# Patient Record
Sex: Male | Born: 1940 | Race: White | Hispanic: No | Marital: Married | Smoking: Former smoker
Health system: Southern US, Community
[De-identification: ages and names within clinical notes are randomized; demographics above are authoritative.]

## PROBLEM LIST (undated history)

## (undated) DIAGNOSIS — I639 Cerebral infarction, unspecified: Secondary | ICD-10-CM

## (undated) DIAGNOSIS — E785 Hyperlipidemia, unspecified: Secondary | ICD-10-CM

## (undated) DIAGNOSIS — C801 Malignant (primary) neoplasm, unspecified: Secondary | ICD-10-CM

## (undated) DIAGNOSIS — I4819 Other persistent atrial fibrillation: Secondary | ICD-10-CM

## (undated) DIAGNOSIS — J449 Chronic obstructive pulmonary disease, unspecified: Secondary | ICD-10-CM

## (undated) DIAGNOSIS — N400 Enlarged prostate without lower urinary tract symptoms: Secondary | ICD-10-CM

## (undated) DIAGNOSIS — K219 Gastro-esophageal reflux disease without esophagitis: Secondary | ICD-10-CM

## (undated) DIAGNOSIS — F419 Anxiety disorder, unspecified: Secondary | ICD-10-CM

## (undated) DIAGNOSIS — I779 Disorder of arteries and arterioles, unspecified: Secondary | ICD-10-CM

## (undated) DIAGNOSIS — L749 Eccrine sweat disorder, unspecified: Secondary | ICD-10-CM

## (undated) DIAGNOSIS — H02402 Unspecified ptosis of left eyelid: Secondary | ICD-10-CM

## (undated) DIAGNOSIS — R9430 Abnormal result of cardiovascular function study, unspecified: Secondary | ICD-10-CM

## (undated) DIAGNOSIS — H269 Unspecified cataract: Secondary | ICD-10-CM

## (undated) DIAGNOSIS — Z8719 Personal history of other diseases of the digestive system: Secondary | ICD-10-CM

## (undated) DIAGNOSIS — I2510 Atherosclerotic heart disease of native coronary artery without angina pectoris: Secondary | ICD-10-CM

## (undated) DIAGNOSIS — Z9989 Dependence on other enabling machines and devices: Secondary | ICD-10-CM

## (undated) DIAGNOSIS — I739 Peripheral vascular disease, unspecified: Secondary | ICD-10-CM

## (undated) DIAGNOSIS — IMO0002 Reserved for concepts with insufficient information to code with codable children: Secondary | ICD-10-CM

## (undated) DIAGNOSIS — J189 Pneumonia, unspecified organism: Secondary | ICD-10-CM

## (undated) DIAGNOSIS — E559 Vitamin D deficiency, unspecified: Secondary | ICD-10-CM

## (undated) HISTORY — DX: Benign prostatic hyperplasia without lower urinary tract symptoms: N40.0

## (undated) HISTORY — DX: Other persistent atrial fibrillation: I48.19

## (undated) HISTORY — PX: CATARACT EXTRACTION, BILATERAL: SHX1313

## (undated) HISTORY — DX: Atherosclerotic heart disease of native coronary artery without angina pectoris: I25.10

## (undated) HISTORY — DX: Vitamin D deficiency, unspecified: E55.9

## (undated) HISTORY — DX: Chronic obstructive pulmonary disease, unspecified: J44.9

## (undated) HISTORY — DX: Disorder of arteries and arterioles, unspecified: I77.9

## (undated) HISTORY — DX: Reserved for concepts with insufficient information to code with codable children: IMO0002

## (undated) HISTORY — DX: Abnormal result of cardiovascular function study, unspecified: R94.30

## (undated) HISTORY — DX: Unspecified ptosis of left eyelid: H02.402

## (undated) HISTORY — DX: Peripheral vascular disease, unspecified: I73.9

## (undated) HISTORY — DX: Personal history of other diseases of the digestive system: Z87.19

## (undated) HISTORY — DX: Cerebral infarction, unspecified: I63.9

## (undated) HISTORY — DX: Eccrine sweat disorder, unspecified: L74.9

## (undated) HISTORY — DX: Hyperlipidemia, unspecified: E78.5

## (undated) HISTORY — DX: Unspecified cataract: H26.9

## (undated) HISTORY — PX: APPENDECTOMY: SHX54

## (undated) HISTORY — DX: Anxiety disorder, unspecified: F41.9

## (undated) HISTORY — DX: Gastro-esophageal reflux disease without esophagitis: K21.9

---

## 2005-01-15 ENCOUNTER — Ambulatory Visit: Payer: Self-pay | Admitting: Internal Medicine

## 2005-01-23 ENCOUNTER — Ambulatory Visit: Payer: Self-pay | Admitting: Internal Medicine

## 2005-01-23 ENCOUNTER — Ambulatory Visit (HOSPITAL_COMMUNITY): Admission: RE | Admit: 2005-01-23 | Discharge: 2005-01-23 | Payer: Self-pay | Admitting: Internal Medicine

## 2007-12-11 ENCOUNTER — Ambulatory Visit: Payer: Self-pay | Admitting: Cardiology

## 2007-12-23 ENCOUNTER — Ambulatory Visit: Payer: Self-pay | Admitting: Cardiology

## 2007-12-23 ENCOUNTER — Encounter: Payer: Self-pay | Admitting: Cardiology

## 2008-01-01 ENCOUNTER — Ambulatory Visit: Payer: Self-pay | Admitting: Cardiology

## 2008-01-09 ENCOUNTER — Ambulatory Visit: Payer: Self-pay | Admitting: Cardiology

## 2008-01-15 ENCOUNTER — Ambulatory Visit: Payer: Self-pay | Admitting: Cardiology

## 2008-01-23 ENCOUNTER — Ambulatory Visit: Payer: Self-pay | Admitting: Cardiology

## 2008-01-27 ENCOUNTER — Ambulatory Visit: Payer: Self-pay | Admitting: Cardiology

## 2008-02-05 ENCOUNTER — Ambulatory Visit: Payer: Self-pay | Admitting: Cardiology

## 2008-02-11 ENCOUNTER — Ambulatory Visit: Payer: Self-pay | Admitting: Cardiology

## 2008-02-16 ENCOUNTER — Encounter: Payer: Self-pay | Admitting: Cardiology

## 2008-02-16 ENCOUNTER — Ambulatory Visit: Payer: Self-pay | Admitting: Cardiology

## 2008-02-17 ENCOUNTER — Encounter: Payer: Self-pay | Admitting: Cardiology

## 2008-02-20 ENCOUNTER — Ambulatory Visit: Payer: Self-pay | Admitting: Cardiology

## 2008-02-25 ENCOUNTER — Inpatient Hospital Stay (HOSPITAL_BASED_OUTPATIENT_CLINIC_OR_DEPARTMENT_OTHER): Admission: RE | Admit: 2008-02-25 | Discharge: 2008-02-25 | Payer: Self-pay | Admitting: Cardiovascular Disease

## 2008-02-25 ENCOUNTER — Ambulatory Visit: Payer: Self-pay | Admitting: Cardiovascular Disease

## 2008-03-02 ENCOUNTER — Ambulatory Visit: Payer: Self-pay | Admitting: Cardiology

## 2008-03-09 ENCOUNTER — Ambulatory Visit: Payer: Self-pay | Admitting: Cardiology

## 2008-03-23 ENCOUNTER — Ambulatory Visit: Payer: Self-pay | Admitting: Cardiology

## 2008-04-06 ENCOUNTER — Ambulatory Visit: Payer: Self-pay | Admitting: Cardiology

## 2008-04-14 ENCOUNTER — Ambulatory Visit: Payer: Self-pay | Admitting: Cardiology

## 2008-05-17 ENCOUNTER — Ambulatory Visit: Payer: Self-pay | Admitting: Cardiology

## 2008-10-04 ENCOUNTER — Encounter: Payer: Self-pay | Admitting: Physician Assistant

## 2008-10-04 ENCOUNTER — Encounter: Payer: Self-pay | Admitting: Cardiology

## 2008-11-30 ENCOUNTER — Ambulatory Visit: Payer: Self-pay | Admitting: Cardiology

## 2009-04-18 ENCOUNTER — Encounter: Payer: Self-pay | Admitting: Cardiology

## 2009-09-28 ENCOUNTER — Encounter: Payer: Self-pay | Admitting: Cardiology

## 2009-10-20 ENCOUNTER — Encounter: Payer: Self-pay | Admitting: Cardiology

## 2009-11-01 ENCOUNTER — Encounter: Payer: Self-pay | Admitting: Cardiology

## 2009-11-01 DIAGNOSIS — I4891 Unspecified atrial fibrillation: Secondary | ICD-10-CM | POA: Insufficient documentation

## 2009-11-01 DIAGNOSIS — K219 Gastro-esophageal reflux disease without esophagitis: Secondary | ICD-10-CM | POA: Insufficient documentation

## 2009-11-01 DIAGNOSIS — E785 Hyperlipidemia, unspecified: Secondary | ICD-10-CM

## 2009-11-01 HISTORY — DX: Hyperlipidemia, unspecified: E78.5

## 2009-11-02 ENCOUNTER — Ambulatory Visit: Payer: Self-pay | Admitting: Cardiology

## 2010-02-16 DIAGNOSIS — I639 Cerebral infarction, unspecified: Secondary | ICD-10-CM

## 2010-02-16 HISTORY — DX: Cerebral infarction, unspecified: I63.9

## 2010-03-06 ENCOUNTER — Ambulatory Visit: Payer: Self-pay | Admitting: Cardiology

## 2010-07-18 NOTE — Miscellaneous (Signed)
  Clinical Lists Changes  Problems: Added new problem of DYSLIPIDEMIA (ICD-272.4) Added new problem of GERD (ICD-530.81) Added new problem of * ANXIETY Added new problem of * PTOSIS  LEFT EYE Added new problem of ATRIAL FIBRILLATION, CHRONIC (ICD-427.31) Added new problem of CAD (ICD-414.00) Added new problem of * NIACIN REACTION Observations: Added new observation of PRIMARY MD: Wyvonnia Lora, MD (11/01/2009 18:25) Added new observation of PAST MED HX: hyperlipidemia.  Gastroesophageal reflux disease. Benign prostatic hypertrophy. anxiety disorder in the past. appendectomy. surgery for ptosis of the left eye. Status post bilateral cataracts. Atrial fibrillation.  He had been cardioverted on February 05, 2008     and did go to sinus rhythm, but returned to atrial fibrillation by     the time I saw him on February 20, 2008 EF  60%  range cardiac catheterization...2009... mild coronary artery disease. Episodes of sweating .Marland Kitchen...improved with discontinuation of niacin....Marland KitchenMarland Kitchen he has some type of delayed reactivity to niacin. Mild headaches that are frontal.   (11/01/2009 18:25)       Past History:  Past Medical History: hyperlipidemia.  Gastroesophageal reflux disease. Benign prostatic hypertrophy. anxiety disorder in the past. appendectomy. surgery for ptosis of the left eye. Status post bilateral cataracts. Atrial fibrillation.  He had been cardioverted on February 05, 2008     and did go to sinus rhythm, but returned to atrial fibrillation by     the time I saw him on February 20, 2008 EF  60%  range cardiac catheterization...2009... mild coronary artery disease. Episodes of sweating .Marland Kitchen...improved with discontinuation of niacin....Marland KitchenMarland Kitchen he has some type of delayed reactivity to niacin. Mild headaches that are frontal.

## 2010-07-18 NOTE — Assessment & Plan Note (Signed)
Summary: 1 YR FU - NEEDS TO UPDATE MEDICATION   Visit Type:  Follow-up Primary Provider:  Wyvonnia Lora, MD  CC:  atrial fibrillation.  History of Present Illness: The patient is seen for atrial fibrillation followup.  In the past he had been cardioverted.  He returned to atrial fibrillation and we decided to leave him in atrial fibrillation.  His rate is controlled.  He can be fully active when he wants to.  He does not have chest pain or shortness of breath.  His CHADS2 score is zero.  He is not on Coumadin.  In general he does well.  Some of his motivation to be active has decreased.  This does not sound cardiac in origin.  Preventive Screening-Counseling & Management  Alcohol-Tobacco     Smoking Status: quit     Year Quit: 1993  Current Medications (verified): 1)  Metoprolol Tartrate 50 Mg Tabs (Metoprolol Tartrate) .... Take 1 1/2 Tablet By Mouth Twice A Day 2)  Crestor 20 Mg Tabs (Rosuvastatin Calcium) .... Take One Tablet By Mouth Every Night. 3)  Caltrate 600 1500 Mg Tabs (Calcium Carbonate) .... Take 2 Tablet By Mouth Once A Day 4)  Vitamin D3 1000 Unit Caps (Cholecalciferol) .... Take 1 Tablet By Mouth Once A Day 5)  Aspir-Trin 325 Mg Tbec (Aspirin) .... Take 1 Tablet By Mouth Once A Day 6)  Multivitamins  Tabs (Multiple Vitamin) .... Take 1 Tablet By Mouth Once A Day 7)  Fish Oil 1000 Mg Caps (Omega-3 Fatty Acids) .... Take 2 Tablet By Mouth Once A Day 8)  Trazodone Hcl 50 Mg Tabs (Trazodone Hcl) .... Take 1 Tablet By Mouth Once A Day 9)  Zyrtec Allergy 10 Mg Caps (Cetirizine Hcl) .... Take 1 Tablet By Mouth Once A Day  Allergies: 1)  ! Niacin  Comments:  Nurse/Medical Assistant: The patient's medications and allergies were reviewed with the patient and were updated in the Medication and Allergy Lists. List reviewed.  Past History:  Past Medical History: Last updated: 11/01/2009 hyperlipidemia.  Gastroesophageal reflux disease. Benign prostatic  hypertrophy. anxiety disorder in the past. appendectomy. surgery for ptosis of the left eye. Status post bilateral cataracts. Atrial fibrillation.  He had been cardioverted on February 05, 2008     and did go to sinus rhythm, but returned to atrial fibrillation by     the time I saw him on February 20, 2008 EF  60%  range cardiac catheterization...2009... mild coronary artery disease. Episodes of sweating .Marland Kitchen...improved with discontinuation of niacin....Marland KitchenMarland Kitchen he has some type of delayed reactivity to niacin. Mild headaches that are frontal.   Social History: Smoking Status:  quit  Review of Systems       Patient denies fever, chills, headache, sweats, rash, change in vision, change in hearing, chest pain, cough, nausea vomiting, urinary symptoms.  All other systems are reviewed and are negative.  Vital Signs:  Patient profile:   70 year old male Height:      66 inches Weight:      175 pounds BMI:     28.35 Pulse rate:   96 / minute BP sitting:   145 / 81  (left arm) Cuff size:   regular  Vitals Entered By: Carlye Grippe (Nov 02, 2009 9:49 AM)  Nutrition Counseling: Patient's BMI is greater than 25 and therefore counseled on weight management options. CC: atrial fibrillation   Physical Exam  General:  patient is stable in general. Head:  head is atraumatic. Eyes:  no  xanthelasma. Neck:  no jugulovenous stenting.  No carotid bruits. Chest Wall:  no chest wall tenderness. Lungs:  lung exam reveals diminished breath sounds. Heart:  cardiac exam reveals an S1-S2.  The rhythm is irregularly irregular. Abdomen:  abdomen is soft. Msk:  no musculoskeletal deformities. Extremities:  no peripheral edema. Skin:  no skin rashes. Psych:  patient is oriented to person time and place.  Affect is normal.   Impression & Recommendations:  Problem # 1:  * NIACIN REACTION The patient had a significant reaction sweating to niacin in the past.  The niacin was stopped and he feels well.  He  does not have any medication now for niacin treatment  Problem # 2:  CAD (ICD-414.00)  His updated medication list for this problem includes:    Metoprolol Tartrate 50 Mg Tabs (Metoprolol tartrate) .Marland Kitchen... Take 1 1/2 tablet by mouth twice a day    Aspir-trin 325 Mg Tbec (Aspirin) .Marland Kitchen... Take 1 tablet by mouth once a day  Orders: EKG w/ Interpretation (93000) Patient has mild coronary disease by history.  EKG is done today and reviewed by me.  His atrial fib rate is mildly increased.  He has been out of his beta blocker for 2 days.  There are nonspecific ST-T wave changes.  No further workup is needed other than getting him back on his medications.  Problem # 3:  ATRIAL FIBRILLATION, CHRONIC (ICD-427.31) The patient's atrial fib rate will be controlled when he's back on his beta blocker.  I carefully reviewed again whether he should be on Coumadin.  His CHADS2 score is zero.  No Coumadin recommended at this time.  Problem # 4:  * ANXIETY The patient has decreased motivation at times.  There is a history of some anxiety.  I wonder if some mild depression.  He will followup with his primary team concerning this.  Problem # 5:  DYSLIPIDEMIA (ICD-272.4)  His updated medication list for this problem includes:    Crestor 20 Mg Tabs (Rosuvastatin calcium) .Marland Kitchen... Take one tablet by mouth every night. Patient's lipids are nicely controlled from the labs available to me.  No change in therapy.  Patient Instructions: 1)  Your physician wants you to follow-up in: 1 year. You will receive a reminder letter in the mail one-two months in advance. If you don't receive a letter, please call our office to schedule the follow-up appointment. 2)  Your physician recommends that you continue on your current medications as directed. Please refer to the Current Medication list given to you today. Prescriptions: METOPROLOL TARTRATE 50 MG TABS (METOPROLOL TARTRATE) Take 1 1/2 tablet by mouth twice a day  #90 Tablet x  12   Entered by:   Cyril Loosen, RN, BSN   Authorized by:   Talitha Givens, MD, [facility]   Signed by:   Cyril Loosen, RN, BSN on 11/02/2009   Method used:   Electronically to        [facility]* (retail)       [address]       [place], [place]  [address]       Ph: [phone]       Fax: [phone]   RxID:   [id number]

## 2010-07-18 NOTE — Letter (Signed)
Summary: Appointment- Rescheduled  [facility] [address]   [place], [place] [address]   Phone: [phone]  Fax: [phone]     Oct 20, 2009 MRN: [id number]     ZACCHEAUS STORLIE [address] [place], [place]  [address]     Dear Mr. Gow,   Due to a change in our office schedule, your appointment on  May 18th, 2011 at  3:00pm must be changed.   Your new appointment will be May 18th, 2011 at 10:00 am.  We look forward to participating in your health care needs.       Sincerely,  Glass blower/designer

## 2010-10-31 NOTE — Assessment & Plan Note (Signed)
[facility]                          [facility] OFFICE NOTE   Rayvon, Dakin JASTON HAVENS                      MRN:          [id number]  DATE:02/20/2008                            DOB:          [birth year]    Mr. Pagnotta is here post hospitalization.  I had followed him for atrial  fibrillation.  We had done a cardioversion on February 05, 2008 and he  converted.  The patient, however, was then hospitalized on February 17, 2008.  He had some left chest discomfort and was found to have returned  to atrial fib.  He remained in atrial fib.  His enzymes were negative.  Decision was made to proceed with nuclear exercise testing.  The nuclear  scan was done on February 17, 2008.  Study showed that the patient has  an inferior defect that was partially reversible.  Ejection fraction was  normal.  It was felt that this was compatible with some ischemia and  scar.   I have seen Mr. Hardgrove back today in the office.  He had been told we  would probably recommend catheterization and he is ready to go ahead.  I  had a full and complete discussion with the patient and his wife about  the risks and benefits of catheterization.  We know from before that  there was question that I thought he might have slight hypokinesis at  the base of the inferior wall.  With this in mind and with the more  recent events it is time to proceed with cardiac catheterization.   The patient has sweating episodes.  The etiology is not clear.  I am not  convinced this represents ischemia.  He has also had some headaches  which are frontal.  It would be unusual for this to be angina but not  impossible.   ALLERGIES:  No known drug allergies.   MEDICATIONS:  1. Niacin 500.  2. Vitamin D.  3. Calcium.  4. Aspirin 325 daily.  5. Trazodone.  6. Metoprolol 50 b.i.d.  7. Coumadin that has now been stopped.   OTHER MEDICAL PROBLEMS:  See the list below.   REVIEW OF SYSTEMS:  Other than  the HPI, his review of systems today is  negative.   PHYSICAL EXAM:  VITAL SIGNS:  His blood pressure is 115/72 with a pulse  of 88.  Weight is 181 pounds.  GENERAL:  The patient is oriented to person, time and place.  Affect is  normal.  HEENT:  Reveals no xanthelasma.  He has normal extraocular motion.  There are no carotid bruits.  There is no jugular venous distention.  LUNGS:  Lungs are clear.  Respiratory effort is not labored.  CARDIAC:  Exam reveals an S1 with an S2.  There are no clicks or  significant murmurs.  ABDOMEN:  The abdomen is soft.  There is no peripheral edema.   EKG today reveals that he still is in atrial fib.  His rate is  controlled at 82.  There are nonspecific ST-T wave changes.   PROBLEMS:  1. Include history of hyperlipidemia.  Up to this point there has been      question that he does not tolerate statins well.  This will have to      be rereviewed after we know more about his coronary arteries.  2. GERD.  3. BPH.  4. History of some anxiety disorder.  5. Status post appendectomy.  6. Status post surgery for ptosis of the left eye.  7. Status post bilateral cataracts.  8. Atrial fibrillation.  We had cardioverted him on 08/20 and he went      to sinus rhythm but returned to atrial fib.  We will address this      issue more in the future.  His CHADS score is low therefore it is      safe for him to be off Coumadin regardless.  9. History of ejection fraction in the 60% range.  10.Question of slight hypokinesis at the base of the inferior wall by      echo.  More recently he had some chest discomfort and the nuclear      scan raised the question of some scar or ischemia at the base of      the inferior wall.  It is now appropriate to proceed with      catheterization.  Catheterization will be arranged.  I will then      see him back in [place] so that we can further assess his issues.  11.Some episodes of sweating.  Exact etiology is not clear.  12.Mild  headaches that are frontal.  Etiology is not clear at this      point.   Catheterization is being arranged.     Luis Abed, MD, [facility]  Electronically Signed    JDK/MedQ  DD: 02/20/2008  DT: 02/20/2008  Job #: [id number]   cc:   Wyvonnia Lora

## 2010-10-31 NOTE — Cardiovascular Report (Signed)
[name], [name] NO.:  [id number]   MEDICAL RECORD NO.:  [id number]          PATIENT TYPE:  OIB   LOCATION:  1963                         FACILITY:  [facility]   PHYSICIAN:  Veverly Fells. Excell Seltzer, MD  DATE OF BIRTH:  Apr 11, 1941   DATE OF PROCEDURE:  02/25/2008  DATE OF DISCHARGE:  02/25/2008                            CARDIAC CATHETERIZATION   PROCEDURE:  Left heart catheterization, selective coronary angiography,  and left ventricular angiography.   INDICATIONS:  [name] is a 70 year old gentleman with chronic atrial  fibrillation.  He has failed recent cardioversion.  He complained of  left-sided chest pain and underwent an adenosine Myoview scan that  suggested inferior wall ischemia.  He was referred for cardiac  catheterization.   Risks and indications of the procedure were reviewed in detail and  informed consent was obtained.  The right groin was prepped, draped, and  anesthetized with 1% lidocaine.  Using modified Seldinger technique, a 4-  French sheath was placed in the right femoral artery.  Standard 4-French  Judkins catheters were used for coronary angiography and left  ventriculography.  All catheter exchanges were performed over a  guidewire.  There were no immediate complications.  The patient  tolerated the procedure well.   FINDINGS:  The aortic pressure 98/71 with a mean of 83, left ventricular  pressure 99/20.   Coronary angiography.  The left mainstem is very short.  There is no  stenosis present.   The LAD is a large-caliber vessel that courses down and reaches the LV  apex.  The anatomy is a bit unusual as there is a branch off the  proximal LAD that runs the course of an intermediate.  It supplies  multiple branch vessels to the lateral wall.  There has been more of a  traditional appearing diagonal branch from further down in the mid-LAD.  The LAD at the origin of the second diagonal branch has a 30% stenosis.  Further down in the  vessel, there is minor nonobstructive plaque but no  significant stenosis in the LAD or its branch vessels.  There are two  diagonals in the intermediate territory branch but all arising from the  proximal and mid LAD.   The left circumflex is very small.  It courses down the AV groove and  supplies no significant branch vessels.   Right coronary artery.  The right coronary artery is dominant.  Proximally, there is an eccentric 50% stenosis.  This is a smooth  appearing lesion of the vessels.  The midportion of the right coronary  artery is dilated.  There is a transition zone into more normal-  appearing vessel where there is a 30% stenosis.  There is a large acute  marginal branch and a moderate size PDA and posterolateral branch.  There are no other significant stenoses noted.  With intracoronary  nitroglycerin, the lesion still appears no worse than 50%.   Left ventriculography shows low normal LV function with an LVEF in the  range of 50-55%.  There was no significant mitral regurgitation.   ASSESSMENT:  1. Mild-to-moderate right coronary  artery stenosis as outlined above.  2. Minimal left anterior descending and left circumflex stenosis.  3. Normal left ventricular function.   PLAN:  Recommend medical therapy for nonobstructive CAD.  The patient  will resume Coumadin tonight.  He will drop his aspirin dose to 81 mg.  In reviewing Dr. Myrtis Ser notes, the patient may not require long-term  Coumadin.  Recommend aggressive secondary risk reduction for the  patient's diffuse nonobstructive CAD.      Veverly Fells. Excell Seltzer, MD  Electronically Signed     MDC/MEDQ  D:  02/25/2008  T:  02/26/2008  Job:  [id number]   cc:   Luis Abed, MD, [facility]  Wyvonnia Lora

## 2010-10-31 NOTE — Assessment & Plan Note (Signed)
[facility]                          [facility] OFFICE NOTE   Sean, [name]                      MRN:          [id number]  DATE:12/11/2007                            DOB:          1940-07-13    REFERRING PHYSICIAN:  Wyvonnia Lora, M.D.   REASON FOR REFERRAL:  Atrial fibrillation.   HISTORY OF PRESENT ILLNESS:  [name] is a 70 year old male patient  with no previously documented heart disease who presents to our office  today for further evaluation of atrial fibrillation.  He presented to  Dr. Jackolyn Confer office couple of months ago for routine physical  exanimation and was found to be in atrial fibrillation with the heart  rate in the 130s.  He was placed on Lopressor and Coumadin.  The patient  was totally asymptomatic with his atrial fibrillation.  He denies any  complications or shortness of breath.  He denies any presyncope or  syncope.  He denies orthopnea, PND, or pedal edema.  He denies any chest  discomfort.  He is quite active and describes NYHA class II  symptoms.   PAST MEDICAL HISTORY:  1. Hyperlipidemia.      a.     He was intolerant to Lipitor in the past as well as Zetia.       He does note that he was able to tolerate Zocor for quite       sometime, but had to change to Lipitor at one point secondary to       insurance reasons.  2. GERD.  3. BPH.  4. Anxiety disorder.  5. Status post appendectomy.  6. Status post surgery for ptosis of his left eye.  7. History of bilateral cataract surgery.   MEDICATIONS:  1. Niacin 500 mg daily.  2. Vitamin D 1000 IU daily.  3. Allergy relief 10 mg daily.  4. Calcium, magnesium and zinc daily.  5. Aspirin 81 mg daily.  6. Trazodone 50 mg nightly.  7. Metoprolol 50 mg b.i.d.  8. Warfarin as directed.  9. Prilosec p.r.n.   ALLERGIES:  No known drug allergies.   SOCIAL HISTORY:  The patient is an ex-smoker.  He smoked 2-1/2 pack to 4  packs of cigarettes per day for 46 years  and quit in 1993.  He did start  it when he was age 33.  He denies any significant alcohol abuse at this  time.  Denies drug abuse.  He is retired from trucking.  He is married  and has 5 children.   FAMILY HISTORY:  Insignificant for premature CAD.   REVIEW OF SYSTEMS:  Please see HPI.  Denies any fevers or chills.  He  did have a recent URI.  He denies any significant cough now.  He denies  melena, hematochezia, or hematuria.  Rest of the review of systems are  negative.   PHYSICAL EXAMINATION:  GENERAL:  He is a well-nourished, well-developed  male, in no acute distress.  VITAL SIGNS:  Blood pressure is 132/82, pulse 63, and weight 180.9  pounds.  HEENT:  Normal.  NECK:  Without JVD.  Lymph without lymphadenopathy.  Endocrine without  thyromegaly.  CARDIAC:  Normal S1 and S2.  Irregularly irregular rhythm.  LUNGS:  Clear to auscultation bilaterally.  ABDOMEN:  Soft and nontender with normoactive bowel sounds.  No  organomegaly.  No bruits.  EXTREMITIES:  Without edema.  Dorsalis pedis and posterior and posterior  tibialis pulses bilaterally.  NEUROLOGIC:  He is alert and oriented x3. Cranial nerves II-XII are  grossly intact.  BACK:  Without carotid bruits bilaterally.  SKIN:  Warm and dry.   STUDIES:  Electrocardiogram reveals atrial fibrillation with a heart  rate of 94, normal axis, no acute changes.   LABORATORY DATA:  TSH form October 14, 2007, 2.766.   IMPRESSION:  1. Atrial fibrillation.      a.     CHADS2 score of  0.      b.     Coumadin therapy followed by Dr. Margo Common.  2. Dyslipidemia.  3. Gastroesophageal reflux disease.  4. Ex-smoker.   PLAN:  1. The patient presents to the office today for further evaluation of      atrial fibrillation.  He is currently on Coumadin therapy.  His      CHADS2 score is  0.  Given his lack of thromboembolic risk factors,      he does not require long-term Coumadin.  However, it would be      worthwhile to attempt  cardioversion to restore normal sinus rhythm.      We will obtain his prior INRs and follow him over the next several      weeks to make sure he has therapeutic INRs for at least 3 weeks in      a row.  Once he completes that, we will set him up for an elective      cardioversion as an outpatient.  I discussed this with Dr. Myrtis Ser      also, who saw the patient today.  He agreed with this plan.  2. The patient will have an echocardiogram to assess cardiac structure      and function.  3. His metoprolol will be increased to 75 mg b.i.d. to achieve better      heart rate control.  4. The patient requests that he be placed back on statin therapy.  He      was able to tolerate Zocor in the past and would like to try      generic simvastatin.  We will place him on simvastatin 20 mg      nightly and recheck a lipid profile in 12 weeks.  5. The patient will be back for followup in the next 3-4 weeks just      prior to his planned cardioversion.      Sean Newcomer, PA-C  Electronically Signed      Luis Abed, MD, [facility]  Electronically Signed   SW/MedQ  DD: 12/11/2007  DT: 12/12/2007  Job #: [id number]   cc:   Wyvonnia Lora

## 2010-10-31 NOTE — Assessment & Plan Note (Signed)
[facility]                          [facility] NOTE   NAME:[name], [name]                      MRN:          [id number]  DATE:04/14/2008                            DOB:          06/29/1940    HISTORY OF PRESENT ILLNESS:  [name] is here for Cardiology  followup.  He has atrial fibrillation.  He also has hyperlipidemia.  We  were concerned about abnormal echo with some inferior hypokinesis that  was mild and a nuclear scan that was mildly abnormal and he underwent  catheterization.  The cath was done by Dr. Excell Seltzer, and it revealed only  mild-to-moderate atherosclerosis of the right coronary artery, but there  was no major obstructive lesion.  His ejection fraction was 55%.  He has  been stable.  He had had atrial fib, and we converted him to sinus, but  then he returned to atrial fib.  In the meantime, he has remained on  Coumadin.   Overall, he is doing well.  However, he describes episodes of sudden  onset of a feeling of warmth.  He then is sweaty for 2 or 3 minutes.  His wife states that he looks somewhat ashen, but he does not feel  poorly with it.  The episodes pass and he goes about his activities.  He  has had this before over many years.  He thinks it is more frequent now.  He does not sense any palpitations.  However, he has never had  significant palpitations previously from his atrial fib.   PAST MEDICAL HISTORY:   ALLERGIES:  No known drug allergies.   MEDICATIONS:  Vitamin D, calcium, aspirin, Loratadine, Niacin, and  Zocor.  Niacin dose was changed recently.  He has been on it in the  past.  It does not sound like the classic response, but we will start by  seeing how he feels off niacin.  He is on Zocor.   OTHER MEDICAL PROBLEMS:  See the list on my note of February 20, 2008.   REVIEW OF SYSTEMS:  He has no GI or GU symptoms.  Otherwise, his review  of systems is negative.   PHYSICAL EXAMINATION:  VITAL SIGNS:   Blood pressure is 116/64 with a  pulse of 84.  GENERAL:  The patient is oriented to person, time and place.  Affect is  normal.  HEENT:  No xanthelasma.  He has normal extraocular motion.  NECK:  There are no carotid bruits.  There is no jugular venous  distention.  LUNGS:  Clear.  Respiratory effort is not labored.  CARDIAC:  S1 with an S2.  The rhythm is irregular.  ABDOMEN:  Soft.  EXTREMITIES:  He has no peripheral edema.   PROBLEMS:  1. History of hyperlipidemia.  I am going to ask him to stop his      niacin to see how he feels.  He is on a statin.  2. Gastroesophageal reflux disease.  3. Benign prostatic hypertrophy.  4. History of an anxiety disorder in the past.  5. Status post appendectomy.  6. 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  7. Status post bilateral cataracts.  8. Atrial fibrillation.  He had been cardioverted on February 05, 2008      and did go to sinus rhythm, but returned to atrial fibrillation by      the time I saw him on February 20, 2008.  9. Coumadin therapy.  We are keeping him on Coumadin at this point.  10.History of ejection fraction in the 60% range.  11.Recent cardiac catheterization with mild coronary artery disease.  12.Episodes of sweating.  These are worse than the past.  We will      start by holding his niacin.  13.Mild headaches that are frontal.   First step would be to hold his niacin.  I will then see him back.  After that, we will consider event recorder to see if he has any marked  increase in his atrial fib rate when he has these symptoms.     Luis Abed, MD, [facility]  Electronically Signed    JDK/MedQ  DD: 04/14/2008  DT: 04/15/2008  Job #: [id number]   cc:   Wyvonnia Lora

## 2010-10-31 NOTE — Assessment & Plan Note (Signed)
[name]                          [facility] NOTE   [name], [name]                      MRN:          [id number]  DATE:03/09/2008                            DOB:          1941/01/17    PRIMARY CARDIOLOGIST:  Luis Abed, MD, [facility]   PRIMARY CARE PHYSICIAN:  Dr. Wyvonnia Lora   REASON FOR VISIT:  Post cardiac catheterization followup.   HISTORY OF PRESENT ILLNESS:  [name] was seen recently on September  4 by Dr. Myrtis Ser and was scheduled for a diagnostic cardiac catheterization  following a recent mildly abnormal Cardiolite.  This study was performed  in early September in the setting of recurrent atrial fibrillation  following an initially successful cardioversion attempt and also dyspnea  on exertion.  He had had an echocardiogram done previously demonstrated  an ejection fraction of 55-60% with basal inferolateral hypokinesis.  His Cardiolite study was abnormal in similar distribution revealing a  partially reversible inferior defect with abnormal ST-segment changes  and an ejection fraction of 67%.  [name] was taken off of Coumadin  and referred for cardiac catheterization done by Dr. Excell Seltzer which  revealed mild-to-moderate atherosclerosis within the right coronary  artery, although no major obstructive lesion was seen.  Otherwise, there  was minimal atherosclerosis and an overall normal ejection fraction of  50-55% with no significant mitral regurgitation.   [name] comes into the office today for followup in Dr. Myrtis Ser  absence.  He denies having any anginal chest pain.  I tried to get a  sense about his symptoms following cardioversion and he seems to relate  some improvement in dyspnea on exertion just following his restoration  of sinus rhythm, although he has been in persistent atrial fibrillation  since early September and seems to be tolerating it reasonably well.  He  does have a mild sense of dyspnea on  exertion, NYHA class II, but no  major sense of palpitations.  He also seems to be tolerating Zocor which  was started back in May despite a prior intolerance to other statin  medications.  He has not had followup lipid profile or liver function  tests as yet.  At the present time, he continues on Coumadin with low-  dose aspirin and has had no major bleeding problems.   ALLERGIES:  No known drug allergies.   PRESENT MEDICATIONS:  1. Loratadine 10 mg p.o. daily.  2. Aspirin 81 mg p.o. daily.  3. Zocor 20 mg p.o. nightly.  4. Lopressor 75 mg p.o. b.i.d.  5. Coumadin as directed by the [facility].  6. Trazodone 50 mg p.o. nightly.  7. Calcium supplements.  8. Niacin 500 mg p.o. daily.  9. Vitamin D 1000 international units daily.   REVIEW OF SYSTEMS:  As per history of present illness.  Otherwise  negative.   PHYSICAL EXAMINATION:  VITAL SIGNS:  Blood pressure is 135/86, heart  rate is 84, weight is 183 pounds.  GENERAL:  The patient is comfortable in no acute distress.  HEENT:  Conjunctiva is normal.  Oropharynx is clear.  NECK:  Supple.  No elevated jugular venous pressure.  No audible bruits.  No thyromegaly is noted.  LUNGS:  Clear without labored breathing at rest.  CARDIAC:  An irregularly irregular rhythm without S3 gallop.  ABDOMEN:  Soft, nontender, and nondistended with normoactive bowel  sounds.  EXTREMITIES:  Exhibit no cyanosis or pitting edema.  Distal pulses are 1  to 2+.  SKIN:  Warm and dry.  MUSCULOSKELETAL:  No evidence of kyphosis.  NEUROPSYCHIATRIC:  The patient is alert and oriented x3.  Affect is  appropriate.   Electrocardiogram shows atrial fibrillation with nonspecific T-wave  changes, heart rate of 85 beats per minute.   IMPRESSION AND RECOMMENDATIONS:  1. Persistent atrial fibrillation with reasonably controlled heart      rate and seemingly non-limiting symptoms at this time.  [name]      is status post cardioversion attempt done in  late August which was      initially successful.  He continues on low-dose aspirin and      Coumadin at this point.  Options would include leaving him in      atrial fibrillation with a strategy of heart rate control and      anticoagulation as appropriate.  His CHAD2 score was deemed to be 0      and therefore it may well be that he could be taken off of Coumadin      long-term and placed on full dose aspirin.  Alternatively,      antiarrhythmic therapy with a repeat attempt at cardioversion, or      perhaps even down the road an attempt at ablation could be      considered.  Although based on his symptom status at this time, I      certainly did not feel strongly about these options.  I reviewed      these issues with the patient and I will plan to have him come back      to see Dr. Myrtis Ser, his primary cardiologist, to formalize the plan      going forward.  I did not interrupt his Coumadin until this      decision is made.  2. History of hyperlipidemia and mild-to-moderate cardiovascular      disease by cardiac catheterization as outlined above.  Fortunately,      [name] is tolerating generic Zocor.  I will arrange followup      lipid profile and liver function tests.     Jonelle Sidle, MD  Electronically Signed    SGM/MedQ  DD: 03/09/2008  DT: 03/09/2008  Job #: [id number]   cc:   Luis Abed, MD, [facility]  Wyvonnia Lora

## 2010-10-31 NOTE — Assessment & Plan Note (Signed)
[facility]                          [facility] NOTE   [name], [name]                      MRN:          [id number]  DATE:05/17/2008                            DOB:          01-06-1941    [name] is seen for followup.  I saw him on April 14, 2008.  He is  doing well.  He had been having unusual spells of sweating episodes.  He  feels warm and sweaty.  He looks ashen.  He feels poorly with that.  Etiology was not completely clear.  It did not sound like a classic  reaction to niacin.  However, we put his niacin on hold and he has felt  fine ever since.  Therefore, it appears that he is intolerant to NIACIN.   Otherwise, he feels great.  He is not having any significant problems.   It is of note that he is in atrial fibrillation.  I had thought that we  still had him on Coumadin, but in fact it was stopped earlier in the  year.  This was done around the time that we were doing other  evaluations.  I have rereviewed his CHADS score.  We do not have an  absolute indication to restart it, and we will keep him off it for now.  However, when I see him back next visit we will talk about it again.   ALLERGIES:  NIACIN intolerance.   MEDICATIONS:  Zocor, Lopressor, calcium, vitamin D, aspirin to be taken  as 325 mg daily, and loratadine.   OTHER MEDICAL PROBLEMS:  See the complete list on my note of April 14, 2008.   REVIEW OF SYSTEMS:  He is not having any fevers or chills.  He is not  having any GI or GU symptoms.  He has no major muscular aches or pains.  His review of systems otherwise is negative.   PHYSICAL EXAMINATION:  Blood pressure is 137/79.  His pulse is 66.  The  patient is oriented to person, time, and place.  Affect is normal.  HEENT reveals no xanthelasma.  He has normal extraocular motion.  There  are no carotid bruits.  There is no jugular venous distention.  Lungs  are clear.  Respiratory effort is not  labored.  Cardiac exam reveals an  S1 with an S2.  There are no clicks or significant murmurs.  The abdomen  is soft.  He has no peripheral edema.   EKG reveals his atrial fib rate is controlled.   Problems are listed on the note of April 14, 2008.  He is doing well.  It appears that his major symptom from before was related to niacin with  some type of delayed niacin sensitivity.  He will remain off niacin.   I will keep him off the Coumadin at this point.  However, we will  rereview it when I see him back in 6 months.     Luis Abed, MD, [facility]  Electronically Signed    JDK/MedQ  DD: 05/17/2008  DT: 05/18/2008  Job #: [id number]   cc:  Wyvonnia Lora

## 2010-10-31 NOTE — Assessment & Plan Note (Signed)
[facility]                          [facility] NOTE   [name], Follette VANDY TSUCHIYA                      MRN:          [id number]  DATE:11/30/2008                            DOB:          [birth year]    Mr. Korber returns for followup.  He is doing very well.  He had had an  unusual reaction to NIACIN.  He had sweating episodes that seemed quite  unusual, but off the niacin he is doing very well.  He is not having any  chest pain.  He has chronic atrial fibrillation.  After a very careful  review, we decided to keep him off Coumadin as he does not have absolute  indications for this.  He is on aspirin.  We will continue to follow  this issue carefully over time.  He is not having any chest pain.   PAST MEDICAL HISTORY:   ALLERGIES:  He does not tolerate NIACIN.   MEDICATIONS:  Lopressor, calcium, vitamin D, aspirin, omega-3,  trazodone, and Crestor.   OTHER MEDICAL PROBLEMS:  See the list on the note of April 14, 2008.   REVIEW OF SYSTEMS:  He has no fevers or chills.  There are no skin  rashes.  He has mild headaches.  There is no change in his vision.  There is no change in his hearing.  There is no cough.  There is no  chest pain or shortness of breath.  He has no GI or GU symptoms.  All  other systems are reviewed and are negative.   PHYSICAL EXAMINATION:  VITAL SIGNS:  Weight is 173 pounds.  Blood  pressure is 127/83 with a pulse of 65.  GENERAL:  The patient is oriented to person, time, and place.  Affect is  normal.  HEENT:  No xanthelasma.  He has normal extraocular motion.  NECK:  There are no carotid bruits.  There is no jugular venous  distention.  LUNGS:  Clear.  Respiratory effort is not labored.  CARDIAC:  S1 with an S2.  There are no clicks or significant murmurs.  ABDOMEN:  Soft.  EXTREMITIES:  He has no peripheral edema.   PROBLEMS:  Listed on the note of April 14, 2008.  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  #15.  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  He asked if he could eat  garlic for this.  I told him I was not aware that I did not know the  literature on this.  It is not recommended from a cardiovascular  viewpoint.     Luis Abed, MD, [facility]  Electronically Signed    JDK/MedQ  DD: 11/30/2008  DT: 12/01/2008  Job #: [id number]   cc:   Wyvonnia Lora

## 2010-10-31 NOTE — Assessment & Plan Note (Signed)
[facility]                          [facility]   [name], [name]                      MRN:          [id number]  DATE:01/01/2008                            DOB:          Mar 11, 1941    [name] is here for followup.  He has atrial fibrillation.  His [place]  score is 0.  However, we have had him on Coumadin to get him ready for  cardioversion.  I need to recheck the labs, but I believe he has been  well treated and we will look into the timing of when his cardioversion  can be done.   He is not having chest pain or shortness of breath.  He is having  symptoms of vertigo.  He has not had any syncope or presyncope.   PAST MEDICAL HISTORY:   ALLERGIES:  No known drug allergies.   MEDICATIONS:  Niacin, vitamin D, aspirin, trazodone, metoprolol and  warfarin.  This has been overseen by Dr. Margo Common.  We do have many of the  results.   OTHER MEDICAL PROBLEMS:  See the list below.   REVIEW OF SYSTEMS:  As per the HPI.  His vertigo is the major issue.  Otherwise, he is stable.   PHYSICAL EXAMINATION:  VITAL SIGNS:  Blood pressure today is 130/85.  His pulse is 74.  Weight is 179 pounds.  GENERAL:  The patient is oriented to person, time and place.  Affect is  normal.  He is here with his wife today.  HEENT:  No xanthelasma.  He has normal extraocular motion.  NECK:  There are no carotid bruits.  There is no jugular venous  distention.  LUNGS:  Clear.  Respiratory effort is not labored.  CARDIAC:  An S1 with an S2.  The rhythm is irregularly irregular.  ABDOMEN:  Soft.  EXTREMITIES:  He has no significant peripheral edema.   His EKG reveals atrial fibrillation.  When bringing the patient from a  lying to sitting position, he has some dizziness compatible with  vertigo.   PROBLEMS:  1. History of hyperlipidemia.  He is not able to tolerate many      medicines.  2. Gastroesophageal reflux disease.  3. Benign prostatic  hypertrophy.  4. History of an anxiety disorder.  5. Status post appendectomy.  6. Status post surgery for ptosis of the left eye.  7. History of bilateral cataract surgery.  8. Atrial fibrillation.  9. Coumadin therapy, as he is being prepared for his cardioversion.  10.[place] score of 0.  11.2-D echo revealing ejection fraction 55-60%.  12.Question of slight hypokinesis of the base of the inferior wall.      This will be kept in mind.  I do not feel that more aggressive      workup is needed for this at this time.   We will be rechecking his Coumadin status and seeing what we can  provide, proceed with cardioversion.  Also, I will allow him to take  some meclizine at night time to help with his dizziness.     Sean Abed, MD, [facility]  Electronically Signed    JDK/MedQ  DD: 01/01/2008  DT: 01/02/2008  Job #: [id number]   cc:   Wyvonnia Lora

## 2010-11-03 ENCOUNTER — Other Ambulatory Visit: Payer: Self-pay | Admitting: Cardiology

## 2010-11-03 NOTE — Op Note (Signed)
Sean, [name] NO.:  [id number]   MEDICAL RECORD NO.:  [id number]          PATIENT TYPE:  AMB   LOCATION:  DAY                           FACILITY:  [facility]   PHYSICIAN:  Lionel December, M.D.    DATE OF BIRTH:  01-11-1941   DATE OF PROCEDURE:  01/23/2005  DATE OF DISCHARGE:                                 OPERATIVE REPORT   PROCEDURE:  Esophagogastroduodenoscopy.   INDICATIONS:  [name] is a 70 year old Caucasian male with a history of  heartburn for over 40 years who has finally responded to PPI. He also was  treated for H pylori gastritis recently. Given chronicity of his symptoms,  we recommended EGD to make sure he does not have complicated GERD. Procedure  risks were reviewed with the patient, and informed consent was obtained.   PREMEDICATION:  Cetacaine spray for pharyngeal topical anesthesia, Demerol  25 mg IV, Versed 3 mg IV in divided dose.   FINDINGS:  Procedure performed in endoscopy suite. The patient's vital signs  and O2 saturation were monitored during the procedure remained stable. The  patient was placed in left lateral position, and Olympus videoscope was  passed oropharynx without any difficulty into esophagus.   Esophagus. Mucosa of the esophagus was normal at proximal middle segment.  Distally, there was a linear ulcer about 15 mm long and appeared to be  healing. There was another  area with healed ulcer and underlying erythema.  There was scarring and pseudodiverticular formation at distal esophagus just  proximal to GE junction. GE junction was at 34 cm and hiatus was a 37. He  had a relatively short esophagus. There was no stricture or changes to  suggest Barrett's mucosa. Hiatus was at 37. His esophagus was felt to be  rather short.   Stomach. It was empty and distended very well insufflation. Folds of  proximal stomach were normal. Examination mucosa at body, antrum, pyloric  channel as well as angularis, fundus and cardia was  normal.   Duodenum. Bulbar mucosa was normal. Scope was passed to the second part of  the duodenum. Large diverticula were noted along the medial wall, filled  with food debris. Endoscope was withdrawn. The patient tolerated the  procedure well.   FINAL DIAGNOSIS:  1.  Single ulcer at distal esophagus. There is scarring and      pseudodiverticular formation indicative of chronic gastroesophageal      reflux disease. No evidence of Barrett's esophagus.  2.  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  3.  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   RECOMMENDATIONS:  1.  Antireflux measures reinforced.  2.  Given endoscopic finding, he needs to be on chronic PPI therapy. He will      stay on Nexium at 40 mg daily      before breakfast.  3.  He will continue follow-up with Dr. Margo Common as planned.  4.  Should he  develop dysphagia, we will be glad to see him again.       NR/MEDQ  D:  01/23/2005  T:  01/23/2005  Job:  [id number]

## 2010-11-03 NOTE — Telephone Encounter (Signed)
[facility] pt. 

## 2010-11-03 NOTE — H&P (Signed)
NAME:  JONH, MCQUEARY NO.:  [id number]   MEDICAL RECORD NO.:  [id number]          PATIENT TYPE:  AMB   LOCATION:                                FACILITY:  [facility]   PHYSICIAN:  Lionel December, M.D.    DATE OF BIRTH:  [birth year]   DATE OF ADMISSION:  DATE OF DISCHARGE:  LH                                HISTORY & PHYSICAL   PHYSICIAN SIGNING NOTE:  Lionel December, M.D. and R. Roetta Sessions, M.D.   PRIMARY CARE PHYSICIAN:  Wyvonnia Lora, M.D.   CHIEF COMPLAINT:  EGD for chronic GERD.   HISTORY OF PRESENT ILLNESS:  The patient is a 70 year old Caucasian male  with history of chronic GERD.  He was seen in our office back in October  2003 with abdominal bleed and intermittent GERD symptoms.  He was started on  PPI at that time.  About two months ago he presented to Dr. Jackolyn Confer office  with severe heartburn.  He was given a trial of Nexium 40 mg daily as well  as Pepcid to take intermittently.  He noted good relief of his symptoms with  PPI.  He was also given Reglan and not sure what dose.  He was found to be  H. pylori positive and completed a 14-day PredPak course.  Most all of his  symptoms have resolved since completion of the PredPak.  He did have  abdominal bloating, distention, indigestion, and frequent belching.  He also  had regurgitation of his foods and water brash.  Certain foods certainly  exacerbate his symptoms including onions.  He denies any dysphagia or  odynophagia.  He denies any anorexia or early satiety.  His bowel movements  have generally been every 3 or 4 days without rectal bleeding or melena.   PAST MEDICAL HISTORY:  1.  Chronic GERD.  2.  Muscle pain.  3.  Hypercholesterolemia.  4.  Colonoscopy January 2006 per Dr. Marlene Bast which was normal.  5.  Appendectomy.  6.  Bilateral cataract surgery.  7.  Left eye ptosis.   CURRENT MEDICATIONS:  1.  Nexium 40 mg daily.  2.  Trazodone q.h.s.   ALLERGIES:  No known drug allergies.   FAMILY  HISTORY:  No history of colorectal carcinoma or recurrent GI  problems.  Both parents are deceased.  He has one healthy sister.   SOCIAL HISTORY:  The patient currently has been married for 36 years. He has  five grown healthy children.  He is retired.  He reports a 50+-pack-year  history of tobacco use, quitting about 15 years ago.  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  Denies any drug use.   REVIEW OF SYSTEMS:  CONSTITUTIONAL:  Weight is stable.  Denies fever or  chills.  Denies insomnia.  CARDIOVASCULAR;  Denies chest pain or  palpitations. PULMONARY:  Denies shortness of breath, dyspnea, cough,  hemoptysis. GI:  See HPI.   PHYSICAL EXAMINATION:  VITAL SIGNS: Weight 108 pounds, height 58 inches.  Temperature 98,  blood pressure 142/78, pulse 62.  GENERAL:  The patient is an elderly Caucasian male who is alert and  pleasant, in no acute distress.  He is accompanied by his wife today.  HEENT:  Sclerae clear, nonicteric.  Conjunctivae pink.  Oropharynx without  lesion.  NECK:  Supple without mass or thyromegaly.  CHEST:  Regular rate and rhythm with normal S1 and S2, rubs, gallops. Lungs  clear to auscultation bilaterally.  ABDOMEN:  Positive bowel sounds x 4.  No bruits auscultated.  Soft,  nontender, nondistended, without palpable mass.  Liver is palpable 2  fingerbreadths below the right costal margin. Unable to palpate splenomegaly  or guarding.  RECTAL:  Deferred.  EXTREMITIES:  Without edema bilaterally.  SKIN:  Pink, warm, dry, without rash or jaundice.   IMPRESSION:  The patient is a 70 year old Caucasian male with chronic  gastroesophageal reflux disease.  About two months ago, he had poorly  controlled heartburn.  This has responded well to Nexium 40 mg daily as well  as treatment for positive Helicobacter pylori serology with PredPak.  Nevertheless, given his three-year history of chronic  gastroesophageal  reflux disease symptoms and history of alcohol use, I feel he should undergo  a screening esophagogastroduodenoscopy at this time to rule out complicating  gastroesophageal reflux disease including development of Barrett's  esophagus.   RECOMMENDATIONS:  1.  EGD with Dr. Karilyn Cota in the near future.  I have discussed the procedure      including risks and benefits to include but not limited to bleeding,      infection, perforation, drug reaction.  He agrees with this plan, and      consent will be obtained.  2.  He is to continue Nexium 40 mg daily, and I have given him two weeks'      worth of samples.  3.  Further recommendations pending EGD.       KC/MEDQ  D:  01/15/2005  T:  01/15/2005  Job:  [id number]

## 2010-11-28 ENCOUNTER — Encounter: Payer: Self-pay | Admitting: Cardiology

## 2010-12-06 ENCOUNTER — Other Ambulatory Visit: Payer: Self-pay | Admitting: *Deleted

## 2010-12-06 MED ORDER — METOPROLOL TARTRATE 50 MG PO TABS: 75.0000 mg | ORAL_TABLET | Freq: Two times a day (BID) | ORAL | Status: DC

## 2010-12-18 ENCOUNTER — Encounter: Payer: Self-pay | Admitting: Cardiology

## 2010-12-19 ENCOUNTER — Ambulatory Visit (INDEPENDENT_AMBULATORY_CARE_PROVIDER_SITE_OTHER): Payer: Medicare Other | Admitting: Cardiology

## 2010-12-19 ENCOUNTER — Encounter: Payer: Self-pay | Admitting: Cardiology

## 2010-12-19 DIAGNOSIS — I739 Peripheral vascular disease, unspecified: Secondary | ICD-10-CM

## 2010-12-19 DIAGNOSIS — I779 Disorder of arteries and arterioles, unspecified: Secondary | ICD-10-CM

## 2010-12-19 DIAGNOSIS — I639 Cerebral infarction, unspecified: Secondary | ICD-10-CM

## 2010-12-19 DIAGNOSIS — I2510 Atherosclerotic heart disease of native coronary artery without angina pectoris: Secondary | ICD-10-CM

## 2010-12-19 DIAGNOSIS — I4891 Unspecified atrial fibrillation: Secondary | ICD-10-CM

## 2010-12-19 DIAGNOSIS — R9430 Abnormal result of cardiovascular function study, unspecified: Secondary | ICD-10-CM | POA: Insufficient documentation

## 2010-12-19 DIAGNOSIS — I6350 Cerebral infarction due to unspecified occlusion or stenosis of unspecified cerebral artery: Secondary | ICD-10-CM

## 2010-12-19 MED ORDER — METOPROLOL TARTRATE 50 MG PO TABS: 75.0000 mg | ORAL_TABLET | Freq: Two times a day (BID) | ORAL | Status: DC

## 2010-12-19 NOTE — Assessment & Plan Note (Signed)
Fortunately he has had complete resolution of the symptoms from his CVA.  Coumadin is to be continued.

## 2010-12-19 NOTE — Patient Instructions (Signed)
Continue all current medications. Your physician wants you to follow up in:  1 year.  You will receive a reminder letter in the mail one-two months in advance.  If you don't receive a letter, please call our office to schedule the follow up appointment   

## 2010-12-19 NOTE — Assessment & Plan Note (Signed)
Coronary disease is mild by history.  He is not having any significant symptoms.  No further workup at this time.

## 2010-12-19 NOTE — Assessment & Plan Note (Signed)
Atrial fibrillation rate is controlled.  In the past he did not have indications for Coumadin therapy.  With a neurologic event that he is having now qualifies and he is on Coumadin very carefully.  He is very reliable witness.

## 2010-12-19 NOTE — Progress Notes (Signed)
HPI Patient is seen for cardiology followup.  He has known atrial fibrillation.  I saw him last May, 2011.  He was hospitalized in September, 2011.  He had a CVA with resolution of the symptoms.  MRI revealed an abnormality.  It was presumed that it was related to atrial fibrillation and he was placed on Coumadin.  He has been very reliable with his Coumadin since then.  I reviewed the hospital discharge summary.  I reviewed the 2-D echo report and the MRI report and the 2-D echo report.  Patient continues to have good left ventricular function.  He did not have any severe carotid artery stenoses.  He has not been having any chest pain or shortness of breath. Allergies  Allergen Reactions  . Niacin     REACTION: hot flashes    Current Outpatient Prescriptions  Medication Sig Dispense Refill  . Calcium Carbonate (CALTRATE 600) 1500 MG TABS Take 2 tablets by mouth Nightly.       . Cetirizine HCl (ZYRTEC ALLERGY) 10 MG CAPS Take 1 capsule by mouth daily.       . Cholecalciferol (VITAMIN D3) 1000 UNITS CAPS Take by mouth daily.        . metoprolol (LOPRESSOR) 50 MG tablet Take 1.5 tablets (75 mg total) by mouth 2 (two) times daily. PATIENT NEED OFFICE VISIT  90 tablet  0  . Omega-3 Fatty Acids (FISH OIL) 1200 MG CAPS Take 2 capsules by mouth daily.        . rosuvastatin (CRESTOR) 20 MG tablet Take 20 mg by mouth daily.        Marland Kitchen warfarin (COUMADIN) 4 MG tablet Take 1 tablet by mouth Daily. Per Dr. Margo Common office      . DISCONTD: aspirin (ASPIR-TRIN) 325 MG EC tablet Take 325 mg by mouth daily.        Marland Kitchen DISCONTD: Multiple Vitamins-Minerals (MULTIVITAL) tablet Take 1 tablet by mouth daily.        Marland Kitchen DISCONTD: Omega-3 Fatty Acids (FISH OIL) 1000 MG CAPS Take by mouth daily. Take 2 tabs       . DISCONTD: traZODone (DESYREL) 50 MG tablet Take 50 mg by mouth daily.          History   Social History  . Marital Status: Married    Spouse Name: N/A    Number of Children: N/A  . Years of Education: N/A     Occupational History  . Not on file.   Social History Main Topics  . Smoking status: Former Smoker -- 3.0 packs/day for 50 years    Types: Cigarettes    Quit date: 03/18/1990  . Smokeless tobacco: Former Neurosurgeon    Types: Chew    Quit date: 06/18/2000  . Alcohol Use: Not on file  . Drug Use: Not on file  . Sexually Active: Not on file   Other Topics Concern  . Not on file   Social History Narrative   Quit smoking in 1993. No significant alcohol abuse. Retired from trucking.     No family history on file.  Past Medical History  Diagnosis Date  . Hyperlipidemia   . Gastroesophageal reflux disease   . Benign prostatic hypertrophy   . Anxiety disorder   . S/P appendectomy   . Bilateral cataracts   . Atrial fibrillation     Cardioverted in the past /  atrial fib return... decided rate control..CHADS 2  score is  0.... no Coumadin  . Ptosis of eyelid, left  Surgery in the past  . Ejection fraction     EF 60%  . CAD (coronary artery disease)     Catheterization 2009, mild CAD  . Sweating abnormality     Episodes of sweating appeared to be related to a delayed reaction to niacin  . CVA (cerebral infarction)     Hospitalization.  MRI...    Left anterior cerebral artery territory, September, 2011... Coumadin started  . Ejection fraction     EF 55-60%, echo, September, 2011  . Carotid artery disease     Doppler, September, 2011, less than 50% bilateral    No past surgical history on file.  ROS  Patient denies fever, chills, headache, sweats, rash, change in vision, change in hearing, chest pain, cough, nausea vomiting, urinary symptoms.  All other systems are reviewed and are negative.   PHYSICAL EXAM Patient is here with his wife today.  He is oriented to person time and place.  Head is atraumatic.  There is no jugular venous distention.  Lungs are clear.  Respiratory effort is unlabored.  Cardiac exam reveals S1-S2.  No clicks or significant murmurs.  The abdomen is  soft.  There is no peripheral edema.  There is no musculoskeletal deformities.  There are no skin rashes. Filed Vitals:   12/19/10 1108  BP: 152/79  Pulse: 63  Height: 5\' 6"  (1.676 m)  Weight: 176 lb (79.833 kg)    EKG Is not done today.  I have reviewed the tracing from the hospital from September.  ASSESSMENT & PLAN

## 2010-12-19 NOTE — Assessment & Plan Note (Signed)
He does have mild carotid artery disease.  This will be followed over time.

## 2011-02-15 ENCOUNTER — Other Ambulatory Visit: Payer: Self-pay | Admitting: Cardiology

## 2011-03-21 LAB — PROTIME-INR
INR: 1
Prothrombin Time: 13.2

## 2012-01-01 ENCOUNTER — Ambulatory Visit: Payer: Medicare Other | Admitting: Cardiology

## 2012-01-03 ENCOUNTER — Other Ambulatory Visit: Payer: Self-pay | Admitting: Cardiology

## 2012-03-07 ENCOUNTER — Encounter: Payer: Self-pay | Admitting: Cardiology

## 2012-03-07 ENCOUNTER — Ambulatory Visit (INDEPENDENT_AMBULATORY_CARE_PROVIDER_SITE_OTHER): Payer: Medicare Other | Admitting: Cardiology

## 2012-03-07 VITALS — BP 124/78 | HR 68 | Ht 68.0 in | Wt 176.1 lb

## 2012-03-07 DIAGNOSIS — I2510 Atherosclerotic heart disease of native coronary artery without angina pectoris: Secondary | ICD-10-CM

## 2012-03-07 DIAGNOSIS — I779 Disorder of arteries and arterioles, unspecified: Secondary | ICD-10-CM

## 2012-03-07 DIAGNOSIS — I639 Cerebral infarction, unspecified: Secondary | ICD-10-CM

## 2012-03-07 DIAGNOSIS — E785 Hyperlipidemia, unspecified: Secondary | ICD-10-CM

## 2012-03-07 DIAGNOSIS — I482 Chronic atrial fibrillation, unspecified: Secondary | ICD-10-CM

## 2012-03-07 DIAGNOSIS — R9430 Abnormal result of cardiovascular function study, unspecified: Secondary | ICD-10-CM

## 2012-03-07 DIAGNOSIS — I6350 Cerebral infarction due to unspecified occlusion or stenosis of unspecified cerebral artery: Secondary | ICD-10-CM

## 2012-03-07 DIAGNOSIS — R0989 Other specified symptoms and signs involving the circulatory and respiratory systems: Secondary | ICD-10-CM

## 2012-03-07 DIAGNOSIS — I4891 Unspecified atrial fibrillation: Secondary | ICD-10-CM

## 2012-03-07 MED ORDER — METOPROLOL TARTRATE 50 MG PO TABS: 75.0000 mg | ORAL_TABLET | Freq: Two times a day (BID) | ORAL | Status: DC

## 2012-03-07 NOTE — Progress Notes (Signed)
HPI  Patient is seen for followup atrial fibrillation. He has minimal coronary disease. He did have a CVA in the past. It was presumed related to his atrial fib and he has been on Coumadin since. He has good left her to function. He's feeling well. His atrial fibrillation is controlled.    Allergies  Allergen Reactions  . Niacin     REACTION: hot flashes    Current Outpatient Prescriptions  Medication Sig Dispense Refill  . Calcium Carbonate (CALTRATE 600) 1500 MG TABS Take 2 tablets by mouth Nightly.       . Cetirizine HCl (ZYRTEC ALLERGY) 10 MG CAPS Take 1 capsule by mouth daily.       . Cholecalciferol (VITAMIN D3) 1000 UNITS CAPS Take 2,000 Units by mouth daily.       . CRESTOR 20 MG tablet TAKE ONE TABLET BY MOUTH EVERY NIGHT.  90 tablet  6  . metoprolol (LOPRESSOR) 50 MG tablet TAKE 1 & 1/2 TABLETS (75 MG) BY MOUTH TWICE DAILY  270 tablet  0  . Omega-3 Fatty Acids (FISH OIL) 1200 MG CAPS Take 2 capsules by mouth daily.        Marland Kitchen warfarin (COUMADIN) 4 MG tablet Take 1 tablet by mouth Daily. Per Dr. Margo Common office        History   Social History  . Marital Status: Married    Spouse Name: N/A    Number of Children: N/A  . Years of Education: N/A   Occupational History  . Not on file.   Social History Main Topics  . Smoking status: Former Smoker -- 3.0 packs/day for 50 years    Types: Cigarettes    Quit date: 03/18/1990  . Smokeless tobacco: Former Neurosurgeon    Types: Chew    Quit date: 06/18/2000  . Alcohol Use: Not on file  . Drug Use: Not on file  . Sexually Active: Not on file   Other Topics Concern  . Not on file   Social History Narrative   Quit smoking in 1993. No significant alcohol abuse. Retired from trucking.     No family history on file.  Past Medical History  Diagnosis Date  . Hyperlipidemia   . Gastroesophageal reflux disease   . Benign prostatic hypertrophy   . Anxiety disorder   . S/P appendectomy   . Bilateral cataracts   . Atrial  fibrillation     Cardioverted in the past /  atrial fib return... decided rate control..CHADS 2  score is  0.... no Coumadin  . Ptosis of eyelid, left     Surgery in the past  . Ejection fraction     EF 60%  . CAD (coronary artery disease)     Catheterization 2009, mild CAD  . Sweating abnormality     Episodes of sweating appeared to be related to a delayed reaction to niacin  . CVA (cerebral infarction)     Hospitalization.  MRI...    Left anterior cerebral artery territory, September, 2011... Coumadin started  . Ejection fraction     EF 55-60%, echo, September, 2011  . Carotid artery disease     Doppler, September, 2011, less than 50% bilateral    No past surgical history on file.  ROS   Patient denies fever, chills, headache, sweats, rash, change in vision, change in hearing, chest pain, cough, nausea vomiting, urinary symptoms. All other systems are reviewed and are negative.  PHYSICAL EXAM  Patient is oriented to person time and  place. Affect is normal. He's here with his wife. Lungs are clear. Respiratory effort is nonlabored. There is no jugulovenous distention. Cardiac exam reveals S1 and S2. The rhythm is irregularly irregular. The abdomen is soft. There is no peripheral edema. There no musculoskeletal deformities. There are no skin rashes.  Filed Vitals:   03/07/12 1036  BP: 124/78  Pulse: 68  Height: 5\' 8"  (1.727 m)  Weight: 176 lb 1.9 oz (79.888 kg)   EKG is done today and reviewed by me. He has atrial fib with a controlled rate. There are nonspecific ST changes. No significant change.  ASSESSMENT & PLAN

## 2012-03-07 NOTE — Assessment & Plan Note (Signed)
Patient has chronic atrial fibrillation. The rate is controlled on his current medicines. He is on Coumadin. No change in therapy.

## 2012-03-07 NOTE — Patient Instructions (Addendum)

## 2012-03-07 NOTE — Assessment & Plan Note (Signed)
The patient is on Crestor. This is expensive for him. He could not tolerate Lipitor in the past. I doubt that we can get adequate control for him with simvastatin. He does have a very low HDL. I've asked him to speak with his primary physician about his most recent labs. However unless we feel we can get reasonably good control I would leave him on Crestor

## 2012-03-07 NOTE — Assessment & Plan Note (Signed)
Patient recovered completely from the CVA that was felt related to atrial fib emboli. He stable on Coumadin and completely recovered.

## 2012-03-07 NOTE — Assessment & Plan Note (Signed)
Patient had mild carotid disease in the past. He'll need a followup Doppler in the future.

## 2012-03-07 NOTE — Assessment & Plan Note (Signed)
We know that he has good LV function from 2011. He does not need echo at this time.

## 2012-03-07 NOTE — Assessment & Plan Note (Signed)
Coronary disease is stable. No change in therapy. See him back in one year for followup.

## 2012-03-13 ENCOUNTER — Other Ambulatory Visit: Payer: Self-pay | Admitting: Cardiology

## 2012-07-18 ENCOUNTER — Other Ambulatory Visit: Payer: Self-pay | Admitting: Cardiology

## 2012-07-18 MED ORDER — ROSUVASTATIN CALCIUM 20 MG PO TABS: 20.0000 mg | ORAL_TABLET | Freq: Every day | ORAL | Status: DC

## 2012-07-18 MED ORDER — METOPROLOL TARTRATE 50 MG PO TABS: 75.0000 mg | ORAL_TABLET | Freq: Two times a day (BID) | ORAL | Status: DC

## 2012-07-18 NOTE — Telephone Encounter (Signed)
Needs all prescriptions sent to [facility]  Member ID [id number] Patient changed insurance and the company will not call and get old scripts from pharmacy  Fax  [phone]

## 2012-12-18 ENCOUNTER — Encounter: Payer: Self-pay | Admitting: Internal Medicine

## 2012-12-18 ENCOUNTER — Ambulatory Visit (INDEPENDENT_AMBULATORY_CARE_PROVIDER_SITE_OTHER): Payer: Medicare Other | Admitting: Internal Medicine

## 2012-12-18 VITALS — BP 104/60 | HR 64 | Temp 97.9°F | Ht 64.5 in | Wt 176.0 lb

## 2012-12-18 DIAGNOSIS — R918 Other nonspecific abnormal finding of lung field: Secondary | ICD-10-CM

## 2012-12-18 NOTE — Progress Notes (Signed)
  Subjective:    Patient ID: [name], male    DOB: [birth year]   MRN: [id number]  HPI  72 yowm quit smoking 1991 with cough resolved then recurred [place] [id number] treated as pna and symptoms since then of recurrent pattern spring and fall cough assoc with nasal symptoms completely resolved  After zpak and prednisone then this episode Dec 2013 never completely resolved and had CT chest 12/09/12 with as dz so referred 12/18/2012 to pulmonary by Dr Margo Common p starting levaquin 12/15/12   12/18/2012 1st pulmonary ov/ Jimmylee Ratterree cc persistant daily x 6 months variable sometimes worse in am's with mucus variably green. Assoc with mild doe but not adls   No obvious pattern daytime variabilty or assoc chronic cough or cp or chest tightness, subjective wheeze overt sinus or hb symptoms. No unusual exp hx or h/o childhood pna/ asthma or knowledge of premature birth.   Current Medications, Allergies, Past Medical History, Past Surgical History, Family History, and Social History were reviewed in [employer] record.  ROS  The following are not active complaints unless bolded sore throat, dysphagia, dental problems, itching, sneezing,  nasal congestion or excess/ purulent secretions, ear ache,   fever, chills, sweats, unintended wt loss, pleuritic or exertional cp, hemoptysis,  orthopnea pnd or leg swelling, presyncope, palpitations, heartburn, abdominal pain, anorexia, nausea, vomiting, diarrhea  or change in bowel or urinary habits, change in stools or urine, dysuria,hematuria,  rash, arthralgias, visual complaints, headache, numbness weakness or ataxia or problems with walking or coordination,  change in mood/affect or memory.      Review of Systems  Constitutional: Negative for fever, chills, activity change, appetite change and unexpected weight change.  HENT: Negative for congestion, sore throat, rhinorrhea, sneezing, trouble swallowing, dental problem, voice change and postnasal drip.   Eyes:  Negative for visual disturbance.  Respiratory: Positive for shortness of breath. Negative for cough and choking.   Cardiovascular: Negative for chest pain and leg swelling.  Gastrointestinal: Negative for nausea, vomiting and abdominal pain.  Genitourinary: Negative for difficulty urinating.  Musculoskeletal: Negative for arthralgias.  Skin: Negative for rash.  Psychiatric/Behavioral: Negative for behavioral problems and confusion.       Objective:   Physical Exam  Pleasant amb wm nad Wt Readings from Last 3 Encounters:  12/18/12 176 lb (79.833 kg)  03/07/12 176 lb 1.9 oz (79.888 kg)  12/19/10 176 lb (79.833 kg)     HEENT mild turbinate edema.  Oropharynx no thrush or excess pnd or cobblestoning.  No JVD or cervical adenopathy. Mild accessory muscle hypertrophy. Trachea midline, nl thryroid. Chest was hyperinflated by percussion with diminished breath sounds and moderate increased exp time without wheeze. Hoover sign positive at mid inspiration. Regular rate and rhythm without murmur gallop or rub or increase P2 or edema.  Abd: no hsm, nl excursion. Ext warm without cyanosis or clubbing.      CT 11/2412 with as dz Lingula and LUL      Assessment & Plan:

## 2012-12-18 NOTE — Patient Instructions (Addendum)
Finish levaquin but have inr checked on Monday July 7  Stop fish oil  GERD (REFLUX)  is an extremely common cause of respiratory symptoms, many times with no significant heartburn at all.    It can be treated with medication, but also with lifestyle changes including avoidance of late meals, excessive alcohol, smoking cessation, and avoid fatty foods, chocolate, peppermint, colas, red wine, and acidic juices such as orange juice.  NO MINT OR MENTHOL PRODUCTS SO NO COUGH DROPS  USE SUGARLESS CANDY INSTEAD (jolley ranchers or Stover's)  NO OIL BASED VITAMINS - use powdered substitutes.   For cough ok to try delsym cough syrup or mucindex dm up to 1200 every 12 hours   Please schedule a follow up office visit in 4-6 weeks, sooner if needed with pft's and cxr

## 2012-12-19 DIAGNOSIS — R918 Other nonspecific abnormal finding of lung field: Secondary | ICD-10-CM | POA: Insufficient documentation

## 2012-12-19 NOTE — Assessment & Plan Note (Addendum)
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 This area is viz on plain cxr and can be followed conservatively.  Also need to keep in mind ? Underlying sinus dz in this setting ? Needs sinus CT at some point to exlcude but for now just add mucinex dm and f/u with plain cxr's serralily  The other area is "microscopic" as not not seen on plain cxr and  may just be inflammatory so should resolve with levaquin but will need f/u ct to exclude Bronchoalv Ca (placed in our tickle file)  The other concern is lipoid pna > try off fish oil/ gerd diet

## 2013-01-21 ENCOUNTER — Ambulatory Visit (INDEPENDENT_AMBULATORY_CARE_PROVIDER_SITE_OTHER)
Admission: RE | Admit: 2013-01-21 | Discharge: 2013-01-21 | Disposition: A | Discharge status: Home / Self Care | Payer: Medicare Other | Source: Ambulatory Visit | Attending: Internal Medicine | Admitting: Internal Medicine

## 2013-01-21 ENCOUNTER — Encounter: Payer: Self-pay | Admitting: Internal Medicine

## 2013-01-21 ENCOUNTER — Ambulatory Visit (INDEPENDENT_AMBULATORY_CARE_PROVIDER_SITE_OTHER): Payer: Medicare Other | Admitting: Internal Medicine

## 2013-01-21 VITALS — BP 124/60 | HR 75 | Temp 97.1°F | Ht 65.0 in | Wt 176.0 lb

## 2013-01-21 DIAGNOSIS — R918 Other nonspecific abnormal finding of lung field: Secondary | ICD-10-CM

## 2013-01-21 DIAGNOSIS — J449 Chronic obstructive pulmonary disease, unspecified: Secondary | ICD-10-CM | POA: Insufficient documentation

## 2013-01-21 LAB — PULMONARY FUNCTION TEST

## 2013-01-21 MED ORDER — BUDESONIDE-FORMOTEROL FUMARATE 160-4.5 MCG/ACT IN AERO: INHALATION_SPRAY | RESPIRATORY_TRACT | Status: DC

## 2013-01-21 NOTE — Progress Notes (Signed)
  Subjective:    Patient ID: [name], male    DOB: 08/10/1940   MRN: [id number]  Brief patient profile:  77 yowm quit smoking 1991 with cough resolved then recurred in [id number] treated as pna and symptoms since then of recurrent pattern spring and fall cough assoc with nasal symptoms completely resolved  After zpak and prednisone then this episode Dec 2013 never completely resolved and had CT chest 12/09/12 with as dz so referred 12/18/2012 to pulmonary by Dr Margo Common p starting levaquin 12/15/12. 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 Followup with PFT and CXR    Pt states breathing has improved some since the last visit. No new co's today.  Still has variable doe and sense of chest congestion in ams but no purulent sputum.  No obvious pattern to daytime variabilty or assoc   cp or chest tightness, subjective wheeze overt sinus or hb symptoms. No unusual exp hx or h/o childhood pna/ asthma or knowledge of premature birth.   Current Medications, Allergies, Past Medical History, Past Surgical History, Family History, and Social History were reviewed in [employer] record.  ROS  The following are not active complaints unless bolded sore throat, dysphagia, dental problems, itching, sneezing,  nasal congestion or excess/ purulent secretions, ear ache,   fever, chills, sweats, unintended wt loss, pleuritic or exertional cp, hemoptysis,  orthopnea pnd or leg swelling, presyncope, palpitations, heartburn, abdominal pain,  anorexia, nausea, vomiting, diarrhea  or change in bowel or urinary habits, change in stools or urine, dysuria,hematuria,  rash, arthralgias, visual complaints, headache, numbness weakness or ataxia or problems with walking or coordination,  change in mood/affect or memory.             Objective:   Physical Exam  Pleasant amb wm nad  01/21/2013         01/21/2013    12/18/12 176 lb (79.833 kg)  03/07/12 176 lb 1.9 oz (79.888 kg)  12/19/10 176 lb (79.833 kg)     HEENT mild turbinate edema.  Oropharynx no thrush or excess pnd or cobblestoning.  No JVD or cervical adenopathy. Mild accessory muscle hypertrophy. Trachea midline, nl thryroid. Chest was hyperinflated by percussion with diminished breath sounds and moderate increased exp time without wheeze. Hoover sign positive at mid inspiration. Regular rate and rhythm without murmur gallop or rub or increase P2 or edema.  Abd: no hsm, nl excursion. Ext warm without cyanosis or clubbing.      CT 11/2412 with as dz Lingula and LUL  cxr 01/21/13 No acute cardiopulmonary disease.  Interval resolution of the previously identified subtle lingular opacity.       Assessment & Plan:

## 2013-01-21 NOTE — Progress Notes (Signed)
PFT done. Jennifer Castillo, CMA  

## 2013-01-21 NOTE — Patient Instructions (Addendum)
Symbicort 160 mg Take 2 puffs first thing in am and then another 2 puffs about 12 hours later.   Work on inhaler technique:  relax and gently blow all the way out then take a nice smooth deep breath back in, triggering the inhaler at same time you start breathing in.  Hold for up to 5 seconds if you can.  Rinse and gargle with water when done     Please schedule a follow up office visit in 6 weeks, call sooner if needed

## 2013-01-22 ENCOUNTER — Telehealth: Payer: Self-pay | Admitting: Internal Medicine

## 2013-01-22 NOTE — Telephone Encounter (Signed)
Pt states he is once again returning the call to get XRay results.  Antionette Fairy

## 2013-01-22 NOTE — Assessment & Plan Note (Signed)
-   PFT's FEV1 1.10 (44%) ratio 54 and 41% reversibilty  DLCO 69 corrects to 99%   The proper method of use, as well as anticipated side effects, of a metered-dose inhaler are discussed and demonstrated to the patient. Improved effectiveness after extensive coaching during this visit to a level of approximately  75% so based on a sign AB component and ongoing variable symptoms rec symbicort 2 bid     Each maintenance medication was reviewed in detail including most importantly the difference between maintenance and as needed and under what circumstances the prns are to be used.  Please see instructions for details which were reviewed in writing and the patient given a copy.

## 2013-01-22 NOTE — Telephone Encounter (Signed)
Notes Recorded by Christen Butter, CMA on 01/22/2013 at 11:28 AM [facility] ------  Notes Recorded by [name], MD on 01/22/2013 at 7:59 AM Call pt: Reviewed cxr and radiology was able to review all the previous studies and the conclusion is that the process that resulted in the CT chest was clearly seen on the original plain cxr and the f/u cxr 8/6 shows clearing so no need for more CT's at this point  Pt advised. Carron Curie, CMA

## 2013-01-22 NOTE — Assessment & Plan Note (Signed)
-   See CT [facility] 12/09/12 Lingula and LUL x 12 mm gg changes (tickle file for 03/11/13)  Plain cxr 01/21/13  complete resolution of infiltrates so f/u ct is optional and probably not necessary.

## 2013-01-30 ENCOUNTER — Encounter: Payer: Self-pay | Admitting: Internal Medicine

## 2013-02-27 ENCOUNTER — Ambulatory Visit (INDEPENDENT_AMBULATORY_CARE_PROVIDER_SITE_OTHER): Payer: Medicare Other | Admitting: Cardiology

## 2013-02-27 ENCOUNTER — Encounter: Payer: Self-pay | Admitting: Cardiology

## 2013-02-27 VITALS — BP 139/81 | HR 70 | Ht 66.0 in | Wt 177.0 lb

## 2013-02-27 DIAGNOSIS — I779 Disorder of arteries and arterioles, unspecified: Secondary | ICD-10-CM

## 2013-02-27 DIAGNOSIS — I4891 Unspecified atrial fibrillation: Secondary | ICD-10-CM

## 2013-02-27 DIAGNOSIS — Z7901 Long term (current) use of anticoagulants: Secondary | ICD-10-CM

## 2013-02-27 NOTE — Assessment & Plan Note (Signed)
There is prominent atrial fibrillation. The rate is controlled. He is anticoagulated. No further workup.

## 2013-02-27 NOTE — Assessment & Plan Note (Signed)
Patient is anticoagulated with Coumadin. I had a careful discussion with him and his wife about Coumadin and the newer agents. He would like to remain on Coumadin

## 2013-02-27 NOTE — Patient Instructions (Addendum)
Your physician recommends that you schedule a follow-up appointment in: 1 year. You will receive a reminder letter in the mail in about 10 months reminding you to call and schedule your appointment. If you don't receive this letter, please contact our office. Your physician recommends that you continue on your current medications as directed. Please refer to the Current Medication list given to you today. Your physician has requested that you have a carotid duplex. This test is an ultrasound of the carotid arteries in your neck. It looks at blood flow through these arteries that supply the brain with blood. Allow one hour for this exam. There are no restrictions or special instructions.  

## 2013-02-27 NOTE — Progress Notes (Signed)
HPI  Patient is seen today to followup atrial fibrillation. He has minimal coronary disease. He did have a CVA in the past. It is presumed related to his atrial fib. He is well anticoagulated. He does have some carotid artery disease.  Allergies  Allergen Reactions  . Asa [Aspirin]     GI upset  . Niacin     REACTION: hot flashes    Current Outpatient Prescriptions  Medication Sig Dispense Refill  . budesonide-formoterol (SYMBICORT) 160-4.5 MCG/ACT inhaler Inhale 2 puffs into the lungs every morning.      . Calcium Carbonate (CALTRATE 600) 1500 MG TABS Take 2 tablets by mouth Nightly.       . Cetirizine HCl (ZYRTEC ALLERGY) 10 MG CAPS Take 1 capsule by mouth daily.       . Cholecalciferol (VITAMIN D3) 1000 UNITS CAPS Take 2,000 Units by mouth daily.       . rosuvastatin (CRESTOR) 20 MG tablet Take 1 tablet (20 mg total) by mouth daily.  90 tablet  3  . traZODone (DESYREL) 50 MG tablet Take 2 tablets by mouth at bedtime as needed.       . warfarin (COUMADIN) 4 MG tablet Take 1 tablet by mouth Daily. Per Dr. Margo Common office       No current facility-administered medications for this visit.    History   Social History  . Marital Status: Married    Spouse Name: N/A    Number of Children: N/A  . Years of Education: N/A   Occupational History  . Retired [employer]     Social History Main Topics  . Smoking status: Former Smoker -- 3.00 packs/day for 50 years    Types: Cigarettes    Quit date: 03/18/1990  . Smokeless tobacco: Former Neurosurgeon    Types: Chew    Quit date: 06/18/2000  . Alcohol Use: No  . Drug Use: No  . Sexual Activity: Not on file   Other Topics Concern  . Not on file   Social History Narrative   Quit smoking in 1993. No significant alcohol abuse. Retired from trucking.     No family history on file.  Past Medical History  Diagnosis Date  . Hyperlipidemia   . Gastroesophageal reflux disease   . Benign prostatic hypertrophy   . Anxiety disorder     . S/P appendectomy   . Bilateral cataracts   . Atrial fibrillation     Cardioverted in the past /  atrial fib return... decided rate control..CHADS 2  score is  0.... no Coumadin  . Ptosis of eyelid, left     Surgery in the past  . Ejection fraction     EF 60%  . CAD (coronary artery disease)     Catheterization 2009, mild CAD  . Sweating abnormality     Episodes of sweating appeared to be related to a delayed reaction to niacin  . CVA (cerebral infarction)     Hospitalization.  MRI...    Left anterior cerebral artery territory, September, 2011... Coumadin started  . Ejection fraction     EF 55-60%, echo, September, 2011  . Carotid artery disease     Doppler, September, 2011, less than 50% bilateral    History reviewed. No pertinent past surgical history.  Patient Active Problem List   Diagnosis Date Noted  . 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 Pulmonary infiltrates 12/19/2012  . Ejection fraction   . Carotid artery disease   .  CVA (cerebral infarction)   . CAD (coronary artery disease)   . DYSLIPIDEMIA 11/01/2009  . ATRIAL FIBRILLATION, CHRONIC 11/01/2009  . GERD 11/01/2009    ROS   Patient denies fever, chills, headache, sweats, rash, change in vision, change in hearing, chest pain, cough, nausea vomiting, urinary symptoms. All other systems are reviewed and are negative.  PHYSICAL EXAM  Patient is oriented to person time and place. Affect is normal. There is no jugulovenous distention. Lungs are clear. Respiratory effort is nonlabored. Cardiac exam rales S1 and S2. There no clicks or significant murmurs. The abdomen is soft. There is no peripheral edema.  Filed Vitals:   02/27/13 1432  BP: 139/81  Pulse: 70  Height: 5\' 6"  (1.676 m)  Weight: 177 lb (80.287 kg)  SpO2: 94%   EKG is done today and reviewed by me. He has old atrial fibrillation. The rate is controlled.  ASSESSMENT & PLAN

## 2013-02-27 NOTE — Assessment & Plan Note (Signed)
He does have some carotid artery disease. His last Doppler was September, 2011. It is time for followup Doppler in this will be scheduled.

## 2013-03-09 ENCOUNTER — Ambulatory Visit (INDEPENDENT_AMBULATORY_CARE_PROVIDER_SITE_OTHER): Payer: Medicare Other | Admitting: Internal Medicine

## 2013-03-09 ENCOUNTER — Encounter: Payer: Self-pay | Admitting: Internal Medicine

## 2013-03-09 VITALS — BP 130/82 | HR 74 | Temp 97.0°F | Ht 66.0 in | Wt 179.0 lb

## 2013-03-09 DIAGNOSIS — R918 Other nonspecific abnormal finding of lung field: Secondary | ICD-10-CM

## 2013-03-09 DIAGNOSIS — J449 Chronic obstructive pulmonary disease, unspecified: Secondary | ICD-10-CM

## 2013-03-09 MED ORDER — ALBUTEROL SULFATE HFA 108 (90 BASE) MCG/ACT IN AERS: 2.0000 | INHALATION_SPRAY | RESPIRATORY_TRACT | Status: DC | PRN

## 2013-03-09 NOTE — Patient Instructions (Addendum)
Symbicort 160 mg Take 2 puffs first thing in am and then another 2 puffs about 12 hours later (optional)  Work on perfecting  inhaler technique:  relax and gently blow all the way out then take a nice smooth deep breath back in, triggering the inhaler at same time you start breathing in.  Hold for up to 5 seconds if you can.  Rinse and gargle with water when done     Only use your albuterol(proaire)  as a rescue medication to be used if you can't catch your breath by resting or doing a relaxed purse lip breathing pattern.  - The less you use it, the better it will work when you need it. - Ok to use up to every 4 hours if you must but call for immediate appointment if use goes up over your usual need - Don't leave home without it !!  (think of it like your spare tire for your car)    If you are satisfied with your treatment plan let your doctor know and he/she can either refill your medications or you can return here when your prescription runs out.     If in any way you are not 100% satisfied,  please tell us.  If 100% better, tell your friends!

## 2013-03-09 NOTE — Progress Notes (Signed)
Subjective:    Patient ID: [name], male    DOB: [birth year]   MRN: [id number]  Brief patient profile:  31 yowm quit smoking 1991 with cough resolved then recurred in [address] treated as pna and symptoms since then of recurrent pattern spring and fall cough assoc with nasal symptoms completely resolved  After zpak and prednisone then this episode Dec 2013 never completely resolved and had CT chest 12/09/12 with as dz so referred 12/18/2012 to pulmonary by Dr Margo Common p starting levaquin 12/15/12. Proved to have GOLD II COPD by pft's 01/21/13  With 41% reversibility    HPI 12/18/2012 1st pulmonary ov/ Shannin Naab cc persistent daily x 6 months variable sometimes worse in am's with mucus variably green. Assoc with mild doe but not adls  rec Finish levaquin but have inr checked on Monday July 7 Stop fish oil GERD  For cough ok to try delsym cough syrup or mucindex dm up to 1200 every 12 hours    01/21/2013 f/u ov/Jishnu Jenniges GOLD II COPD With 41% reversibility Chief Complaint  Patient presents with  . Followup with PFT and CXR    Pt states breathing has improved some since the last visit. No new co's today.  Still has variable doe and sense of chest congestion in ams but no purulent sputum. rec Symbicort 160 mg Take 2 puffs first thing in am and then another 2 puffs about 12 hours later.  Work on inhaler technique  03/09/2013 f/u ov/Jaz Laningham re: copd Chief Complaint  Patient presents with  . Follow-up    Pt states his breathing continues to improve, and he denies any new co's today.    no limiting sob, reare need for saba  hafa   No obvious pattern to daytime variabilty or assoc cough  cp or chest tightness, subjective wheeze overt sinus or hb symptoms. No unusual exp hx or h/o childhood pna/ asthma or knowledge of premature birth.   Current Medications, Allergies, Past Medical History, Past Surgical History, Family History, and Social History were reviewed in [employer]  record.  ROS  The following are not active complaints unless bolded sore throat, dysphagia, dental problems, itching, sneezing,  nasal congestion or excess/ purulent secretions, ear ache,   fever, chills, sweats, unintended wt loss, pleuritic or exertional cp, hemoptysis,  orthopnea pnd or leg swelling, presyncope, palpitations, heartburn, abdominal pain, anorexia, nausea, vomiting, diarrhea  or change in bowel or urinary habits, change in stools or urine, dysuria,hematuria,  rash, arthralgias, visual complaints, headache, numbness weakness or ataxia or problems with walking or coordination,  change in mood/affect or memory.             Objective:   Physical Exam  Pleasant amb wm nad  Wt Readings from Last 3 Encounters:  03/09/13 179 lb (81.194 kg)  02/27/13 177 lb (80.287 kg)  01/21/13 176 lb (79.833 kg)       HEENT mild turbinate edema.  Oropharynx no thrush or excess pnd or cobblestoning.  No JVD or cervical adenopathy. Mild accessory muscle hypertrophy. Trachea midline, nl thryroid. Chest was hyperinflated by percussion with diminished breath sounds and moderate increased exp time without wheeze. Hoover sign positive at mid inspiration. Regular rate and rhythm without murmur gallop or rub or increase P2 or edema.  Abd: no hsm, nl excursion. Ext warm without cyanosis or clubbing.      CT 11/2412 with as dz Lingula and LUL  cxr 01/21/13 No acute cardiopulmonary disease.  Interval resolution of the  previously identified subtle lingular opacity.       Assessment & Plan:   Outpatient Encounter Prescriptions as of 03/09/2013  Medication Sig Dispense Refill  . budesonide-formoterol (SYMBICORT) 160-4.5 MCG/ACT inhaler Inhale 2 puffs into the lungs every morning.      . Calcium Carbonate (CALTRATE 600) 1500 MG TABS Take 2 tablets by mouth Nightly.       . Cetirizine HCl (ZYRTEC ALLERGY) 10 MG CAPS Take 1 capsule by mouth daily.       . Cholecalciferol (VITAMIN D3) 1000 UNITS CAPS  Take 2,000 Units by mouth daily.       . metoprolol (LOPRESSOR) 50 MG tablet 1 1/2 tablets every am and 1 1/2 tablets every pm      . rosuvastatin (CRESTOR) 20 MG tablet Take 1 tablet (20 mg total) by mouth daily.  90 tablet  3  . traZODone (DESYREL) 50 MG tablet Take 2 tablets by mouth at bedtime as needed.       . warfarin (COUMADIN) 4 MG tablet Take 1 tablet by mouth Daily. Per Dr. Margo Common office      . albuterol (PROAIR HFA) 108 (90 BASE) MCG/ACT inhaler Inhale 2 puffs into the lungs every 4 (four) hours as needed for wheezing. 2 puffs every 4 hours as needed only  if your can't catch your breath  1 Inhaler  1   No facility-administered encounter medications on file as of 03/09/2013.

## 2013-03-11 NOTE — Assessment & Plan Note (Addendum)
-   PFT's FEV1 1.10 (44%) ratio 54 and 41% reversibilty  DLCO 69 corrects to 99%   The proper method of use, as well as anticipated side effects, of a metered-dose inhaler are discussed and demonstrated to the patient. Improved effectiveness after extensive coaching during this visit to a level of approximately 90%    Adequate control on present rx, reviewed > no change in rx needed    I had an extended discussion summary  with the patient today lasting 15 to 20 minutes of a 25 minute visit on the following issues:     Each maintenance medication was reviewed in detail including most importantly the difference between maintenance and as needed and under what circumstances the prns are to be used.  Please see instructions for details which were reviewed in writing and the patient given a copy.

## 2013-03-11 NOTE — Assessment & Plan Note (Signed)
-   See CT [facility] 12/09/12 Lingula and LUL x 12 mm gg changes (tickle file for 03/11/13)  Plain cxr 01/21/13  complete resolution of infiltrates so f/u ct is optional and probably not necessary.  Since changes apparent on plain cxr and have resolved no need to repeat.

## 2013-03-12 ENCOUNTER — Encounter (INDEPENDENT_AMBULATORY_CARE_PROVIDER_SITE_OTHER): Payer: Medicare Other

## 2013-03-12 DIAGNOSIS — I6529 Occlusion and stenosis of unspecified carotid artery: Secondary | ICD-10-CM

## 2013-03-12 DIAGNOSIS — I779 Disorder of arteries and arterioles, unspecified: Secondary | ICD-10-CM

## 2013-03-19 ENCOUNTER — Encounter: Payer: Self-pay | Admitting: Cardiology

## 2013-03-20 ENCOUNTER — Telehealth: Payer: Self-pay | Admitting: *Deleted

## 2013-03-20 NOTE — Telephone Encounter (Signed)
Message copied by Eustace Moore on Fri Mar 20, 2013  4:09 PM ------      Message from: Myrtis Ser, [place] D      Created: Thu Mar 19, 2013  3:27 PM       Please let him know that his carotid Doppler is stable. ------

## 2013-03-20 NOTE — Telephone Encounter (Signed)
Patient informed. 

## 2013-09-03 ENCOUNTER — Telehealth: Payer: Self-pay | Admitting: Cardiology

## 2013-09-03 MED ORDER — METOPROLOL TARTRATE 50 MG PO TABS: 75.0000 mg | ORAL_TABLET | Freq: Two times a day (BID) | ORAL | Status: DC

## 2013-09-03 NOTE — Telephone Encounter (Signed)
metoprolol (LOPRESSOR) 50 MG tablet 02/04/2013 Sig: 1 1/2 tablets every am and 1 1/2 tablets every pm  Needs Rx sent to [facility] - said that they sent request Monday for him but it hasnt been sent back

## 2014-02-26 ENCOUNTER — Ambulatory Visit (INDEPENDENT_AMBULATORY_CARE_PROVIDER_SITE_OTHER): Payer: Medicare Other | Admitting: Cardiology

## 2014-02-26 ENCOUNTER — Encounter: Payer: Self-pay | Admitting: Cardiology

## 2014-02-26 VITALS — BP 128/86 | HR 77 | Ht 66.0 in | Wt 173.0 lb

## 2014-02-26 DIAGNOSIS — I739 Peripheral vascular disease, unspecified: Secondary | ICD-10-CM

## 2014-02-26 DIAGNOSIS — I779 Disorder of arteries and arterioles, unspecified: Secondary | ICD-10-CM

## 2014-02-26 DIAGNOSIS — I2510 Atherosclerotic heart disease of native coronary artery without angina pectoris: Secondary | ICD-10-CM

## 2014-02-26 DIAGNOSIS — I4891 Unspecified atrial fibrillation: Secondary | ICD-10-CM

## 2014-02-26 DIAGNOSIS — I482 Chronic atrial fibrillation, unspecified: Secondary | ICD-10-CM

## 2014-02-26 DIAGNOSIS — E785 Hyperlipidemia, unspecified: Secondary | ICD-10-CM

## 2014-02-26 MED ORDER — SIMVASTATIN 40 MG PO TABS: 40.0000 mg | ORAL_TABLET | Freq: Every day | ORAL | Status: DC

## 2014-02-26 NOTE — Patient Instructions (Signed)
   Stop Crestor  Change to Simvastatin 40mg  daily - new sent to local pharm  If tolerate new medication above, call office if want sent to mail order for 90 day supply. Continue all other medications.   Your physician has requested that you have a carotid duplex. This test is an ultrasound of the carotid arteries in your neck. It looks at blood flow through these arteries that supply the brain with blood. Allow one hour for this exam. There are no restrictions or special instructions. Office will contact with results via phone or letter.   Your physician wants you to follow up in:  1 year.  You will receive a reminder letter in the mail one-two months in advance.  If you don't receive a letter, please call our office to schedule the follow up appointment

## 2014-02-26 NOTE — Assessment & Plan Note (Signed)
Atrial fibrillation controlled. He is anticoagulated with Coumadin. Fortunately he only has to have his labs checked once every 6 weeks. He is aware of the new agents and wants to stay on Coumadin.

## 2014-02-26 NOTE — Assessment & Plan Note (Signed)
Historically the patient had minimal coronary disease. No further workup.

## 2014-02-26 NOTE — Progress Notes (Signed)
Patient ID: [name], male   DOB: 1941/01/16, 73 y.o.   MRN: [id number]    HPI  Patient is seen to follow atrial fibrillation. Historically he does have minimal coronary disease. He is carefully anticoagulated with Coumadin. This is followed by is primary physician. He's not having any significant chest pain or difficulties.  Allergies  Allergen Reactions  . Asa [Aspirin]     GI upset  . Niacin     REACTION: hot flashes    Current Outpatient Prescriptions  Medication Sig Dispense Refill  . albuterol (PROAIR HFA) 108 (90 BASE) MCG/ACT inhaler Inhale 2 puffs into the lungs every 4 (four) hours as needed for wheezing. 2 puffs every 4 hours as needed only  if your can't catch your breath  1 Inhaler  1  . budesonide-formoterol (SYMBICORT) 160-4.5 MCG/ACT inhaler Inhale 2 puffs into the lungs every morning.      . Calcium Carbonate (CALTRATE 600) 1500 MG TABS Take 2 tablets by mouth Nightly.       . Cetirizine HCl (ZYRTEC ALLERGY) 10 MG CAPS Take 1 capsule by mouth daily.       . Cholecalciferol (VITAMIN D3) 1000 UNITS CAPS Take 2,000 Units by mouth daily.       . metoprolol (LOPRESSOR) 50 MG tablet Take 1.5 tablets (75 mg total) by mouth 2 (two) times daily.  270 tablet  3  . rosuvastatin (CRESTOR) 20 MG tablet Take 1 tablet (20 mg total) by mouth daily.  90 tablet  3  . traZODone (DESYREL) 50 MG tablet Take 2 tablets by mouth at bedtime as needed.       . warfarin (COUMADIN) 4 MG tablet Take 1 tablet by mouth Daily. Per Dr. Scotty Court office       No current facility-administered medications for this visit.    History   Social History  . Marital Status: Married    Spouse Name: N/A    Number of Children: N/A  . Years of Education: N/A   Occupational History  . Retired [employer]     Social History Main Topics  . Smoking status: Former Smoker -- 3.00 packs/day for 50 years    Types: Cigarettes    Quit date: 03/18/1990  . Smokeless tobacco: Former Systems developer    Types: Chew   Quit date: 06/18/2000  . Alcohol Use: No  . Drug Use: No  . Sexual Activity: Not on file   Other Topics Concern  . Not on file   Social History Narrative   Quit smoking in 1993. No significant alcohol abuse. Retired from trucking.     No family history on file.  Past Medical History  Diagnosis Date  . Hyperlipidemia   . Gastroesophageal reflux disease   . Benign prostatic hypertrophy   . Anxiety disorder   . S/P appendectomy   . Bilateral cataracts   . Atrial fibrillation     Cardioverted in the past /  atrial fib return... decided rate control..CHADS 2  score is  0.... no Coumadin  . Ptosis of eyelid, left     Surgery in the past  . Ejection fraction     EF 60%  . CAD (coronary artery disease)     Catheterization 2009, mild CAD  . Sweating abnormality     Episodes of sweating appeared to be related to a delayed reaction to niacin  . CVA (cerebral infarction)     Hospitalization.  MRI...    Left anterior cerebral artery territory, September, 2011.Marland KitchenMarland Kitchen  Coumadin started  . Ejection fraction     EF 55-60%, echo, September, 2011  . Carotid artery disease     Doppler, September, 2011, less than 50% bilateral    History reviewed. No pertinent past surgical history.  Patient Active Problem List   Diagnosis Date Noted  . Chronic anticoagulation 02/27/2013  . 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 Pulmonary infiltrates 12/19/2012  . Ejection fraction   . Carotid artery disease   . CVA (cerebral infarction)   . CAD (coronary artery disease)   . DYSLIPIDEMIA 11/01/2009  . ATRIAL FIBRILLATION, CHRONIC 11/01/2009  . GERD 11/01/2009    ROS   Patient denies fever, chills, headache, sweats, rash, change in vision, change in hearing, chest pain, cough, nausea vomiting, urinary symptoms. All other systems are reviewed and are negative.  PHYSICAL EXAM  Patient is oriented to person time and place. Affect is normal. There is no jugulovenous distention. Head is  atraumatic. Sclera and conjunctiva are normal. Lungs are clear. Respiratory effort is nonlabored. Cardiac exam her heels S1 and S2. The rate is controlled. The rhythm is irregularly irregular. The abdomen is soft. There is no peripheral edema.  Filed Vitals:   02/26/14 0948  BP: 128/86  Pulse: 77  Height: 5\' 6"  (1.676 m)  Weight: 173 lb (78.472 kg)  SpO2: 97%   EKG is done today and reviewed by me. There is atrial flutter with a controlled rate. There is no change in the QRS.  ASSESSMENT & PLAN

## 2014-02-26 NOTE — Assessment & Plan Note (Signed)
The patient does have carotid disease is moderate. His last Doppler in September, 2014 revealed stable carotids. We will arrange for followup.

## 2014-02-26 NOTE — Assessment & Plan Note (Signed)
The patient has been on Crestor. He is no longer able to afford this. Unfortunately he had muscle aches and pains from Lipitor in the past. I explained to him that simvastatin would be a less potent than his Crestor. He still wants to try it. We will give a prescription for 40 mg of simvastatin. I've asked him to be in touch with is primary physician to arrange for followup lipids.

## 2014-03-04 ENCOUNTER — Telehealth: Payer: Self-pay | Admitting: Internal Medicine

## 2014-03-04 ENCOUNTER — Ambulatory Visit (INDEPENDENT_AMBULATORY_CARE_PROVIDER_SITE_OTHER): Payer: Medicare Other | Admitting: Cardiology

## 2014-03-04 DIAGNOSIS — I6529 Occlusion and stenosis of unspecified carotid artery: Secondary | ICD-10-CM

## 2014-03-04 DIAGNOSIS — I779 Disorder of arteries and arterioles, unspecified: Secondary | ICD-10-CM

## 2014-03-04 DIAGNOSIS — I739 Peripheral vascular disease, unspecified: Principal | ICD-10-CM

## 2014-03-04 MED ORDER — BUDESONIDE-FORMOTEROL FUMARATE 160-4.5 MCG/ACT IN AERO: 2.0000 | INHALATION_SPRAY | Freq: Every morning | RESPIRATORY_TRACT | Status: DC

## 2014-03-04 NOTE — Progress Notes (Signed)
Carotid Duplex performed. 

## 2014-03-04 NOTE — Telephone Encounter (Signed)
Refill has been sent to the pharmacy and nothing further is needed.  

## 2014-03-10 ENCOUNTER — Encounter: Payer: Self-pay | Admitting: Cardiology

## 2014-03-10 ENCOUNTER — Telehealth: Payer: Self-pay | Admitting: *Deleted

## 2014-03-10 NOTE — Telephone Encounter (Signed)
Returned your call.

## 2014-03-10 NOTE — Telephone Encounter (Signed)
Message copied by Orion Modest on Wed Mar 10, 2014  3:22 PM ------      Message from: Ron Parker, [place] D      Created: Wed Mar 10, 2014  2:36 PM       Please let the patient know that the carotid Dopplers stable. ------

## 2014-03-11 NOTE — Telephone Encounter (Signed)
Pt informed of results.

## 2014-03-16 ENCOUNTER — Ambulatory Visit (INDEPENDENT_AMBULATORY_CARE_PROVIDER_SITE_OTHER): Payer: Medicare Other | Admitting: Internal Medicine

## 2014-03-16 ENCOUNTER — Encounter: Payer: Self-pay | Admitting: Internal Medicine

## 2014-03-16 ENCOUNTER — Ambulatory Visit (INDEPENDENT_AMBULATORY_CARE_PROVIDER_SITE_OTHER)
Admission: RE | Admit: 2014-03-16 | Discharge: 2014-03-16 | Disposition: A | Discharge status: Home / Self Care | Payer: Medicare Other | Source: Ambulatory Visit | Attending: Internal Medicine | Admitting: Internal Medicine

## 2014-03-16 VITALS — BP 112/60 | HR 94 | Temp 98.4°F | Ht 66.0 in | Wt 174.4 lb

## 2014-03-16 DIAGNOSIS — I6529 Occlusion and stenosis of unspecified carotid artery: Secondary | ICD-10-CM

## 2014-03-16 DIAGNOSIS — J449 Chronic obstructive pulmonary disease, unspecified: Secondary | ICD-10-CM

## 2014-03-16 DIAGNOSIS — R918 Other nonspecific abnormal finding of lung field: Secondary | ICD-10-CM

## 2014-03-16 NOTE — Progress Notes (Signed)
Quick Note:  D/w pt at ov ______ 

## 2014-03-16 NOTE — Patient Instructions (Addendum)
Symbicort 160 Take 2 puffs first thing in am and then another 2 puffs about 12 hours later.   Only use your albuterol as a rescue medication to be used if you can't catch your breath by resting or doing a relaxed purse lip breathing pattern.  - The less you use it, the better it will work when you need it. - Ok to use up to 2 puffs  every 4 hours if you must but call for immediate appointment if use goes up over your usual need - Don't leave home without it !!  (think of it like the spare tire for your car)   Work on maintaining perfect inhaler technique:  relax and gently blow all the way out then take a nice smooth deep breath back in, triggering the inhaler at same time you start breathing in.  Hold for up to 5 seconds if you can.  Rinse and gargle with water when done  If you are satisfied with your treatment plan,  let your doctor know and he/she can either refill your medications or you can return here when your prescription runs out.     If in any way you are not 100% satisfied,  please tell us.  If 100% better, tell your friends!  Pulmonary follow up is as needed

## 2014-03-16 NOTE — Assessment & Plan Note (Signed)
-   See CT [facility] 12/09/12 Lingula and LUL x 12 mm gg changes (tickle file for 03/11/13)  Plain cxr 01/21/13  complete resolution of infiltrates so f/u ct is optional and probably not necessary.   No changes by plain film today, no f/u needed.

## 2014-03-16 NOTE — Assessment & Plan Note (Addendum)
-   PFT's FEV1 1.10 (44%) ratio 54 and 41% reversibilty  DLCO 69 corrects to 99%   DDX of  difficult airways management all start with A and  include Adherence, Ace Inhibitors, Acid Reflux, Active Sinus Disease, Alpha 1 Antitripsin deficiency, Anxiety masquerading as Airways dz,  ABPA,  allergy(esp in young), Aspiration (esp in elderly), Adverse effects of DPI,  Active smokers, plus two Bs  = Bronchiectasis and Beta blocker use..and one C= CHF  Adherence is always the initial "prime suspect" and is a multilayered concern that requires a "trust but verify" approach in every patient - starting with knowing how to use medications, especially inhalers, correctly, keeping up with refills and understanding the fundamental difference between maintenance and prns vs those medications only taken for a very short course and then stopped and not refilled.  The proper method of use, as well as anticipated side effects, of a metered-dose inhaler are discussed and demonstrated to the patient. Improved effectiveness after extensive coaching during this visit to a level of approximately  90% though baseline < 50% so needs to go back to symbicort 160 2bid until 100% of baseline then reduce pm dose if wants to save $   ? BB effect:  Ideally   prefer in this setting: Bystolic, the most beta -1  selective Beta blocker available in sample form, with bisoprolol the most selective generic choice  on the market - consider this option if symptoms persist.  See instructions for specific recommendations which were reviewed directly with the patient who was given a copy with highlighter outlining the key components.

## 2014-03-16 NOTE — Progress Notes (Signed)
Subjective:    Patient ID: [name], male    DOB: [birth year]   MRN: [id number]     Brief patient profile:  53 yowm quit smoking 1991 with cough resolved then recurred in 2011 treated as pna and symptoms since then of recurrent pattern spring and fall cough assoc with nasal symptoms completely resolved  After zpak and prednisone then this episode Dec 2013 never completely resolved and had CT chest 12/09/12 with as dz so referred 12/18/2012 to pulmonary by Dr Scotty Court p starting levaquin 12/15/12. 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 Followup with PFT and CXR    Pt states breathing has improved some since the last visit. No new co's today.  Still has variable doe and sense of chest congestion in ams but no purulent sputum. rec Symbicort 160 mg Take 2 puffs first thing in am and then another 2 puffs about 12 hours later.  Work on inhaler technique  03/09/2013 f/u ov/[name] re: copd Chief Complaint  Patient presents with  . Follow-up    Pt states his breathing continues to improve, and he denies any new co's today.    no limiting sob, rare need for saba  hafa rec Symbicort 160 mg Take 2 puffs first thing in am and then another 2 puffs about 12 hours later (optional) Work on Engineer, technical sales technique:    Only use your albuterol(proaire) as a rescue   03/16/2014 1st [place] [facility] office visit/ [name]  / symbicort 160  2 pffs only (did not increase as rec) Chief Complaint  Patient presents with  . Followup with CXR    Pt  states that he had URI end of Aug 2015- was txed with pred and symptoms improved but have since returned. He c/o lack of energy, cough and increased SOB. Cough is prod with minimal yellow sputum in the am's.  He has only used rescue inhaler x 1 since last visit here.   sleeping  well s cough now and no longer using codeine Seemed to do the best p rx with prednisone   No obvious day to day or daytime variabilty or assoc purulent or excess mucus or cp or chest tightness, subjective wheeze overt sinus or hb symptoms. No unusual exp hx or h/o childhood pna/ asthma or knowledge of premature birth.  Sleeping ok without nocturnal  or early am exacerbation  of respiratory  c/o's or need for noct saba. Also denies any obvious fluctuation of symptoms with weather or environmental changes or other aggravating or alleviating factors except as outlined above   Current Medications, Allergies, Complete Past Medical History, Past Surgical History, Family History, and Social History were reviewed in [employer] record.  ROS  The following are not active complaints unless bolded sore throat, dysphagia, dental problems, itching, sneezing,  nasal congestion or excess/ purulent secretions, ear ache,   fever, chills, sweats, unintended wt loss, pleuritic or exertional cp, hemoptysis,  orthopnea pnd or leg  swelling, presyncope, palpitations, heartburn, abdominal pain, anorexia, nausea, vomiting, diarrhea  or change in bowel or urinary habits, change in stools or urine, dysuria,hematuria,  rash, arthralgias, visual complaints, headache, numbness weakness or ataxia or problems with walking or coordination,  change in mood/affect or memory.             Objective:   Physical Exam  Pleasant amb wm nad  03/16/2014        174  Wt Readings from Last 3 Encounters:  03/09/13 179 lb (81.194 kg)  02/27/13 177 lb (80.287 kg)  01/21/13 176 lb (79.833 kg)       HEENT mild turbinate edema.  Edentulous  Oropharynx no thrush or excess pnd or cobblestoning.  No JVD or cervical adenopathy. Mild accessory muscle hypertrophy. Trachea midline, nl thryroid. Chest was hyperinflated by percussion with diminished breath sounds and moderate increased exp time without wheeze. Hoover sign positive at mid inspiration. Regular rate and rhythm without murmur gallop or rub or increase P2 or edema.  Abd: no hsm, nl excursion. Ext warm without cyanosis or clubbing.      CT 11/2412 with as dz Lingula and LUL  cxr 01/21/13 No acute cardiopulmonary disease.  Interval resolution of the previously identified subtle lingular opacity.  CXR  03/16/2014 :  No active cardiopulmonary disease.      Assessment & Plan:

## 2014-03-25 ENCOUNTER — Telehealth: Payer: Self-pay | Admitting: Cardiology

## 2014-03-25 ENCOUNTER — Ambulatory Visit: Payer: Medicare Other | Admitting: Internal Medicine

## 2014-03-25 NOTE — Telephone Encounter (Signed)
Mr. Mahler needs refill on SIMVASTATIN 40 MG PO TABS Requesting a 90 day supply -needs to go to [facility]

## 2014-03-26 MED ORDER — SIMVASTATIN 40 MG PO TABS: 40.0000 mg | ORAL_TABLET | Freq: Every day | ORAL | Status: DC

## 2014-04-30 ENCOUNTER — Other Ambulatory Visit: Payer: Self-pay | Admitting: Internal Medicine

## 2015-03-02 ENCOUNTER — Encounter: Payer: Self-pay | Admitting: *Deleted

## 2015-03-07 ENCOUNTER — Ambulatory Visit (INDEPENDENT_AMBULATORY_CARE_PROVIDER_SITE_OTHER): Payer: Medicare Other | Admitting: Cardiology

## 2015-03-07 ENCOUNTER — Encounter: Payer: Self-pay | Admitting: Cardiology

## 2015-03-07 VITALS — BP 122/64 | HR 65 | Ht 66.0 in | Wt 176.0 lb

## 2015-03-07 DIAGNOSIS — I482 Chronic atrial fibrillation, unspecified: Secondary | ICD-10-CM

## 2015-03-07 DIAGNOSIS — I2510 Atherosclerotic heart disease of native coronary artery without angina pectoris: Secondary | ICD-10-CM | POA: Diagnosis not present

## 2015-03-07 DIAGNOSIS — Z7901 Long term (current) use of anticoagulants: Secondary | ICD-10-CM

## 2015-03-07 DIAGNOSIS — I739 Peripheral vascular disease, unspecified: Secondary | ICD-10-CM

## 2015-03-07 DIAGNOSIS — I779 Disorder of arteries and arterioles, unspecified: Secondary | ICD-10-CM

## 2015-03-07 NOTE — Patient Instructions (Signed)
Your physician has requested that you have a carotid duplex. This test is an ultrasound of the carotid arteries in your neck. It looks at blood flow through these arteries that supply the brain with blood. Allow one hour for this exam. There are no restrictions or special instructions. Office will contact with results via phone or letter.   Continue all current medications. Your physician wants you to follow up in:  1 year.  You will receive a reminder letter in the mail one-two months in advance.  If you don't receive a letter, please call our office to schedule the follow up appointment   

## 2015-03-07 NOTE — Assessment & Plan Note (Signed)
Patient remains on Coumadin for his atrial fibrillation.

## 2015-03-07 NOTE — Assessment & Plan Note (Signed)
His last Doppler was September, 2015. He will need a follow-up this year.

## 2015-03-07 NOTE — Assessment & Plan Note (Signed)
Patient has chronic atrial fibrillation. The rate is controlled. He is anticoagulated. No further workup.

## 2015-03-07 NOTE — Progress Notes (Signed)
Cardiology Office Note   Date:  03/07/2015   ID:  [name], Sean July 25, 1940, MRN [id number]  PCP:  Deloria Lair, MD  Cardiologist:  Dola Argyle, MD   Chief Complaint  Patient presents with  . Appointment    Follow-up atrial fibrillation      History of Present Illness: [name] is a 74 y.o. male who presents today to follow up atrial fibrillation. His chronic atrial fib. His rate is controlled. He is anticoagulated. He has carotid disease. His Doppler in September, 2015 showed stable disease. He will need a follow-up study this year.    Past Medical History  Diagnosis Date  . Hyperlipidemia   . Gastroesophageal reflux disease   . Benign prostatic hypertrophy   . Anxiety disorder   . S/P appendectomy   . Bilateral cataracts   . Atrial fibrillation     Cardioverted in the past /  atrial fib return... decided rate control..CHADS 2  score is  0.... no Coumadin  . Ptosis of eyelid, left     Surgery in the past  . Ejection fraction     EF 60%  . CAD (coronary artery disease)     Catheterization 2009, mild CAD  . Sweating abnormality     Episodes of sweating appeared to be related to a delayed reaction to niacin  . CVA (cerebral infarction)     Hospitalization.  MRI...    Left anterior cerebral artery territory, September, 2011... Coumadin started  . Ejection fraction     EF 55-60%, echo, September, 2011  . Carotid artery disease     Doppler, September, 2011, less than 50% bilateral    History reviewed. No pertinent past surgical history.  Patient Active Problem List   Diagnosis Date Noted  . Chronic anticoagulation 02/27/2013  . 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 Pulmonary infiltrates 12/19/2012  . Ejection fraction   . Carotid artery disease   . CVA (cerebral infarction)   . CAD (coronary artery disease)   . DYSLIPIDEMIA 11/01/2009  . ATRIAL FIBRILLATION, CHRONIC 11/01/2009  . GERD 11/01/2009      Current Outpatient  Prescriptions  Medication Sig Dispense Refill  . albuterol (PROAIR HFA) 108 (90 BASE) MCG/ACT inhaler Inhale 2 puffs into the lungs every 4 (four) hours as needed for wheezing. 2 puffs every 4 hours as needed only  if your can't catch your breath 1 Inhaler 1  . Calcium Carbonate (CALTRATE 600) 1500 MG TABS Take 2 tablets by mouth Nightly.     . Cetirizine HCl (ZYRTEC ALLERGY) 10 MG CAPS Take 1 capsule by mouth daily.     . Cholecalciferol (VITAMIN D3) 1000 UNITS CAPS Take 2,000 Units by mouth daily.     . metoprolol (LOPRESSOR) 50 MG tablet Take 1.5 tablets (75 mg total) by mouth 2 (two) times daily. 270 tablet 3  . simvastatin (ZOCOR) 40 MG tablet Take 1 tablet (40 mg total) by mouth daily. 90 tablet 3  . SYMBICORT 160-4.5 MCG/ACT inhaler INHALE 2 PUFFS INTO THE LUNGS EVERY MORNING -APPOINTMENT WITH DR Melvyn Novas NEEDED FOR FURTHER REFILLS 2 Inhaler 0  . traZODone (DESYREL) 50 MG tablet Take 2 tablets by mouth at bedtime as needed.     . warfarin (COUMADIN) 4 MG tablet Take 1 tablet by mouth Daily. Per Dr. Scotty Court office     No current facility-administered medications for this visit.    Allergies:   Asa and Niacin    Social History:  The patient  reports that he quit smoking about 24 years ago. His smoking use included Cigarettes. He has a 150 pack-year smoking history. He quit smokeless tobacco use about 14 years ago. His smokeless tobacco use included Chew. He reports that he does not drink alcohol or use illicit drugs.   Family History:  The patient's family history includes Other in an other family member.    ROS:  Please see the history of present illness.     Patient denies fever, chills, headache, sweats, rash, change in vision, change in hearing, chest pain, cough, nausea or vomiting, urinary symptoms. All other systems are reviewed and are negative. PHYSICAL EXAM: VS:  BP 122/64 mmHg  Pulse 65  Ht 5\' 6"  (1.676 m)  Wt 176 lb (79.833 kg)  BMI 28.42 kg/m2  SpO2 98% , Patient is  oriented to person time and place. Affect is normal. Head is atraumatic. Sclera and conjunctiva are normal. There is no jugular venous distention. Lungs reveal decreased breath sounds. There are a few scattered rhonchi. There is no respiratory distress. Cardiac exam reveals S1 and S2. The abdomen is soft. There is no peripheral edema. There are no musculoskeletal deformities. There are no skin rashes.   EKG:   EKG is done today and reviewed by me. There are no significant changes. There is atrial fibrillation with a controlled rate.   Recent Labs: No results found for requested labs within last 365 days.    Lipid Panel No results found for: CHOL, TRIG, HDL, CHOLHDL, VLDL, LDLCALC, LDLDIRECT    Wt Readings from Last 3 Encounters:  03/07/15 176 lb (79.833 kg)  03/16/14 174 lb 6.4 oz (79.107 kg)  02/26/14 173 lb (78.472 kg)      Current medicines are reviewed  The patient understands his medications.     ASSESSMENT AND PLAN:

## 2015-03-07 NOTE — Assessment & Plan Note (Signed)
Historically he had minimal coronary disease in the past. He is on a statin. He is not having symptoms. No further workup.

## 2015-03-10 ENCOUNTER — Ambulatory Visit (INDEPENDENT_AMBULATORY_CARE_PROVIDER_SITE_OTHER): Payer: Medicare Other

## 2015-03-10 DIAGNOSIS — I6523 Occlusion and stenosis of bilateral carotid arteries: Secondary | ICD-10-CM | POA: Diagnosis not present

## 2015-03-10 DIAGNOSIS — I739 Peripheral vascular disease, unspecified: Principal | ICD-10-CM

## 2015-03-10 DIAGNOSIS — I779 Disorder of arteries and arterioles, unspecified: Secondary | ICD-10-CM | POA: Diagnosis not present

## 2015-03-11 ENCOUNTER — Telehealth: Payer: Self-pay | Admitting: *Deleted

## 2015-03-11 NOTE — Telephone Encounter (Signed)
**Note De-identified [name] Obfuscation** Patient informed. 

## 2015-03-11 NOTE — Telephone Encounter (Signed)
-----   Message from Carlena Bjornstad, MD sent at 03/11/2015  7:27 AM EDT ----- Please notify the patient that his carotid Doppler looked good. He will not need a follow-up study for at least 2 years

## 2016-03-09 ENCOUNTER — Encounter: Payer: Self-pay | Admitting: *Deleted

## 2016-03-12 ENCOUNTER — Encounter: Payer: Self-pay | Admitting: Cardiology

## 2016-03-12 ENCOUNTER — Ambulatory Visit (INDEPENDENT_AMBULATORY_CARE_PROVIDER_SITE_OTHER): Payer: Medicare Other | Admitting: Cardiology

## 2016-03-12 VITALS — BP 134/81 | HR 66 | Ht 67.0 in | Wt 174.0 lb

## 2016-03-12 DIAGNOSIS — I482 Chronic atrial fibrillation, unspecified: Secondary | ICD-10-CM

## 2016-03-12 DIAGNOSIS — I779 Disorder of arteries and arterioles, unspecified: Secondary | ICD-10-CM

## 2016-03-12 DIAGNOSIS — I2510 Atherosclerotic heart disease of native coronary artery without angina pectoris: Secondary | ICD-10-CM

## 2016-03-12 DIAGNOSIS — E785 Hyperlipidemia, unspecified: Secondary | ICD-10-CM | POA: Diagnosis not present

## 2016-03-12 DIAGNOSIS — I739 Peripheral vascular disease, unspecified: Secondary | ICD-10-CM

## 2016-03-12 NOTE — Patient Instructions (Signed)

## 2016-03-12 NOTE — Progress Notes (Signed)
Clinical Summary [name] is a 75 y.o.male former patient of Dr [name], this is our first visit together. He is seen for the following medical problems.  1. Chronic afib - no recent palpitations - compliant with meds. No bleeding troubles on coumadin. INR followed by Dr [name].  - has not been interested in NOACs.    2. Carotid stenosis - 02/2015 carotid US: bilateral 1-39% ICA disease. Recs for f/u 2 years - no recent neuro symptoms  3. Hyperlipidemia - compliant with statin - he reports most recent labs with pcp  4. CAD - mild disease by cath 2009 - no recent chest pain.    SH: retired Forensic psychologist. Upcoming trip to [name], often goes to [name].  Past Medical History:  Diagnosis Date  . Anxiety disorder   . Atrial fibrillation ([name])    Cardioverted in the past /  atrial fib return... decided rate control..CHADS 2  score is  0.... no Coumadin  . Benign prostatic hypertrophy   . Bilateral cataracts   . CAD (coronary artery disease)    Catheterization 2009, mild CAD  . Carotid artery disease ([place])    Doppler, September, 2011, less than 50% bilateral  . CVA (cerebral infarction)    Hospitalization.  MRI...    Left anterior cerebral artery territory, September, 2011... Coumadin started  . Ejection fraction    EF 60%  . Ejection fraction    EF 55-60%, echo, September, 2011  . Gastroesophageal reflux disease   . Hyperlipidemia   . Ptosis of eyelid, left    Surgery in the past  . S/P appendectomy   . Sweating abnormality    Episodes of sweating appeared to be related to a delayed reaction to niacin     Allergies  Allergen Reactions  . Asa [Aspirin]     GI upset  . Niacin     REACTION: hot flashes     Current Outpatient Prescriptions  Medication Sig Dispense Refill  . albuterol (PROAIR HFA) 108 (90 BASE) MCG/ACT inhaler Inhale 2 puffs into the lungs every 4 (four) hours as needed for wheezing. 2 puffs every 4 hours as needed only  if  your can't catch your breath 1 Inhaler 1  . Calcium Carbonate (CALTRATE 600) 1500 MG TABS Take 2 tablets by mouth Nightly.     . Cetirizine HCl (ZYRTEC ALLERGY) 10 MG CAPS Take 1 capsule by mouth daily.     . Cholecalciferol (VITAMIN D3) 1000 UNITS CAPS Take 2,000 Units by mouth daily.     . metoprolol (LOPRESSOR) 50 MG tablet Take 1.5 tablets (75 mg total) by mouth 2 (two) times daily. 270 tablet 3  . simvastatin (ZOCOR) 40 MG tablet Take 1 tablet (40 mg total) by mouth daily. 90 tablet 3  . SYMBICORT 160-4.5 MCG/ACT inhaler INHALE 2 PUFFS INTO THE LUNGS EVERY MORNING -APPOINTMENT WITH DR [name] NEEDED FOR FURTHER REFILLS 2 Inhaler 0  . traZODone (DESYREL) 50 MG tablet Take 2 tablets by mouth at bedtime as needed.     . warfarin (COUMADIN) 4 MG tablet Take 1 tablet by mouth Daily. Per Dr. Scotty [name] office     No current facility-administered medications for this visit.      No past surgical history on file.   Allergies  Allergen Reactions  . Asa [Aspirin]     GI upset  . Niacin     REACTION: hot flashes      Family History  Problem Relation Age of  Onset  . Other      Insignificant for premature CAD     Social History [name] reports that he quit smoking about 26 years ago. His smoking use included Cigarettes. He has a 150.00 pack-year smoking history. He quit smokeless tobacco use about 15 years ago. His smokeless tobacco use included Chew. [name] reports that he does not drink alcohol.   Review of Systems CONSTITUTIONAL: No weight loss, fever, chills, weakness or fatigue.  HEENT: Eyes: No visual loss, blurred vision, double vision or yellow sclerae.No hearing loss, sneezing, congestion, runny nose or sore throat.  SKIN: No rash or itching.  CARDIOVASCULAR: per HPI RESPIRATORY: No shortness of breath, cough or sputum.  GASTROINTESTINAL: No anorexia, nausea, vomiting or diarrhea. No abdominal pain or blood.  GENITOURINARY: No burning on urination, no  polyuria NEUROLOGICAL: No headache, dizziness, syncope, paralysis, ataxia, numbness or tingling in the extremities. No change in bowel or bladder control.  MUSCULOSKELETAL: No muscle, back pain, joint pain or stiffness.  LYMPHATICS: No enlarged nodes. No history of splenectomy.  PSYCHIATRIC: No history of depression or anxiety.  ENDOCRINOLOGIC: No reports of sweating, cold or heat intolerance. No polyuria or polydipsia.  Marland Kitchen   Physical Examination Vitals:   03/12/16 1008  BP: 134/81  Pulse: 66   Vitals:   03/12/16 1008  Weight: 174 lb (78.9 kg)  Height: 5\' 7"  (1.702 m)    Gen: resting comfortably, no acute distress HEENT: no scleral icterus, pupils equal round and reactive, no palptable cervical adenopathy,  CV: RRR, no m/r/g, no jvd Resp: Clear to auscultation bilaterally GI: abdomen is soft, non-tender, non-distended, normal bowel sounds, no hepatosplenomegaly MSK: extremities are warm, no edema.  Skin: warm, no rash Neuro:  no focal deficits Psych: appropriate affect   Diagnostic Studies  02/2015 carotid US: bilateral 1-39% ICA disease. Recs for f/u 2 years  02/2008 cath FINDINGS:  The aortic pressure 98/71 with a mean of 83, left ventricular  pressure 99/20.   Coronary angiography.  The left mainstem is very short.  There is no  stenosis present.   The LAD is a large-caliber vessel that courses down and reaches the LV  apex.  The anatomy is a bit unusual as there is a Terryon Pineiro off the  proximal LAD that runs the course of an intermediate.  It supplies  multiple Malie Kashani vessels to the lateral wall.  There has been more of a  traditional appearing diagonal Rajiv Parlato from further down in the mid-LAD.  The LAD at the origin of the second diagonal Laird Runnion has a 30% stenosis.  Further down in the vessel, there is minor nonobstructive plaque but no  significant stenosis in the LAD or its Larance Ratledge vessels.  There are two  diagonals in the intermediate territory Miro Balderson but all  arising from the  proximal and mid LAD.   The left circumflex is very small.  It courses down the AV groove and  supplies no significant Gursimran Litaker vessels.   Right coronary artery.  The right coronary artery is dominant.  Proximally, there is an eccentric 50% stenosis.  This is a smooth  appearing lesion of the vessels.  The midportion of the right coronary  artery is dilated.  There is a transition zone into more normal-  appearing vessel where there is a 30% stenosis.  There is a large acute  marginal Vanesa Renier and a moderate size PDA and posterolateral Anahy Esh.  There are no other significant stenoses noted.  With intracoronary  nitroglycerin, the  lesion still appears no worse than 50%.   Left ventriculography shows low normal LV function with an LVEF in the  range of 50-55%.  There was no significant mitral regurgitation.   ASSESSMENT:  1. Mild-to-moderate right coronary artery stenosis as outlined above.  2. Minimal left anterior descending and left circumflex stenosis.  3. Normal left ventricular function.   Post cat PLAN:  Recommend medical therapy for nonobstructive CAD.  The patient  will resume Coumadin tonight.  He will drop his aspirin dose to 81 mg.  In reviewing Dr. Ron [name] notes, the patient may not require long-term  Coumadin.  Recommend aggressive secondary risk reduction for the  patient's diffuse nonobstructive CAD.    Assessment and Plan  1. Chronic afib - EKG shows rate controlled afib - no current symptoms - CHADS2Vasc score is 5, continue coumadin. He is not interested in NOACs  2. CAD - mild disease on prior cath - no recent symptoms -continue medical therapy  3. HYperlipidemia - request labs from pcp - continue statin.   4. Carotid stenosis - mild bilateral disease, repeat US next year    Arnoldo Lenis, M.D.

## 2016-03-21 ENCOUNTER — Encounter: Payer: Self-pay | Admitting: *Deleted

## 2016-08-28 ENCOUNTER — Encounter: Payer: Self-pay | Admitting: *Deleted

## 2016-08-30 ENCOUNTER — Ambulatory Visit (INDEPENDENT_AMBULATORY_CARE_PROVIDER_SITE_OTHER): Payer: Medicare Other | Admitting: Cardiology

## 2016-08-30 ENCOUNTER — Encounter: Payer: Self-pay | Admitting: Cardiology

## 2016-08-30 VITALS — BP 127/70 | HR 73 | Ht 67.0 in | Wt 174.0 lb

## 2016-08-30 DIAGNOSIS — R42 Dizziness and giddiness: Secondary | ICD-10-CM

## 2016-08-30 NOTE — Progress Notes (Signed)
Clinical Summary Mr. Bellamy is a 76 y.o.male seen today for follow up of the following medical problems. This is a focused visit on recent symptoms of dizziness. For more detailed medical history please refer to previous clinic notes.   1. Dizziness - started in 02/2016 - mainly occurs with standing or walking.  - no other associated symptoms. No palpitations, no chest pain, no syncope.  - drinks water 2-3 glasses per days. Coffee x 2 cups. Occasional mountain dew. - reports a somewhat different episode while driving. Had double vision, dizziness while sitting.      SH: retired Forensic psychologist.  Past Medical History:  Diagnosis Date  . Anxiety disorder   . Atrial fibrillation ([place])    Cardioverted in the past /  atrial fib return... decided rate control..CHADS 2  score is  0.... no Coumadin  . Benign prostatic hypertrophy   . Bilateral cataracts   . CAD (coronary artery disease)    Catheterization 2009, mild CAD  . Carotid artery disease ([place])    Doppler, September, 2011, less than 50% bilateral  . CVA (cerebral infarction)    Hospitalization.  MRI...    Left anterior cerebral artery territory, September, 2011... Coumadin started  . Ejection fraction    EF 60%  . Ejection fraction    EF 55-60%, echo, September, 2011  . Gastroesophageal reflux disease   . Hyperlipidemia   . Ptosis of eyelid, left    Surgery in the past  . S/P appendectomy   . Sweating abnormality    Episodes of sweating appeared to be related to a delayed reaction to niacin     Allergies  Allergen Reactions  . Asa [Aspirin]     GI upset  . Niacin     REACTION: hot flashes     Current Outpatient Prescriptions  Medication Sig Dispense Refill  . albuterol (PROAIR HFA) 108 (90 BASE) MCG/ACT inhaler Inhale 2 puffs into the lungs every 4 (four) hours as needed for wheezing. 2 puffs every 4 hours as needed only  if your can't catch your breath 1 Inhaler 1  . Calcium Carbonate (CALTRATE 600)  1500 MG TABS Take 2 tablets by mouth Nightly.     . Cetirizine HCl (ZYRTEC ALLERGY) 10 MG CAPS Take 1 capsule by mouth daily.     . Cholecalciferol (VITAMIN D3) 1000 UNITS CAPS Take 2,000 Units by mouth daily.     . metoprolol (LOPRESSOR) 50 MG tablet Take 50 mg by mouth 2 (two) times daily.    . simvastatin (ZOCOR) 40 MG tablet Take 1 tablet (40 mg total) by mouth daily. 90 tablet 3  . SYMBICORT 160-4.5 MCG/ACT inhaler INHALE 2 PUFFS INTO THE LUNGS EVERY MORNING -APPOINTMENT WITH DR Melvyn Novas NEEDED FOR FURTHER REFILLS 2 Inhaler 0  . traZODone (DESYREL) 50 MG tablet Take 2 tablets by mouth at bedtime as needed.     . warfarin (COUMADIN) 4 MG tablet Take 1 tablet by mouth Daily. Per Dr. Scotty Court office     No current facility-administered medications for this visit.      No past surgical history on file.   Allergies  Allergen Reactions  . Asa [Aspirin]     GI upset  . Niacin     REACTION: hot flashes      Family History  Problem Relation Age of Onset  . Other      Insignificant for premature CAD     Social History Mr. Glassco reports that he quit smoking  about 26 years ago. His smoking use included Cigarettes. He has a 150.00 pack-year smoking history. He quit smokeless tobacco use about 16 years ago. His smokeless tobacco use included Chew. Mr. Hackbart reports that he does not drink alcohol.   Review of Systems CONSTITUTIONAL: No weight loss, fever, chills, weakness or fatigue.  HEENT: Eyes: No visual loss, blurred vision, double vision or yellow sclerae.No hearing loss, sneezing, congestion, runny nose or sore throat.  SKIN: No rash or itching.  CARDIOVASCULAR: per HPI RESPIRATORY: No shortness of breath, cough or sputum.  GASTROINTESTINAL: No anorexia, nausea, vomiting or diarrhea. No abdominal pain or blood.  GENITOURINARY: No burning on urination, no polyuria NEUROLOGICAL: +dizziness MUSCULOSKELETAL: No muscle, back pain, joint pain or stiffness.  LYMPHATICS: No enlarged  nodes. No history of splenectomy.  PSYCHIATRIC: No history of depression or anxiety.  ENDOCRINOLOGIC: No reports of sweating, cold or heat intolerance. No polyuria or polydipsia.  Marland Kitchen   Physical Examination Vitals:   08/30/16 1416  BP: 127/70  Pulse: 73   Vitals:   08/30/16 1416  Weight: 174 lb (78.9 kg)  Height: 5\' 7"  (1.702 m)    Gen: resting comfortably, no acute distress HEENT: no scleral icterus, pupils equal round and reactive, no palptable cervical adenopathy,  CV: RRR, no m/r/g, no jvd Resp: Clear to auscultation bilaterally GI: abdomen is soft, non-tender, non-distended, normal bowel sounds, no hepatosplenomegaly MSK: extremities are warm, no edema.  Skin: warm, no rash Neuro:  no focal deficits Psych: appropriate affect   Diagnostic Studies 02/2015 carotid US: bilateral 1-39% ICA disease. Recs for f/u 2 years  02/2008 cath FINDINGS: The aortic pressure 98/71 with a mean of 83, left ventricular pressure 99/20.  Coronary angiography. The left mainstem is very short. There is no stenosis present.  The LAD is a large-caliber vessel that courses down and reaches the LV apex. The anatomy is a bit unusual as there is a Yong Wahlquist off the proximal LAD that runs the course of an intermediate. It supplies multiple Evamae Rowen vessels to the lateral wall. There has been more of a traditional appearing diagonal Chaunice Obie from further down in the mid-LAD. The LAD at the origin of the second diagonal Shalyn Koral has a 30% stenosis. Further down in the vessel, there is minor nonobstructive plaque but no significant stenosis in the LAD or its Kiaraliz Rafuse vessels. There are two diagonals in the intermediate territory Syble Picco but all arising from the proximal and mid LAD.  The left circumflex is very small. It courses down the AV groove and supplies no significant Tyrae Alcoser vessels.  Right coronary artery. The right coronary artery is dominant. Proximally, there is an  eccentric 50% stenosis. This is a smooth appearing lesion of the vessels. The midportion of the right coronary artery is dilated. There is a transition zone into more normal- appearing vessel where there is a 30% stenosis. There is a large acute marginal Gal Smolinski and a moderate size PDA and posterolateral Damarrion Mimbs. There are no other significant stenoses noted. With intracoronary nitroglycerin, the lesion still appears no worse than 50%.  Left ventriculography shows low normal LV function with an LVEF in the range of 50-55%. There was no significant mitral regurgitation.  ASSESSMENT: 1. Mild-to-moderate right coronary artery stenosis as outlined above. 2. Minimal left anterior descending and left circumflex stenosis. 3. Normal left ventricular function.  Post cat PLAN: Recommend medical therapy for nonobstructive CAD. The patient will resume Coumadin tonight. He will drop his aspirin dose to 81 mg. In reviewing Dr. Ron Parker  notes, the patient may not require long-term Coumadin. Recommend aggressive secondary risk reduction for the patient's diffuse nonobstructive CAD.     Assessment and Plan  1. Dizziness - symptoms most consistent with orthostatic dizziness, though his orthostatics are negative today. Encouraged increased oral hydration.  -given his history of arrythmia will obtain 7 day event monitor - if negative cardiac workup, may need to f/u with pcp to consider recurrence of his prior vertigo.    F/u 6 months      Arnoldo Lenis, M.D.

## 2016-08-30 NOTE — Patient Instructions (Signed)
Your physician wants you to follow-up in: 6 months with Dr. Harl Bowie. You will receive a reminder letter in the mail two months in advance. If you don't receive a letter, please call our office to schedule the follow-up appointment.  Your physician has recommended that you wear an event monitor for 7 days. Event monitors are medical devices that record the heart's electrical activity. Doctors most often [place] these monitors to diagnose arrhythmias. Arrhythmias are problems with the speed or rhythm of the heartbeat. The monitor is a small, portable device. You can wear one while you do your normal daily activities. This is usually used to diagnose what is causing palpitations/syncope (passing out).  Your physician recommends that you continue on your current medications as directed. Please refer to the Current Medication list given to you today.

## 2016-09-06 ENCOUNTER — Ambulatory Visit (INDEPENDENT_AMBULATORY_CARE_PROVIDER_SITE_OTHER): Payer: Medicare Other

## 2016-09-06 DIAGNOSIS — R42 Dizziness and giddiness: Secondary | ICD-10-CM

## 2016-09-21 ENCOUNTER — Telehealth: Payer: Self-pay | Admitting: *Deleted

## 2016-09-21 NOTE — Telephone Encounter (Signed)
-----   Message from Arnoldo Lenis, MD sent at 09/19/2016  2:23 PM EDT ----- Normal heart monitor. How are his symptoms doing?   [place]

## 2016-09-21 NOTE — Telephone Encounter (Signed)
Pt aware - says only has occasional dizzy spells as discussed before - says no worse and denies any other symptoms - routed to pcp

## 2016-12-04 ENCOUNTER — Telehealth: Payer: Self-pay | Admitting: Cardiology

## 2016-12-04 NOTE — Telephone Encounter (Signed)
[name] called stating that he is in the donut hole with insurance. States that he cannot afford Eliquis.  Please call    [phone]

## 2016-12-05 MED ORDER — APIXABAN 5 MG PO TABS: 5.0000 mg | ORAL_TABLET | Freq: Two times a day (BID) | ORAL | 0 refills | Status: DC

## 2016-12-05 NOTE — Telephone Encounter (Signed)
Message left on vm that samples of eliquis are available for pick up.

## 2016-12-31 ENCOUNTER — Telehealth: Payer: Self-pay | Admitting: *Deleted

## 2016-12-31 MED ORDER — APIXABAN 5 MG PO TABS: 5.0000 mg | ORAL_TABLET | Freq: Two times a day (BID) | ORAL | 0 refills | Status: DC

## 2016-12-31 NOTE — Telephone Encounter (Signed)
Patient called requesting samples of eliquis 5 mg. Patient advised that samples are available for pick up.

## 2017-01-30 ENCOUNTER — Other Ambulatory Visit: Payer: Self-pay | Admitting: *Deleted

## 2017-01-30 MED ORDER — APIXABAN 5 MG PO TABS: 5.0000 mg | ORAL_TABLET | Freq: Two times a day (BID) | ORAL | 0 refills | Status: DC

## 2017-02-07 DIAGNOSIS — N400 Enlarged prostate without lower urinary tract symptoms: Secondary | ICD-10-CM | POA: Insufficient documentation

## 2017-02-07 DIAGNOSIS — R42 Dizziness and giddiness: Secondary | ICD-10-CM | POA: Insufficient documentation

## 2017-02-07 DIAGNOSIS — N182 Chronic kidney disease, stage 2 (mild): Secondary | ICD-10-CM | POA: Insufficient documentation

## 2017-02-26 ENCOUNTER — Telehealth: Payer: Self-pay | Admitting: Cardiology

## 2017-02-26 MED ORDER — APIXABAN 5 MG PO TABS: 5.0000 mg | ORAL_TABLET | Freq: Two times a day (BID) | ORAL | 0 refills | Status: DC

## 2017-02-26 NOTE — Telephone Encounter (Signed)
Samples provided.  Phone number given for [facility] to see if he can get any help till end of the year since he is in the doughnut whole.

## 2017-02-26 NOTE — Telephone Encounter (Signed)
[name] walked into the office requesting samples of samples of eliquis .

## 2017-03-18 ENCOUNTER — Encounter: Payer: Self-pay | Admitting: *Deleted

## 2017-03-19 ENCOUNTER — Ambulatory Visit (INDEPENDENT_AMBULATORY_CARE_PROVIDER_SITE_OTHER): Payer: Medicare Other | Admitting: Cardiology

## 2017-03-19 ENCOUNTER — Encounter: Payer: Self-pay | Admitting: Cardiology

## 2017-03-19 VITALS — BP 118/62 | HR 87 | Ht 66.0 in | Wt 172.8 lb

## 2017-03-19 DIAGNOSIS — I482 Chronic atrial fibrillation, unspecified: Secondary | ICD-10-CM

## 2017-03-19 DIAGNOSIS — I2510 Atherosclerotic heart disease of native coronary artery without angina pectoris: Secondary | ICD-10-CM | POA: Diagnosis not present

## 2017-03-19 DIAGNOSIS — E782 Mixed hyperlipidemia: Secondary | ICD-10-CM

## 2017-03-19 DIAGNOSIS — I6523 Occlusion and stenosis of bilateral carotid arteries: Secondary | ICD-10-CM

## 2017-03-19 MED ORDER — PRAVASTATIN SODIUM 20 MG PO TABS: 20.0000 mg | ORAL_TABLET | Freq: Every day | ORAL | 1 refills | Status: DC

## 2017-03-19 NOTE — Progress Notes (Signed)
Clinical Summary [name] is a 76 y.o.male seen today for follow up of the following medical problems.    1. Chronic afib - no recent palpitations  compliant with meds. No bleeding on eliquis. Recently entered donut hole with his insurance with increase in cost. He is interested in changing back to coumadin    2. Carotid stenosis - 02/2015 carotid US: bilateral 1-39% ICA disease.  - no recent neuro symptoms.   3. Hyperlipidemia - joint pains on simvaststin, atorvastatin, rosuvastatin. Not currently on therapy.     4. 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    Past Medical History:  Diagnosis Date  . Anxiety disorder   . Atrial fibrillation ([place])    Cardioverted in the past /  atrial fib return... decided rate control..CHADS 2  score is  0.... no Coumadin  . Benign prostatic hypertrophy   . Bilateral cataracts   . CAD (coronary artery disease)    Catheterization 2009, mild CAD  . Carotid artery disease ([place])    Doppler, September, 2011, less than 50% bilateral  . CVA (cerebral infarction)    Hospitalization.  MRI...    Left anterior cerebral artery territory, September, 2011... Coumadin started  . Ejection fraction    EF 60%  . Ejection fraction    EF 55-60%, echo, September, 2011  . Gastroesophageal reflux disease   . Hyperlipidemia   . Ptosis of eyelid, left    Surgery in the past  . S/P appendectomy   . Sweating abnormality    Episodes of sweating appeared to be related to a delayed reaction to niacin     Allergies  Allergen Reactions  . Asa [Aspirin]     GI upset  . Niacin     REACTION: hot flashes     Current Outpatient Prescriptions  Medication Sig Dispense Refill  . albuterol (PROAIR HFA) 108 (90 BASE) MCG/ACT inhaler Inhale 2 puffs into the lungs every 4 (four) hours as needed for wheezing. 2 puffs every 4 hours  as needed only  if your can't catch your breath 1 Inhaler 1  . apixaban (ELIQUIS) 5 MG TABS tablet Take 1 tablet (5 mg total) by mouth 2 (two) times daily. 56 tablet 0  . Calcium Carbonate (CALTRATE 600) 1500 MG TABS Take 2 tablets by mouth Nightly.     . Cetirizine HCl (ZYRTEC ALLERGY) 10 MG CAPS Take 1 capsule by mouth daily.     . Cholecalciferol (VITAMIN D3) 1000 UNITS CAPS Take 2,000 Units by mouth daily.     . metoprolol (LOPRESSOR) 50 MG tablet Take 50 mg by mouth 2 (two) times daily.    . simvastatin (ZOCOR) 40 MG tablet Take 1 tablet (40 mg total) by mouth daily. 90 tablet 3  . SYMBICORT 160-4.5 MCG/ACT inhaler INHALE 2 PUFFS INTO THE LUNGS EVERY MORNING -APPOINTMENT WITH DR Melvyn Novas NEEDED FOR FURTHER REFILLS 2 Inhaler 0  . traZODone (DESYREL) 50 MG tablet Take 2 tablets by mouth at bedtime as needed.      No current facility-administered medications for this visit.      No past surgical history on file.   Allergies  Allergen Reactions  . Asa [Aspirin]     GI upset  . Niacin     REACTION: hot flashes      Family History  Problem  Relation Age of Onset  . Other Unknown        Insignificant for premature CAD     Social History [name] reports that he quit smoking about 27 years ago. His smoking use included Cigarettes. He has a 150.00 pack-year smoking history. He quit smokeless tobacco use about 16 years ago. His smokeless tobacco use included Chew. [name] reports that he does not drink alcohol.   Review of Systems CONSTITUTIONAL:some fatigue HEENT: Eyes: No visual loss, blurred vision, double vision or yellow sclerae.No hearing loss, sneezing, congestion, runny nose or sore throat.  SKIN: No rash or itching.  CARDIOVASCULAR: per hpi RESPIRATORY: No shortness of breath, cough or sputum.  GASTROINTESTINAL: No anorexia, nausea, vomiting or diarrhea. No abdominal pain or blood.  GENITOURINARY: No burning on urination, no polyuria NEUROLOGICAL: No headache,  dizziness, syncope, paralysis, ataxia, numbness or tingling in the extremities. No change in bowel or bladder control.  MUSCULOSKELETAL: No muscle, back pain, joint pain or stiffness.  LYMPHATICS: No enlarged nodes. No history of splenectomy.  PSYCHIATRIC: No history of depression or anxiety.  ENDOCRINOLOGIC: No reports of sweating, cold or heat intolerance. No polyuria or polydipsia.  Marland Kitchen   Physical Examination Vitals:   03/19/17 1609  BP: 118/62  Pulse: 87  SpO2: 96%   Vitals:   03/19/17 1609  Weight: 172 lb 12.8 oz (78.4 kg)  Height: 5\' 6"  (1.676 m)    Gen: resting comfortably, no acute distress HEENT: no scleral icterus, pupils equal round and reactive, no palptable cervical adenopathy,  CV: RRR, no m/r/g, no jvd Resp: Clear to auscultation bilaterally GI: abdomen is soft, non-tender, non-distended, normal bowel sounds, no hepatosplenomegaly MSK: extremities are warm, no edema.  Skin: warm, no rash Neuro:  no focal deficits Psych: appropriate affect   Diagnostic Studies 02/2015 carotid US: bilateral 1-39% ICA disease. Recs for f/u 2 years  02/2008 cath FINDINGS: The aortic pressure 98/71 with a mean of 83, left ventricular pressure 99/20.  Coronary angiography. The left mainstem is very short. There is no stenosis present.  The LAD is a large-caliber vessel that courses down and reaches the LV apex. The anatomy is a bit unusual as there is a [name] off the proximal LAD that runs the course of an intermediate. It supplies multiple [name] vessels to the lateral wall. There has been more of a traditional appearing diagonal [name] from further down in the mid-LAD. The LAD at the origin of the second diagonal [name] has a 30% stenosis. Further down in the vessel, there is minor nonobstructive plaque but no significant stenosis in the LAD or its [name] vessels. There are two diagonals in the intermediate territory [name] but all arising from  the proximal and mid LAD.  The left circumflex is very small. It courses down the AV groove and supplies no significant [name] vessels.  Right coronary artery. The right coronary artery is dominant. Proximally, there is an eccentric 50% stenosis. This is a smooth appearing lesion of the vessels. The midportion of the right coronary artery is dilated. There is a transition zone into more normal- appearing vessel where there is a 30% stenosis. There is a large acute marginal Emmitt Matthews and a moderate size PDA and posterolateral Lavette Yankovich. There are no other significant stenoses noted. With intracoronary nitroglycerin, the lesion still appears no worse than 50%.  Left ventriculography shows low normal LV function with an LVEF in the range of 50-55%. There was no significant mitral regurgitation.  ASSESSMENT: 1. Mild-to-moderate right coronary  artery stenosis as outlined above. 2. Minimal left anterior descending and left circumflex stenosis. 3. Normal left ventricular function.  Post cat PLAN: Recommend medical therapy for nonobstructive CAD. The patient will resume Coumadin tonight. He will drop his aspirin dose to 81 mg. In reviewing Dr. Ron Parker notes, the patient may not require long-term Coumadin. Recommend aggressive secondary risk reduction for the patient's diffuse nonobstructive CAD.   08/2016 event monitor  Telemetry tracings show rate controlled atrial fibrillation  No symptoms reported    Assessment and Plan    1. Chronic afib - no recent symptoms.  - CHADS2Vasc score is 5. Eliquis is expensive for him, he prefers to restart coumadin. We will refer to coumadin clinic.   2. CAD - mild disease on prior cath -no symptoms, continue current meds  3. HYperlipidemia - intolerant to multiple statins, we will try pravastatin 20mg  daily. .   4. Carotid stenosis - mild bilateral disease - no symptoms - continue to monitor.    F/u  4 months. Recheck CMET/CBC/TSH for fatigue.       Arnoldo Lenis, M.D.,

## 2017-03-19 NOTE — Patient Instructions (Addendum)
Your physician recommends that you schedule a follow-up appointment in: [place] has recommended you make the following change in your medication:   START PRAVASTATIN 20 MG DAILY  Your physician recommends that you return for lab work CBC/CMP/TSH  STOP [place]  WE WILL GIVE YOU AN APPOINTMENT WITH COUMADIN NURSE   Thank you for choosing [facility]!!

## 2017-03-26 ENCOUNTER — Ambulatory Visit (INDEPENDENT_AMBULATORY_CARE_PROVIDER_SITE_OTHER): Payer: Medicare Other | Admitting: *Deleted

## 2017-03-26 DIAGNOSIS — I482 Chronic atrial fibrillation, unspecified: Secondary | ICD-10-CM

## 2017-03-26 DIAGNOSIS — Z5181 Encounter for therapeutic drug level monitoring: Secondary | ICD-10-CM | POA: Diagnosis not present

## 2017-03-26 MED ORDER — WARFARIN SODIUM 5 MG PO TABS: ORAL_TABLET | ORAL | 1 refills | Status: DC

## 2017-03-26 NOTE — Patient Instructions (Signed)

## 2017-04-05 ENCOUNTER — Ambulatory Visit: Payer: Medicare Other | Admitting: Cardiology

## 2017-04-09 ENCOUNTER — Ambulatory Visit (INDEPENDENT_AMBULATORY_CARE_PROVIDER_SITE_OTHER): Payer: Medicare Other | Admitting: *Deleted

## 2017-04-09 DIAGNOSIS — I6523 Occlusion and stenosis of bilateral carotid arteries: Secondary | ICD-10-CM

## 2017-04-09 DIAGNOSIS — I4891 Unspecified atrial fibrillation: Secondary | ICD-10-CM | POA: Diagnosis not present

## 2017-04-09 DIAGNOSIS — Z5181 Encounter for therapeutic drug level monitoring: Secondary | ICD-10-CM

## 2017-04-09 LAB — POCT INR: INR: 3

## 2017-04-18 ENCOUNTER — Ambulatory Visit (INDEPENDENT_AMBULATORY_CARE_PROVIDER_SITE_OTHER): Payer: Medicare Other | Admitting: *Deleted

## 2017-04-18 DIAGNOSIS — I482 Chronic atrial fibrillation, unspecified: Secondary | ICD-10-CM

## 2017-04-18 DIAGNOSIS — Z5181 Encounter for therapeutic drug level monitoring: Secondary | ICD-10-CM | POA: Diagnosis not present

## 2017-04-18 DIAGNOSIS — I4891 Unspecified atrial fibrillation: Secondary | ICD-10-CM

## 2017-04-18 LAB — POCT INR: INR: 3.8

## 2017-05-02 ENCOUNTER — Ambulatory Visit (INDEPENDENT_AMBULATORY_CARE_PROVIDER_SITE_OTHER): Payer: Medicare Other | Admitting: *Deleted

## 2017-05-02 DIAGNOSIS — I482 Chronic atrial fibrillation, unspecified: Secondary | ICD-10-CM

## 2017-05-02 DIAGNOSIS — Z5181 Encounter for therapeutic drug level monitoring: Secondary | ICD-10-CM | POA: Diagnosis not present

## 2017-05-02 LAB — POCT INR: INR: 2.2

## 2017-05-02 MED ORDER — WARFARIN SODIUM 5 MG PO TABS: ORAL_TABLET | ORAL | 1 refills | Status: DC

## 2017-05-23 ENCOUNTER — Ambulatory Visit (INDEPENDENT_AMBULATORY_CARE_PROVIDER_SITE_OTHER): Payer: Medicare Other | Admitting: *Deleted

## 2017-05-23 DIAGNOSIS — I482 Chronic atrial fibrillation, unspecified: Secondary | ICD-10-CM

## 2017-05-23 DIAGNOSIS — Z5181 Encounter for therapeutic drug level monitoring: Secondary | ICD-10-CM

## 2017-05-23 LAB — POCT INR: INR: 2

## 2017-06-18 HISTORY — PX: KNEE SURGERY: SHX244

## 2017-06-20 ENCOUNTER — Ambulatory Visit (INDEPENDENT_AMBULATORY_CARE_PROVIDER_SITE_OTHER): Payer: Medicare Other | Admitting: *Deleted

## 2017-06-20 DIAGNOSIS — Z5181 Encounter for therapeutic drug level monitoring: Secondary | ICD-10-CM

## 2017-06-20 DIAGNOSIS — I4891 Unspecified atrial fibrillation: Secondary | ICD-10-CM | POA: Diagnosis not present

## 2017-06-20 DIAGNOSIS — I482 Chronic atrial fibrillation, unspecified: Secondary | ICD-10-CM

## 2017-06-20 LAB — POCT INR: INR: 2.2

## 2017-06-20 NOTE — Patient Instructions (Signed)
Continue coumadin 1 tablet daily except 1/2 tablet on Tuesdays and Saturdays Recheck in 6 weeks 

## 2017-07-22 ENCOUNTER — Ambulatory Visit (INDEPENDENT_AMBULATORY_CARE_PROVIDER_SITE_OTHER): Payer: Medicare Other | Admitting: Cardiology

## 2017-07-22 ENCOUNTER — Encounter: Payer: Self-pay | Admitting: *Deleted

## 2017-07-22 ENCOUNTER — Encounter: Payer: Self-pay | Admitting: Cardiology

## 2017-07-22 VITALS — BP 122/78 | HR 70 | Ht 66.0 in | Wt 173.2 lb

## 2017-07-22 DIAGNOSIS — I2510 Atherosclerotic heart disease of native coronary artery without angina pectoris: Secondary | ICD-10-CM | POA: Diagnosis not present

## 2017-07-22 DIAGNOSIS — I6523 Occlusion and stenosis of bilateral carotid arteries: Secondary | ICD-10-CM

## 2017-07-22 DIAGNOSIS — I482 Chronic atrial fibrillation, unspecified: Secondary | ICD-10-CM

## 2017-07-22 DIAGNOSIS — E782 Mixed hyperlipidemia: Secondary | ICD-10-CM

## 2017-07-22 MED ORDER — EZETIMIBE 10 MG PO TABS: 10.0000 mg | ORAL_TABLET | Freq: Every day | ORAL | 1 refills | Status: DC

## 2017-07-22 NOTE — Patient Instructions (Signed)
Your physician wants you to follow-up in: [place] will receive a reminder letter in the mail two months in advance. If you don't receive a letter, please call our office to schedule the follow-up appointment.  Your physician has recommended you make the following change in your medication:   START ZETIA 10 MG DAILY  STOP PRAVASTATIN   Thank you for choosing [facility]!!

## 2017-07-22 NOTE — Progress Notes (Signed)
Clinical Summary [name] is a 77 y.o.male seen today for follow up of the following medical problems.    1. Chronic afib - no recent palpitations - compliant with meds No bleeding on coumadin. Followed in our coumadin clinic.   2. Carotid stenosis - 02/2015 carotid US: bilateral 1-39% ICA disease.  - denies any recent symptoms.   3. Hyperlipidemia - joint pains on simvaststin, atorvastatin, rosuvastatin, pravastatin. Not currently on therapy.   - last visit we tried starting pravastatin.Still had joint pains - pain are improved but not resolved off statin   4. CAD - mild disease by cath 2009  - no recent chest pain. No recent SOB/DOE.     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    Past Medical History:  Diagnosis Date  . Anxiety disorder   . Atrial fibrillation ([place])    Cardioverted in the past /  atrial fib return... decided rate control..CHADS 2  score is  0.... no Coumadin  . Benign prostatic hypertrophy   . Bilateral cataracts   . CAD (coronary artery disease)    Catheterization 2009, mild CAD  . Carotid artery disease ([place])    Doppler, September, 2011, less than 50% bilateral  . CVA (cerebral infarction)    Hospitalization.  MRI...    Left anterior cerebral artery territory, September, 2011... Coumadin started  . Ejection fraction    EF 60%  . Ejection fraction    EF 55-60%, echo, September, 2011  . Gastroesophageal reflux disease   . Hyperlipidemia   . Ptosis of eyelid, left    Surgery in the past  . S/P appendectomy   . Sweating abnormality    Episodes of sweating appeared to be related to a delayed reaction to niacin     Allergies  Allergen Reactions  . Asa [Aspirin]     GI upset  . Niacin     REACTION: hot flashes     Current Outpatient Medications  Medication Sig Dispense Refill  . albuterol (PROAIR HFA) 108 (90 BASE) MCG/ACT inhaler Inhale 2 puffs into the lungs  every 4 (four) hours as needed for wheezing. 2 puffs every 4 hours as needed only  if your can't catch your breath 1 Inhaler 1  . Calcium Carbonate (CALTRATE 600) 1500 MG TABS Take 2 tablets by mouth Nightly.     . Cetirizine HCl (ZYRTEC ALLERGY) 10 MG CAPS Take 1 capsule by mouth daily.     . Cholecalciferol (VITAMIN D3) 1000 UNITS CAPS Take 2,000 Units by mouth daily.     . metoprolol (LOPRESSOR) 50 MG tablet Take 50 mg by mouth 2 (two) times daily.    . pravastatin (PRAVACHOL) 20 MG tablet Take 1 tablet (20 mg total) by mouth daily. 90 tablet 1  . SYMBICORT 160-4.5 MCG/ACT inhaler INHALE 2 PUFFS INTO THE LUNGS EVERY MORNING -APPOINTMENT WITH DR Melvyn Novas NEEDED FOR FURTHER REFILLS 2 Inhaler 0  . traZODone (DESYREL) 50 MG tablet Take 2 tablets by mouth at bedtime as needed.     . warfarin (COUMADIN) 5 MG tablet Take 1 tablet daily except 1/2 tablet on Tuesdays, Thursdays and Saturdays or as directed by Coumadin Clinic 90 tablet 1   No current facility-administered medications for this visit.      Past Surgical History:  Procedure Laterality Date  . APPENDECTOMY       Allergies  Allergen Reactions  . Asa [Aspirin]     GI upset  .  Niacin     REACTION: hot flashes      Family History  Problem Relation Age of Onset  . Other Unknown        Insignificant for premature CAD     Social History [name] reports that he quit smoking about 27 years ago. His smoking use included cigarettes. He has a 150.00 pack-year smoking history. He quit smokeless tobacco use about 17 years ago. His smokeless tobacco use included chew. [name] reports that he does not drink alcohol.   Review of Systems CONSTITUTIONAL: No weight loss, fever, chills, weakness or fatigue.  HEENT: Eyes: No visual loss, blurred vision, double vision or yellow sclerae.No hearing loss, sneezing, congestion, runny nose or sore throat.  SKIN: No rash or itching.  CARDIOVASCULAR: per hpi RESPIRATORY: No shortness of  breath, cough or sputum.  GASTROINTESTINAL: No anorexia, nausea, vomiting or diarrhea. No abdominal pain or blood.  GENITOURINARY: No burning on urination, no polyuria NEUROLOGICAL: No headache, dizziness, syncope, paralysis, ataxia, numbness or tingling in the extremities. No change in bowel or bladder control.  MUSCULOSKELETAL: No muscle, back pain, joint pain or stiffness.  LYMPHATICS: No enlarged nodes. No history of splenectomy.  PSYCHIATRIC: No history of depression or anxiety.  ENDOCRINOLOGIC: No reports of sweating, cold or heat intolerance. No polyuria or polydipsia.  Marland Kitchen   Physical Examination Vitals:   07/22/17 0831  BP: 122/78  Pulse: 70  SpO2: 97%   Vitals:   07/22/17 0831  Weight: 173 lb 3.2 oz (78.6 kg)  Height: 5\' 6"  (1.676 m)    Gen: resting comfortably, no acute distress HEENT: no scleral icterus, pupils equal round and reactive, no palptable cervical adenopathy,  CV: RRR, no m/r/g, no jvd Resp: Clear to auscultation bilaterally GI: abdomen is soft, non-tender, non-distended, normal bowel sounds, no hepatosplenomegaly MSK: extremities are warm, no edema.  Skin: warm, no rash Neuro:  no focal deficits Psych: appropriate affect   Diagnostic Studies  02/2015 carotid US: bilateral 1-39% ICA disease. Recs for f/u 2 years  02/2008 cath FINDINGS: The aortic pressure 98/71 with a mean of 83, left ventricular pressure 99/20.  Coronary angiography. The left mainstem is very short. There is no stenosis present.  The LAD is a large-caliber vessel that courses down and reaches the LV apex. The anatomy is a bit unusual as there is a Bhargav Barbaro off the proximal LAD that runs the course of an intermediate. It supplies multiple Jennalee Greaves vessels to the lateral wall. There has been more of a traditional appearing diagonal Tiffay Pinette from further down in the mid-LAD. The LAD at the origin of the second diagonal Watson Robarge has a 30% stenosis. Further down in the  vessel, there is minor nonobstructive plaque but no significant stenosis in the LAD or its Tinsley Lomas vessels. There are two diagonals in the intermediate territory Kaesha Kirsch but all arising from the proximal and mid LAD.  The left circumflex is very small. It courses down the AV groove and supplies no significant Benjy Kana vessels.  Right coronary artery. The right coronary artery is dominant. Proximally, there is an eccentric 50% stenosis. This is a smooth appearing lesion of the vessels. The midportion of the right coronary artery is dilated. There is a transition zone into more normal- appearing vessel where there is a 30% stenosis. There is a large acute marginal Ysidra Sopher and a moderate size PDA and posterolateral Ho Parisi. There are no other significant stenoses noted. With intracoronary nitroglycerin, the lesion still appears no worse than 50%.  Left  ventriculography shows low normal LV function with an LVEF in the range of 50-55%. There was no significant mitral regurgitation.  ASSESSMENT: 1. Mild-to-moderate right coronary artery stenosis as outlined above. 2. Minimal left anterior descending and left circumflex stenosis. 3. Normal left ventricular function.  Post cat PLAN: Recommend medical therapy for nonobstructive CAD. The patient will resume Coumadin tonight. He will drop his aspirin dose to 81 mg. In reviewing Dr. Ron Parker notes, the patient may not require long-term Coumadin. Recommend aggressive secondary risk reduction for the patient's diffuse nonobstructive CAD.   08/2016 event monitor  Telemetry tracings show rate controlled atrial fibrillation  No symptoms reported    Assessment and Plan    1. Chronic afib - no symptoms, continue currentmeds - CHADS2Vasc score is 5. Eliquis is expensive for him, he prefers to restart coumadin. We will refer to coumadin clinic.   2. CAD - mild disease on prior cath - no recent symptoms,  continue current meds  3. HYperlipidemia - intolerant to multiple statins - we will try zetia 10mg  daily.   4. Carotid stenosis - mild bilateral disease - asymptomatic, continue to monitor.     F/u 6 months   Arnoldo Lenis, M.D.

## 2017-07-25 ENCOUNTER — Encounter: Payer: Self-pay | Admitting: Cardiology

## 2017-08-01 ENCOUNTER — Ambulatory Visit (INDEPENDENT_AMBULATORY_CARE_PROVIDER_SITE_OTHER): Payer: Medicare Other | Admitting: *Deleted

## 2017-08-01 DIAGNOSIS — I482 Chronic atrial fibrillation, unspecified: Secondary | ICD-10-CM

## 2017-08-01 DIAGNOSIS — I4891 Unspecified atrial fibrillation: Secondary | ICD-10-CM | POA: Diagnosis not present

## 2017-08-01 DIAGNOSIS — Z5181 Encounter for therapeutic drug level monitoring: Secondary | ICD-10-CM | POA: Diagnosis not present

## 2017-08-01 LAB — POCT INR: INR: 2.4

## 2017-08-01 NOTE — Patient Instructions (Signed)
Continue coumadin 1 tablet daily except 1/2 tablet on Tuesdays and Saturdays Recheck in 6 weeks 

## 2017-09-10 ENCOUNTER — Ambulatory Visit (INDEPENDENT_AMBULATORY_CARE_PROVIDER_SITE_OTHER): Payer: Medicare Other | Admitting: *Deleted

## 2017-09-10 DIAGNOSIS — I4891 Unspecified atrial fibrillation: Secondary | ICD-10-CM

## 2017-09-10 DIAGNOSIS — Z5181 Encounter for therapeutic drug level monitoring: Secondary | ICD-10-CM

## 2017-09-10 DIAGNOSIS — I482 Chronic atrial fibrillation, unspecified: Secondary | ICD-10-CM

## 2017-09-10 LAB — POCT INR: INR: 2.2

## 2017-09-10 NOTE — Patient Instructions (Signed)
Continue coumadin 1 tablet daily except 1/2 tablet on Tuesdays and Saturdays Recheck in 6 weeks 

## 2017-10-22 ENCOUNTER — Ambulatory Visit (INDEPENDENT_AMBULATORY_CARE_PROVIDER_SITE_OTHER): Payer: Medicare Other | Admitting: *Deleted

## 2017-10-22 DIAGNOSIS — I4891 Unspecified atrial fibrillation: Secondary | ICD-10-CM | POA: Diagnosis not present

## 2017-10-22 DIAGNOSIS — I482 Chronic atrial fibrillation, unspecified: Secondary | ICD-10-CM

## 2017-10-22 DIAGNOSIS — Z5181 Encounter for therapeutic drug level monitoring: Secondary | ICD-10-CM | POA: Diagnosis not present

## 2017-10-22 LAB — POCT INR: INR: 1.7

## 2017-10-22 NOTE — Patient Instructions (Signed)
Take coumadin 1 1/2 tablets tonight then resume 1 tablet daily except 1/2 tablet on Tuesdays and Saturdays Recheck in 3 weeks

## 2017-11-19 ENCOUNTER — Ambulatory Visit (INDEPENDENT_AMBULATORY_CARE_PROVIDER_SITE_OTHER): Payer: Medicare Other | Admitting: *Deleted

## 2017-11-19 DIAGNOSIS — I482 Chronic atrial fibrillation, unspecified: Secondary | ICD-10-CM

## 2017-11-19 DIAGNOSIS — I4891 Unspecified atrial fibrillation: Secondary | ICD-10-CM | POA: Diagnosis not present

## 2017-11-19 DIAGNOSIS — Z5181 Encounter for therapeutic drug level monitoring: Secondary | ICD-10-CM

## 2017-11-19 LAB — POCT INR: INR: 1.9 — AB (ref 2.0–3.0)

## 2017-11-19 NOTE — Patient Instructions (Signed)
Increase coumadin to 1 tablet daily except 1/2 tablet on Saturdays Recheck in 4 weeks

## 2017-11-25 ENCOUNTER — Other Ambulatory Visit: Payer: Self-pay | Admitting: Cardiology

## 2017-12-17 ENCOUNTER — Ambulatory Visit (INDEPENDENT_AMBULATORY_CARE_PROVIDER_SITE_OTHER): Payer: Medicare Other | Admitting: *Deleted

## 2017-12-17 DIAGNOSIS — Z5181 Encounter for therapeutic drug level monitoring: Secondary | ICD-10-CM | POA: Diagnosis not present

## 2017-12-17 DIAGNOSIS — I482 Chronic atrial fibrillation, unspecified: Secondary | ICD-10-CM

## 2017-12-17 DIAGNOSIS — I4891 Unspecified atrial fibrillation: Secondary | ICD-10-CM | POA: Diagnosis not present

## 2017-12-17 LAB — POCT INR: INR: 2.1 (ref 2.0–3.0)

## 2017-12-17 NOTE — Patient Instructions (Signed)
Coumadin was increased by PCP to 1 tablet daily Continue coumadin 1 tablet daily  Recheck in 4 weeks

## 2017-12-18 ENCOUNTER — Other Ambulatory Visit: Payer: Self-pay | Admitting: Cardiology

## 2017-12-30 ENCOUNTER — Telehealth: Payer: Self-pay | Admitting: *Deleted

## 2017-12-30 NOTE — Telephone Encounter (Signed)
Spoke with pt and rescheduled visit for July 25th as he is scheduled for surgery on 01/15/2018

## 2017-12-30 NOTE — Telephone Encounter (Signed)
-----   Message from Arnoldo Lenis, MD sent at 12/27/2017  4:40 PM EDT ----- FYI patient is going to hold his coumadin for an upcoming knee surgery, does not need bridge. Can you arrange a f/u to get his levels back after the surgery   J BrancH MD

## 2018-01-09 ENCOUNTER — Ambulatory Visit (INDEPENDENT_AMBULATORY_CARE_PROVIDER_SITE_OTHER): Payer: Medicare Other | Admitting: *Deleted

## 2018-01-09 DIAGNOSIS — I482 Chronic atrial fibrillation, unspecified: Secondary | ICD-10-CM

## 2018-01-09 DIAGNOSIS — Z5181 Encounter for therapeutic drug level monitoring: Secondary | ICD-10-CM | POA: Diagnosis not present

## 2018-01-09 DIAGNOSIS — I4891 Unspecified atrial fibrillation: Secondary | ICD-10-CM | POA: Diagnosis not present

## 2018-01-09 LAB — POCT INR: INR: 3.3 — AB (ref 2.0–3.0)

## 2018-01-09 NOTE — Patient Instructions (Signed)
Coumadin was increased by PCP to 1 tablet daily  Continue coumadin 1 tablet daily.  Pending arthroscopic knee surgery on 7/31.  Will hold coumadin 5 days before procedure.  Take last dose of coumadin tonight then resume 1 tablet daily the night of procedure if ok with MD.   Recheck INR on 01/23/18

## 2018-01-27 ENCOUNTER — Encounter: Payer: Self-pay | Admitting: *Deleted

## 2018-01-28 ENCOUNTER — Other Ambulatory Visit: Payer: Self-pay

## 2018-01-28 ENCOUNTER — Encounter: Payer: Self-pay | Admitting: Cardiology

## 2018-01-28 ENCOUNTER — Encounter

## 2018-01-28 ENCOUNTER — Encounter: Payer: Self-pay | Admitting: *Deleted

## 2018-01-28 ENCOUNTER — Ambulatory Visit (INDEPENDENT_AMBULATORY_CARE_PROVIDER_SITE_OTHER): Payer: Medicare Other | Admitting: Cardiology

## 2018-01-28 ENCOUNTER — Ambulatory Visit (INDEPENDENT_AMBULATORY_CARE_PROVIDER_SITE_OTHER): Payer: Medicare Other | Admitting: *Deleted

## 2018-01-28 VITALS — BP 100/67 | HR 59 | Ht 65.0 in | Wt 167.0 lb

## 2018-01-28 DIAGNOSIS — E782 Mixed hyperlipidemia: Secondary | ICD-10-CM | POA: Diagnosis not present

## 2018-01-28 DIAGNOSIS — I482 Chronic atrial fibrillation, unspecified: Secondary | ICD-10-CM

## 2018-01-28 DIAGNOSIS — I4891 Unspecified atrial fibrillation: Secondary | ICD-10-CM | POA: Diagnosis not present

## 2018-01-28 DIAGNOSIS — I2510 Atherosclerotic heart disease of native coronary artery without angina pectoris: Secondary | ICD-10-CM

## 2018-01-28 DIAGNOSIS — Z5181 Encounter for therapeutic drug level monitoring: Secondary | ICD-10-CM

## 2018-01-28 DIAGNOSIS — I6523 Occlusion and stenosis of bilateral carotid arteries: Secondary | ICD-10-CM | POA: Diagnosis not present

## 2018-01-28 LAB — POCT INR: INR: 2.5 (ref 2.0–3.0)

## 2018-01-28 NOTE — Patient Instructions (Signed)

## 2018-01-28 NOTE — Progress Notes (Signed)
Clinical Summary [name] is a 77 y.o.male  seen today for follow up of the following medical problems.   1. Chronic afib - no recent palpitations. No bleeding on coumadin. DOACs were too expensive in the past, he has elected to remain on coumadin.   2. Carotid stenosis - 02/2015 carotid US: bilateral 1-39% ICA disease. -no recent neuro symptoms.   3. Hyperlipidemia - joint pains on simvaststin, atorvastatin, rosuvastatin, pravastatin. - tolerating zetia which was started last visit, labs followed bypcp   4. CAD - mild disease by cath 2009  - no recent chest pain. No recent SOB/DOE.     5. Recent knee surgery - currently in rehab, doing well.    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 Anxiety disorder   . Atrial fibrillation ([place])    Cardioverted in the past /  atrial fib return... decided rate control..CHADS 2  score is  0.... no Coumadin  . Benign prostatic hypertrophy   . Bilateral cataracts   . CAD (coronary artery disease)    Catheterization 2009, mild CAD  . Carotid artery disease ([place])    Doppler, September, 2011, less than 50% bilateral  . CVA (cerebral infarction)    Hospitalization.  MRI...    Left anterior cerebral artery territory, September, 2011... Coumadin started  . Ejection fraction    EF 60%  . Ejection fraction    EF 55-60%, echo, September, 2011  . Gastroesophageal reflux disease   . Hyperlipidemia   . Ptosis of eyelid, left    Surgery in the past  . S/P appendectomy   . Sweating abnormality    Episodes of sweating appeared to be related to a delayed reaction to niacin     Allergies  Allergen Reactions  . Asa [Aspirin]     GI upset  . Niacin     REACTION: hot flashes     Current Outpatient Medications  Medication Sig Dispense Refill  . albuterol (PROAIR HFA) 108 (90 BASE) MCG/ACT inhaler Inhale 2 puffs into the  lungs every 4 (four) hours as needed for wheezing. 2 puffs every 4 hours as needed only  if your can't catch your breath 1 Inhaler 1  . Calcium Carbonate (CALTRATE 600) 1500 MG TABS Take 2 tablets by mouth Nightly.     . Cetirizine HCl (ZYRTEC ALLERGY) 10 MG CAPS Take 1 capsule by mouth daily.     . Cholecalciferol (VITAMIN D3) 1000 UNITS CAPS Take 2,000 Units by mouth daily.     Marland Kitchen ezetimibe (ZETIA) 10 MG tablet TAKE 1 TABLET EVERY DAY (DISCONTINUE  PRAVASTATIN) 90 tablet 0  . metoprolol (LOPRESSOR) 50 MG tablet Take 50 mg by mouth 2 (two) times daily.    . SYMBICORT 160-4.5 MCG/ACT inhaler INHALE 2 PUFFS INTO THE LUNGS EVERY MORNING -APPOINTMENT WITH DR Melvyn Novas NEEDED FOR FURTHER REFILLS 2 Inhaler 0  . traZODone (DESYREL) 50 MG tablet Take 2 tablets by mouth at bedtime as needed.     . warfarin (COUMADIN) 5 MG tablet Take 1 tablet daily except 1/2 tablet on Saturdays or as directed by Coumadin Clinic 90 tablet 1   No current facility-administered medications for this visit.      Past Surgical History:  Procedure Laterality Date  . APPENDECTOMY       Allergies  Allergen Reactions  . Asa [Aspirin]     GI upset  . Niacin  REACTION: hot flashes      Family History  Problem Relation Age of Onset  . Other Unknown        Insignificant for premature CAD     Social History Mr. Bramer reports that he quit smoking about 27 years ago. His smoking use included cigarettes. He has a 150.00 pack-year smoking history. He quit smokeless tobacco use about 17 years ago.  His smokeless tobacco use included chew. Mr. Selley reports that he does not drink alcohol.   Review of Systems CONSTITUTIONAL: No weight loss, fever, chills, weakness or fatigue.  HEENT: Eyes: No visual loss, blurred vision, double vision or yellow sclerae.No hearing loss, sneezing, congestion, runny nose or sore throat.  SKIN: No rash or itching.  CARDIOVASCULAR: per hpi RESPIRATORY: No shortness of breath, cough or  sputum.  GASTROINTESTINAL: No anorexia, nausea, vomiting or diarrhea. No abdominal pain or blood.  GENITOURINARY: No burning on urination, no polyuria NEUROLOGICAL: No headache, dizziness, syncope, paralysis, ataxia, numbness or tingling in the extremities. No change in bowel or bladder control.  MUSCULOSKELETAL: No muscle, back pain, joint pain or stiffness.  LYMPHATICS: No enlarged nodes. No history of splenectomy.  PSYCHIATRIC: No history of depression or anxiety.  ENDOCRINOLOGIC: No reports of sweating, cold or heat intolerance. No polyuria or polydipsia.  Marland Kitchen   Physical Examination Vitals:   01/28/18 0819  BP: 100/67  Pulse: (!) 59  SpO2: 98%   Vitals:   01/28/18 0819  Weight: 167 lb (75.8 kg)  Height: 5\' 5"  (1.651 m)    Gen: resting comfortably, no acute distress HEENT: no scleral icterus, pupils equal round and reactive, no palptable cervical adenopathy,  CV: irreg, no m/r/g, no jvd Resp: Clear to auscultation bilaterally GI: abdomen is soft, non-tender, non-distended, normal bowel sounds, no hepatosplenomegaly MSK: extremities are warm, no edema.  Skin: warm, no rash Neuro:  no focal deficits Psych: appropriate affect   Diagnostic Studies  02/2015 carotid US: bilateral 1-39% ICA disease. Recs for f/u 2 years  02/2008 cath FINDINGS: The aortic pressure 98/71 with a mean of 83, left ventricular pressure 99/20.  Coronary angiography. The left mainstem is very short. There is no stenosis present.  The LAD is a large-caliber vessel that courses down and reaches the LV apex. The anatomy is a bit unusual as there is a Naylin Burkle off the proximal LAD that runs the course of an intermediate. It supplies multiple Sharaya Boruff vessels to the lateral wall. There has been more of a traditional appearing diagonal Yafet Cline from further down in the mid-LAD. The LAD at the origin of the second diagonal Olney Monier has a 30% stenosis. Further down in the vessel, there is minor  nonobstructive plaque but no significant stenosis in the LAD or its Zayvier Caravello vessels. There are two diagonals in the intermediate territory Rondale Nies but all arising from the proximal and mid LAD.  The left circumflex is very small. It courses down the AV groove and supplies no significant Rahma Meller vessels.  Right coronary artery. The right coronary artery is dominant. Proximally, there is an eccentric 50% stenosis. This is a smooth appearing lesion of the vessels. The midportion of the right coronary artery is dilated. There is a transition zone into more normal- appearing vessel where there is a 30% stenosis. There is a large acute marginal Travas Schexnayder and a moderate size PDA and posterolateral Doye Montilla. There are no other significant stenoses noted. With intracoronary nitroglycerin, the lesion still appears no worse than 50%.  Left ventriculography shows low normal LV  function with an LVEF in the range of 50-55%. There was no significant mitral regurgitation.  ASSESSMENT: 1. Mild-to-moderate right coronary artery stenosis as outlined above. 2. Minimal left anterior descending and left circumflex stenosis. 3. Normal left ventricular function.  Post cat PLAN: Recommend medical therapy for nonobstructive CAD. The patient will resume Coumadin tonight. He will drop his aspirin dose to 81 mg. In reviewing Dr. Ron Parker notes, the patient may not require long-term Coumadin. Recommend aggressive secondary risk reduction for the patient's diffuse nonobstructive CAD.   08/2016 event monitor  Telemetry tracings show rate controlled atrial fibrillation  No symptoms reported    Assessment and Plan  1. Chronic afib -no recent symptoms.  - CHADS2Vasc score is 5, continue coumadin. DOACs have been too expensive.  2. CAD - mild disease on prior cath - no symptoms, continue risk factor modification.   3. HYperlipidemia -intolerant to multiple statins -  toleraint zetia, request labs from pcp.   4. Carotid stenosis - mild bilateral disease -no neuro symptoms, continue to monitor at this time.    F/u 1 year   Arnoldo Lenis, M.D.

## 2018-01-28 NOTE — Patient Instructions (Signed)
Continue coumadin 1 tablet daily Recheck in 3 weeks 

## 2018-02-18 ENCOUNTER — Ambulatory Visit (INDEPENDENT_AMBULATORY_CARE_PROVIDER_SITE_OTHER): Payer: Medicare Other | Admitting: *Deleted

## 2018-02-18 DIAGNOSIS — I482 Chronic atrial fibrillation, unspecified: Secondary | ICD-10-CM

## 2018-02-18 DIAGNOSIS — I4891 Unspecified atrial fibrillation: Secondary | ICD-10-CM

## 2018-02-18 DIAGNOSIS — Z5181 Encounter for therapeutic drug level monitoring: Secondary | ICD-10-CM

## 2018-02-18 LAB — POCT INR: INR: 3.5 — AB (ref 2.0–3.0)

## 2018-02-18 NOTE — Patient Instructions (Signed)
Hold coumadin tonight then decrease dose to 1 tablet daily except 1/2 tablet on Tuedays  Recheck in 3 weeks

## 2018-02-21 ENCOUNTER — Ambulatory Visit (HOSPITAL_COMMUNITY)
Admission: RE | Admit: 2018-02-21 | Discharge: 2018-02-21 | Disposition: A | Discharge status: Home / Self Care | Payer: Medicare Other | Source: Ambulatory Visit | Attending: Orthopedic Surgery | Admitting: Orthopedic Surgery

## 2018-02-21 ENCOUNTER — Other Ambulatory Visit (HOSPITAL_COMMUNITY): Payer: Self-pay | Admitting: Orthopedic Surgery

## 2018-02-21 DIAGNOSIS — M79661 Pain in right lower leg: Secondary | ICD-10-CM | POA: Insufficient documentation

## 2018-02-21 DIAGNOSIS — M7989 Other specified soft tissue disorders: Secondary | ICD-10-CM | POA: Insufficient documentation

## 2018-03-11 ENCOUNTER — Ambulatory Visit (INDEPENDENT_AMBULATORY_CARE_PROVIDER_SITE_OTHER): Payer: Medicare Other | Admitting: *Deleted

## 2018-03-11 DIAGNOSIS — I482 Chronic atrial fibrillation, unspecified: Secondary | ICD-10-CM

## 2018-03-11 DIAGNOSIS — I4891 Unspecified atrial fibrillation: Secondary | ICD-10-CM

## 2018-03-11 DIAGNOSIS — Z5181 Encounter for therapeutic drug level monitoring: Secondary | ICD-10-CM | POA: Diagnosis not present

## 2018-03-11 LAB — POCT INR: INR: 2.8 (ref 2.0–3.0)

## 2018-03-11 NOTE — Patient Instructions (Signed)
Continue coumadin 1 tablet daily except 1/2 tablet on Tuedays  Recheck in 4 weeks

## 2018-03-31 ENCOUNTER — Telehealth: Payer: Self-pay | Admitting: Cardiology

## 2018-03-31 ENCOUNTER — Encounter: Payer: Self-pay | Admitting: *Deleted

## 2018-03-31 DIAGNOSIS — R7303 Prediabetes: Secondary | ICD-10-CM | POA: Insufficient documentation

## 2018-03-31 NOTE — Telephone Encounter (Signed)
Notes requested from [facility].  

## 2018-03-31 NOTE — Telephone Encounter (Signed)
Patient was discharged from [facility] 03-30-18 for A Fib. States that he is still in A. Fib and was told that he needs to see someone this week.  Patient states that he very fatigue and having some shortness of breath.

## 2018-03-31 NOTE — Telephone Encounter (Signed)
Sean Ovens, NP from [facility] called requesting pt be seen in the next week or 2 was d/c'd Saturday still in Afib with rate control - notes already requested and will forward to Dr Harl Bowie on when he could work this pt in next available with extender is 11/26

## 2018-04-01 NOTE — Telephone Encounter (Signed)
Pt aware of appt - forwarded to schedulers to add to schedule

## 2018-04-01 NOTE — Telephone Encounter (Signed)
12pm on Oct 24th   Zandra Abts MD

## 2018-04-02 ENCOUNTER — Telehealth: Payer: Self-pay | Admitting: *Deleted

## 2018-04-02 NOTE — Telephone Encounter (Signed)
Pt has been in hospital for UTI.  D/C home on Saturday 03/29/18.  INR was 2.6  Was d/c home on prednisone taper.  Has 2 days left of 10mg  twice daily.  Coumadin was held on 10/12 - 10/14.  He took 2.5mg  on on 10/15.  Will restart 5mg  daily except 2.5mg  on Tuesdays tonight.  Recheck INR on 10/24.  Pt verbalized understanding.

## 2018-04-02 NOTE — Telephone Encounter (Signed)
Patient recently got out of the hospital and wanted to check and see what dose he needs to be taking of coumadin

## 2018-04-09 ENCOUNTER — Encounter: Payer: Self-pay | Admitting: *Deleted

## 2018-04-10 ENCOUNTER — Encounter: Payer: Self-pay | Admitting: Cardiology

## 2018-04-10 ENCOUNTER — Ambulatory Visit (INDEPENDENT_AMBULATORY_CARE_PROVIDER_SITE_OTHER): Payer: Medicare Other | Admitting: *Deleted

## 2018-04-10 ENCOUNTER — Encounter: Payer: Self-pay | Admitting: *Deleted

## 2018-04-10 ENCOUNTER — Ambulatory Visit (INDEPENDENT_AMBULATORY_CARE_PROVIDER_SITE_OTHER): Payer: Medicare Other | Admitting: Cardiology

## 2018-04-10 VITALS — BP 120/68 | HR 72 | Ht 64.0 in | Wt 168.6 lb

## 2018-04-10 DIAGNOSIS — I2510 Atherosclerotic heart disease of native coronary artery without angina pectoris: Secondary | ICD-10-CM

## 2018-04-10 DIAGNOSIS — E782 Mixed hyperlipidemia: Secondary | ICD-10-CM | POA: Diagnosis not present

## 2018-04-10 DIAGNOSIS — I482 Chronic atrial fibrillation, unspecified: Secondary | ICD-10-CM | POA: Diagnosis not present

## 2018-04-10 DIAGNOSIS — I6523 Occlusion and stenosis of bilateral carotid arteries: Secondary | ICD-10-CM | POA: Diagnosis not present

## 2018-04-10 DIAGNOSIS — Z5181 Encounter for therapeutic drug level monitoring: Secondary | ICD-10-CM

## 2018-04-10 DIAGNOSIS — I4891 Unspecified atrial fibrillation: Secondary | ICD-10-CM

## 2018-04-10 LAB — POCT INR: INR: 3.8 — AB (ref 2.0–3.0)

## 2018-04-10 MED ORDER — METOPROLOL TARTRATE 25 MG PO TABS: 37.5000 mg | ORAL_TABLET | Freq: Two times a day (BID) | ORAL | 1 refills | Status: DC

## 2018-04-10 NOTE — Patient Instructions (Addendum)
Hold coumadin tonight then decrease dose to 1 tablet daily except 1/2 tablet on Tuedays and Fridays Recheck in 2 weeks

## 2018-04-10 NOTE — Progress Notes (Addendum)
Clinical Summary [name] is a 77 y.o.male seen today for follow up of the following medical problems.   1. Chronic afib -  DOACs were too expensive in the past, he has elected to remain on coumadin.  - recent admission with a febrile illness, rigors at [facility]. Caused his afib rates to elevate in the hospital, lopressor was increased to 100mg  bid. Took a few days after discharge, but starting causing significant fatigue.  - he lowered lopressor back to 50mg  bid. Symptoms have improved.   2. Carotid stenosis - 02/2015 carotid US: bilateral 1-39% ICA disease.  3. Hyperlipidemia - joint pains on simvaststin, atorvastatin, rosuvastatin, pravastatin. - tolerating zetia   4. CAD - mild disease by cath 2009 - no recent symptoms.    5. Febrile - recent admission to [facility] with fever, chills, rigors, fatigue - from records does not appear a clear source was indentified. Treated emperically for flu, however flu testing was negative.  - feelling better but ongoing fatigue.   SH: retired Forensic psychologist. Upcoming trip to [facility], often goes to [employer]   Past Medical History:  Diagnosis Date  . Anxiety disorder   . Atrial fibrillation ([place])    Cardioverted in the past /  atrial fib return... decided rate control..CHADS 2  score is  0.... no Coumadin  . Benign prostatic hypertrophy   . Bilateral cataracts   . CAD (coronary artery disease)    Catheterization 2009, mild CAD  . Carotid artery disease ([place])    Doppler, September, 2011, less than 50% bilateral  . CVA (cerebral infarction)    Hospitalization.  MRI...    Left anterior cerebral artery territory, September, 2011... Coumadin started  . Ejection fraction    EF 60%  . Ejection fraction    EF 55-60%, echo, September, 2011  . Gastroesophageal reflux disease   . Hyperlipidemia   . Ptosis of eyelid, left    Surgery in the past  . S/P appendectomy   . Sweating abnormality    Episodes of  sweating appeared to be related to a delayed reaction to niacin     Allergies  Allergen Reactions  . Asa [Aspirin]     GI upset  . Niacin     REACTION: hot flashes     Current Outpatient Medications  Medication Sig Dispense Refill  . albuterol (PROAIR HFA) 108 (90 BASE) MCG/ACT inhaler Inhale 2 puffs into the lungs every 4 (four) hours as needed for wheezing. 2 puffs every 4 hours as needed only  if your can't catch your breath 1 Inhaler 1  . Calcium Carbonate (CALTRATE 600) 1500 MG TABS Take 2 tablets by mouth Nightly.     . Cetirizine HCl (ZYRTEC ALLERGY) 10 MG CAPS Take 1 capsule by mouth daily.     . Cholecalciferol (VITAMIN D3) 1000 UNITS CAPS Take 2,000 Units by mouth daily.     Marland Kitchen ezetimibe (ZETIA) 10 MG tablet TAKE 1 TABLET EVERY DAY (DISCONTINUE  PRAVASTATIN) 90 tablet 0  . metoprolol (LOPRESSOR) 50 MG tablet Take 50 mg by mouth 2 (two) times daily.    . SYMBICORT 160-4.5 MCG/ACT inhaler INHALE 2 PUFFS INTO THE LUNGS EVERY MORNING -APPOINTMENT WITH DR Melvyn Novas NEEDED FOR FURTHER REFILLS 2 Inhaler 0  . traZODone (DESYREL) 50 MG tablet Take 2 tablets by mouth at bedtime as needed.     . warfarin (COUMADIN) 5 MG tablet Take 1 tablet daily except 1/2 tablet on Saturdays or as directed  by [facility] 90 tablet 1   No current facility-administered medications for this visit.      Past Surgical History:  Procedure Laterality Date  . APPENDECTOMY       Allergies  Allergen Reactions  . Asa [Aspirin]     GI upset  . Niacin     REACTION: hot flashes      Family History  Problem Relation Age of Onset  . Other Unknown        Insignificant for premature CAD     Social History Mr. Creighton reports that he quit smoking about 28 years ago. His smoking use included cigarettes. He has a 150.00 pack-year smoking history. He quit smokeless tobacco use about 17 years ago.  His smokeless tobacco use included chew. Mr. Zeien reports that he does not drink alcohol.   Review  of Systems CONSTITUTIONAL: +fatigue.  HEENT: Eyes: No visual loss, blurred vision, double vision or yellow sclerae.No hearing loss, sneezing, congestion, runny nose or sore throat.  SKIN: No rash or itching.  CARDIOVASCULAR: per hpi RESPIRATORY: No shortness of breath, cough or sputum.  GASTROINTESTINAL: No anorexia, nausea, vomiting or diarrhea. No abdominal pain or blood.  GENITOURINARY: No burning on urination, no polyuria NEUROLOGICAL: No headache, dizziness, syncope, paralysis, ataxia, numbness or tingling in the extremities. No change in bowel or bladder control.  MUSCULOSKELETAL: No muscle, back pain, joint pain or stiffness.  LYMPHATICS: No enlarged nodes. No history of splenectomy.  PSYCHIATRIC: No history of depression or anxiety.  ENDOCRINOLOGIC: No reports of sweating, cold or heat intolerance. No polyuria or polydipsia.  Marland Kitchen   Physical Examination Vitals:   04/10/18 1156  BP: 120/68  Pulse: 72  SpO2: 97%   Vitals:   04/10/18 1156  Weight: 168 lb 9.6 oz (76.5 kg)  Height: 5\' 4"  (1.626 m)    Gen: resting comfortably, no acute distress HEENT: no scleral icterus, pupils equal round and reactive, no palptable cervical adenopathy,  CV: irreg, no m/r/g, no jvd Resp: Clear to auscultation bilaterally GI: abdomen is soft, non-tender, non-distended, normal bowel sounds, no hepatosplenomegaly MSK: extremities are warm, no edema.  Skin: warm, no rash Neuro:  no focal deficits Psych: appropriate affect   Diagnostic Studies 02/2015 carotid US: bilateral 1-39% ICA disease. Recs for f/u 2 years  02/2008 cath FINDINGS: The aortic pressure 98/71 with a mean of 83, left ventricular pressure 99/20.  Coronary angiography. The left mainstem is very short. There is no stenosis present.  The LAD is a large-caliber vessel that courses down and reaches the LV apex. The anatomy is a bit unusual as there is a Esterlene Atiyeh off the proximal LAD that runs the course of an  intermediate. It supplies multiple Aeron Donaghey vessels to the lateral wall. There has been more of a traditional appearing diagonal Medea Deines from further down in the mid-LAD. The LAD at the origin of the second diagonal Shiana Rappleye has a 30% stenosis. Further down in the vessel, there is minor nonobstructive plaque but no significant stenosis in the LAD or its [place] Souter vessels. There are two diagonals in the intermediate territory Helix Lafontaine but all arising from the proximal and mid LAD.  The left circumflex is very small. It courses down the AV groove and supplies no significant Casen Pryor vessels.  Right coronary artery. The right coronary artery is dominant. Proximally, there is an eccentric 50% stenosis. This is a smooth appearing lesion of the vessels. The midportion of the right coronary artery is dilated. There is a transition zone into  more normal- appearing vessel where there is a 30% stenosis. There is a large acute marginal Diar Berkel and a moderate size PDA and posterolateral Shametra Cumberland. There are no other significant stenoses noted. With intracoronary nitroglycerin, the lesion still appears no worse than 50%.  Left ventriculography shows low normal LV function with an LVEF in the range of 50-55%. There was no significant mitral regurgitation.  ASSESSMENT: 1. Mild-to-moderate right coronary artery stenosis as outlined above. 2. Minimal left anterior descending and left circumflex stenosis. 3. Normal left ventricular function.  Post cat PLAN: Recommend medical therapy for nonobstructive CAD. The patient will resume Coumadin tonight. He will drop his aspirin dose to 81 mg. In reviewing Dr. Ron Parker notes, the patient may not require long-term Coumadin. Recommend aggressive secondary risk reduction for the patient's diffuse nonobstructive CAD.   08/2016 event monitor  Telemetry tracings show rate controlled atrial fibrillation  No symptoms  reported    Assessment and Plan  1. Chronic afib - CHADS2Vasc score is 5, continue coumadin. DOACs have been too expensive. - recent elevation of heart rates in setting of febrile illness, his rates are back to normal on his previous dosing of lopressor. With some ongoing fatigue try lowering lopressor to 37.5mg  bid though I think more likely symptoms are related to recent febrile illness - he is interested in a watchman device, we will refer to Dr Rayann Heman to discuss - EKG today shows rate controlled afib at  70  2. CAD - mild disease on prior cath - no recent symptoms, continue to monitor.   3. HYperlipidemia -intolerant to multiple statins - continue zetia.   4.Fatigue - no cardiac cause. His afib is back to baseline. Appears to be unexplained recent febrile illness, remants of symptoms ongoing. Defer to pcp    Arnoldo Lenis, M.D.

## 2018-04-10 NOTE — Patient Instructions (Signed)
Your physician wants you to follow-up in: [place] will receive a reminder letter in the mail two months in advance. If you don't receive a letter, please call our office to schedule the follow-up appointment.  Your physician has recommended you make the following change in your medication:   DECREASE LOPRESSOR 37.5 MG (1.5 TABLETS) TWICE DAILY   You have been referred to DR [facility]  Thank you for choosing [facility]!!

## 2018-04-11 ENCOUNTER — Encounter: Payer: Self-pay | Admitting: Cardiology

## 2018-04-24 ENCOUNTER — Ambulatory Visit (INDEPENDENT_AMBULATORY_CARE_PROVIDER_SITE_OTHER): Payer: Medicare Other | Admitting: *Deleted

## 2018-04-24 DIAGNOSIS — Z5181 Encounter for therapeutic drug level monitoring: Secondary | ICD-10-CM

## 2018-04-24 DIAGNOSIS — I482 Chronic atrial fibrillation, unspecified: Secondary | ICD-10-CM

## 2018-04-24 LAB — POCT INR: INR: 2.5 (ref 2.0–3.0)

## 2018-04-24 NOTE — Patient Instructions (Signed)
Continue coumadin 1 tablet daily except 1/2 tablet on Tuedays and Fridays Recheck in 4 weeks

## 2018-04-28 ENCOUNTER — Other Ambulatory Visit: Payer: Self-pay | Admitting: Cardiology

## 2018-05-02 ENCOUNTER — Ambulatory Visit (INDEPENDENT_AMBULATORY_CARE_PROVIDER_SITE_OTHER): Payer: Medicare Other | Admitting: Cardiovascular Disease

## 2018-05-02 ENCOUNTER — Telehealth: Payer: Self-pay | Admitting: Cardiology

## 2018-05-02 DIAGNOSIS — I482 Chronic atrial fibrillation, unspecified: Secondary | ICD-10-CM

## 2018-05-02 DIAGNOSIS — Z5181 Encounter for therapeutic drug level monitoring: Secondary | ICD-10-CM

## 2018-05-02 LAB — POCT INR: INR: 3.4 — AB (ref 2.0–3.0)

## 2018-05-02 NOTE — Telephone Encounter (Signed)
Error

## 2018-05-05 ENCOUNTER — Ambulatory Visit (INDEPENDENT_AMBULATORY_CARE_PROVIDER_SITE_OTHER): Payer: Medicare Other | Admitting: Internal Medicine

## 2018-05-05 ENCOUNTER — Encounter: Payer: Self-pay | Admitting: Internal Medicine

## 2018-05-05 VITALS — BP 116/68 | HR 76 | Ht 65.0 in | Wt 168.0 lb

## 2018-05-05 DIAGNOSIS — I482 Chronic atrial fibrillation, unspecified: Secondary | ICD-10-CM | POA: Diagnosis not present

## 2018-05-05 NOTE — Patient Instructions (Addendum)
Medication Instructions:  Your physician recommends that you continue on your current medications as directed. Please refer to the Current Medication list given to you today.  Labwork: None ordered.  Testing/Procedures: None ordered.  Follow-Up: Your physician wants you to follow-up in: as needed with Dr. Allred.       Any Other Special Instructions Will Be Listed Below (If Applicable).  If you need a refill on your cardiac medications before your next appointment, please call your pharmacy.   

## 2018-05-05 NOTE — Progress Notes (Signed)
Electrophysiology Office Note   Date:  05/05/2018   ID:  [name], [name] 1940/11/16, MRN [id number]  PCP:  Loman Brooklyn, FNP  Cardiologist:  Dr Harl Bowie Primary Electrophysiologist: Thompson Grayer, MD    CC: afib   History of Present Illness: [name] is a 77 y.o. male who presents today for electrophysiology evaluation.   He is referred for EP consultation by Dr Harl Bowie for afib management.  He reports initially being diagnosed with atrial fibrillation 2009.  He has done well with a rate control strategy alone and has been in AF since that time .  He has been treated with coumadin for stroke prevention as he cannot afford DOAC therapy.  He ahs not had problems with bleeding.  He reamins active.  He has fatigue and SOB with moderate activity recently.  He is recovering for URI.  Today, he denies symptoms of palpitations, chest pain, orthopnea, PND, lower extremity edema, claudication, dizziness, presyncope, syncope, bleeding, or neurologic sequela. The patient is tolerating medications without difficulties and is otherwise without complaint today.    Past Medical History:  Diagnosis Date  . Anxiety disorder   . Benign prostatic hypertrophy   . Bilateral cataracts   . CAD (coronary artery disease)    Catheterization 2009, mild CAD  . Carotid artery disease ([place])    Doppler, September, 2011, less than 50% bilateral  . CVA (cerebral infarction)    Hospitalization.  MRI...    Left anterior cerebral artery territory, September, 2011... Coumadin started  . DYSLIPIDEMIA 11/01/2009   Qualifier: Diagnosis of  By: Ron Parker, MD, Leonidas Romberg Dorinda Hill   . Ejection fraction    EF 60%  . Ejection fraction    EF 55-60%, echo, September, 2011  . Gastroesophageal reflux disease   . Hyperlipidemia   . Persistent atrial fibrillation       . Ptosis of eyelid, left    Surgery in the past  . Sweating abnormality    Episodes of sweating appeared to be related to a delayed reaction to  niacin   Past Surgical History:  Procedure Laterality Date  . APPENDECTOMY       Current Outpatient Medications  Medication Sig Dispense Refill  . albuterol (PROAIR HFA) 108 (90 BASE) MCG/ACT inhaler Inhale 2 puffs into the lungs every 4 (four) hours as needed for wheezing. 2 puffs every 4 hours as needed only  if your can't catch your breath 1 Inhaler 1  . Calcium Carbonate (CALTRATE 600) 1500 MG TABS Take 2 tablets by mouth Nightly.     . Cholecalciferol (VITAMIN D3) 1000 UNITS CAPS Take 2,000 Units by mouth daily.     Marland Kitchen ezetimibe (ZETIA) 10 MG tablet TAKE 1 TABLET EVERY DAY (DISCONTINUE PRAVASTATIN) 90 tablet 3  . metoprolol tartrate (LOPRESSOR) 25 MG tablet Take 1.5 tablets (37.5 mg total) by mouth 2 (two) times daily. 270 tablet 1  . SYMBICORT 160-4.5 MCG/ACT inhaler INHALE 2 PUFFS INTO THE LUNGS EVERY MORNING -APPOINTMENT WITH DR Melvyn Novas NEEDED FOR FURTHER REFILLS 2 Inhaler 0  . traZODone (DESYREL) 50 MG tablet Take 2 tablets by mouth at bedtime as needed.     . warfarin (COUMADIN) 5 MG tablet Take 1 tablet daily except 1/2 tablet on Saturdays or as directed by Coumadin Clinic 90 tablet 1   No current facility-administered medications for this visit.     Allergies:   Asa [aspirin] and Niacin   Social History:  The patient  reports that he quit smoking about  28 years ago. His smoking use included cigarettes. He has a 150.00 pack-year smoking history. He quit smokeless tobacco use about 17 years ago.  His smokeless tobacco use included chew. He reports that he does not drink alcohol or use drugs.   Family History:  The patient's family history includes Other in his unknown relative.    ROS:  Please see the history of present illness.   All other systems are personally reviewed and negative.    PHYSICAL EXAM: VS:  BP 116/68   Pulse 76   Ht 5\' 5"  (1.651 m)   Wt 168 lb (76.2 kg)   SpO2 97%   BMI 27.96 kg/m  , BMI Body mass index is 27.96 kg/m. GEN: Well nourished, well  developed, in no acute distress  HEENT: normal  Neck: no JVD, carotid bruits, or masses Cardiac: iRRR; no murmurs, rubs, or gallops,no edema  Respiratory:  Coarse R sided BS which cleared with cough normal work of breathing GI: soft, nontender, nondistended, + BS MS: no deformity or atrophy  Skin: warm and dry  Neuro:  Strength and sensation are intact Psych: euthymic mood, full affect  EKG:  EKG is ordered today. The ekg ordered today is personally reviewed and shows afib, V rate 76 bpm  Wt Readings from Last 3 Encounters:  05/05/18 168 lb (76.2 kg)  04/10/18 168 lb 9.6 oz (76.5 kg)  01/28/18 167 lb (75.8 kg)      Other studies personally reviewed: Additional studies/ records that were reviewed today include: Dr Nelly Laurence ntoes  Review of the above records today demonstrates: as above   ASSESSMENT AND PLAN:  1.  Longstanding persistent afib Rate controlled chads2vasc score is at least 4.  Doing well on coumadin Watchman would be an IIb indication for patients not tolerating coumadin.  He therefore does not meet criteria for this device currently. I have advised that he continue his current strategy and follow-up with Dr Harl Bowie.  I will see as needed going forward   Current medicines are reviewed at length with the patient today.   The patient does not have concerns regarding his medicines.  The following changes were made today:  none  Labs/ tests ordered today include:  Orders Placed This Encounter  Procedures  . EKG 12-Lead     Signed, Thompson Grayer, MD  05/05/2018 11:11 AM     [facility] [address] [place] [address] [phone] (office) [phone] (fax)

## 2018-05-23 ENCOUNTER — Other Ambulatory Visit: Payer: Self-pay | Admitting: Cardiology

## 2018-05-23 ENCOUNTER — Ambulatory Visit (INDEPENDENT_AMBULATORY_CARE_PROVIDER_SITE_OTHER): Payer: Medicare Other | Admitting: *Deleted

## 2018-05-23 DIAGNOSIS — I4821 Permanent atrial fibrillation: Secondary | ICD-10-CM

## 2018-05-23 DIAGNOSIS — I482 Chronic atrial fibrillation, unspecified: Secondary | ICD-10-CM

## 2018-05-23 DIAGNOSIS — Z5181 Encounter for therapeutic drug level monitoring: Secondary | ICD-10-CM

## 2018-05-23 LAB — POCT INR: INR: 2.5 (ref 2.0–3.0)

## 2018-05-23 NOTE — Patient Instructions (Signed)
Continue coumadin 1 tablet daily except 1/2 tablet on Tuedays and Fridays Recheck in 4 weeks

## 2018-05-26 NOTE — Telephone Encounter (Signed)
Refill sent in

## 2018-06-19 ENCOUNTER — Ambulatory Visit (INDEPENDENT_AMBULATORY_CARE_PROVIDER_SITE_OTHER): Payer: Medicare Other | Admitting: Pharmacist

## 2018-06-19 DIAGNOSIS — I482 Chronic atrial fibrillation, unspecified: Secondary | ICD-10-CM | POA: Diagnosis not present

## 2018-06-19 DIAGNOSIS — Z5181 Encounter for therapeutic drug level monitoring: Secondary | ICD-10-CM

## 2018-06-19 LAB — POCT INR: INR: 2 (ref 2.0–3.0)

## 2018-06-19 NOTE — Patient Instructions (Signed)
Description   Continue coumadin 1 tablet daily except 1/2 tablet on Tuedays and Fridays Recheck in 4 weeks

## 2018-06-26 ENCOUNTER — Other Ambulatory Visit (HOSPITAL_COMMUNITY): Payer: Self-pay | Admitting: Family Medicine

## 2018-06-26 DIAGNOSIS — R0602 Shortness of breath: Secondary | ICD-10-CM

## 2018-07-10 ENCOUNTER — Other Ambulatory Visit: Payer: Self-pay

## 2018-07-10 ENCOUNTER — Ambulatory Visit (INDEPENDENT_AMBULATORY_CARE_PROVIDER_SITE_OTHER): Payer: Medicare Other

## 2018-07-10 DIAGNOSIS — R0602 Shortness of breath: Secondary | ICD-10-CM

## 2018-07-11 ENCOUNTER — Ambulatory Visit (INDEPENDENT_AMBULATORY_CARE_PROVIDER_SITE_OTHER): Payer: Medicare Other | Admitting: Urology

## 2018-07-11 ENCOUNTER — Other Ambulatory Visit (HOSPITAL_COMMUNITY)
Admission: AD | Admit: 2018-07-11 | Discharge: 2018-07-11 | Disposition: A | Discharge status: Home / Self Care | Payer: Medicare Other | Source: Other Acute Inpatient Hospital | Attending: Urology | Admitting: Urology

## 2018-07-11 DIAGNOSIS — R3915 Urgency of urination: Secondary | ICD-10-CM

## 2018-07-11 DIAGNOSIS — N401 Enlarged prostate with lower urinary tract symptoms: Secondary | ICD-10-CM

## 2018-07-11 DIAGNOSIS — R972 Elevated prostate specific antigen [PSA]: Secondary | ICD-10-CM | POA: Diagnosis not present

## 2018-07-11 DIAGNOSIS — R3121 Asymptomatic microscopic hematuria: Secondary | ICD-10-CM | POA: Insufficient documentation

## 2018-07-11 LAB — URINALYSIS, COMPLETE (UACMP) WITH MICROSCOPIC
Bacteria, UA: NONE SEEN
Bilirubin Urine: NEGATIVE
Glucose, UA: NEGATIVE mg/dL
Ketones, ur: NEGATIVE mg/dL
Leukocytes, UA: NEGATIVE
Nitrite: NEGATIVE
Protein, ur: NEGATIVE mg/dL
Specific Gravity, Urine: 1.023 (ref 1.005–1.030)
pH: 5 (ref 5.0–8.0)

## 2018-07-17 ENCOUNTER — Ambulatory Visit (INDEPENDENT_AMBULATORY_CARE_PROVIDER_SITE_OTHER): Payer: Medicare Other | Admitting: Pharmacist

## 2018-07-17 DIAGNOSIS — Z5181 Encounter for therapeutic drug level monitoring: Secondary | ICD-10-CM | POA: Diagnosis not present

## 2018-07-17 DIAGNOSIS — I482 Chronic atrial fibrillation, unspecified: Secondary | ICD-10-CM | POA: Diagnosis not present

## 2018-07-17 LAB — POCT INR: INR: 2.6 (ref 2.0–3.0)

## 2018-07-17 NOTE — Patient Instructions (Signed)
Description   Continue coumadin 1 tablet daily except 1/2 tablet on Tuedays and Fridays Recheck in 5 weeks

## 2018-08-07 ENCOUNTER — Encounter: Payer: Self-pay | Admitting: *Deleted

## 2018-08-11 ENCOUNTER — Encounter: Payer: Self-pay | Admitting: *Deleted

## 2018-08-11 ENCOUNTER — Ambulatory Visit (INDEPENDENT_AMBULATORY_CARE_PROVIDER_SITE_OTHER): Payer: Medicare Other | Admitting: Cardiology

## 2018-08-11 ENCOUNTER — Encounter: Payer: Self-pay | Admitting: Cardiology

## 2018-08-11 VITALS — BP 134/80 | HR 68 | Ht 64.0 in | Wt 174.0 lb

## 2018-08-11 DIAGNOSIS — I5033 Acute on chronic diastolic (congestive) heart failure: Secondary | ICD-10-CM

## 2018-08-11 DIAGNOSIS — I482 Chronic atrial fibrillation, unspecified: Secondary | ICD-10-CM

## 2018-08-11 DIAGNOSIS — I2510 Atherosclerotic heart disease of native coronary artery without angina pectoris: Secondary | ICD-10-CM

## 2018-08-11 DIAGNOSIS — E782 Mixed hyperlipidemia: Secondary | ICD-10-CM

## 2018-08-11 MED ORDER — FUROSEMIDE 40 MG PO TABS: ORAL_TABLET | ORAL | 6 refills | Status: DC

## 2018-08-11 NOTE — Progress Notes (Signed)
Clinical Summary [name] is a 78 y.o.male seen today for follow up of the following medical problems.   1. Chronic afib - DOACs were too expensive in the past, he has elected to remain on coumadin. - recent admission with a febrile illness, rigors at [facility]. Caused his afib rates to elevate in the hospital, lopressor was increased to 100mg  bid. Took a few days after discharge, but starting causing significant fatigue.  - he lowered lopressor back to 50mg  bid. Symptoms have improved.   - seen by EP, did not meet criteria to consider watchman device  - no recent palpitations. Fatigue has resolved.  - no issues with coumadin.     2. Carotid stenosis - 02/2015 carotid US: bilateral 1-39% ICA disease.  3. Hyperlipidemia - joint pains on simvaststin, atorvastatin, rosuvastatin, pravastatin. - compliant with zetia, most recent labs with pcp   4. CAD - mild disease by cath 2009  - no recent symptoms.   5. LE edema - reports singificant bilateral leg edema over the last few weeks. Denies any SOB or orthopnea. Weight is up 6 lbs since 04/2018.  - Jan 2020 LVEF 60-65%, cannot eval diastolic function due to afib but sever biatrial enlargements is suggestive.    6. 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 Often goes to [employer]    Past Medical History:  Diagnosis Date  . Anxiety disorder   . Benign prostatic hypertrophy   . Bilateral cataracts   . CAD (coronary artery disease)    Catheterization 2009, mild CAD  . Carotid artery disease ([place])    Doppler, September, 2011, less than 50% bilateral  . CVA (cerebral infarction)    Hospitalization.  MRI...    Left anterior cerebral artery territory, September, 2011... Coumadin started  . DYSLIPIDEMIA 11/01/2009   Qualifier: Diagnosis of  By: Ron Parker, MD, Leonidas Romberg Dorinda Hill   . Ejection fraction    EF 60%  . Ejection fraction      EF 55-60%, echo, September, 2011  . Gastroesophageal reflux disease   . Hyperlipidemia   . Persistent atrial fibrillation       . Ptosis of eyelid, left    Surgery in the past  . Sweating abnormality    Episodes of sweating appeared to be related to a delayed reaction to niacin     Allergies  Allergen Reactions  . Asa [Aspirin]     GI upset  . Niacin     REACTION: hot flashes     Current Outpatient Medications  Medication Sig Dispense Refill  . albuterol (PROAIR HFA) 108 (90 BASE) MCG/ACT inhaler Inhale 2 puffs into the lungs every 4 (four) hours as needed for wheezing. 2 puffs every 4 hours as needed only  if your can't catch your breath 1 Inhaler 1  . Calcium Carbonate (CALTRATE 600) 1500 MG TABS Take 2 tablets by mouth Nightly.     . Cholecalciferol (VITAMIN D3) 1000 UNITS CAPS Take 2,000 Units by mouth daily.     Marland Kitchen ezetimibe (ZETIA) 10 MG tablet TAKE 1 TABLET EVERY DAY (DISCONTINUE PRAVASTATIN) 90 tablet 3  . metoprolol tartrate (LOPRESSOR) 25 MG tablet Take 1.5 tablets (37.5 mg total) by mouth 2 (two) times daily. 270 tablet 1  . SYMBICORT 160-4.5 MCG/ACT inhaler INHALE 2 PUFFS INTO THE LUNGS EVERY MORNING -APPOINTMENT WITH DR Melvyn Novas NEEDED FOR FURTHER REFILLS 2 Inhaler 0  . traZODone (  DESYREL) 50 MG tablet Take 2 tablets by mouth at bedtime as needed.     . warfarin (COUMADIN) 5 MG tablet Take 1 tablet daily except 1/2 tablet on Tuesdays and Fridays or as directd 90 tablet 0   No current facility-administered medications for this visit.      Past Surgical History:  Procedure Laterality Date  . APPENDECTOMY       Allergies  Allergen Reactions  . Asa [Aspirin]     GI upset  . Niacin     REACTION: hot flashes      Family History  Problem Relation Age of Onset  . Other Other        Insignificant for premature CAD     Social History [name] reports that he quit smoking about 28 years ago. His smoking use included cigarettes. He has a 150.00 pack-year  smoking history. He quit smokeless tobacco use about 18 years ago.  His smokeless tobacco use included chew. [name] reports no history of alcohol use.   Review of Systems CONSTITUTIONAL: No weight loss, fever, chills, weakness or fatigue.  HEENT: Eyes: No visual loss, blurred vision, double vision or yellow sclerae.No hearing loss, sneezing, congestion, runny nose or sore throat.  SKIN: No rash or itching.  CARDIOVASCULAR: per hpi RESPIRATORY: No shortness of breath, cough or sputum.  GASTROINTESTINAL: No anorexia, nausea, vomiting or diarrhea. No abdominal pain or blood.  GENITOURINARY: No burning on urination, no polyuria NEUROLOGICAL: No headache, dizziness, syncope, paralysis, ataxia, numbness or tingling in the extremities. No change in bowel or bladder control.  MUSCULOSKELETAL: No muscle, back pain, joint pain or stiffness.  LYMPHATICS: No enlarged nodes. No history of splenectomy.  PSYCHIATRIC: No history of depression or anxiety.  ENDOCRINOLOGIC: No reports of sweating, cold or heat intolerance. No polyuria or polydipsia.  Marland Kitchen   Physical Examination Vitals:   08/11/18 0855  BP: 134/80  Pulse: 68  SpO2: 98%   Vitals:   08/11/18 0855  Weight: 174 lb (78.9 kg)  Height: 5\' 4"  (1.626 m)    Gen: resting comfortably, no acute distress HEENT: no scleral icterus, pupils equal round and reactive, no palptable cervical adenopathy,  CV: irreg, no m/rg Resp: Clear to auscultation bilaterally GI: abdomen is soft, non-tender, non-distended, normal bowel sounds, no hepatosplenomegaly MSK: extremities are warm,2+ bialteral LE edema Skin: warm, no rash Neuro:  no focal deficits Psych: appropriate affect   Diagnostic Studies 02/2015 carotid US: bilateral 1-39% ICA disease. Recs for f/u 2 years  02/2008 cath FINDINGS: The aortic pressure 98/71 with a mean of 83, left ventricular pressure 99/20.  Coronary angiography. The left mainstem is very short. There is  no stenosis present.  The LAD is a large-caliber vessel that courses down and reaches the LV apex. The anatomy is a bit unusual as there is a Veverly Larimer off the proximal LAD that runs the course of an intermediate. It supplies multiple Kaylan Yates vessels to the lateral wall. There has been more of a traditional appearing diagonal Robbin Loughmiller from further down in the mid-LAD. The LAD at the origin of the second diagonal Charlynn Salih has a 30% stenosis. Further down in the vessel, there is minor nonobstructive plaque but no significant stenosis in the LAD or its Sirius Woodford vessels. There are two diagonals in the intermediate territory Debany Vantol but all arising from the proximal and mid LAD.  The left circumflex is very small. It courses down the AV groove and supplies no significant Lenaya Pietsch vessels.  Right coronary artery. The  right coronary artery is dominant. Proximally, there is an eccentric 50% stenosis. This is a smooth appearing lesion of the vessels. The midportion of the right coronary artery is dilated. There is a transition zone into more normal- appearing vessel where there is a 30% stenosis. There is a large acute marginal Cloyde Oregel and a moderate size PDA and posterolateral Sayge Brienza. There are no other significant stenoses noted. With intracoronary nitroglycerin, the lesion still appears no worse than 50%.  Left ventriculography shows low normal LV function with an LVEF in the range of 50-55%. There was no significant mitral regurgitation.  ASSESSMENT: 1. Mild-to-moderate right coronary artery stenosis as outlined above. 2. Minimal left anterior descending and left circumflex stenosis. 3. Normal left ventricular function.  Post cat PLAN: Recommend medical therapy for nonobstructive CAD. The patient will resume Coumadin tonight. He will drop his aspirin dose to 81 mg. In reviewing Dr. Ron Parker notes, the patient may not require long-term Coumadin.  Recommend aggressive secondary risk reduction for the patient's diffuse nonobstructive CAD.   08/2016 event monitor  Telemetry tracings show rate controlled atrial fibrillation  No symptoms reported    Assessment and Plan  1. Chronic afib - CHADS2Vasc score is 5, continue coumadin. DOACs have been too expensive. - no symptoms, continue current meds  2. CAD - mild disease on prior cath - no recent symptoms, continue current meds  3. HYperlipidemia -intolerant to multiple statins, he will continue zetia - request labs from pcp   4. 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  - start lasix 40mg  x3 days, then prn swelling - check BMET/Mg in 2 weeks.     F/u 6 months   Arnoldo Lenis, M.D.

## 2018-08-11 NOTE — Patient Instructions (Signed)
Medication Instructions:   Begin Lasix 40mg  daily x 3 days, then as needed thereafter.  Continue all other medications.    Labwork:  BMET, Mg - orders given today.   Please do in 2 weeks (around 08/25/18).  Office will contact with results via phone or letter.    Testing/Procedures:   Follow-Up: Your physician wants you to follow up in: 6 months.  You will receive a reminder letter in the mail one-two months in advance.  If you don't receive a letter, please call our office to schedule the follow up appointment   Any Other Special Instructions Will Be Listed Below (If Applicable).  If you need a refill on your cardiac medications before your next appointment, please call your pharmacy.

## 2018-08-15 ENCOUNTER — Ambulatory Visit (INDEPENDENT_AMBULATORY_CARE_PROVIDER_SITE_OTHER): Payer: Medicare Other | Admitting: Urology

## 2018-08-15 DIAGNOSIS — R3121 Asymptomatic microscopic hematuria: Secondary | ICD-10-CM

## 2018-08-15 DIAGNOSIS — R3915 Urgency of urination: Secondary | ICD-10-CM | POA: Diagnosis not present

## 2018-08-15 DIAGNOSIS — R972 Elevated prostate specific antigen [PSA]: Secondary | ICD-10-CM | POA: Diagnosis not present

## 2018-08-15 DIAGNOSIS — N401 Enlarged prostate with lower urinary tract symptoms: Secondary | ICD-10-CM | POA: Diagnosis not present

## 2018-08-18 ENCOUNTER — Inpatient Hospital Stay (HOSPITAL_COMMUNITY)
Admission: EM | Admit: 2018-08-18 | Discharge: 2018-08-21 | DRG: 854 | Disposition: A | Discharge status: Home / Self Care | Payer: Medicare Other | Source: Other Acute Inpatient Hospital | Attending: Internal Medicine | Admitting: Internal Medicine

## 2018-08-18 ENCOUNTER — Other Ambulatory Visit: Payer: Self-pay

## 2018-08-18 ENCOUNTER — Encounter (HOSPITAL_COMMUNITY): Payer: Self-pay

## 2018-08-18 DIAGNOSIS — A419 Sepsis, unspecified organism: Secondary | ICD-10-CM | POA: Diagnosis present

## 2018-08-18 DIAGNOSIS — N400 Enlarged prostate without lower urinary tract symptoms: Secondary | ICD-10-CM | POA: Diagnosis present

## 2018-08-18 DIAGNOSIS — J449 Chronic obstructive pulmonary disease, unspecified: Secondary | ICD-10-CM | POA: Diagnosis present

## 2018-08-18 DIAGNOSIS — Z886 Allergy status to analgesic agent status: Secondary | ICD-10-CM | POA: Diagnosis not present

## 2018-08-18 DIAGNOSIS — Z888 Allergy status to other drugs, medicaments and biological substances status: Secondary | ICD-10-CM | POA: Diagnosis not present

## 2018-08-18 DIAGNOSIS — I5032 Chronic diastolic (congestive) heart failure: Secondary | ICD-10-CM | POA: Diagnosis present

## 2018-08-18 DIAGNOSIS — N390 Urinary tract infection, site not specified: Secondary | ICD-10-CM | POA: Diagnosis present

## 2018-08-18 DIAGNOSIS — R509 Fever, unspecified: Secondary | ICD-10-CM | POA: Diagnosis present

## 2018-08-18 DIAGNOSIS — N412 Abscess of prostate: Secondary | ICD-10-CM | POA: Diagnosis present

## 2018-08-18 DIAGNOSIS — Z7901 Long term (current) use of anticoagulants: Secondary | ICD-10-CM

## 2018-08-18 DIAGNOSIS — K219 Gastro-esophageal reflux disease without esophagitis: Secondary | ICD-10-CM | POA: Diagnosis present

## 2018-08-18 DIAGNOSIS — Z87891 Personal history of nicotine dependence: Secondary | ICD-10-CM | POA: Diagnosis not present

## 2018-08-18 DIAGNOSIS — E785 Hyperlipidemia, unspecified: Secondary | ICD-10-CM | POA: Diagnosis present

## 2018-08-18 DIAGNOSIS — I4819 Other persistent atrial fibrillation: Secondary | ICD-10-CM | POA: Diagnosis present

## 2018-08-18 DIAGNOSIS — I351 Nonrheumatic aortic (valve) insufficiency: Secondary | ICD-10-CM | POA: Diagnosis present

## 2018-08-18 DIAGNOSIS — I2510 Atherosclerotic heart disease of native coronary artery without angina pectoris: Secondary | ICD-10-CM | POA: Diagnosis present

## 2018-08-18 DIAGNOSIS — I4891 Unspecified atrial fibrillation: Secondary | ICD-10-CM | POA: Diagnosis present

## 2018-08-18 DIAGNOSIS — Z8673 Personal history of transient ischemic attack (TIA), and cerebral infarction without residual deficits: Secondary | ICD-10-CM | POA: Diagnosis not present

## 2018-08-18 LAB — CBC WITH DIFFERENTIAL/PLATELET
Abs Immature Granulocytes: 1.24 10*3/uL — ABNORMAL HIGH (ref 0.00–0.07)
BASOS ABS: 0.1 10*3/uL (ref 0.0–0.1)
Basophils Relative: 0 %
Eosinophils Absolute: 0 10*3/uL (ref 0.0–0.5)
Eosinophils Relative: 0 %
HCT: 44.2 % (ref 39.0–52.0)
Hemoglobin: 13.9 g/dL (ref 13.0–17.0)
IMMATURE GRANULOCYTES: 5 %
Lymphocytes Relative: 5 %
Lymphs Abs: 1.5 10*3/uL (ref 0.7–4.0)
MCH: 29.3 pg (ref 26.0–34.0)
MCHC: 31.4 g/dL (ref 30.0–36.0)
MCV: 93.1 fL (ref 80.0–100.0)
Monocytes Absolute: 1.9 10*3/uL — ABNORMAL HIGH (ref 0.1–1.0)
Monocytes Relative: 7 %
NEUTROS ABS: 22.7 10*3/uL — AB (ref 1.7–7.7)
NEUTROS PCT: 83 %
Platelets: 174 10*3/uL (ref 150–400)
RBC: 4.75 MIL/uL (ref 4.22–5.81)
RDW: 14.1 % (ref 11.5–15.5)
WBC: 27.4 10*3/uL — ABNORMAL HIGH (ref 4.0–10.5)
nRBC: 0 % (ref 0.0–0.2)

## 2018-08-18 LAB — PROTIME-INR
INR: 2 — ABNORMAL HIGH (ref 0.8–1.2)
Prothrombin Time: 22 seconds — ABNORMAL HIGH (ref 11.4–15.2)

## 2018-08-18 LAB — BASIC METABOLIC PANEL
ANION GAP: 9 (ref 5–15)
BUN: 19 mg/dL (ref 8–23)
CO2: 27 mmol/L (ref 22–32)
Calcium: 8.6 mg/dL — ABNORMAL LOW (ref 8.9–10.3)
Chloride: 100 mmol/L (ref 98–111)
Creatinine, Ser: 1.13 mg/dL (ref 0.61–1.24)
GFR calc Af Amer: >60 mL/min (ref >60)
GFR calc non Af Amer: >60 mL/min (ref >60)
Glucose, Bld: 117 mg/dL — ABNORMAL HIGH (ref 70–99)
Potassium: 3.1 mmol/L — ABNORMAL LOW (ref 3.5–5.1)
Sodium: 136 mmol/L (ref 135–145)

## 2018-08-18 LAB — URINALYSIS, ROUTINE W REFLEX MICROSCOPIC
Bilirubin Urine: NEGATIVE
Glucose, UA: NEGATIVE mg/dL
KETONES UR: NEGATIVE mg/dL
Leukocytes,Ua: NEGATIVE
NITRITE: NEGATIVE
Protein, ur: 30 mg/dL — AB
Specific Gravity, Urine: 1.025 (ref 1.005–1.030)
pH: 5 (ref 5.0–8.0)

## 2018-08-18 LAB — LACTIC ACID, PLASMA: Lactic Acid, Venous: 1.8 mmol/L (ref 0.5–1.9)

## 2018-08-18 MED ORDER — TAMSULOSIN HCL 0.4 MG PO CAPS
0.4000 mg | ORAL_CAPSULE | Freq: Every day | ORAL | Status: DC
  Administered 2018-08-19 – 2018-08-21 (×3): 0.4 mg via ORAL
  Filled 2018-08-18 (×3): qty 1

## 2018-08-18 MED ORDER — SODIUM CHLORIDE 0.9 % IV SOLN
INTRAVENOUS | Status: DC
  Administered 2018-08-18 – 2018-08-20 (×4): via INTRAVENOUS

## 2018-08-18 MED ORDER — EZETIMIBE 10 MG PO TABS
10.0000 mg | ORAL_TABLET | Freq: Every day | ORAL | Status: DC
  Administered 2018-08-19 – 2018-08-21 (×3): 10 mg via ORAL
  Filled 2018-08-18 (×3): qty 1

## 2018-08-18 MED ORDER — ALBUTEROL SULFATE (2.5 MG/3ML) 0.083% IN NEBU: 2.5000 mg | INHALATION_SOLUTION | RESPIRATORY_TRACT | Status: DC | PRN

## 2018-08-18 MED ORDER — MOMETASONE FURO-FORMOTEROL FUM 200-5 MCG/ACT IN AERO
2.0000 | INHALATION_SPRAY | Freq: Two times a day (BID) | RESPIRATORY_TRACT | Status: DC
  Filled 2018-08-18: qty 8.8

## 2018-08-18 MED ORDER — SODIUM CHLORIDE 0.9 % IV SOLN
1.0000 g | Freq: Three times a day (TID) | INTRAVENOUS | Status: DC
  Administered 2018-08-18 – 2018-08-21 (×8): 1 g via INTRAVENOUS
  Filled 2018-08-18 (×10): qty 1

## 2018-08-18 MED ORDER — POTASSIUM CHLORIDE CRYS ER 20 MEQ PO TBCR
40.0000 meq | EXTENDED_RELEASE_TABLET | Freq: Once | ORAL | Status: AC
  Administered 2018-08-18: 40 meq via ORAL
  Filled 2018-08-18: qty 2

## 2018-08-18 MED ORDER — TRAZODONE HCL 100 MG PO TABS: 100.0000 mg | ORAL_TABLET | Freq: Every evening | ORAL | Status: DC | PRN

## 2018-08-18 MED ORDER — SODIUM CHLORIDE 0.9 % IV SOLN
1.0000 g | INTRAVENOUS | Status: DC
  Administered 2018-08-18: 1 g via INTRAVENOUS
  Filled 2018-08-18: qty 1

## 2018-08-18 MED ORDER — METOPROLOL TARTRATE 25 MG PO TABS
37.5000 mg | ORAL_TABLET | Freq: Two times a day (BID) | ORAL | Status: DC
  Administered 2018-08-18 – 2018-08-21 (×6): 37.5 mg via ORAL
  Filled 2018-08-18 (×7): qty 2

## 2018-08-18 NOTE — Progress Notes (Signed)
Urologist Garren upon floor and assessed pt. Prior to leaving MD requested this RN to page hospitalist to make them aware of Rx change for pt's antibiotics to meropenem. On call NP Schorr paged and made aware. Will continue to monitor.

## 2018-08-18 NOTE — Consult Note (Addendum)
Urology Consult Note   Requesting Attending Physician:  Shelly Coss, MD Service Providing Consult: Urology  Consulting Attending: Dr. Tawanna Solo   Reason for Consult:  Prostate abscess  HPI: [name] is seen in consultation for reasons noted above at the request of Shelly Coss, MD for evaluation of a prostate abscess.  This is a 78 y.o. male with a history of Atrial fibrillation (on Warfarin), stroke, carotid stenosis, COPD, CAD. He was transferred from [facility] on 08/19/18 with concern for a prostate abscess. He was recently evaluated by Dr. Jeffie Pollock for hematuria 3 days ago and had cystoscopy performed. This was uncomplicated. Unfortunately, he began having fevers, difficulty urinating which prompted hospital admission on Saturday. Also with new loose stools with mucous discharge.   He  underwent CT scan which noted "enlarged prostate gland with surrounding inflammatory changes, marked enlargement of the left lateral prostate gland where there is a focal contour deformity with central low attenuation, suspicious for abscess." also, " peripherally enhancing fluid collection at this location measuring ~ 4 cm". The disc came with him which was sent for upload.  UA (08/17/18): moderate LE, TNTC WBC, 10-20 RBC, moderate bacteria WBC (08/17/18): 20.2  Lactate (08/17/18): 2.3   He was started on IV fluids and ceftriaxone which was continued upon transfer to [facility].   On arrival here he was hemodynamically stable, afebrile, normal HR and BP.  He does have a leukocytosis of 27.4, which is increased from prior Blood cultures were sent No UA or urine culture sent here  He does mention having a complicated UTI last year that required 3 day hospitalization. He has never had an abscess. Denies any prior urologic procedures other than above.   Past Medical History: Past Medical History:  Diagnosis Date  . Anxiety disorder   . Benign prostatic hypertrophy   . Bilateral cataracts   .  CAD (coronary artery disease)    Catheterization 2009, mild CAD  . Carotid artery disease ([place])    Doppler, September, 2011, less than 50% bilateral  . CVA (cerebral infarction)    Hospitalization.  MRI...    Left anterior cerebral artery territory, September, 2011... Coumadin started  . DYSLIPIDEMIA 11/01/2009   Qualifier: Diagnosis of  By: Ron Parker, MD, Leonidas Romberg Dorinda Hill   . Ejection fraction    EF 60%  . Ejection fraction    EF 55-60%, echo, September, 2011  . Gastroesophageal reflux disease   . Hyperlipidemia   . Persistent atrial fibrillation       . Ptosis of eyelid, left    Surgery in the past  . Sweating abnormality    Episodes of sweating appeared to be related to a delayed reaction to niacin    Past Surgical History:  Past Surgical History:  Procedure Laterality Date  . APPENDECTOMY      Medication: Current Facility-Administered Medications  Medication Dose Route Frequency Provider Last Rate Last Dose  . 0.9 %  sodium chloride infusion   Intravenous Continuous Shelly Coss, MD 75 mL/hr at 08/18/18 1830    . albuterol (PROVENTIL) (2.5 MG/3ML) 0.083% nebulizer solution 2.5 mg  2.5 mg Inhalation Q4H PRN Shelly Coss, MD      . cefTRIAXone (ROCEPHIN) 1 g in sodium chloride 0.9 % 100 mL IVPB  1 g Intravenous Q24H Shelly Coss, MD 200 mL/hr at 08/18/18 1829 1 g at 08/18/18 1829  . [START ON 08/19/2018] ezetimibe (ZETIA) tablet 10 mg  10 mg Oral Daily Shelly Coss, MD      .  metoprolol tartrate (LOPRESSOR) tablet 37.5 mg  37.5 mg Oral BID Adhikari, Amrit, MD      . mometasone-formoterol (DULERA) 200-5 MCG/ACT inhaler 2 puff  2 puff Inhalation BID Shelly Coss, MD      . traZODone (DESYREL) tablet 100 mg  100 mg Oral QHS PRN Shelly Coss, MD        Allergies: Allergies  Allergen Reactions  . Asa [Aspirin]     GI upset  . Niacin     REACTION: hot flashes    Social History: Social History   Tobacco Use  . Smoking status: Former Smoker    Packs/day:  3.00    Years: 50.00    Pack years: 150.00    Types: Cigarettes    Last attempt to quit: 03/18/1990    Years since quitting: 28.4  . Smokeless tobacco: Former Systems developer    Types: [place] date: 06/18/2000  Substance Use Topics  . Alcohol use: No    Alcohol/week: 0.0 standard drinks  . Drug use: No    Family History Family History  Problem Relation Age of Onset  . Other Other        Insignificant for premature CAD    Review of Systems 10 systems were reviewed and are negative except as noted specifically in the HPI.  Objective   Vital signs in last 24 hours: BP 133/68 (BP Location: Left Arm)   Pulse 85   Temp 98.4 F (36.9 C) (Oral)   Resp 20   Ht 5\' 5"  (1.651 m)   Wt 75.8 kg   SpO2 95% Comment: 95  BMI 27.81 kg/m   Physical Exam General: NAD, A&O, resting, appropriate HEENT: [place]/AT, EOMI, MMM Pulmonary: Normal work of breathing Cardiovascular: HDS, adequate peripheral perfusion Abdomen: Soft, NTTP, nondistended, . GU: DRE reveals large 50-60 gram gland with left hemigland tenderness. Nonboggy prostate. No other masses or nodules.  Extremities: warm and well perfused Neuro: Appropriate, no focal neurological deficits  Most Recent Labs: Lab Results  Component Value Date   WBC 27.4 (H) 08/18/2018   HGB 13.9 08/18/2018   HCT 44.2 08/18/2018   PLT 174 08/18/2018    Lab Results  Component Value Date   NA 136 08/18/2018   K 3.1 (L) 08/18/2018   CL 100 08/18/2018   CO2 27 08/18/2018   BUN 19 08/18/2018   CREATININE 1.13 08/18/2018   CALCIUM 8.6 (L) 08/18/2018    Lab Results  Component Value Date   INR 2.0 (H) 08/18/2018     IMAGING: No results found.  ------  Assessment:  78 y.o. male with ~ 4 cm prostate abscess following recent GU instrumentation. Concern for urosepsis. Currently afebrile and stable, although with uptrending leukocytosis and minimal improvement on antibiotics   Recommendations: - Would recommend switching to IV meropenem  -  Hold Warfarin  - Keep NPO at MN - Upload CT disc  - Send formal urine culture. I saw outside UA sent but could not find pending urine culture - We will review imaging and consider OR procedure tomorrow. TRUS needle compression vs. TUR. Spoke with patient regarding these possibilities    Thank you for this consult. Please contact the urology consult pager with any further questions/concerns.

## 2018-08-18 NOTE — Progress Notes (Signed)
Pharmacy Antibiotic Note  [name] is a 78 y.o. male  transferred from [facility] to [facility] on 3/2 for workup and management of prostatic abscess. Plan is for possible surgical intervention on 3/3.  To change abx from ceftriaxone to meropenem per urology recom.   Plan: - meropenem 1gm IV q8h _____________________________________  Height: 5\' 5"  (165.1 cm) Weight: 167 lb 1.7 oz (75.8 kg) IBW/kg (Calculated) : 61.5  Temp (24hrs), Avg:99.1 F (37.3 C), Min:98.4 F (36.9 C), Max:99.7 F (37.6 C)  Recent Labs  Lab 08/18/18 1818 08/18/18 1819  WBC 27.4*  --   CREATININE 1.13  --   LATICACIDVEN  --  1.8    Estimated Creatinine Clearance: 50.4 mL/min (by C-G formula based on SCr of 1.13 mg/dL).    Allergies  Allergen Reactions  . Asa [Aspirin]     GI upset  . Niacin     REACTION: hot flashes     Thank you for allowing pharmacy to be a part of this patient's care.  Lynelle Doctor 08/18/2018 9:43 PM

## 2018-08-18 NOTE — Progress Notes (Signed)
[place] for warfarin Indication: hx atrial fibrillation and stroke  Allergies  Allergen Reactions  . Asa [Aspirin]     GI upset  . Niacin     REACTION: hot flashes    Patient Measurements: Height: 5\' 5"  (165.1 cm) Weight: 167 lb 1.7 oz (75.8 kg) IBW/kg (Calculated) : 61.5 Heparin Dosing Weight:   Vital Signs: Temp: 98.4 F (36.9 C) (03/02 1742) Temp Source: Oral (03/02 1742) BP: 133/68 (03/02 1742) Pulse Rate: 85 (03/02 1742)  Labs: No results for input(s): HGB, HCT, PLT, APTT, LABPROT, INR, HEPARINUNFRC, HEPRLOWMOCWT, CREATININE, CKTOTAL, CKMB, TROPONINI in the last 72 hours.  CrCl cannot be calculated (No successful lab value found.).   Medications:  PTA warfarin regimen: 5 mg daily except 2.5 mg on Tue and Fri (last dose taken on 3/2 at 0930)  Assessment: Patient's a 78 y.o M with hx afib and CVA on warfarin PTA, transferred from [facility] to [facility] on 3/2 for workup and management of prostatic abscess.  Of note, patient recently had a cystoscopy for workup of hematuria.  Per pt, hematuria has resolved.  To resume warfarin inpatient.  Today, 08/18/2018: - INR is therapeutic at 2 - cbc ok  Goal of Therapy:  INR 2-3 Monitor platelets by anticoagulation protocol: Yes   Plan:  - Patient already took her dose for today. F/u with AM labs and will dose warfarin based on INR - monitor for s/s bleeding  [name] P 08/18/2018,6:40 PM

## 2018-08-18 NOTE — H&P (Signed)
History and Physical    [name] NTI:[id number] DOB: Dec 29, 1940 DOA: 08/18/2018  PCP: Loman Brooklyn, FNP   Patient coming from: [facility] hospital    Chief Complaint: Fever, chills  HPI: [name] is a 78 y.o. male with medical history significant of chronic A. fib, carotid stenosis, hyperlipidemia, coronary disease, aortic regurgitation, COPD who was sent from for direct admission from [facility] for the evaluation of possible prostatic abscess. Patient presented to that hospital on last Friday with complaints of fever and chills.  Patient reports that he had cystoscopy by Dr. Roni Bread, urologist, for hematuria 3 days ago.  Not sure about this findings of cystoscopy but he says that the hematuria has resolved.  After he went home on Friday after cystoscopy, he started having difficulty urination along with fever and chills.  He presented to the hospital on Saturday.  On presentation his blood pressure was low but improved with IV fluids.  His lactic acid was found to be in the level of 2.3.  He was a started on ceftriaxone.  CT imaging showed prostatic abscess.  Physician from [facility] sent him for urology evaluation and possible intervention to [facility].  They discussed with Dr. Louis Meckel. Patient seen and examined the bedside on the floor.  Currently he is hemodynamically stable.  He had mild grade fever on presentation which was resolved with Tylenol.  Looked comfortable during my evaluation.  He denies any chest pain, shortness of breath, abdominal pain, nausea, vomiting, headache. He had no problem passing urine after he presented here but complains of dysuria.  He denies any hematuria right now. Patient is a past smoker and has been diagnosed with COPD.  Currently on Symbicort at home.  Follows with pulmonology.  Follows with cardiology for his chronic A. Fib.  Past Medical History:  Diagnosis Date  . Anxiety disorder   . Benign prostatic hypertrophy   .  Bilateral cataracts   . CAD (coronary artery disease)    Catheterization 2009, mild CAD  . Carotid artery disease ([place])    Doppler, September, 2011, less than 50% bilateral  . CVA (cerebral infarction)    Hospitalization.  MRI...    Left anterior cerebral artery territory, September, 2011... Coumadin started  . DYSLIPIDEMIA 11/01/2009   Qualifier: Diagnosis of  By: Ron Parker, MD, Leonidas Romberg Dorinda Hill   . Ejection fraction    EF 60%  . Ejection fraction    EF 55-60%, echo, September, 2011  . Gastroesophageal reflux disease   . Hyperlipidemia   . Persistent atrial fibrillation       . Ptosis of eyelid, left    Surgery in the past  . Sweating abnormality    Episodes of sweating appeared to be related to a delayed reaction to niacin    Past Surgical History:  Procedure Laterality Date  . APPENDECTOMY       reports that he quit smoking about 28 years ago. His smoking use included cigarettes. He has a 150.00 pack-year smoking history. He quit smokeless tobacco use about 18 years ago.  His smokeless tobacco use included chew. He reports that he does not drink alcohol or use drugs.  Allergies  Allergen Reactions  . Asa [Aspirin]     GI upset  . Niacin     REACTION: hot flashes    Family History  Problem Relation Age of Onset  . Other Other        Insignificant for premature CAD  Prior to Admission medications   Medication Sig Start Date End Date Taking? Authorizing Provider  albuterol (PROAIR HFA) 108 (90 BASE) MCG/ACT inhaler Inhale 2 puffs into the lungs every 4 (four) hours as needed for wheezing. 2 puffs every 4 hours as needed only  if your can't catch your breath 03/09/13   Tanda Rockers, MD  ezetimibe (ZETIA) 10 MG tablet TAKE 1 TABLET EVERY DAY (DISCONTINUE PRAVASTATIN) 04/28/18   Branch, Alphonse Guild, MD  furosemide (LASIX) 40 MG tablet Take one tab daily x 3 days, then only as needed thereafter for swelling. 08/11/18   Branch, Alphonse Guild, MD  metoprolol tartrate  (LOPRESSOR) 25 MG tablet Take 1.5 tablets (37.5 mg total) by mouth 2 (two) times daily. 04/10/18 08/11/18  Arnoldo Lenis, MD  SYMBICORT 160-4.5 MCG/ACT inhaler INHALE 2 PUFFS INTO THE LUNGS EVERY MORNING -APPOINTMENT WITH DR Melvyn Novas NEEDED FOR FURTHER REFILLS 04/30/14   Tanda Rockers, MD  traZODone (DESYREL) 50 MG tablet Take 2 tablets by mouth at bedtime as needed.  10/21/12   [provider]  warfarin (COUMADIN) 5 MG tablet Take 1 tablet daily except 1/2 tablet on Tuesdays and Fridays or as directd 05/26/18   Arnoldo Lenis, MD    Physical Exam: Vitals:   08/18/18 1742  BP: 133/68  Pulse: 85  Resp: 20  Temp: 98.4 F (36.9 C)  TempSrc: Oral  SpO2: 95%  Weight: 75.8 kg  Height: 5\' 5"  (1.651 m)    Constitutional: NAD, calm, comfortable, pleasant elderly gentleman Vitals:   08/18/18 1742  BP: 133/68  Pulse: 85  Resp: 20  Temp: 98.4 F (36.9 C)  TempSrc: Oral  SpO2: 95%  Weight: 75.8 kg  Height: 5\' 5"  (1.651 m)   Eyes: PERRL, lids and conjunctivae normal ENMT: Mucous membranes are moist. Posterior pharynx clear of any exudate or lesions.Normal dentition.  Neck: normal, supple, no masses, no thyromegaly Respiratory: clear to auscultation bilaterally, no wheezing, no crackles. Normal respiratory effort. No accessory muscle use.  Cardiovascular: A. fib, no murmurs / rubs / gallops. No extremity edema. 2+ pedal pulses. No carotid bruits.  Abdomen: no tenderness, no masses palpated. No hepatosplenomegaly. Bowel sounds positive.  Musculoskeletal: no clubbing / cyanosis. No joint deformity upper and lower extremities. Good ROM, no contractures. Normal muscle tone.  Skin: no rashes, lesions, ulcers. No induration Neurologic: CN 2-12 grossly intact. Sensation intact, DTR normal. Strength 5/5 in all 4.  Psychiatric: Normal judgment and insight. Alert and oriented x 3. Normal mood.   Foley Catheter:None  Labs on Admission: I have personally reviewed following labs and  imaging studies  CBC: No results for input(s): WBC, NEUTROABS, HGB, HCT, MCV, PLT in the last 168 hours. Basic Metabolic Panel: No results for input(s): NA, K, CL, CO2, GLUCOSE, BUN, CREATININE, CALCIUM, MG, PHOS in the last 168 hours. GFR: CrCl cannot be calculated (No successful lab value found.). Liver Function Tests: No results for input(s): AST, ALT, ALKPHOS, BILITOT, PROT, ALBUMIN in the last 168 hours. No results for input(s): LIPASE, AMYLASE in the last 168 hours. No results for input(s): AMMONIA in the last 168 hours. Coagulation Profile: No results for input(s): INR, PROTIME in the last 168 hours. Cardiac Enzymes: No results for input(s): CKTOTAL, CKMB, CKMBINDEX, TROPONINI in the last 168 hours. BNP (last 3 results) No results for input(s): PROBNP in the last 8760 hours. HbA1C: No results for input(s): HGBA1C in the last 72 hours. CBG: No results for input(s): GLUCAP in the last 168 hours.  Lipid Profile: No results for input(s): CHOL, HDL, LDLCALC, TRIG, CHOLHDL, LDLDIRECT in the last 72 hours. Thyroid Function Tests: No results for input(s): TSH, T4TOTAL, FREET4, T3FREE, THYROIDAB in the last 72 hours. Anemia Panel: No results for input(s): VITAMINB12, FOLATE, FERRITIN, TIBC, IRON, RETICCTPCT in the last 72 hours. Urine analysis:    Component Value Date/Time   COLORURINE YELLOW 07/11/2018 [place] 07/11/2018 1540   LABSPEC 1.023 07/11/2018 1540   PHURINE 5.0 07/11/2018 1540   GLUCOSEU NEGATIVE 07/11/2018 1540   HGBUR SMALL (A) 07/11/2018 1540   BILIRUBINUR NEGATIVE 07/11/2018 1540   KETONESUR NEGATIVE 07/11/2018 1540   PROTEINUR NEGATIVE 07/11/2018 1540   NITRITE NEGATIVE 07/11/2018 [place] 07/11/2018 1540    Radiological Exams on Admission: No results found.   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  Started on ceftriaxone.  Will get blood cultures, urine cultures. Imaging at outside hospital showed prostatic abscess, could not retrieve the imagings here We will get a stat CBC, BMP and lactic acid level. Continue IV fluids. I have discussed with urology will follow him here.  We will keep him n.p.o. after midnight just in case if urology plans for procedure tomorrow.  Chronic A. fib: Heart rate in the range of 100-110.CHADS2Vasc score is 5.  Restarted his home dose metoprolol.  On warfarin for anticoagulation.  Continue to monitor daily INR.  We will monitor him on telemetry. Follows with cardiology, Dr. Harl Bowie at [facility]  History of coronary artery disease: Mild disease as per prior catheterization.  Continue medical management.  No recent symptoms.  Hyperlipidemia: Intolerant to multiple statins.  On Zetia which we will continue.  History of chronic diastolic CHF: History of diastolic CHF as per his cardiologist's note .Currently euvolemic.  On Lasix as needed at home.Continue IV fluids for now.  COPD: Currently stable.  Continue PRN bronchodilators.  On Symbicort at home .  Follows with pulmonology as an outpatient.  Past smoker.  Severity of Illness: The appropriate patient status for this patient is INPATIENT. Patient needs IV antibiotics.  Presented with septic features with hypotension, fever and chills.  Needs close monitoring as inpatient.  DVT prophylaxis: Warfarin Code Status: Full Family Communication: Family present at the bedside Consults called: Urology     Shelly Coss MD [facility] Pager [phone]  If 7PM-7AM, please contact night-coverage [email or web address] Password [facility]  08/18/2018, 6:16 PM

## 2018-08-18 NOTE — Progress Notes (Signed)
Received from [facility]. 78 yo male. A/o x 4. Ambulated to bed w/ help from stretcher. Voices no c/o. Family at bs. Closely monitoring.

## 2018-08-19 ENCOUNTER — Ambulatory Visit: Admit: 2018-08-19 | Payer: Medicare Other | Admitting: Urology

## 2018-08-19 ENCOUNTER — Inpatient Hospital Stay (HOSPITAL_COMMUNITY): Payer: Medicare Other | Admitting: Anesthesiology

## 2018-08-19 ENCOUNTER — Encounter (HOSPITAL_COMMUNITY)
Admission: EM | Disposition: A | Discharge status: Home / Self Care | Payer: Self-pay | Source: Other Acute Inpatient Hospital | Attending: Internal Medicine

## 2018-08-19 ENCOUNTER — Other Ambulatory Visit: Payer: Self-pay | Admitting: Urology

## 2018-08-19 DIAGNOSIS — A419 Sepsis, unspecified organism: Secondary | ICD-10-CM | POA: Diagnosis present

## 2018-08-19 DIAGNOSIS — N390 Urinary tract infection, site not specified: Secondary | ICD-10-CM

## 2018-08-19 HISTORY — PX: TRANSURETHRAL RESECTION OF PROSTATE: SHX73

## 2018-08-19 LAB — CBC WITH DIFFERENTIAL/PLATELET
Abs Immature Granulocytes: 1.09 10*3/uL — ABNORMAL HIGH (ref 0.00–0.07)
Basophils Absolute: 0.1 10*3/uL (ref 0.0–0.1)
Basophils Relative: 0 %
EOS PCT: 0 %
Eosinophils Absolute: 0 10*3/uL (ref 0.0–0.5)
HCT: 44.1 % (ref 39.0–52.0)
Hemoglobin: 13.5 g/dL (ref 13.0–17.0)
Immature Granulocytes: 4 %
Lymphocytes Relative: 6 %
Lymphs Abs: 1.5 10*3/uL (ref 0.7–4.0)
MCH: 29.2 pg (ref 26.0–34.0)
MCHC: 30.6 g/dL (ref 30.0–36.0)
MCV: 95.5 fL (ref 80.0–100.0)
Monocytes Absolute: 1.8 10*3/uL — ABNORMAL HIGH (ref 0.1–1.0)
Monocytes Relative: 7 %
Neutro Abs: 20.2 10*3/uL — ABNORMAL HIGH (ref 1.7–7.7)
Neutrophils Relative %: 83 %
Platelets: 167 10*3/uL (ref 150–400)
RBC: 4.62 MIL/uL (ref 4.22–5.81)
RDW: 14.3 % (ref 11.5–15.5)
WBC: 24.7 10*3/uL — ABNORMAL HIGH (ref 4.0–10.5)
nRBC: 0 % (ref 0.0–0.2)

## 2018-08-19 LAB — BASIC METABOLIC PANEL
ANION GAP: 8 (ref 5–15)
BUN: 19 mg/dL (ref 8–23)
CO2: 24 mmol/L (ref 22–32)
Calcium: 8.6 mg/dL — ABNORMAL LOW (ref 8.9–10.3)
Chloride: 106 mmol/L (ref 98–111)
Creatinine, Ser: 1.19 mg/dL (ref 0.61–1.24)
GFR calc Af Amer: >60 mL/min (ref >60)
GFR calc non Af Amer: 58 mL/min — ABNORMAL LOW (ref >60)
Glucose, Bld: 121 mg/dL — ABNORMAL HIGH (ref 70–99)
Potassium: 4 mmol/L (ref 3.5–5.1)
Sodium: 138 mmol/L (ref 135–145)

## 2018-08-19 LAB — PROTIME-INR
INR: 1.8 — ABNORMAL HIGH (ref 0.8–1.2)
Prothrombin Time: 20.5 seconds — ABNORMAL HIGH (ref 11.4–15.2)

## 2018-08-19 LAB — SURGICAL PCR SCREEN
MRSA, PCR: NEGATIVE
Staphylococcus aureus: NEGATIVE

## 2018-08-19 SURGERY — TURP (TRANSURETHRAL RESECTION OF PROSTATE)
Anesthesia: General

## 2018-08-19 MED ORDER — LIDOCAINE 2% (20 MG/ML) 5 ML SYRINGE
INTRAMUSCULAR | Status: AC
  Filled 2018-08-19: qty 5

## 2018-08-19 MED ORDER — LIDOCAINE 2% (20 MG/ML) 5 ML SYRINGE
INTRAMUSCULAR | Status: DC | PRN
  Administered 2018-08-19: 100 mg via INTRAVENOUS

## 2018-08-19 MED ORDER — HYDROMORPHONE HCL 1 MG/ML IJ SOLN
0.5000 mg | INTRAMUSCULAR | Status: DC | PRN
  Administered 2018-08-19: 0.5 mg via INTRAVENOUS
  Filled 2018-08-19: qty 0.5

## 2018-08-19 MED ORDER — ACETAMINOPHEN 10 MG/ML IV SOLN: 1000.0000 mg | Freq: Once | INTRAVENOUS | Status: DC | PRN

## 2018-08-19 MED ORDER — TRAMADOL HCL 50 MG PO TABS: 50.0000 mg | ORAL_TABLET | Freq: Four times a day (QID) | ORAL | Status: DC | PRN

## 2018-08-19 MED ORDER — PROPOFOL 10 MG/ML IV BOLUS
INTRAVENOUS | Status: DC | PRN
  Administered 2018-08-19: 50 mg via INTRAVENOUS
  Administered 2018-08-19: 100 mg via INTRAVENOUS
  Administered 2018-08-19: 40 mg via INTRAVENOUS

## 2018-08-19 MED ORDER — STERILE WATER FOR IRRIGATION IR SOLN
Status: DC | PRN
  Administered 2018-08-19: 1000 mL

## 2018-08-19 MED ORDER — PHENYLEPHRINE 40 MCG/ML (10ML) SYRINGE FOR IV PUSH (FOR BLOOD PRESSURE SUPPORT)
PREFILLED_SYRINGE | INTRAVENOUS | Status: AC
  Filled 2018-08-19: qty 10

## 2018-08-19 MED ORDER — FENTANYL CITRATE (PF) 100 MCG/2ML IJ SOLN
INTRAMUSCULAR | Status: AC
  Filled 2018-08-19: qty 2

## 2018-08-19 MED ORDER — ONDANSETRON HCL 4 MG/2ML IJ SOLN
INTRAMUSCULAR | Status: AC
  Filled 2018-08-19: qty 2

## 2018-08-19 MED ORDER — ONDANSETRON HCL 4 MG/2ML IJ SOLN
INTRAMUSCULAR | Status: DC | PRN
  Administered 2018-08-19: 4 mg via INTRAVENOUS

## 2018-08-19 MED ORDER — FENTANYL CITRATE (PF) 100 MCG/2ML IJ SOLN: 25.0000 ug | INTRAMUSCULAR | Status: DC | PRN

## 2018-08-19 MED ORDER — SODIUM CHLORIDE 0.9 % IR SOLN
Status: DC | PRN
  Administered 2018-08-19: 15000 mL

## 2018-08-19 MED ORDER — PHENYLEPHRINE 40 MCG/ML (10ML) SYRINGE FOR IV PUSH (FOR BLOOD PRESSURE SUPPORT)
PREFILLED_SYRINGE | INTRAVENOUS | Status: DC | PRN
  Administered 2018-08-19: 120 ug via INTRAVENOUS
  Administered 2018-08-19 (×3): 80 ug via INTRAVENOUS

## 2018-08-19 MED ORDER — ONDANSETRON HCL 4 MG/2ML IJ SOLN: 4.0000 mg | Freq: Once | INTRAMUSCULAR | Status: DC | PRN

## 2018-08-19 MED ORDER — 0.9 % SODIUM CHLORIDE (POUR BTL) OPTIME
TOPICAL | Status: DC | PRN
  Administered 2018-08-19: 1000 mL

## 2018-08-19 MED ORDER — DEXAMETHASONE SODIUM PHOSPHATE 10 MG/ML IJ SOLN
INTRAMUSCULAR | Status: DC | PRN
  Administered 2018-08-19: 10 mg via INTRAVENOUS

## 2018-08-19 MED ORDER — FENTANYL CITRATE (PF) 100 MCG/2ML IJ SOLN
INTRAMUSCULAR | Status: DC | PRN
  Administered 2018-08-19 (×4): 25 ug via INTRAVENOUS

## 2018-08-19 MED ORDER — LACTATED RINGERS IV SOLN
INTRAVENOUS | Status: DC
  Administered 2018-08-19: 15:00:00 via INTRAVENOUS

## 2018-08-19 MED ORDER — MUPIROCIN 2 % EX OINT
1.0000 "application " | TOPICAL_OINTMENT | Freq: Two times a day (BID) | CUTANEOUS | Status: DC
  Administered 2018-08-19 – 2018-08-21 (×4): 1 via NASAL
  Filled 2018-08-19: qty 22

## 2018-08-19 MED ORDER — PROPOFOL 10 MG/ML IV BOLUS
INTRAVENOUS | Status: AC
  Filled 2018-08-19: qty 20

## 2018-08-19 SURGICAL SUPPLY — 19 items
BAG URINE DRAINAGE (UROLOGICAL SUPPLIES) ×3 IMPLANT
BAG URO CATCHER STRL LF (MISCELLANEOUS) ×3 IMPLANT
CATH HEMA 3WAY 30CC 22FR COUDE (CATHETERS) IMPLANT
CATH HEMA 3WAY 30CC 24FR COUDE (CATHETERS) ×2 IMPLANT
COVER WAND RF STERILE (DRAPES) IMPLANT
ELECT REM PT RETURN 15FT ADLT (MISCELLANEOUS) ×3 IMPLANT
GLOVE BIOGEL M STRL SZ7.5 (GLOVE) ×3 IMPLANT
GOWN STRL REUS W/TWL LRG LVL3 (GOWN DISPOSABLE) ×3 IMPLANT
GOWN STRL REUS W/TWL XL LVL3 (GOWN DISPOSABLE) ×3 IMPLANT
GUIDEWIRE STR DUAL SENSOR (WIRE) ×2 IMPLANT
HOLDER FOLEY CATH W/STRAP (MISCELLANEOUS) ×4 IMPLANT
LOOP CUT BIPOLAR 24F LRG (ELECTROSURGICAL) IMPLANT
MANIFOLD NEPTUNE II (INSTRUMENTS) ×3 IMPLANT
PACK CYSTO (CUSTOM PROCEDURE TRAY) ×3 IMPLANT
SYRINGE 35CC LL (MISCELLANEOUS) ×2 IMPLANT
SYRINGE IRR TOOMEY STRL 70CC (SYRINGE) ×3 IMPLANT
TUBING CONNECTING 10 (TUBING) ×2 IMPLANT
TUBING CONNECTING 10' (TUBING) ×1
TUBING UROLOGY SET (TUBING) ×3 IMPLANT

## 2018-08-19 NOTE — Anesthesia Postprocedure Evaluation (Signed)
Anesthesia Post Note  Patient: [name]  Procedure(s) Performed: TRANSURETHRAL RESECTION OF THE PROSTATE (TURP) OF PROSTATE ABCESS (N/A )     Patient location during evaluation: PACU Anesthesia Type: General Level of consciousness: awake and alert Pain management: pain level controlled Vital Signs Assessment: post-procedure vital signs reviewed and stable Respiratory status: spontaneous breathing, nonlabored ventilation, respiratory function stable and patient connected to nasal cannula oxygen Cardiovascular status: blood pressure returned to baseline and stable Postop Assessment: no apparent nausea or vomiting Anesthetic complications: no    Last Vitals:  Vitals:   08/19/18 1700 08/19/18 1715  BP: 127/67 137/87  Pulse: (!) 103 95  Resp: 20 (!) 24  Temp:  37.1 C  SpO2: 92% 99%    Last Pain:  Vitals:   08/19/18 1715  TempSrc:   PainSc: 0-No pain                 Barnet Glasgow

## 2018-08-19 NOTE — Anesthesia Procedure Notes (Signed)
Procedure Name: LMA Insertion Date/Time: 08/19/2018 3:38 PM Performed by: Barnet Glasgow, MD Patient Re-evaluated:Patient Re-evaluated prior to induction Oxygen Delivery Method: Circle system utilized Preoxygenation: Pre-oxygenation with 100% oxygen Induction Type: IV induction LMA: LMA inserted LMA Size: 5.0 Placement Confirmation: positive ETCO2 and breath sounds checked- equal and bilateral Tube secured with: Tape

## 2018-08-19 NOTE — Anesthesia Preprocedure Evaluation (Signed)
Anesthesia Evaluation  Patient identified by MRN, date of birth, ID band Patient awake    Reviewed: Allergy & Precautions, NPO status , Patient's Chart, lab work & pertinent test results  Airway Mallampati: II  TM Distance: >3 FB Neck ROM: Full    Dental no notable dental hx. (+) Edentulous Upper, Edentulous Lower   Pulmonary COPD,  COPD inhaler, former smoker,    Pulmonary exam normal breath sounds clear to auscultation       Cardiovascular hypertension, Pt. on home beta blockers and Pt. on medications + CAD  Normal cardiovascular exam+ dysrhythmias Atrial Fibrillation  Rhythm:Regular Rate:Normal  07/10/2018  Echo Left ventricle: The cavity size was normal. Wall thickness was   normal. Systolic function was normal. The estimated ejection   fraction was in the range of 60% to 65%. Wall motion was normal;   there were no regional wall motion abnormalities. The study was   not technically sufficient to allow evaluation of LV diastolic   dysfunction due to atrial fibrillation. - Aortic valve: Moderately calcified annulus. Trileaflet; mildly   thickened leaflets. Sclerosis without stenosis. There was   moderate regurgitation. - Mitral valve: Mildly calcified annulus. Normal thickness leaflets   . There was mild regurgitation.   Neuro/Psych PSYCHIATRIC DISORDERS Anxiety CVA, No Residual Symptoms    GI/Hepatic GERD  ,  Endo/Other    Renal/GU Cr 1.19     Musculoskeletal   Abdominal   Peds  Hematology Hgb 13.5 Plts 167  INR 1.8   Anesthesia Other Findings   Reproductive/Obstetrics                             Anesthesia Physical Anesthesia Plan  ASA: III  Anesthesia Plan: General   Post-op Pain Management:    Induction: Intravenous  PONV Risk Score and Plan: 2 and Treatment may vary due to age or medical condition, Ondansetron and Dexamethasone  Airway Management Planned:  LMA  Additional Equipment:   Intra-op Plan:   Post-operative Plan: Extubation in OR  Informed Consent: I have reviewed the patients History and Physical, chart, labs and discussed the procedure including the risks, benefits and alternatives for the proposed anesthesia with the patient or authorized representative who has indicated his/her understanding and acceptance.     Dental advisory given  Plan Discussed with: CRNA and Anesthesiologist  Anesthesia Plan Comments:         Anesthesia Quick Evaluation

## 2018-08-19 NOTE — Evaluation (Signed)
Physical Therapy Evaluation Patient Details Name: [name] MRN: [id number] DOB: January 31, 1941 Today's Date: 08/19/2018   History of Present Illness  [name] is a 78 y.o. male with medical history significant of chronic A. fib, carotid stenosis, hyperlipidemia, coronary disease, aortic regurgitation, COPD who was sent from for direct admission from [facility] for the evaluation of  prostatic abscess.  Scheduled for procedure later today.  Clinical Impression  Patient presents with decreased mobility due to pain and limited activity tolerance.  Currently min A to supervision for mobility with family available to assist at home.  Feel he will benefit from skilled PT in the acute setting to allow d/c home with assist and likely no follow up PT at d/c depending on progress following procedure later today.      Follow Up Recommendations Supervision for mobility/OOB;No PT follow up    Equipment Recommendations  None recommended by PT    Recommendations for Other Services       Precautions / Restrictions Precautions Precautions: Fall      Mobility  Bed Mobility Overal bed mobility: Needs Assistance Bed Mobility: Supine to Sit;Sit to Supine     Supine to sit: HOB elevated;Min assist Sit to supine: Min guard   General bed mobility comments: lifting assist for trunk at pt request due to pain, assist for one leg to supine  Transfers Overall transfer level: Needs assistance Equipment used: Rolling walker (2 wheeled) Transfers: Sit to/from Stand Sit to Stand: Min guard         General transfer comment: assist to steady  Ambulation/Gait Ambulation/Gait assistance: Supervision;Min guard Gait Distance (Feet): 120 Feet Assistive device: Rolling walker (2 wheeled) Gait Pattern/deviations: Step-to pattern;Step-through pattern;Decreased stride length     General Gait Details: assist to steady, slow and steady pace and turns with walker safely  Stairs             Wheelchair Mobility    Modified Rankin (Stroke Patients Only)       Balance Overall balance assessment: Needs assistance   Sitting balance-Leahy Scale: Good       Standing balance-Leahy Scale: Fair                               Pertinent Vitals/Pain Pain Assessment: Faces Faces Pain Scale: Hurts even more Pain Location: lower abdomen with transitions  Pain Descriptors / Indicators: Discomfort Pain Intervention(s): Monitored during session;Repositioned    Home Living Family/patient expects to be discharged to:: Private residence Living Arrangements: Spouse/significant other Available Help at Discharge: Family Type of Home: House Home Access: Stairs to enter   Technical brewer of Steps: 1-2 Home Layout: Multi-level Home Equipment: Environmental consultant - 2 wheels;Cane - single point;Shower seat;Bedside commode      Prior Function Level of Independence: Independent         Comments: was chopping wood prior to admission     Hand Dominance        Extremity/Trunk Assessment   Upper Extremity Assessment Upper Extremity Assessment: Overall WFL for tasks assessed    Lower Extremity Assessment Lower Extremity Assessment: Generalized weakness       Communication   Communication: No difficulties  Cognition Arousal/Alertness: Awake/alert Behavior During Therapy: WFL for tasks assessed/performed Overall Cognitive Status: Within Functional Limits for tasks assessed  General Comments General comments (skin integrity, edema, etc.): spouse and daughter in the room, report patient with waxing and waning energy PTA due likely to infection with first issue in October, but not diagnosed.     Exercises     Assessment/Plan    PT Assessment Patient needs continued PT services  PT Problem List Decreased strength;Decreased mobility;Decreased knowledge of use of DME;Pain       PT Treatment Interventions  DME instruction;Functional mobility training;Patient/family education;Gait training;Stair training;Therapeutic exercise;Therapeutic activities    PT Goals (Current goals can be found in the Care Plan section)  Acute Rehab PT Goals Patient Stated Goal: to return to activity PT Goal Formulation: With patient/family Time For Goal Achievement: 08/26/18 Potential to Achieve Goals: Good    Frequency Min 3X/week   Barriers to discharge        Co-evaluation               AM-PAC PT "6 Clicks" Mobility  Outcome Measure Help needed turning from your back to your side while in a flat bed without using bedrails?: None Help needed moving from lying on your back to sitting on the side of a flat bed without using bedrails?: A Little Help needed moving to and from a bed to a chair (including a wheelchair)?: A Little Help needed standing up from a chair using your arms (e.g., wheelchair or bedside chair)?: A Little Help needed to walk in hospital room?: A Little Help needed climbing 3-5 steps with a railing? : A Little 6 Click Score: 19    End of Session Equipment Utilized During Treatment: Gait belt Activity Tolerance: Patient tolerated treatment well Patient left: in bed;with call bell/phone within reach;with family/visitor present   PT Visit Diagnosis: Other abnormalities of gait and mobility (R26.89);Muscle weakness (generalized) (M62.81)    Time: 0272-5366 PT Time Calculation (min) (ACUTE ONLY): 20 min   Charges:   PT Evaluation $PT Eval Low Complexity: [place], [place] Acute Rehabilitation Services [phone] 08/19/2018   Reginia Naas 08/19/2018, 1:01 PM

## 2018-08-19 NOTE — Progress Notes (Signed)
Subjective: [name] has a left prostatic abscess.  He is improving on current therapy with Meropenem.  His INR is down to 1.8.  He has no further fever and chills but remains very weak.  ROS:  Review of Systems  Constitutional: Positive for malaise/fatigue. Negative for fever.  Respiratory: Positive for shortness of breath.   Cardiovascular: Negative for chest pain.    Anti-infectives: Anti-infectives (From admission, onward)   Start     Dose/Rate Route Frequency Ordered Stop   08/18/18 2200  meropenem (MERREM) 1 g in sodium chloride 0.9 % 100 mL IVPB     1 g 200 mL/hr over 30 Minutes Intravenous Every 8 hours 08/18/18 2148     08/18/18 1800  cefTRIAXone (ROCEPHIN) 1 g in sodium chloride 0.9 % 100 mL IVPB  Status:  Discontinued     1 g 200 mL/hr over 30 Minutes Intravenous Every 24 hours 08/18/18 1756 08/18/18 2128      Current Facility-Administered Medications  Medication Dose Route Frequency Provider Last Rate Last Dose  . 0.9 %  sodium chloride infusion   Intravenous Continuous Shelly Coss, MD 75 mL/hr at 08/19/18 0549    . albuterol (PROVENTIL) (2.5 MG/3ML) 0.083% nebulizer solution 2.5 mg  2.5 mg Inhalation Q4H PRN Shelly Coss, MD      . ezetimibe (ZETIA) tablet 10 mg  10 mg Oral Daily Adhikari, Amrit, MD      . meropenem (MERREM) 1 g in sodium chloride 0.9 % 100 mL IVPB  1 g Intravenous Q8H Pham, Anh P, RPH 200 mL/hr at 08/19/18 0550 1 g at 08/19/18 0550  . metoprolol tartrate (LOPRESSOR) tablet 37.5 mg  37.5 mg Oral BID Shelly Coss, MD   37.5 mg at 08/18/18 2204  . mometasone-formoterol (DULERA) 200-5 MCG/ACT inhaler 2 puff  2 puff Inhalation BID Shelly Coss, MD      . mupirocin ointment (BACTROBAN) 2 % 1 application  1 application Nasal BID Shelly Coss, MD      . tamsulosin (FLOMAX) capsule 0.4 mg  0.4 mg Oral Daily Marton Redwood III, MD      . traZODone (DESYREL) tablet 100 mg  100 mg Oral QHS PRN Shelly Coss, MD         Objective: Vital  signs in last 24 hours: Temp:  [98.3 F (36.8 C)-99.7 F (37.6 C)] 98.3 F (36.8 C) (03/03 0637) Pulse Rate:  [85-87] 87 (03/03 0637) Resp:  [14-20] 18 (03/03 0637) BP: (119-142)/(60-73) 142/73 (03/03 0637) SpO2:  [94 %-96 %] 94 % (03/03 0637) Weight:  [75.8 kg] 75.8 kg (03/02 1742)  Intake/Output from previous day: 03/02 0701 - 03/03 0700 In: 770.5 [P.O.:360; I.V.:284.4; IV Piggyback:126.2] Out: -  Intake/Output this shift: No intake/output data recorded.   Physical Exam Vitals signs reviewed.  Constitutional:      Appearance: Normal appearance.  Cardiovascular:     Rate and Rhythm: Regular rhythm.  Pulmonary:     Effort: Pulmonary effort is normal. No respiratory distress.  Neurological:     Mental Status: He is alert.     Lab Results:  Recent Labs    08/18/18 1818 08/19/18 0603  WBC 27.4* PENDING  HGB 13.9 13.5  HCT 44.2 44.1  PLT 174 167   BMET Recent Labs    08/18/18 1818 08/19/18 0603  NA 136 PENDING  K 3.1* PENDING  CL 100 PENDING  CO2 27 PENDING  GLUCOSE 117* 121*  BUN 19 19  CREATININE 1.13 1.19  CALCIUM 8.6* PENDING  PT/INR Recent Labs    08/18/18 1834 08/19/18 0603  LABPROT 22.0* 20.5*  INR 2.0* 1.8*   ABG No results for input(s): PHART, HCO3 in the last 72 hours.  Invalid input(s): PCO2, PO2  Studies/Results: No results found.   Assessment and Plan: Prostatic abscess with sepsis.  He is improving with IV antibiotics but the abscess is fairly large and would benefit from unroofing.  INR is down to 1.8 so he should be safe to go to the OR today.   I will work on getting him posted.  It will be later this PM.   I have reviewed the risks of bleeding, worsening infection, voiding difficulty, incontinence, thrombotic events and anesthetic complications.        LOS: 1 day    Irine Seal 08/19/2018 [phone] ID: [name], male   DOB: 03-Aug-1940, 78 y.o.   MRN: [id number]

## 2018-08-19 NOTE — Care Management Note (Signed)
Case Management Note  Patient Details  Name: [name] MRN: [id number] Date of Birth: May 30, 1941  Subjective/Objective:                    Action/Plan:Pt Declined HH at present time.    Expected Discharge Date:  08/20/18               Expected Discharge Plan:  [place]  In-House Referral:     Discharge planning Services  CM Consult  Post Acute Care Choice:    Choice offered to:     DME Arranged:    DME Agency:     HH Arranged:  (refused) HH Agency:     Status of Service:  Completed, signed off  If discussed at [place] of Stay Meetings, dates discussed:    Additional CommentsPurcell Mouton, RN 08/19/2018, 12:47 PM

## 2018-08-19 NOTE — Brief Op Note (Signed)
08/18/2018 - 08/19/2018  4:42 PM  PATIENT:  [name]  78 y.o. male  PRE-OPERATIVE DIAGNOSIS:  prostate abcess  POST-OPERATIVE DIAGNOSIS:  prostate abcess  PROCEDURE:  Procedure(s): TRANSURETHRAL RESECTION OF THE PROSTATE (TURP) OF PROSTATE ABCESS (N/A)  SURGEON:  Surgeon(s) and Role:    Alexis Frock, MD - Primary  PHYSICIAN ASSISTANT:   ASSISTANTS: none   ANESTHESIA:   general  EBL:  0 mL   BLOOD ADMINISTERED:none  DRAINS: 67F 3 way hematuria foely to NS irrigation.    LOCAL MEDICATIONS USED:  NONE  SPECIMEN:  Source of Specimen:  1 - prostate chips for pathology; 2 - prostate chips for tissue culture  DISPOSITION OF SPECIMEN:  PATHOLOGY  COUNTS:  YES  TOURNIQUET:  * No tourniquets in log *  DICTATION: .Other Dictation: Dictation Number [id number]  PLAN OF CARE: Admit to inpatient   PATIENT DISPOSITION:  PACU - hemodynamically stable.   Delay start of Pharmacological VTE agent (>24hrs) due to surgical blood loss or risk of bleeding: yes

## 2018-08-19 NOTE — Transfer of Care (Signed)
Immediate Anesthesia Transfer of Care Note  Patient: [name]  Procedure(s) Performed: TRANSURETHRAL RESECTION OF THE PROSTATE (TURP) OF PROSTATE ABCESS (N/A )  Patient Location: PACU  Anesthesia Type:General  Level of Consciousness: awake, alert , oriented and patient cooperative  Airway & Oxygen Therapy: Patient Spontanous Breathing and Patient connected to face mask  Post-op Assessment: Report given to RN and Post -op Vital signs reviewed and stable  Post vital signs: Reviewed and stable  Last Vitals:  Vitals Value Taken Time  BP 119/43 08/19/2018  4:51 PM  Temp    Pulse 101 08/19/2018  4:53 PM  Resp 24 08/19/2018  4:53 PM  SpO2 99 % 08/19/2018  4:53 PM  Vitals shown include unvalidated device data.  Last Pain:  Vitals:   08/19/18 1559  TempSrc:   PainSc: 0-No pain         Complications: No apparent anesthesia complications

## 2018-08-19 NOTE — Progress Notes (Signed)
[facility] for warfarin Indication: hx atrial fibrillation and stroke  Allergies  Allergen Reactions  . Asa [Aspirin]     GI upset  . Niacin     REACTION: hot flashes    Patient Measurements: Height: 5\' 5"  (165.1 cm) Weight: 167 lb 1.7 oz (75.8 kg) IBW/kg (Calculated) : 61.5 Heparin Dosing Weight:   Vital Signs: Temp: 98.3 F (36.8 C) (03/03 0637) Temp Source: Oral (03/03 0637) BP: 142/73 (03/03 1173) Pulse Rate: 87 (03/03 0637)  Labs: Recent Labs    08/18/18 1818 08/18/18 1834 08/19/18 0603  HGB 13.9  --  13.5  HCT 44.2  --  44.1  PLT 174  --  167  LABPROT  --  22.0* 20.5*  INR  --  2.0* 1.8*  CREATININE 1.13  --  1.19    Estimated Creatinine Clearance: 47.8 mL/min (by C-G formula based on SCr of 1.19 mg/dL).   Medications:  PTA warfarin regimen: 5 mg daily except 2.5 mg on Tue and Fri (last dose taken on 3/2 at 0930)  Assessment: Patient's a 78 y.o M with hx afib and CVA on warfarin PTA, transferred from [facility] to [facility] on 3/2 for workup and management of prostatic abscess.  Of note, patient recently had a cystoscopy for workup of hematuria.  Per pt, hematuria has resolved.    Today, 08/19/2018: - INR 1.8 CBC stable, no bleeding reported  Goal of Therapy:  INR 2-3 Monitor platelets by anticoagulation protocol: Yes   Plan:  Coumadin on hold for TURP abscess today INR daily F/u when to resume  Eudelia Bunch, Pharm.D [phone] 08/19/2018 1:30 PM

## 2018-08-19 NOTE — Progress Notes (Signed)
PROGRESS NOTE    [name]  VCB:[id number] DOB: December 05, 1940 DOA: 08/18/2018 PCP: Loman Brooklyn, FNP   Brief Narrative:  [name] is a 78 y.o. male with medical history significant of chronic A. fib, carotid stenosis, hyperlipidemia, coronary disease, aortic regurgitation, COPD who was sent from for direct admission from [facility] for the evaluation of  prostatic abscess.  He has been started on IV antibiotic.  Urology following and planning for unroofing of the prostatic abscess.  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  Started on meropenem.  Will get blood cultures, urine cultures. Imaging at outside hospital showed prostatic abscess, could not retrieve the imagings here Has leucocytosis. Continue gentle  IV fluids. Urology planning for unroofing of the abscess today.  Chronic A. fib: Heart rate in the range of 100-110.CHADS2Vasc score is 5.  Restarted his home dose metoprolol.  On warfarin for anticoagulation.  Continue to monitor daily INR.  We will monitor him on telemetry. Follows with cardiology, Dr. Harl Bowie at [facility]  History of coronary artery disease: Mild disease as per prior catheterization.  Continue medical management.  No recent symptoms.  Hyperlipidemia: Intolerant to multiple statins.  On Zetia which we will continue.  History of chronic diastolic CHF: History of diastolic CHF as per his cardiologist's note .Currently euvolemic.  On Lasix as needed at home.Continue IV fluids for now.  COPD: Currently stable.  Continue PRN bronchodilators.  On Symbicort at home .  Follows with pulmonology as an outpatient.  Past smoker.  Deconditioning/debility: Complaining of recent difficulty in ambulation.  Complains of generalized body ache and  weakness.  Will request PT evaluation         DVT prophylaxis:Warfarin Code Status: Full Family Communication: Family members present at the bedside Disposition Plan: Home after completion of management  Consultants: Urology  Procedures: None Antimicrobials:  Anti-infectives (From admission, onward)   Start     Dose/Rate Route Frequency Ordered Stop   08/18/18 2200  meropenem (MERREM) 1 g in sodium chloride 0.9 % 100 mL IVPB     1 g 200 mL/hr over 30 Minutes Intravenous Every 8 hours 08/18/18 2148     08/18/18 1800  cefTRIAXone (ROCEPHIN) 1 g in sodium chloride 0.9 % 100 mL IVPB  Status:  Discontinued     1 g 200 mL/hr over 30 Minutes Intravenous Every 24 hours 08/18/18 1756 08/18/18 2128      Subjective: Patient seen and examined the bedside this morning.  Remains comfortable.  Hemodynamically stable.  Denies any dysuria.  Afebrile.  Objective: Vitals:   08/18/18 1742 08/18/18 2114 08/19/18 0637  BP: 133/68 119/60 (!) 142/73  Pulse: 85 87 87  Resp: 20 14 18   Temp: 98.4 F (36.9 C) 99.7 F (37.6 C) 98.3 F (36.8 C)  TempSrc: Oral Oral Oral  SpO2: 95% 96% 94%  Weight: 75.8 kg    Height: 5\' 5"  (1.651 m)      Intake/Output Summary (Last 24 hours) at 08/19/2018 1153 Last data filed at 08/18/2018 2300 Gross per 24 hour  Intake 770.53 ml  Output -  Net 770.53 ml   Filed Weights   08/18/18 1742  Weight: 75.8 kg    Examination:  General exam: Appears calm and comfortable ,Not in distress, pleasant elderly gentleman  HEENT:PERRL,Oral  mucosa moist, Ear/Nose normal on gross exam Respiratory system: Bilateral equal air entry, normal vesicular breath sounds, no wheezes or crackles  Cardiovascular system: S1 & S2 heard, RRR. No JVD, murmurs, rubs, gallops or clicks. No pedal edema. Gastrointestinal system: Abdomen is nondistended, soft and nontender. No organomegaly or masses felt. Normal bowel sounds heard. Central nervous system: Alert and oriented. No focal  neurological deficits. Extremities: No edema, no clubbing ,no cyanosis, distal peripheral pulses palpable. Skin: No rashes, lesions or ulcers,no icterus ,no pallor MSK: Normal muscle bulk,tone ,power Psychiatry: Judgement and insight appear normal. Mood & affect appropriate.     Data Reviewed: I have personally reviewed following labs and imaging studies  CBC: Recent Labs  Lab 08/18/18 1818 08/19/18 0603  WBC 27.4* 24.7*  NEUTROABS 22.7* 20.2*  HGB 13.9 13.5  HCT 44.2 44.1  MCV 93.1 95.5  PLT 174 182   Basic Metabolic Panel: Recent Labs  Lab 08/18/18 1818 08/19/18 0603  NA 136 138  K 3.1* 4.0  CL 100 106  CO2 27 24  GLUCOSE 117* 121*  BUN 19 19  CREATININE 1.13 1.19  CALCIUM 8.6* 8.6*   GFR: Estimated Creatinine Clearance: 47.8 mL/min (by C-G formula based on SCr of 1.19 mg/dL). Liver Function Tests: No results for input(s): AST, ALT, ALKPHOS, BILITOT, PROT, ALBUMIN in the last 168 hours. No results for input(s): LIPASE, AMYLASE in the last 168 hours. No results for input(s): AMMONIA in the last 168 hours. Coagulation Profile: Recent Labs  Lab 08/18/18 1834 08/19/18 0603  INR 2.0* 1.8*   Cardiac Enzymes: No results for input(s): CKTOTAL, CKMB, CKMBINDEX, TROPONINI in the last 168 hours. BNP (last 3 results) No results for input(s): PROBNP in the last 8760 hours. HbA1C: No results for input(s): HGBA1C in the last 72 hours. CBG: No results for input(s): GLUCAP in the last 168 hours. Lipid Profile: No results for input(s): CHOL, HDL, LDLCALC, TRIG, CHOLHDL, LDLDIRECT in the last 72 hours. Thyroid Function Tests: No results for input(s): TSH, T4TOTAL, FREET4, T3FREE, THYROIDAB in the last 72 hours. Anemia Panel: No results for input(s): VITAMINB12, FOLATE, FERRITIN, TIBC, IRON, RETICCTPCT in the last 72 hours. Sepsis Labs: Recent Labs  Lab 08/18/18 1819  LATICACIDVEN 1.8    No results found for this or any previous visit (from the past 240 hour(s)).         Radiology Studies: No results found.      Scheduled Meds: . ezetimibe  10 mg Oral Daily  . metoprolol tartrate  37.5 mg Oral BID  . mometasone-formoterol  2 puff Inhalation BID  . mupirocin ointment  1 application Nasal BID  . tamsulosin  0.4 mg Oral Daily   Continuous Infusions: . sodium chloride 75 mL/hr at 08/19/18 0549  . meropenem (MERREM) IV 1 g (08/19/18 0550)     LOS: 1 day    Time spent: 25 mins.More than 50% of that time was spent in counseling and/or coordination of care.      Shelly Coss, MD [facility] Pager [phone]  If 7PM-7AM, please contact night-coverage [email or web address] Password [facility] 08/19/2018, 11:53 AM

## 2018-08-20 ENCOUNTER — Encounter (HOSPITAL_COMMUNITY): Payer: Self-pay | Admitting: Urology

## 2018-08-20 LAB — CBC WITH DIFFERENTIAL/PLATELET
Abs Immature Granulocytes: 0.11 10*3/uL — ABNORMAL HIGH (ref 0.00–0.07)
Basophils Absolute: 0 10*3/uL (ref 0.0–0.1)
Basophils Relative: 0 %
Eosinophils Absolute: 0 10*3/uL (ref 0.0–0.5)
Eosinophils Relative: 0 %
HCT: 45.8 % (ref 39.0–52.0)
Hemoglobin: 13.5 g/dL (ref 13.0–17.0)
Immature Granulocytes: 1 %
Lymphocytes Relative: 3 %
Lymphs Abs: 0.5 10*3/uL — ABNORMAL LOW (ref 0.7–4.0)
MCH: 28.4 pg (ref 26.0–34.0)
MCHC: 29.5 g/dL — ABNORMAL LOW (ref 30.0–36.0)
MCV: 96.2 fL (ref 80.0–100.0)
Monocytes Absolute: 0.7 10*3/uL (ref 0.1–1.0)
Monocytes Relative: 5 %
NEUTROS ABS: 13.3 10*3/uL — AB (ref 1.7–7.7)
Neutrophils Relative %: 91 %
Platelets: 153 10*3/uL (ref 150–400)
RBC: 4.76 MIL/uL (ref 4.22–5.81)
RDW: 13.8 % (ref 11.5–15.5)
WBC: 14.6 10*3/uL — ABNORMAL HIGH (ref 4.0–10.5)
nRBC: 0 % (ref 0.0–0.2)

## 2018-08-20 LAB — PROTIME-INR
INR: 1.8 — ABNORMAL HIGH (ref 0.8–1.2)
Prothrombin Time: 20.2 seconds — ABNORMAL HIGH (ref 11.4–15.2)

## 2018-08-20 LAB — BASIC METABOLIC PANEL
Anion gap: 6 (ref 5–15)
BUN: 23 mg/dL (ref 8–23)
CO2: 26 mmol/L (ref 22–32)
Calcium: 8.7 mg/dL — ABNORMAL LOW (ref 8.9–10.3)
Chloride: 106 mmol/L (ref 98–111)
Creatinine, Ser: 0.89 mg/dL (ref 0.61–1.24)
GFR calc non Af Amer: >60 mL/min (ref >60)
Glucose, Bld: 162 mg/dL — ABNORMAL HIGH (ref 70–99)
Potassium: 4.4 mmol/L (ref 3.5–5.1)
Sodium: 138 mmol/L (ref 135–145)

## 2018-08-20 LAB — URINE CULTURE: Culture: NO GROWTH

## 2018-08-20 NOTE — Op Note (Signed)
NAME: [name], [name] MEDICAL RECORD LK:[id number] ACCOUNT [id number] DATE OF BIRTH:08-25-1940 FACILITY: [facility] LOCATION: [facility] PHYSICIAN:Michi Herrmann Tresa Moore, MD  OPERATIVE REPORT  DATE OF PROCEDURE:  08/19/2018  PREOPERATIVE DIAGNOSIS:  Prostate abscess.  PROCEDURE:  Transurethral Resectoin of the prostate.  ESTIMATED BLOOD LOSS:  50 mL.  COMPLICATIONS:  None.  SPECIMENS: 1.  Prostate chips for permanent pathology. 2.  Prostate chips for tissue culture.  FINDINGS: 1.  Significant bilobar prostatic hypertrophy. 2.  Thick yogurt consistency pus, multifocal from prostate, mostly posteriorly and some on the left side. 3.  Open urinary channel from the bladder neck to verumontanum, status post resection. 4.  Moderate bladder trabeculation.  INDICATION:  The patient is a very pleasant 78 year old gentleman who was found on workup of recurrent urinary tract infections with sepsis to have an approximately 4 cm prostate abscess, left greater than right.  He has now been on IV antibiotics.  He  is improving, but given the size of his prostate abscess which is greater than 2 cm and history of recurrent infections, it was clearly felt that transurethral resection was warranted for maximal drainage and to prevent recurrence.  Informed consent was  obtained and placed in the medical record.  PROCEDURE IN DETAIL:  The patient being identified, procedure being transection of the prostate was confirmed.  Procedure time-out was performed.  Antibiotics were administered.  General anesthesia was induced.  The patient was placed into a low  lithotomy position, sterile field was created by prepping and draping his penis, perineum and proximal thighs using iodine.  Cystourethroscopy was performed using a 26-French resectoscope sheath with visual obturator.  Inspection of anterior and  posterior urethra revealed bilobar prostatic hypertrophy.  There was some shagginess in the area of verumontanum in the left  side of the prostate.  Inspection of the urinary bladder revealed mild to moderate trabeculation throughout.  Ureteral orifices  were singleton.  No papillary lesion was noted.  Next, using the resectoscope loop, transection of the prostate was performed in a top-down orientation.  First, the 12 o'clock position from the bladder neck to the area of the verumontanum, establishing a  urinary channel and then of the left lobe of the prostate down to what appeared to be the superficial prostatic capsule.  Throughout  the resection, several areas of thick yogurt consistency pus were drained.  There was no liquefactive single cavity but  more diffuse process noted.  Similarly, the right lobe of the prostate was resected and finally the posterior resection, taking great care to avoid undermining the bladder neck, which did not occur.  Additional hemostasis was achieved with point  coagulation cautery, and the prostate chips were irrigated with approximately 90% being set aside from pathology, 10% being set aside for tissue culture.  Additional point coagulation current was applied to the area of the bleeders.  The resectoscope was  exchanged for new 24-French 3-way Foley catheter over a sensor guidewire to the level of the urinary bladder.  Thirty mL of wire was placed in the balloon.  This was connected to normal saline irrigation with catheter strap traction.  Efflux was very  light pink, and the procedure was terminated.  The patient tolerated the procedure well.  No immediate perioperative complications.  The patient was taken to postanesthesia care in stable condition.  PLAN:  Hold anticoagulation for now as he has a remote history of CVA with AFib, but presently relatively asymptomatic.  He has a high risk of bleeding from the surgery.  LN/NUANCE  D:08/19/2018 T:08/19/2018 JOB:[id number]

## 2018-08-20 NOTE — Progress Notes (Signed)
Assumed care of patient, agree with previous RN assessment

## 2018-08-20 NOTE — Progress Notes (Signed)
Physical Therapy Treatment Patient Details Name: [name] MRN: [id number] DOB: July 17, 1940 Today's Date: 08/20/2018    History of Present Illness [name] is a 78 y.o. male with medical history significant of chronic A. fib, carotid stenosis, hyperlipidemia, coronary disease, aortic regurgitation, COPD who was sent from for direct admission from [facility] for the evaluation of  prostatic abscess.  S/p TURP 08/19/18.    PT Comments    Pt underwent TURP yesterday afternoon and had bladder irrigation going with ambulation.  Instruction for proper body mechanics and recommended to use RW at home til he got his strength back.  His wife and brother in law were present and seem supportive.   Follow Up Recommendations  Supervision for mobility/OOB;No PT follow up     Equipment Recommendations  None recommended by PT    Recommendations for Other Services       Precautions / Restrictions Precautions Precautions: Fall Restrictions Weight Bearing Restrictions: No    Mobility  Bed Mobility Overal bed mobility: Needs Assistance Bed Mobility: Rolling;Sidelying to Sit Rolling: Supervision Sidelying to sit: Min guard       General bed mobility comments: initially he tried to sit straight up with wife's A.  Pt educated on log roll and sidelying> sit and able to do without A.  Transfers Overall transfer level: Needs assistance Equipment used: Rolling walker (2 wheeled) Transfers: Sit to/from Stand Sit to Stand: Min guard         General transfer comment: cues for hand placement  Ambulation/Gait Ambulation/Gait assistance: Supervision;Min guard Gait Distance (Feet): 250 Feet Assistive device: Rolling walker (2 wheeled) Gait Pattern/deviations: Step-through pattern;Decreased stride length     General Gait Details: slow and steady Nature conservation officer    Modified Rankin (Stroke Patients Only)       Balance Overall balance  assessment: Needs assistance   Sitting balance-Leahy Scale: Good       Standing balance-Leahy Scale: Fair                              Cognition Arousal/Alertness: Awake/alert Behavior During Therapy: WFL for tasks assessed/performed Overall Cognitive Status: Within Functional Limits for tasks assessed                                        Exercises      General Comments        Pertinent Vitals/Pain Pain Assessment: Faces Faces Pain Scale: Hurts a little bit Pain Descriptors / Indicators: Discomfort Pain Intervention(s): Limited activity within patient's tolerance;Monitored during session;Repositioned    Home Living                      Prior Function            PT Goals (current goals can now be found in the care plan section) Acute Rehab PT Goals Patient Stated Goal: to return to activity PT Goal Formulation: With patient/family Time For Goal Achievement: 08/26/18 Potential to Achieve Goals: Good Progress towards PT goals: Progressing toward goals    Frequency    Min 3X/week      PT Plan Current plan remains appropriate    Co-evaluation  AM-PAC PT "6 Clicks" Mobility   Outcome Measure  Help needed turning from your back to your side while in a flat bed without using bedrails?: None Help needed moving from lying on your back to sitting on the side of a flat bed without using bedrails?: A Little Help needed moving to and from a bed to a chair (including a wheelchair)?: A Little Help needed standing up from a chair using your arms (e.g., wheelchair or bedside chair)?: A Little Help needed to walk in hospital room?: A Little Help needed climbing 3-5 steps with a railing? : A Little 6 Click Score: 19    End of Session Equipment Utilized During Treatment: Gait belt Activity Tolerance: Patient tolerated treatment well Patient left: in chair;with call bell/phone within reach;with family/visitor  present Nurse Communication: Mobility status;Other (comment)(emptied 700 from foley bag) PT Visit Diagnosis: Other abnormalities of gait and mobility (R26.89);Muscle weakness (generalized) (M62.81)     Time: 8875-7972 PT Time Calculation (min) (ACUTE ONLY): 26 min  Charges:  $Gait Training: 8-22 mins $Therapeutic Activity: 8-22 mins                     Cassy Sprowl L. Tamala Julian, [place] Pager [phone] 08/20/2018    Galen Manila 08/20/2018, 2:31 PM

## 2018-08-20 NOTE — Progress Notes (Signed)
PROGRESS NOTE    [name]  JKK:[id number] DOB: April 09, 1941 DOA: 08/18/2018 PCP: Loman Brooklyn, FNP   Brief Narrative:  [name] is a 78 y.o. male with medical history significant of chronic A. fib, carotid stenosis, hyperlipidemia, coronary disease, aortic regurgitation, COPD who was sent from for direct admission from [facility] for the evaluation of  prostatic abscess.  He has been started on IV antibiotic.  Urology following. he underwent transection of the prostate on 08/19/18.  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  Started on meropenem.  pending blood cultures, urine cultures. Imaging at outside hospital showed prostatic abscess, could not retrieve the imagings here Has leucocytosis but improving.  IV fluids stopped. Underwent transection of the prostate for prostatic abscess by urology on 08/19/2018. On continuous bladder irrigation.  Chronic A. fib: Heart rate is well controlled.CHADS2Vasc score is 5.  Restarted his home dose metoprolol. Was On warfarin for anticoagulation.  Continue to monitor daily INR.  We will monitor him on telemetry. Follows with cardiology, Dr. Harl Bowie at [facility]. Currently warfarin is on hold due to high risks of bleeding after surgery.  Will resume after urology clearance.  History of coronary artery disease: Mild disease as per prior catheterization.  Continue medical management.  No recent symptoms.  Hyperlipidemia: Intolerant to multiple statins.  On Zetia which we will continue.  History of chronic diastolic CHF: History of diastolic CHF as per his cardiologist's note .Currently euvolemic.  On Lasix as needed at home.  COPD: Currently stable.  Continue PRN  bronchodilators.  On Symbicort at home .  Follows with pulmonology as an outpatient.  Past smoker.  Deconditioning/debility: Complaining of recent difficulty in ambulation.  Complains of generalized body ache and weakness. PT didnot recommended follow-up.         DVT prophylaxis:SCD Code Status: Full Family Communication: None  present at the bedside Disposition Plan: Home after completion of management, urology clearance  Consultants: Urology   Procedures: Transection of prostate Antimicrobials:  Anti-infectives (From admission, onward)   Start     Dose/Rate Route Frequency Ordered Stop   08/18/18 2200  meropenem (MERREM) 1 g in sodium chloride 0.9 % 100 mL IVPB     1 g 200 mL/hr over 30 Minutes Intravenous Every 8 hours 08/18/18 2148     08/18/18 1800  cefTRIAXone (ROCEPHIN) 1 g in sodium chloride 0.9 % 100 mL IVPB  Status:  Discontinued     1 g 200 mL/hr over 30 Minutes Intravenous Every 24 hours 08/18/18 1756 08/18/18 2128      Subjective: Patient seen and examined the bedside this morning.  Remains comfortable.  Hemodynamically stable.  No complaints  Objective: Vitals:   08/19/18 1940 08/19/18 2010 08/19/18 2200 08/20/18 0425  BP:  125/66  (!) 142/84  Pulse: (!) 112 (!) 109  85  Resp: 19 16 16 18   Temp:  98.6 F (37 C)  97.8 F (36.6 C)  TempSrc:  Oral  Oral  SpO2: 97% 99% 98% 100%  Weight:      Height:        Intake/Output Summary (Last 24 hours) at 08/20/2018 1148 Last data filed at 08/20/2018 0715 Gross per 24 hour  Intake 2041.79 ml  Output  4150 ml  Net -2108.21 ml   Filed Weights   08/18/18 1742  Weight: 75.8 kg    Examination:  General exam: Appears calm and comfortable ,Not in distress,average built HEENT:PERRL,Oral mucosa moist, Ear/Nose normal on gross exam Respiratory system: Bilateral equal air entry, normal vesicular breath sounds, no wheezes or crackles  Cardiovascular system: Afib. No JVD, murmurs, rubs, gallops or  clicks. Gastrointestinal system: Abdomen is nondistended, soft and nontender. No organomegaly or masses felt. Normal bowel sounds heard. Central nervous system: Alert and oriented. No focal neurological deficits. Extremities: No edema, no clubbing ,no cyanosis, distal peripheral pulses palpable. Skin: No rashes, lesions or ulcers,no icterus ,no pallor GU: CBI     Data Reviewed: I have personally reviewed following labs and imaging studies  CBC: Recent Labs  Lab 08/18/18 1818 08/19/18 0603 08/20/18 0541  WBC 27.4* 24.7* 14.6*  NEUTROABS 22.7* 20.2* 13.3*  HGB 13.9 13.5 13.5  HCT 44.2 44.1 45.8  MCV 93.1 95.5 96.2  PLT 174 167 409   Basic Metabolic Panel: Recent Labs  Lab 08/18/18 1818 08/19/18 0603 08/20/18 0541  NA 136 138 138  K 3.1* 4.0 4.4  CL 100 106 106  CO2 27 24 26   GLUCOSE 117* 121* 162*  BUN 19 19 23   CREATININE 1.13 1.19 0.89  CALCIUM 8.6* 8.6* 8.7*   GFR: Estimated Creatinine Clearance: 64 mL/min (by C-G formula based on SCr of 0.89 mg/dL). Liver Function Tests: No results for input(s): AST, ALT, ALKPHOS, BILITOT, PROT, ALBUMIN in the last 168 hours. No results for input(s): LIPASE, AMYLASE in the last 168 hours. No results for input(s): AMMONIA in the last 168 hours. Coagulation Profile: Recent Labs  Lab 08/18/18 1834 08/19/18 0603 08/20/18 0541  INR 2.0* 1.8* 1.8*   Cardiac Enzymes: No results for input(s): CKTOTAL, CKMB, CKMBINDEX, TROPONINI in the last 168 hours. BNP (last 3 results) No results for input(s): PROBNP in the last 8760 hours. HbA1C: No results for input(s): HGBA1C in the last 72 hours. CBG: No results for input(s): GLUCAP in the last 168 hours. Lipid Profile: No results for input(s): CHOL, HDL, LDLCALC, TRIG, CHOLHDL, LDLDIRECT in the last 72 hours. Thyroid Function Tests: No results for input(s): TSH, T4TOTAL, FREET4, T3FREE, THYROIDAB in the last 72 hours. Anemia Panel: No results for input(s): VITAMINB12, FOLATE,  FERRITIN, TIBC, IRON, RETICCTPCT in the last 72 hours. Sepsis Labs: Recent Labs  Lab 08/18/18 1819  LATICACIDVEN 1.8    Recent Results (from the past 240 hour(s))  Culture, blood (routine x 2)     Status: None (Preliminary result)   Collection Time: 08/18/18  6:19 PM  Result Value Ref Range Status   Specimen Description   Final    BLOOD LEFT HAND Performed at [facility] [address] [place], [place] [address]    Special Requests   Final    BOTTLES DRAWN AEROBIC AND ANAEROBIC Blood Culture adequate volume Performed at [facility] [address] [place], [place] [address]    Culture   Final    NO GROWTH 2 DAYS Performed at [facility] [address] [place], [place] [address]    Report Status PENDING  Incomplete  Culture, blood (routine x 2)     Status: None (Preliminary result)   Collection Time: 08/18/18  6:29 PM  Result Value Ref Range Status   Specimen Description   Final    BLOOD RIGHT HAND Performed at [facility] [address]  [place], [place] [address]    Special Requests   Final    BOTTLES DRAWN AEROBIC AND ANAEROBIC Blood Culture adequate volume Performed at [facility] [address], [place], [place] [address]    Culture   Final    NO GROWTH 2 DAYS Performed at [facility] [address], [place], [place] [address]    Report Status PENDING  Incomplete  Culture, Urine     Status: None   Collection Time: 08/18/18 10:10 PM  Result Value Ref Range Status   Specimen Description   Final    URINE, RANDOM Performed at [facility] [address] [place], [place] [address]    Special Requests   Final    NONE Performed at [facility], [place] [address] [place], [place] [address]    Culture   Final    NO GROWTH Performed at [facility], [place] [address] [place], [place] [address]    Report Status 08/20/2018  FINAL  Final  Surgical PCR screen     Status: None   Collection Time: 08/19/18 12:46 PM  Result Value Ref Range Status   MRSA, PCR NEGATIVE NEGATIVE Final   Staphylococcus aureus NEGATIVE NEGATIVE Final    Comment: (NOTE) The Xpert SA Assay (FDA approved for NASAL specimens in patients 7 years of age and older), is one component of a comprehensive surveillance program. It is not intended to diagnose infection nor to guide or monitor treatment. Performed at [facility], [place] [address], [place], [place] [address]   Aerobic/Anaerobic Culture (surgical/deep wound)     Status: None (Preliminary result)   Collection Time: 08/19/18  4:19 PM  Result Value Ref Range Status   Specimen Description   Final    TISSUE TRANSURETHRAL RESECTION OF THE PROSTATE TURP Performed at [facility] [address] [place], [place] [address]    Special Requests   Final    NONE Performed at [facility], [place] [address] [place], [place] [address]    Gram Stain   Final    ABUNDANT WBC PRESENT, PREDOMINANTLY MONONUCLEAR NO ORGANISMS SEEN Performed at [facility], [place] [address], [place], [place] [address]    Culture PENDING  Incomplete   Report Status PENDING  Incomplete         Radiology Studies: No results found.      Scheduled Meds: . ezetimibe  10 mg Oral Daily  . metoprolol tartrate  37.5 mg Oral BID  . mometasone-formoterol  2 puff Inhalation BID  . mupirocin ointment  1 application Nasal BID  . tamsulosin  0.4 mg Oral Daily   Continuous Infusions: . meropenem (MERREM) IV Stopped (08/20/18 0650)     LOS: 2 days    Time spent: 25 mins.More than 50% of that time was spent in counseling and/or coordination of care.      Shelly Coss, MD [facility] Pager [phone]  If 7PM-7AM, please contact night-coverage [email or web address] Password [facility] 08/20/2018, 11:48 AM

## 2018-08-21 LAB — CBC WITH DIFFERENTIAL/PLATELET
Abs Immature Granulocytes: 0.11 10*3/uL — ABNORMAL HIGH (ref 0.00–0.07)
BASOS ABS: 0 10*3/uL (ref 0.0–0.1)
Basophils Relative: 0 %
Eosinophils Absolute: 0 10*3/uL (ref 0.0–0.5)
Eosinophils Relative: 0 %
HCT: 41.3 % (ref 39.0–52.0)
Hemoglobin: 12.8 g/dL — ABNORMAL LOW (ref 13.0–17.0)
Immature Granulocytes: 1 %
Lymphocytes Relative: 6 %
Lymphs Abs: 1.1 10*3/uL (ref 0.7–4.0)
MCH: 29.2 pg (ref 26.0–34.0)
MCHC: 31 g/dL (ref 30.0–36.0)
MCV: 94.1 fL (ref 80.0–100.0)
Monocytes Absolute: 1.4 10*3/uL — ABNORMAL HIGH (ref 0.1–1.0)
Monocytes Relative: 8 %
NEUTROS PCT: 85 %
NRBC: 0 % (ref 0.0–0.2)
Neutro Abs: 14 10*3/uL — ABNORMAL HIGH (ref 1.7–7.7)
Platelets: 196 10*3/uL (ref 150–400)
RBC: 4.39 MIL/uL (ref 4.22–5.81)
RDW: 13.7 % (ref 11.5–15.5)
WBC: 16.5 10*3/uL — AB (ref 4.0–10.5)

## 2018-08-21 LAB — PROTIME-INR
INR: 1.6 — ABNORMAL HIGH (ref 0.8–1.2)
Prothrombin Time: 19.2 seconds — ABNORMAL HIGH (ref 11.4–15.2)

## 2018-08-21 MED ORDER — CIPROFLOXACIN HCL 500 MG PO TABS: 500.0000 mg | ORAL_TABLET | Freq: Two times a day (BID) | ORAL | 0 refills | Status: AC

## 2018-08-21 MED ORDER — CEFDINIR 300 MG PO CAPS: 300.0000 mg | ORAL_CAPSULE | Freq: Two times a day (BID) | ORAL | 0 refills | Status: DC

## 2018-08-21 NOTE — Progress Notes (Signed)
CBI in place, clear/yellow overnight.

## 2018-08-21 NOTE — Progress Notes (Signed)
Pharmacy - Antimicrobial Stewardship  Urine cx from 08/18/2018 at [facility]:  Pseudomonas aeruginosa Susceptible to ciprofloxaxin, cefepime, piperacillin/tazobactam, gentamicin, meropenem  Plan:  Updated Dr Tawanna Solo with above urine cx.  Cancel outpatient rx for cefdinir and start ciprofloxacin  x24 days for prostate abscess  Inform patient will need closer monitoring of INR due to possible drug-drug interactiion with ciprofloxacin and warfarin  Doreene Eland, PharmD, BCPS.   Work Cell: [phone] 08/21/2018 11:34 AM

## 2018-08-21 NOTE — Discharge Summary (Addendum)
Physician Discharge Summary  [name] ERD:[id number] DOB: 31-May-1941 DOA: 08/18/2018  PCP: Sean Brooklyn, FNP  Admit date: 08/18/2018 Discharge date: 08/21/2018  Admitted From: Home Disposition:  Home  Discharge Condition:Stable CODE STATUS:FULL Diet recommendation: Heart Healthy   Brief/Interim Summary:  Sean Viviano Hankinsis a 78 y.o.malewith medical history significant ofchronic A. fib, carotid stenosis, hyperlipidemia, coronary disease, aortic regurgitation, COPDwho was sent from for direct admission from [name] the evaluation of  prostatic abscess.  He has been started on IV antibiotic.  Urology was following. He underwent transection of the prostate on 08/19/18.  His blood culture and urine culture did not show any growth.  After discussion with urology, we will discharge the patient home today with oral antibiotics for total of 4 weeks. He will follow-up with urology on discharge.  Following problems were addressed during his hospitalization :  Prostatic abscess: Currently hemodynamically stable. Was on meropenem. NGTD: blood cultures, urine cultures. Imaging at outside hospital showed prostatic abscess, could not retrieve the imagings here Has leucocytosis but improving.  IV fluids stopped. Underwent transection of the prostate for prostatic abscess by urology on 08/19/2018. On continuous bladder irrigation which has been continued.  Will continue antibiotics for total of 4 weeks. Urine culture from [name] [place] showed Pseudomonas so we will discharge him on ciprofloxacin 500 mg twice a day for total of 24 days to make total antibiotics coverage of 4 weeks.  Chronic A. fib: Heart rate is well controlled.CHADS2Vasc score is 5. Restarted his home dose metoprolol. WasOn warfarin for anticoagulation.Follows with cardiology, Dr. Harl [name] at [place]. Currently warfarin is on hold due to high risks of bleeding after surgery.  Will resume on discharge.  History of  coronary artery disease: Mild disease as per prior catheterization. Continue medical management. No recent symptoms.  Hyperlipidemia:Intolerant to multiple statins. On Zetia which we will continue.  History of chronic diastolic CHF: History of diastolic CHF as per his cardiologist'snote .Currently euvolemic. On Lasix as needed at home.  COPD:Currently stable. Continue PRN bronchodilators. On Symbicort at home . Follows with pulmonology as an outpatient. Past smoker.  Deconditioning/debility: Complaining of recent difficulty in ambulation.  Complains of generalized body ache and weakness. PT didnot recommended follow-up.   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  Do a CBC test during the follow-up. 2) Take prescribed medications as instructed. 3)Follow up with urology as an outpatient.   Increase activity slowly   Complete by:  As directed      Allergies as of 08/21/2018      Reactions   Asa [aspirin]    GI upset   Niacin    REACTION: hot flashes      Medication List    TAKE these medications   acetaminophen 500 MG tablet Commonly known as:  TYLENOL Take 1,000 mg by mouth daily as needed.   albuterol 108 (90 Base) MCG/ACT inhaler Commonly known as:  PROAIR HFA Inhale 2 puffs into the lungs every 4 (four) hours as needed for wheezing. 2 puffs  every 4 hours as needed only  if your can't catch your breath   cefdinir 300 MG capsule Commonly known as:  OMNICEF Take 1 capsule (300 mg total) by mouth 2 (two) times daily for 24 days.   ezetimibe 10  MG tablet Commonly known as:  ZETIA TAKE 1 TABLET EVERY DAY (DISCONTINUE PRAVASTATIN) What changed:  See the new instructions.   furosemide 40 MG tablet Commonly known as:  LASIX Take one tab daily x 3 days, then only as needed thereafter for swelling.   metoprolol tartrate 25 MG tablet Commonly known as:  LOPRESSOR Take 1.5 tablets (37.5 mg total) by mouth 2 (two) times daily.   SYMBICORT 160-4.5 MCG/ACT inhaler Generic drug:  budesonide-formoterol INHALE 2 PUFFS INTO THE LUNGS EVERY MORNING -APPOINTMENT WITH DR [name] NEEDED FOR FURTHER REFILLS What changed:  See the new instructions.   tamsulosin 0.4 MG Caps capsule Commonly known as:  FLOMAX Take 0.4 mg by mouth daily.   traZODone 50 MG tablet Commonly known as:  DESYREL Take 50-100 mg by mouth at bedtime as needed for sleep.   warfarin 5 MG tablet Commonly known as:  COUMADIN Take as directed. If you are unsure how to take this medication, talk to your nurse or doctor. Original instructions:  Take 1 tablet daily except 1/2 tablet on Tuesdays and Fridays or as directd What changed:    how much to take  how to take this  when to take this  additional instructions      Follow-up Information    Sean Brooklyn, FNP. Schedule an appointment as soon as possible for a visit in 1 week(s).   Specialty:  Family Medicine Contact information: [place] [address] [phone]        Sean Lenis, MD .   Specialty:  Cardiology Contact information: [place] [address] [phone]          Allergies  Allergen Reactions  . Asa [Aspirin]     GI upset  . Niacin     REACTION: hot flashes    Consultations:  urology   Procedures/Studies:  No results found.    Subjective: Patient seen and examined the bedside this morning.  Remains comfortable.  Hemodynamically stable for discharge today.  Discharge Exam: Vitals:   08/20/18 1510 08/20/18 2143   BP: (!) 157/78 (!) 142/76  Pulse: 98 92  Resp: 20 16  Temp: 98.2 F (36.8 C) 98.1 F (36.7 C)  SpO2: 96% 97%   Vitals:   08/19/18 2200 08/20/18 0425 08/20/18 1510 08/20/18 2143  BP:  (!) 142/84 (!) 157/78 (!) 142/76  Pulse:  85 98 92  Resp: 16 18 20 16   Temp:  97.8 F (36.6 C) 98.2 F (36.8 C) 98.1 F (36.7 C)  TempSrc:  Oral Oral Oral  SpO2: 98% 100% 96% 97%  Weight:      Height:        General: Pt is alert, awake, not in acute distress Cardiovascular: RRR, S1/S2 +, no rubs, no gallops Respiratory: CTA bilaterally, no wheezing, no rhonchi Abdominal: Soft, NT, ND, bowel sounds + Extremities: no edema, no cyanosis    The results of significant diagnostics from this hospitalization (including imaging, microbiology, ancillary and laboratory) are listed below for reference.     Microbiology: Recent Results (from the past 240 hour(s))  Culture, blood (routine x 2)     Status: None (Preliminary result)   Collection Time: 08/18/18  6:19 PM  Result Value Ref Range Status   Specimen Description   Final    BLOOD LEFT HAND Performed at [facility] [address], [place], [place] [address]    Special Requests   Final    BOTTLES DRAWN AEROBIC AND ANAEROBIC Blood Culture adequate volume Performed at [facility] [address], [place], [place] [address]    Culture   Final    NO GROWTH 2 DAYS Performed at [facility] [address] [place], [place] [address]    Report Status PENDING  Incomplete  Culture, blood (routine x 2)     Status: None (Preliminary result)   Collection Time: 08/18/18  6:29 PM  Result Value Ref Range Status   Specimen Description   Final    BLOOD RIGHT HAND Performed at [facility] [address] [place], [place] [address]    Special Requests   Final    BOTTLES DRAWN AEROBIC AND ANAEROBIC Blood Culture adequate volume Performed at [facility]  [address] [place], [place] [address]    Culture   Final    NO GROWTH 2 DAYS Performed at [facility] [address] [place], [place] [address]    Report Status PENDING  Incomplete  Culture, Urine     Status: None   Collection Time: 08/18/18 10:10 PM  Result Value Ref Range Status   Specimen Description   Final    URINE, RANDOM Performed at [facility] [address] [place], [place] [address]    Special Requests   Final    NONE Performed at [name], [place] [address] [place], [place] [address]    Culture   Final    NO GROWTH Performed at [facility], [place] [address] [place], [place] [address]    Report Status 08/20/2018 FINAL  Final  Surgical PCR screen     Status: None   Collection Time: 08/19/18 12:46 PM  Result Value Ref Range Status   MRSA, PCR NEGATIVE NEGATIVE Final   Staphylococcus aureus NEGATIVE NEGATIVE Final    Comment: (NOTE) The Xpert SA Assay (FDA approved for NASAL specimens in patients 52 years of age and older), is one component of a comprehensive surveillance program. It is not intended to diagnose infection nor to guide or monitor treatment. Performed at [name], [place] [address] [place], [place] [address]   Aerobic/Anaerobic Culture (surgical/deep wound)     Status: None (Preliminary result)   Collection Time: 08/19/18  4:19 PM  Result Value Ref Range Status   Specimen Description   Final    TISSUE TRANSURETHRAL RESECTION OF THE PROSTATE TURP Performed at [facility] [address] [place], [place] [address]    Special Requests   Final    NONE Performed at [facility], [place] [address], [place], [place] [address]    Gram Stain   Final    ABUNDANT WBC PRESENT, PREDOMINANTLY MONONUCLEAR NO ORGANISMS SEEN Performed at [facility], [place] [address], [place], [place] [address]    Culture   Final    CULTURE REINCUBATED FOR  BETTER GROWTH NO ANAEROBES ISOLATED; CULTURE IN PROGRESS FOR 5 DAYS    Report Status PENDING  Incomplete     Labs: BNP (last 3 results) No results for input(s): BNP in the last 8760 hours. Basic Metabolic Panel: Recent Labs  Lab 08/18/18 1818  08/19/18 0603 08/20/18 0541  NA 136 138 138  K 3.1* 4.0 4.4  CL 100 106 106  CO2 27 24 26   GLUCOSE 117* 121* 162*  BUN 19 19 23   CREATININE 1.13 1.19 0.89  CALCIUM 8.6* 8.6* 8.7*   Liver Function Tests: No results for input(s): AST, ALT, ALKPHOS, BILITOT, PROT, ALBUMIN in the last 168 hours. No results for input(s): LIPASE, AMYLASE in the last 168 hours. No results for input(s): AMMONIA in the last 168 hours. CBC: Recent Labs  Lab 08/18/18 1818 08/19/18 0603 08/20/18 0541 08/21/18 0528  WBC 27.4* 24.7* 14.6* 16.5*  NEUTROABS 22.7* 20.2* 13.3* 14.0*  HGB 13.9 13.5 13.5 12.8*  HCT 44.2 44.1 45.8 41.3  MCV 93.1 95.5 96.2 94.1  PLT 174 167 153 196   Cardiac Enzymes: No results for input(s): CKTOTAL, CKMB, CKMBINDEX, TROPONINI in the last 168 hours. BNP: Invalid input(s): POCBNP CBG: No results for input(s): GLUCAP in the last 168 hours. D-Dimer No results for input(s): DDIMER in the last 72 hours. Hgb A1c No results for input(s): HGBA1C in the last 72 hours. Lipid Profile No results for input(s): CHOL, HDL, LDLCALC, TRIG, CHOLHDL, LDLDIRECT in the last 72 hours. Thyroid function studies No results for input(s): TSH, T4TOTAL, T3FREE, THYROIDAB in the last 72 hours.  Invalid input(s): FREET3 Anemia work up No results for input(s): VITAMINB12, FOLATE, FERRITIN, TIBC, IRON, RETICCTPCT in the last 72 hours. Urinalysis    Component Value Date/Time   COLORURINE YELLOW 08/18/2018 2210   APPEARANCEUR HAZY (A) 08/18/2018 2210   LABSPEC 1.025 08/18/2018 2210   PHURINE 5.0 08/18/2018 2210   GLUCOSEU NEGATIVE 08/18/2018 2210   HGBUR MODERATE (A) 08/18/2018 2210   BILIRUBINUR NEGATIVE 08/18/2018 2210   KETONESUR NEGATIVE  08/18/2018 2210   PROTEINUR 30 (A) 08/18/2018 2210   NITRITE NEGATIVE 08/18/2018 2210   LEUKOCYTESUR NEGATIVE 08/18/2018 2210   Sepsis Labs Invalid input(s): PROCALCITONIN,  WBC,  LACTICIDVEN Microbiology Recent Results (from the past 240 hour(s))  Culture, blood (routine x 2)     Status: None (Preliminary result)   Collection Time: 08/18/18  6:19 PM  Result Value Ref Range Status   Specimen Description   Final    BLOOD LEFT HAND Performed at [name], [place] [address], [place], [place] [address]    Special Requests   Final    BOTTLES DRAWN AEROBIC AND ANAEROBIC Blood Culture adequate volume Performed at [facility] [address] [place], [place] [address]    Culture   Final    NO GROWTH 2 DAYS Performed at [facility] [address], [place], [place] [address]    Report Status PENDING  Incomplete  Culture, blood (routine x 2)     Status: None (Preliminary result)   Collection Time: 08/18/18  6:29 PM  Result Value Ref Range Status   Specimen Description   Final    BLOOD RIGHT HAND Performed at [facility] [address], [place], [place] [address]    Special Requests   Final    BOTTLES DRAWN AEROBIC AND ANAEROBIC Blood Culture adequate volume Performed at [facility] [address] [place], [place] [address]    Culture   Final    NO GROWTH 2 DAYS Performed at [facility] [address] [place], [place] [address]    Report Status PENDING  Incomplete  Culture, Urine     Status: None   Collection Time: 08/18/18 10:10 PM  Result Value Ref Range Status   Specimen Description   Final    URINE, RANDOM Performed at [facility] [address], [place], [place] [address]    Special Requests   Final    NONE Performed at [name], [place] [address], [place], [place] [address]    Culture   Final    NO GROWTH Performed at [facility], [place] [address] [place], [place] [address]    Report Status 08/20/2018 FINAL  Final  Surgical PCR screen     Status: None   Collection Time: 08/19/18 12:46 PM  Result Value Ref Range Status   MRSA, PCR NEGATIVE NEGATIVE Final   Staphylococcus aureus NEGATIVE NEGATIVE Final    Comment: (NOTE) The Xpert SA Assay (FDA approved for NASAL specimens in patients 58 years of age and older), is one component of a comprehensive surveillance program. It is not intended to diagnose infection nor to guide or monitor treatment. Performed at [name], [place] [address], [place], [place] [address]   Aerobic/Anaerobic Culture (surgical/deep wound)     Status: None (Preliminary result)   Collection Time: 08/19/18  4:19 PM  Result Value Ref Range Status   Specimen Description   Final    TISSUE TRANSURETHRAL RESECTION OF THE PROSTATE TURP Performed at [facility] [address] [place], [place] [address]    Special Requests   Final    NONE Performed at [facility], [place] [address] [place], [place] [address]    Gram Stain   Final    ABUNDANT WBC PRESENT, PREDOMINANTLY MONONUCLEAR NO ORGANISMS SEEN Performed at [facility], [place] [address], [place], [place] [address]    Culture   Final    CULTURE REINCUBATED FOR BETTER GROWTH NO ANAEROBES ISOLATED; CULTURE IN PROGRESS FOR 5 DAYS    Report Status PENDING  Incomplete    Please note: You were cared for by a hospitalist during your hospital stay. Once you are discharged, your primary care physician will handle any further medical issues. Please note that NO REFILLS for any discharge medications will be authorized once you are discharged, as it is imperative that you return to your primary care physician (or establish a relationship with a primary care physician if you do not have one) for your post hospital discharge needs so that they can reassess your need for medications  and monitor your lab values.    Time coordinating discharge: 40 minutes  SIGNED:   Shelly Coss, MD  [facility] 08/21/2018, 11:14 AM Pager [phone]  If 7PM-7AM, please contact night-coverage [email or web address] Password [email or web address]

## 2018-08-21 NOTE — Progress Notes (Signed)
Pharmacy Antibiotic Note  [name] is a 78 y.o. male  transferred from [facility] to [facility] on 3/2 for workup and management of prostatic abscess. 08/21/2018 Abx D#4. WBC 16.5 (got decadron 10 mg 3/3) . AF.  POD # 2 Transurethral resection of prostate abscess/unroofing - CBI in place> clear/yellow. All culture data negative to date.   Plan:  continue meropenem 1gm IV q8h _____________________________________  Height: 5\' 5"  (165.1 cm) Weight: 167 lb 1.7 oz (75.8 kg) IBW/kg (Calculated) : 61.5  Temp (24hrs), Avg:98.2 F (36.8 C), Min:98.1 F (36.7 C), Max:98.2 F (36.8 C)  Recent Labs  Lab 08/18/18 1818 08/18/18 1819 08/19/18 0603 08/20/18 0541 08/21/18 0528  WBC 27.4*  --  24.7* 14.6* 16.5*  CREATININE 1.13  --  1.19 0.89  --   LATICACIDVEN  --  1.8  --   --   --     Estimated Creatinine Clearance: 64 mL/min (by C-G formula based on SCr of 0.89 mg/dL).    Allergies  Allergen Reactions  . Asa [Aspirin]     GI upset  . Niacin     REACTION: hot flashes    Antimicrobials this admission:  3/2 CTX x1 3/2 merrem>> Dose adjustments this admission:   Microbiology results:  3/3 Surgical PCR> neg MRSA neg MSSA 3/2 Ucx> NGF 3/2 BCx2>ngtd 3/3 prostate tissue> gram stain> WBC, PMNs, no organsisms seen, NGTD  Thank you for allowing pharmacy to be a part of this patient's care.  Eudelia Bunch, Pharm.D [phone] 08/21/2018 9:52 AM

## 2018-08-21 NOTE — Progress Notes (Signed)
Pt discharged home today per Dr. Tawanna Solo. Pt's IV site D/C'd and WDL. Pt's VSS. Pt provided with home medication list, discharge instructions and prescriptions. Verbalized understanding. Pt left floor via WC in stable condition accompanied by NT.

## 2018-08-21 NOTE — Care Management Important Message (Signed)
Important Message  Patient Details  Name: [name] MRN: [id number] Date of Birth: Sep 02, 1940   Medicare Important Message Given:  Yes    [name] 08/21/2018, 9:40 AM

## 2018-08-23 LAB — CULTURE, BLOOD (ROUTINE X 2)
Culture: NO GROWTH
Special Requests: ADEQUATE

## 2018-08-25 ENCOUNTER — Telehealth: Payer: Self-pay | Admitting: Cardiology

## 2018-08-25 LAB — CULTURE, BLOOD (ROUTINE X 2)
Culture: NO GROWTH
Special Requests: ADEQUATE

## 2018-08-25 LAB — AEROBIC/ANAEROBIC CULTURE (SURGICAL/DEEP WOUND)

## 2018-08-25 LAB — AEROBIC/ANAEROBIC CULTURE W GRAM STAIN (SURGICAL/DEEP WOUND)

## 2018-08-25 NOTE — Telephone Encounter (Signed)
Patient called stating that he has appointment with coumdin clinic on Tuesday March 10th. He is wanting to know if Dr. Harl Bowie needs to see him after the coumdin check.

## 2018-08-25 NOTE — Telephone Encounter (Signed)
Pt aware that he would not need to see Dr Harl Bowie after his coumadin appt tomorrow

## 2018-08-26 ENCOUNTER — Ambulatory Visit (INDEPENDENT_AMBULATORY_CARE_PROVIDER_SITE_OTHER): Payer: Medicare Other | Admitting: *Deleted

## 2018-08-26 DIAGNOSIS — I4891 Unspecified atrial fibrillation: Secondary | ICD-10-CM | POA: Diagnosis not present

## 2018-08-26 DIAGNOSIS — I482 Chronic atrial fibrillation, unspecified: Secondary | ICD-10-CM | POA: Diagnosis not present

## 2018-08-26 DIAGNOSIS — Z5181 Encounter for therapeutic drug level monitoring: Secondary | ICD-10-CM

## 2018-08-26 LAB — POCT INR: INR: 1.2 — AB (ref 2.0–3.0)

## 2018-08-26 NOTE — Patient Instructions (Signed)
Restart coumadin 1 tablet daily except 1/2 tablet on Tuedays and Fridays Recheck in 1 week

## 2018-08-28 ENCOUNTER — Other Ambulatory Visit: Payer: Self-pay | Admitting: Cardiology

## 2018-09-02 ENCOUNTER — Ambulatory Visit (INDEPENDENT_AMBULATORY_CARE_PROVIDER_SITE_OTHER): Payer: Medicare Other | Admitting: *Deleted

## 2018-09-02 ENCOUNTER — Other Ambulatory Visit: Payer: Self-pay

## 2018-09-02 DIAGNOSIS — Z5181 Encounter for therapeutic drug level monitoring: Secondary | ICD-10-CM

## 2018-09-02 DIAGNOSIS — I482 Chronic atrial fibrillation, unspecified: Secondary | ICD-10-CM

## 2018-09-02 DIAGNOSIS — I480 Paroxysmal atrial fibrillation: Secondary | ICD-10-CM

## 2018-09-02 LAB — POCT INR: INR: 2.1 (ref 2.0–3.0)

## 2018-09-02 NOTE — Patient Instructions (Signed)
Continue coumadin 1 tablet daily except 1/2 tablet on Tuesdays and Fridays Recheck in 1 week On cipro bid x 24 days for prostate abcess (Appx 1 more weeks)

## 2018-09-05 ENCOUNTER — Ambulatory Visit (INDEPENDENT_AMBULATORY_CARE_PROVIDER_SITE_OTHER): Payer: Medicare Other | Admitting: Urology

## 2018-09-05 ENCOUNTER — Other Ambulatory Visit (HOSPITAL_COMMUNITY)
Admission: RE | Admit: 2018-09-05 | Discharge: 2018-09-05 | Disposition: A | Discharge status: Home / Self Care | Payer: Medicare Other | Source: Other Acute Inpatient Hospital | Attending: Urology | Admitting: Urology

## 2018-09-05 ENCOUNTER — Other Ambulatory Visit: Payer: Self-pay

## 2018-09-05 DIAGNOSIS — N412 Abscess of prostate: Secondary | ICD-10-CM | POA: Diagnosis present

## 2018-09-05 DIAGNOSIS — N400 Enlarged prostate without lower urinary tract symptoms: Secondary | ICD-10-CM

## 2018-09-05 LAB — URINALYSIS, COMPLETE (UACMP) WITH MICROSCOPIC
Bacteria, UA: NONE SEEN
Bilirubin Urine: NEGATIVE
Glucose, UA: NEGATIVE mg/dL
Ketones, ur: NEGATIVE mg/dL
Nitrite: NEGATIVE
Protein, ur: 30 mg/dL — AB
RBC / HPF: >50 RBC/hpf — ABNORMAL HIGH (ref 0–5)
Specific Gravity, Urine: 1.019 (ref 1.005–1.030)
pH: 5 (ref 5.0–8.0)

## 2018-09-07 LAB — URINE CULTURE: Culture: NO GROWTH

## 2018-09-15 ENCOUNTER — Telehealth: Payer: Self-pay | Admitting: *Deleted

## 2018-09-15 ENCOUNTER — Other Ambulatory Visit: Payer: Self-pay

## 2018-09-15 NOTE — Telephone Encounter (Signed)

## 2018-09-16 ENCOUNTER — Ambulatory Visit (INDEPENDENT_AMBULATORY_CARE_PROVIDER_SITE_OTHER): Payer: Medicare Other | Admitting: *Deleted

## 2018-09-16 DIAGNOSIS — Z5181 Encounter for therapeutic drug level monitoring: Secondary | ICD-10-CM

## 2018-09-16 DIAGNOSIS — I4891 Unspecified atrial fibrillation: Secondary | ICD-10-CM | POA: Diagnosis not present

## 2018-09-16 DIAGNOSIS — I482 Chronic atrial fibrillation, unspecified: Secondary | ICD-10-CM

## 2018-09-16 LAB — POCT INR: INR: 3 (ref 2.0–3.0)

## 2018-09-16 NOTE — Patient Instructions (Addendum)
Hold coumadin tonight then decrease dose to 1 tablet daily except 1/2 tablet on Tuesdays, Thursdays and Saturdays Recheck in 4 weeks

## 2018-10-01 ENCOUNTER — Ambulatory Visit (INDEPENDENT_AMBULATORY_CARE_PROVIDER_SITE_OTHER): Payer: Medicare Other | Admitting: Urology

## 2018-10-01 DIAGNOSIS — R310 Gross hematuria: Secondary | ICD-10-CM

## 2018-10-01 DIAGNOSIS — N410 Acute prostatitis: Secondary | ICD-10-CM

## 2018-10-08 ENCOUNTER — Telehealth: Payer: Self-pay | Admitting: Cardiology

## 2018-10-08 NOTE — Telephone Encounter (Signed)
° °  COVID-19 Pre-Screening Questions: ° °• Do you currently have a fever?NO ° ° °• Have you recently travelled on a cruise, internationally, or to [place], [place], [place], [place], [place], or [place], [place] ([employer]) ? NO °•  °• Have you been in contact with someone that is currently pending confirmation of Covid19 testing or has been confirmed to have the Covid19 virus?  NO °•  °Are you currently experiencing fatigue or cough? NO ° ° °   ° ° ° ° °

## 2018-10-09 ENCOUNTER — Ambulatory Visit (INDEPENDENT_AMBULATORY_CARE_PROVIDER_SITE_OTHER): Payer: Medicare Other | Admitting: *Deleted

## 2018-10-09 DIAGNOSIS — Z5181 Encounter for therapeutic drug level monitoring: Secondary | ICD-10-CM | POA: Diagnosis not present

## 2018-10-09 DIAGNOSIS — I482 Chronic atrial fibrillation, unspecified: Secondary | ICD-10-CM

## 2018-10-09 DIAGNOSIS — I4891 Unspecified atrial fibrillation: Secondary | ICD-10-CM

## 2018-10-09 LAB — POCT INR: INR: 1.8 — AB (ref 2.0–3.0)

## 2018-10-09 NOTE — Patient Instructions (Signed)
Increase coumadin to 1 tablet daily except 1/2 tablet on Tuesdays and Saturdays Recheck in 4 weeks

## 2018-11-06 ENCOUNTER — Ambulatory Visit (INDEPENDENT_AMBULATORY_CARE_PROVIDER_SITE_OTHER): Payer: Medicare Other | Admitting: *Deleted

## 2018-11-06 DIAGNOSIS — I482 Chronic atrial fibrillation, unspecified: Secondary | ICD-10-CM

## 2018-11-06 DIAGNOSIS — I4891 Unspecified atrial fibrillation: Secondary | ICD-10-CM | POA: Diagnosis not present

## 2018-11-06 DIAGNOSIS — Z5181 Encounter for therapeutic drug level monitoring: Secondary | ICD-10-CM

## 2018-11-06 LAB — POCT INR: INR: 2 (ref 2.0–3.0)

## 2018-11-06 NOTE — Patient Instructions (Signed)
Continue coumadin 1 tablet daily except 1/2 tablet on Tuesdays and Saturdays Recheck in 4 weeks 

## 2018-11-12 ENCOUNTER — Ambulatory Visit (INDEPENDENT_AMBULATORY_CARE_PROVIDER_SITE_OTHER): Payer: Medicare Other | Admitting: Urology

## 2018-11-12 DIAGNOSIS — R310 Gross hematuria: Secondary | ICD-10-CM

## 2018-11-12 DIAGNOSIS — N401 Enlarged prostate with lower urinary tract symptoms: Secondary | ICD-10-CM

## 2018-11-25 ENCOUNTER — Other Ambulatory Visit: Payer: Self-pay | Admitting: Cardiology

## 2018-12-03 ENCOUNTER — Telehealth: Payer: Self-pay | Admitting: *Deleted

## 2018-12-03 NOTE — Telephone Encounter (Signed)
Virtual Visit Pre-Appointment Phone Call  "(Name), I am calling you today to discuss your upcoming appointment. We are currently trying to limit exposure to the virus that causes COVID-19 by seeing patients at home rather than in the office."  1. "What is the BEST phone number to call the day of the visit?" - include this in appointment notes  2. Do you have or have access to (through a family member/friend) a smartphone with video capability that we can use for your visit?" a. If yes - list this number in appt notes as cell (if different from BEST phone #) and list the appointment type as a VIDEO visit in appointment notes b. If no - list the appointment type as a PHONE visit in appointment notes  3. Confirm consent - "In the setting of the current Covid19 crisis, you are scheduled for a (phone or video) visit with your provider on (date) at (time).  Just as we do with many in-office visits, in order for you to participate in this visit, we must obtain consent.  If you'd like, I can send this to your mychart (if signed up) or email for you to review.  Otherwise, I can obtain your verbal consent now.  All virtual visits are billed to your insurance company just like a normal visit would be.  By agreeing to a virtual visit, we'd like you to understand that the technology does not allow for your provider to perform an examination, and thus may limit your provider's ability to fully assess your condition. If your provider identifies any concerns that need to be evaluated in person, we will make arrangements to do so.  Finally, though the technology is pretty good, we cannot assure that it will always work on either your or our end, and in the setting of a video visit, we may have to convert it to a phone-only visit.  In either situation, we cannot ensure that we have a secure connection.  Are you willing to proceed?" STAFF: Did the patient verbally acknowledge consent to telehealth visit? Document  YES/NO here: YES  4. Advise patient to be prepared - "Two hours prior to your appointment, go ahead and check your blood pressure, pulse, oxygen saturation, and your weight (if you have the equipment to check those) and write them all down. When your visit starts, your provider will ask you for this information. If you have an Apple Watch or Kardia device, please plan to have heart rate information ready on the day of your appointment. Please have a pen and paper handy nearby the day of the visit as well."  5. Give patient instructions for MyChart download to smartphone OR [email or web address] as below if video visit (depending on what platform provider is using)  6. Inform patient they will receive a phone call 15 minutes prior to their appointment time (may be from unknown caller ID) so they should be prepared to answer    TELEPHONE CALL NOTE  [name] has been deemed a candidate for a follow-up tele-health visit to limit community exposure during the Covid-19 pandemic. I spoke with the patient via phone to ensure availability of phone/video source, confirm preferred email & phone number, and discuss instructions and expectations.  I reminded [name] to be prepared with any vital sign and/or heart rhythm information that could potentially be obtained via home monitoring, at the time of his visit. I reminded [name] to expect a phone call prior to  his visit.  [name] 12/03/2018 9:53 AM   INSTRUCTIONS FOR DOWNLOADING THE MYCHART APP TO SMARTPHONE  - The patient must first make sure to have activated MyChart and know their login information - If Apple, go to [employer] and type in MyChart in the search bar and download the app. If Android, ask patient to go to [employer] and type in [place] in the search bar and download the app. The app is free but as with any other app downloads, their phone may require them to verify saved payment information or Apple/Android  password.  - The patient will need to then log into the app with their MyChart username and password, and select [facility] as their healthcare provider to link the account. When it is time for your visit, go to the MyChart app, find appointments, and click Begin Video Visit. Be sure to Select Allow for your device to access the Microphone and Camera for your visit. You will then be connected, and your provider will be with you shortly.  **If they have any issues connecting, or need assistance please contact MyChart service desk [phone])**  **If using a computer, in order to ensure the best quality for their visit they will need to use either of the following Internet Browsers: [employer], or Google Chrome**  IF USING DOXIMITY or DOXY.ME - The patient will receive a link just prior to their visit by text.     FULL LENGTH CONSENT FOR TELE-HEALTH VISIT   I hereby voluntarily request, consent and authorize [facility] and its employed or contracted physicians, physician assistants, nurse practitioners or other licensed health care professionals (the Practitioner), to provide me with telemedicine health care services (the Services") as deemed necessary by the treating Practitioner. I acknowledge and consent to receive the Services by the Practitioner via telemedicine. I understand that the telemedicine visit will involve communicating with the Practitioner through live audiovisual communication technology and the disclosure of certain medical information by electronic transmission. I acknowledge that I have been given the opportunity to request an in-person assessment or other available alternative prior to the telemedicine visit and am voluntarily participating in the telemedicine visit.  I understand that I have the right to withhold or withdraw my consent to the use of telemedicine in the course of my care at any time, without affecting my right to future care or treatment,  and that the Practitioner or I may terminate the telemedicine visit at any time. I understand that I have the right to inspect all information obtained and/or recorded in the course of the telemedicine visit and may receive copies of available information for a reasonable fee.  I understand that some of the potential risks of receiving the Services via telemedicine include:   Delay or interruption in medical evaluation due to technological equipment failure or disruption;  Information transmitted may not be sufficient (e.g. poor resolution of images) to allow for appropriate medical decision making by the Practitioner; and/or   In rare instances, security protocols could fail, causing a breach of personal health information.  Furthermore, I acknowledge that it is my responsibility to provide information about my medical history, conditions and care that is complete and accurate to the best of my ability. I acknowledge that Practitioner's advice, recommendations, and/or decision may be based on factors not within their control, such as incomplete or inaccurate data provided by me or distortions of diagnostic images or specimens that may result from electronic transmissions. I  understand that the practice of medicine is not an exact science and that Practitioner makes no warranties or guarantees regarding treatment outcomes. I acknowledge that I will receive a copy of this consent concurrently upon execution via email to the email address I last provided but may also request a printed copy by calling the office of [facility].    I understand that my insurance will be billed for this visit.   I have read or had this consent read to me.  I understand the contents of this consent, which adequately explains the benefits and risks of the Services being provided via telemedicine.   I have been provided ample opportunity to ask questions regarding this consent and the Services and have had my questions  answered to my satisfaction.  I give my informed consent for the services to be provided through the use of telemedicine in my medical care  By participating in this telemedicine visit I agree to the above.

## 2018-12-04 ENCOUNTER — Ambulatory Visit (INDEPENDENT_AMBULATORY_CARE_PROVIDER_SITE_OTHER): Payer: Medicare Other | Admitting: *Deleted

## 2018-12-04 DIAGNOSIS — Z5181 Encounter for therapeutic drug level monitoring: Secondary | ICD-10-CM | POA: Diagnosis not present

## 2018-12-04 DIAGNOSIS — I4891 Unspecified atrial fibrillation: Secondary | ICD-10-CM

## 2018-12-04 DIAGNOSIS — I482 Chronic atrial fibrillation, unspecified: Secondary | ICD-10-CM

## 2018-12-04 LAB — POCT INR: INR: 1.6 — AB (ref 2.0–3.0)

## 2018-12-04 NOTE — Patient Instructions (Signed)
Take coumadin 1 1/2 tablets tonight then increase dose to 1 tablet daily except 1/2 tablet on Tuesdays Recheck in 3 weeks

## 2019-01-01 ENCOUNTER — Ambulatory Visit (INDEPENDENT_AMBULATORY_CARE_PROVIDER_SITE_OTHER): Payer: Medicare Other | Admitting: *Deleted

## 2019-01-01 DIAGNOSIS — I482 Chronic atrial fibrillation, unspecified: Secondary | ICD-10-CM

## 2019-01-01 DIAGNOSIS — I4891 Unspecified atrial fibrillation: Secondary | ICD-10-CM | POA: Diagnosis not present

## 2019-01-01 DIAGNOSIS — Z5181 Encounter for therapeutic drug level monitoring: Secondary | ICD-10-CM | POA: Diagnosis not present

## 2019-01-01 LAB — POCT INR: INR: 2.7 (ref 2.0–3.0)

## 2019-01-01 NOTE — Patient Instructions (Signed)
Continue coumadin 1 tablet daily except 1/2 tablet on Tuesdays Recheck in 4 weeks 

## 2019-02-02 ENCOUNTER — Ambulatory Visit (INDEPENDENT_AMBULATORY_CARE_PROVIDER_SITE_OTHER): Payer: Medicare Other | Admitting: Pharmacist Clinician (PhC)/ Clinical Pharmacy Specialist

## 2019-02-02 ENCOUNTER — Other Ambulatory Visit: Payer: Self-pay

## 2019-02-02 DIAGNOSIS — I482 Chronic atrial fibrillation, unspecified: Secondary | ICD-10-CM | POA: Diagnosis not present

## 2019-02-02 DIAGNOSIS — I4891 Unspecified atrial fibrillation: Secondary | ICD-10-CM

## 2019-02-02 DIAGNOSIS — Z5181 Encounter for therapeutic drug level monitoring: Secondary | ICD-10-CM

## 2019-02-02 LAB — POCT INR: INR: 2.4 (ref 2.0–3.0)

## 2019-02-06 ENCOUNTER — Other Ambulatory Visit: Payer: Self-pay | Admitting: Cardiology

## 2019-02-20 ENCOUNTER — Telehealth: Payer: Self-pay | Admitting: Cardiology

## 2019-02-20 NOTE — Telephone Encounter (Signed)
Virtual Visit Pre-Appointment Phone Call  "(Name), I am calling you today to discuss your upcoming appointment. We are currently trying to limit exposure to the virus that causes COVID-19 by seeing patients at home rather than in the office."  "What is the BEST phone number to call the day of the visit?" -   [phone] 1. Do you have or have access to (through a family member/friend) a smartphone with video capability that we can use for your visit?" a. If yes - list this number in appt notes as cell (if different from BEST phone #) and list the appointment type as a VIDEO visit in appointment notes b. If no - list the appointment type as a PHONE visit in appointment notes  Confirm consent - "In the setting of the current Covid19 crisis, you are scheduled for a (phone or video) visit with your provider on (date) at (time).  Just as we do with many in-office visits, in order for you to participate in this visit, we must obtain consent.  If you'd like, I can send this to your mychart (if signed up) or email for you to review.  Otherwise, I can obtain your verbal consent now.  All virtual visits are billed to your insurance company just like a normal visit would be.  By agreeing to a virtual visit, we'd like you to understand that the technology does not allow for your provider to perform an examination, and thus may limit your provider's ability to fully assess your condition. If your provider identifies any concerns that need to be evaluated in person, we will make arrangements to do so.  Finally, though the technology is pretty good, we cannot assure that it will always work on either your or our end, and in the setting of a video visit, we may have to convert it to a phone-only visit.  In either situation, we cannot ensure that we have a secure connection.  Are you willing to proceed?" STAFF: Did the patient verbally acknowledge consent to telehealth visit? Document YES/NO here:  yes   2. Advise patient to be prepared - "Two hours prior to your appointment, go ahead and check your blood pressure, pulse, oxygen saturation, and your weight (if you have the equipment to check those) and write them all down. When your visit starts, your provider will ask you for this information. If you have an Apple Watch or Kardia device, please plan to have heart rate information ready on the day of your appointment. Please have a pen and paper handy nearby the day of the visit as well."  3. Give patient instructions for MyChart download to smartphone OR [email or web address] as below if video visit (depending on what platform provider is using)  4. Inform patient they will receive a phone call 15 minutes prior to their appointment time (may be from unknown caller ID) so they should be prepared to answer    TELEPHONE CALL NOTE  [name] has been deemed a candidate for a follow-up tele-health visit to limit community exposure during the Covid-19 pandemic. I spoke with the patient via phone to ensure availability of phone/video source, confirm preferred email & phone number, and discuss instructions and expectations.  I reminded [name] to be prepared with any vital sign and/or heart rhythm information that could potentially be obtained via home monitoring, at the time of his visit. I reminded [name] to expect a phone call prior to his visit.  Vicky  T Slaughter 02/20/2019 2:00 PM   INSTRUCTIONS FOR DOWNLOADING THE MYCHART APP TO SMARTPHONE  - The patient must first make sure to have activated MyChart and know their login information - If Apple, go to [employer] and type in MyChart in the search bar and download the app. If Android, ask patient to go to [employer] and type in [place] in the search bar and download the app. The app is free but as with any other app downloads, their phone may require them to verify saved payment information or Apple/Android password.   - The patient will need to then log into the app with their MyChart username and password, and select [facility] as their healthcare provider to link the account. When it is time for your visit, go to the MyChart app, find appointments, and click Begin Video Visit. Be sure to Select Allow for your device to access the Microphone and Camera for your visit. You will then be connected, and your provider will be with you shortly.  **If they have any issues connecting, or need assistance please contact MyChart service desk [phone])**  **If using a computer, in order to ensure the best quality for their visit they will need to use either of the following Internet Browsers: [employer], or Google Chrome**  IF USING DOXIMITY or DOXY.ME - The patient will receive a link just prior to their visit by text.     FULL LENGTH CONSENT FOR TELE-HEALTH VISIT   I hereby voluntarily request, consent and authorize [facility] and its employed or contracted physicians, physician assistants, nurse practitioners or other licensed health care professionals (the Practitioner), to provide me with telemedicine health care services (the Services") as deemed necessary by the treating Practitioner. I acknowledge and consent to receive the Services by the Practitioner via telemedicine. I understand that the telemedicine visit will involve communicating with the Practitioner through live audiovisual communication technology and the disclosure of certain medical information by electronic transmission. I acknowledge that I have been given the opportunity to request an in-person assessment or other available alternative prior to the telemedicine visit and am voluntarily participating in the telemedicine visit.  I understand that I have the right to withhold or withdraw my consent to the use of telemedicine in the course of my care at any time, without affecting my right to future care or treatment, and that  the Practitioner or I may terminate the telemedicine visit at any time. I understand that I have the right to inspect all information obtained and/or recorded in the course of the telemedicine visit and may receive copies of available information for a reasonable fee.  I understand that some of the potential risks of receiving the Services via telemedicine include:   Delay or interruption in medical evaluation due to technological equipment failure or disruption;  Information transmitted may not be sufficient (e.g. poor resolution of images) to allow for appropriate medical decision making by the Practitioner; and/or   In rare instances, security protocols could fail, causing a breach of personal health information.  Furthermore, I acknowledge that it is my responsibility to provide information about my medical history, conditions and care that is complete and accurate to the best of my ability. I acknowledge that Practitioner's advice, recommendations, and/or decision may be based on factors not within their control, such as incomplete or inaccurate data provided by me or distortions of diagnostic images or specimens that may result from electronic transmissions. I understand that the practice  of medicine is not an Chief Strategy Officer and that Practitioner makes no warranties or guarantees regarding treatment outcomes. I acknowledge that I will receive a copy of this consent concurrently upon execution via email to the email address I last provided but may also request a printed copy by calling the office of [facility].    I understand that my insurance will be billed for this visit.   I have read or had this consent read to me.  I understand the contents of this consent, which adequately explains the benefits and risks of the Services being provided via telemedicine.   I have been provided ample opportunity to ask questions regarding this consent and the Services and have had my questions answered to  my satisfaction.  I give my informed consent for the services to be provided through the use of telemedicine in my medical care  By participating in this telemedicine visit I agree to the above.

## 2019-02-27 ENCOUNTER — Encounter: Payer: Self-pay | Admitting: Cardiology

## 2019-02-27 ENCOUNTER — Encounter: Payer: Self-pay | Admitting: *Deleted

## 2019-02-27 ENCOUNTER — Telehealth (INDEPENDENT_AMBULATORY_CARE_PROVIDER_SITE_OTHER): Payer: Medicare Other | Admitting: Cardiology

## 2019-02-27 VITALS — BP 128/77 | HR 72 | Ht 66.5 in | Wt 170.0 lb

## 2019-02-27 DIAGNOSIS — I351 Nonrheumatic aortic (valve) insufficiency: Secondary | ICD-10-CM

## 2019-02-27 DIAGNOSIS — I482 Chronic atrial fibrillation, unspecified: Secondary | ICD-10-CM | POA: Diagnosis not present

## 2019-02-27 DIAGNOSIS — I5032 Chronic diastolic (congestive) heart failure: Secondary | ICD-10-CM

## 2019-02-27 DIAGNOSIS — E782 Mixed hyperlipidemia: Secondary | ICD-10-CM

## 2019-02-27 DIAGNOSIS — I2510 Atherosclerotic heart disease of native coronary artery without angina pectoris: Secondary | ICD-10-CM

## 2019-02-27 NOTE — Progress Notes (Signed)
Virtual Visit via Telephone Note   This visit type was conducted due to national recommendations for restrictions regarding the COVID-19 Pandemic (e.g. social distancing) in an effort to limit this patient's exposure and mitigate transmission in our community.  Due to his co-morbid illnesses, this patient is at least at moderate risk for complications without adequate follow up.  This format is felt to be most appropriate for this patient at this time.  The patient did not have access to video technology/had technical difficulties with video requiring transitioning to audio format only (telephone).  All issues noted in this document were discussed and addressed.  No physical exam could be performed with this format.  Please refer to the patient's chart for his  consent to telehealth for [name].   Date:  02/27/2019   ID:  [name], DOB [birth year], MRN [id number]  Patient Location: Home Provider Location: Office  PCP:  Loman Brooklyn, FNP  Cardiologist:  Carlyle Dolly, MD  Electrophysiologist:  None   Evaluation Performed:  Follow-Up Visit  Chief Complaint:  Follow up visit  History of Present Illness:    [name] is a 78 y.o. male seen today for follow up of the following medical problems.   1. Chronic afib - DOACs were too expensive in the past, he has elected to remain on coumadin. - recent admission with a febrile illness, rigors at [facility]. Caused his afib rates to elevate in the hospital, lopressor was increased to 100mg  bid. Took a few days after discharge, but starting causing significant fatigue. - he lowered lopressor back to 50mg  bid. Symptoms have improved.  - seen by EP, did not meet criteria to consider watchman device    - no recent palpitations - compliant with meds. No bleeding on coumadin.    2. Carotid stenosis - 02/2015 carotid US: bilateral 1-39% ICA disease.  3. Hyperlipidemia - joint pains on simvaststin,  atorvastatin, rosuvastatin, pravastatin. - he is compliant with zetia - reports he has upcoming labs with pcp   4. CAD - mild disease by cath 2009   ]- no recent chest pain  5. LE edema - reports singificant bilateral leg edema over the last few weeks. Denies any SOB or orthopnea. Weight is up 6 lbs since 04/2018.  - Jan 2020 LVEF 60-65%, cannot eval diastolic function due to afib but sever biatrial enlargements is suggestive.   - no recent edema. Limiting sodium intake.Has not required prn lasix    6. Aortic regurgitation - moderate AI by echo Jan 2020 - no recent symptoms    SH: retired Forensic psychologist. 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 [place]  The patient does not have symptoms concerning for COVID-19 infection (fever, chills, cough, or new shortness of breath).    Past Medical History:  Diagnosis Date  . Anxiety disorder   . Benign prostatic hypertrophy   . Bilateral cataracts   . CAD (coronary artery disease)    Catheterization 2009, mild CAD  . Carotid artery disease ([place])    Doppler, September, 2011, less than 50% bilateral  . CVA (cerebral infarction)    Hospitalization.  MRI...    Left anterior cerebral artery territory, September, 2011... Coumadin started  . DYSLIPIDEMIA 11/01/2009   Qualifier: Diagnosis of  By: Ron Parker, MD, Leonidas Romberg Dorinda Hill   . Ejection fraction    EF 60%  . Ejection fraction    EF 55-60%, echo, September, 2011  . Gastroesophageal reflux disease   .  Hyperlipidemia   . Persistent atrial fibrillation       . Ptosis of eyelid, left    Surgery in the past  . Sweating abnormality    Episodes of sweating appeared to be related to a delayed reaction to niacin   Past Surgical History:  Procedure Laterality Date  . APPENDECTOMY    . TRANSURETHRAL RESECTION OF PROSTATE N/A 08/19/2018   Procedure: TRANSURETHRAL RESECTION OF THE PROSTATE (TURP) OF PROSTATE ABCESS;  Surgeon: Alexis Frock, MD;  Location: [facility];   Service: Urology;  Laterality: N/A;     Current Meds  Medication Sig  . acetaminophen (TYLENOL) 500 MG tablet Take 1,000 mg by mouth daily as needed.  Marland Kitchen albuterol (PROAIR HFA) 108 (90 BASE) MCG/ACT inhaler Inhale 2 puffs into the lungs every 4 (four) hours as needed for wheezing. 2 puffs every 4 hours as needed only  if your can't catch your breath  . ezetimibe (ZETIA) 10 MG tablet TAKE 1 TABLET EVERY DAY (DISCONTINUE PRAVASTATIN)  . furosemide (LASIX) 40 MG tablet Take one tab daily x 3 days, then only as needed thereafter for swelling.  . metoprolol tartrate (LOPRESSOR) 25 MG tablet TAKE 1 AND 1/2 TABLETS TWICE DAILY ( NEW DOSE )  . SYMBICORT 160-4.5 MCG/ACT inhaler INHALE 2 PUFFS INTO THE LUNGS EVERY MORNING -APPOINTMENT WITH DR Melvyn Novas NEEDED FOR FURTHER REFILLS (Patient taking differently: Inhale 2 puffs into the lungs daily. )  . tamsulosin (FLOMAX) 0.4 MG CAPS capsule Take 0.4 mg by mouth daily.  . traZODone (DESYREL) 50 MG tablet Take 50-100 mg by mouth at bedtime as needed for sleep.   Marland Kitchen warfarin (COUMADIN) 5 MG tablet TAKE 1 TABLET DAILY EXCEPT 1/2 TABLET ON TUESDAYS AND FRIDAYS OR AS DIRECTD     Allergies:   Asa [aspirin] and Niacin   Social History   Tobacco Use  . Smoking status: Former Smoker    Packs/day: 3.00    Years: 50.00    Pack years: 150.00    Types: Cigarettes    Quit date: 03/18/1990    Years since quitting: 28.9  . Smokeless tobacco: Former Systems developer    Types: Burt date: 06/18/2000  Substance Use Topics  . Alcohol use: No    Alcohol/week: 0.0 standard drinks  . Drug use: No     Family Hx: The patient's family history includes Other in an other family member.  ROS:   Please see the history of present illness.     All other systems reviewed and are negative.   Prior CV studies:   The following studies were reviewed today:  02/2015 carotid US: bilateral 1-39% ICA disease. Recs for f/u 2 years  02/2008 cath FINDINGS: The aortic pressure 98/71 with  a mean of 83, left ventricular pressure 99/20.  Coronary angiography. The left mainstem is very short. There is no stenosis present.  The LAD is a large-caliber vessel that courses down and reaches the LV apex. The anatomy is a bit unusual as there is a Sharayah Renfrow off the proximal LAD that runs the course of an intermediate. It supplies multiple Eleena Grater vessels to the lateral wall. There has been more of a traditional appearing diagonal Tyrian Peart from further down in the mid-LAD. The LAD at the origin of the second diagonal Tarnesha Ulloa has a 30% stenosis. Further down in the vessel, there is minor nonobstructive plaque but no significant stenosis in the LAD or its Marckus Hanover vessels. There are two diagonals in the intermediate territory Mamoudou Mulvehill but  all arising from the proximal and mid LAD.  The left circumflex is very small. It courses down the AV groove and supplies no significant Gene Glazebrook vessels.  Right coronary artery. The right coronary artery is dominant. Proximally, there is an eccentric 50% stenosis. This is a smooth appearing lesion of the vessels. The midportion of the right coronary artery is dilated. There is a transition zone into more normal- appearing vessel where there is a 30% stenosis. There is a large acute marginal Ayn Domangue and a moderate size PDA and posterolateral Afiya Ferrebee. There are no other significant stenoses noted. With intracoronary nitroglycerin, the lesion still appears no worse than 50%.  Left ventriculography shows low normal LV function with an LVEF in the range of 50-55%. There was no significant mitral regurgitation.  ASSESSMENT: 1. Mild-to-moderate right coronary artery stenosis as outlined above. 2. Minimal left anterior descending and left circumflex stenosis. 3. Normal left ventricular function.  Post cat PLAN: Recommend medical therapy for nonobstructive CAD. The patient will resume Coumadin tonight. He will  drop his aspirin dose to 81 mg. In reviewing Dr. Ron Parker notes, the patient may not require long-term Coumadin. Recommend aggressive secondary risk reduction for the patient's diffuse nonobstructive CAD.   08/2016 event monitor  Telemetry tracings show rate controlled atrial fibrillation  No symptoms reported  Labs/Other Tests and Data Reviewed:    EKG:  No ECG reviewed.  Recent Labs: 08/20/2018: BUN 23; Creatinine, Ser 0.89; Potassium 4.4; Sodium 138 08/21/2018: Hemoglobin 12.8; Platelets 196   Recent Lipid Panel No results found for: CHOL, TRIG, HDL, CHOLHDL, LDLCALC, LDLDIRECT  Wt Readings from Last 3 Encounters:  02/27/19 170 lb (77.1 kg)  08/18/18 167 lb 1.7 oz (75.8 kg)  08/11/18 174 lb (78.9 kg)     Objective:    Vital Signs:  BP 128/77   Pulse 72   Ht 5' 6.5" (1.689 m)   Wt 170 lb (77.1 kg)   BMI 27.03 kg/m    Normal affect. Normal speech pattern and tone. Comfortable, no apparent distress. No audible signs of SOB or wheezing.   ASSESSMENT & PLAN:    1. Chronic afib - DOACs have been too expensive, he prefers staying on coumadin - no recent symptoms, continue current meds  2. CAD - mild disease on prior cath - denies any symptoms, continue curren tmeds  3. HYperlipidemia -intolerant to multiple statins, continue zetia - request pcp labs   4. LE edema/chronic diatolic HF.  - doing well, continue prn lasix  5. Aortic regurgitation - moderate by Jan 2020 echo, continue to monitor, likely repeat 2-3 years.     F/u 6 months  COVID-19 Education: The signs and symptoms of COVID-19 were discussed with the patient and how to seek care for testing (follow up with PCP or arrange E-visit).  The importance of social distancing was discussed today.  Time:   Today, I have spent 19 minutes with the patient with telehealth technology discussing the above problems.     Medication Adjustments/Labs and Tests Ordered: Current medicines are reviewed at  length with the patient today.  Concerns regarding medicines are outlined above.   Tests Ordered: No orders of the defined types were placed in this encounter.   Medication Changes: No orders of the defined types were placed in this encounter.   Follow Up:  In Person in 6 month(s)  Signed, Carlyle Dolly, MD  02/27/2019 8:10 AM    [place]

## 2019-02-27 NOTE — Patient Instructions (Signed)

## 2019-03-17 ENCOUNTER — Other Ambulatory Visit: Payer: Self-pay

## 2019-03-17 ENCOUNTER — Ambulatory Visit (INDEPENDENT_AMBULATORY_CARE_PROVIDER_SITE_OTHER): Payer: Medicare Other | Admitting: Pharmacist

## 2019-03-17 DIAGNOSIS — I482 Chronic atrial fibrillation, unspecified: Secondary | ICD-10-CM

## 2019-03-17 DIAGNOSIS — Z5181 Encounter for therapeutic drug level monitoring: Secondary | ICD-10-CM

## 2019-03-17 LAB — POCT INR: INR: 3 (ref 2.0–3.0)

## 2019-03-17 NOTE — Patient Instructions (Signed)
Description   Continue coumadin 1 tablet daily except 1/2 tablet on Tuesdays Recheck in 6 weeks

## 2019-03-19 ENCOUNTER — Other Ambulatory Visit: Payer: Self-pay

## 2019-03-20 ENCOUNTER — Ambulatory Visit (INDEPENDENT_AMBULATORY_CARE_PROVIDER_SITE_OTHER): Payer: Medicare Other | Admitting: Family Medicine

## 2019-03-20 ENCOUNTER — Encounter: Payer: Self-pay | Admitting: Family Medicine

## 2019-03-20 VITALS — BP 138/82 | HR 69 | Temp 98.0°F | Resp 20 | Ht 66.5 in | Wt 172.0 lb

## 2019-03-20 DIAGNOSIS — E782 Mixed hyperlipidemia: Secondary | ICD-10-CM | POA: Diagnosis not present

## 2019-03-20 DIAGNOSIS — I4891 Unspecified atrial fibrillation: Secondary | ICD-10-CM

## 2019-03-20 DIAGNOSIS — N182 Chronic kidney disease, stage 2 (mild): Secondary | ICD-10-CM

## 2019-03-20 DIAGNOSIS — M2560 Stiffness of unspecified joint, not elsewhere classified: Secondary | ICD-10-CM | POA: Diagnosis not present

## 2019-03-20 DIAGNOSIS — R42 Dizziness and giddiness: Secondary | ICD-10-CM

## 2019-03-20 DIAGNOSIS — K219 Gastro-esophageal reflux disease without esophagitis: Secondary | ICD-10-CM

## 2019-03-20 DIAGNOSIS — I2510 Atherosclerotic heart disease of native coronary artery without angina pectoris: Secondary | ICD-10-CM

## 2019-03-20 DIAGNOSIS — R7303 Prediabetes: Secondary | ICD-10-CM

## 2019-03-20 LAB — BAYER DCA HB A1C WAIVED: HB A1C (BAYER DCA - WAIVED): 6.2 % (ref <7.0)

## 2019-03-20 MED ORDER — MECLIZINE HCL 25 MG PO TABS: 25.0000 mg | ORAL_TABLET | Freq: Three times a day (TID) | ORAL | 2 refills | Status: DC | PRN

## 2019-03-20 NOTE — Patient Instructions (Signed)
How to Perform the Epley Maneuver The Epley maneuver is an exercise that relieves symptoms of vertigo. Vertigo is the feeling that you or your surroundings are moving when they are not. When you feel vertigo, you may feel like the room is spinning and have trouble walking. Dizziness is a little different than vertigo. When you are dizzy, you may feel unsteady or light-headed. You can do this maneuver at home whenever you have symptoms of vertigo. You can do it up to 3 times a day until your symptoms go away. Even though the Epley maneuver may relieve your vertigo for a few weeks, it is possible that your symptoms will return. This maneuver relieves vertigo, but it does not relieve dizziness. What are the risks? If it is done correctly, the Epley maneuver is considered safe. Sometimes it can lead to dizziness or nausea that goes away after a short time. If you develop other symptoms, such as changes in vision, weakness, or numbness, stop doing the maneuver and call your health care provider. How to perform the Epley maneuver 1. Sit on the edge of a bed or table with your back straight and your legs extended or hanging over the edge of the bed or table. 2. Turn your head halfway toward the affected ear or side. 3. Lie backward quickly with your head turned until you are lying flat on your back. You may want to position a pillow under your shoulders. 4. Hold this position for 30 seconds. You may experience an attack of vertigo. This is normal. 5. Turn your head to the opposite direction until your unaffected ear is facing the floor. 6. Hold this position for 30 seconds. You may experience an attack of vertigo. This is normal. Hold this position until the vertigo stops. 7. Turn your whole body to the same side as your head. Hold for another 30 seconds. 8. Sit back up. You can repeat this exercise up to 3 times a day. Follow these instructions at home:  After doing the Epley maneuver, you can return to  your normal activities.  Ask your health care provider if there is anything you should do at home to prevent vertigo. He or she may recommend that you: ? Keep your head raised (elevated) with two or more pillows while you sleep. ? Do not sleep on the side of your affected ear. ? Get up slowly from bed. ? Avoid sudden movements during the day. ? Avoid extreme head movement, like looking up or bending over. Contact a health care provider if:  Your vertigo gets worse.  You have other symptoms, including: ? Nausea. ? Vomiting. ? Headache. Get help right away if:  You have vision changes.  You have a severe or worsening headache or neck pain.  You cannot stop vomiting.  You have new numbness or weakness in any part of your body. Summary  Vertigo is the feeling that you or your surroundings are moving when they are not.  The Epley maneuver is an exercise that relieves symptoms of vertigo.  If the Epley maneuver is done correctly, it is considered safe. You can do it up to 3 times a day. This information is not intended to replace advice given to you by your health care provider. Make sure you discuss any questions you have with your health care provider. Document Released: 06/09/2013 Document Revised: 05/17/2017 Document Reviewed: 04/24/2016 Elsevier Patient Education  [employer].   Vertigo Vertigo is the feeling that you or the things  around you are moving when they are not. This feeling can come and go at any time. Vertigo often goes away on its own. This condition can be dangerous if it happens when you are doing activities like driving or working with machines. Your doctor will do tests to find the cause of your vertigo. These tests will also help your doctor decide on the best treatment for you. Follow these instructions at home: Eating and drinking      Drink enough fluid to keep your pee (urine) pale yellow.  Do not drink alcohol. Activity  Return to your  normal activities as told by your doctor. Ask your doctor what activities are safe for you.  In the morning, first sit up on the side of the bed. When you feel okay, stand slowly while you hold onto something until you know that your balance is fine.  Move slowly. Avoid sudden body or head movements or certain positions, as told by your doctor.  Use a cane if you have trouble standing or walking.  Sit down right away if you feel dizzy.  Avoid doing any tasks or activities that can cause danger to you or others if you get dizzy.  Avoid bending down if you feel dizzy. Place items in your home so that they are easy for you to reach without leaning over.  Do not drive or use heavy machinery if you feel dizzy. General instructions  Take over-the-counter and prescription medicines only as told by your doctor.  Keep all follow-up visits as told by your doctor. This is important. Contact a doctor if:  Your medicine does not help your vertigo.  You have a fever.  Your problems get worse or you have new symptoms.  Your family or friends see changes in your behavior.  The feeling of being sick to your stomach gets worse.  Your vomiting gets worse.  You lose feeling (have numbness) in part of your body.  You feel prickling and tingling in a part of your body. Get help right away if:  You have trouble moving or talking.  You are always dizzy.  You pass out (faint).  You get very bad headaches.  You feel weak in your hands, arms, or legs.  You have changes in your hearing.  You have changes in how you see (vision).  You get a stiff neck.  Bright light starts to bother you. Summary  Vertigo is the feeling that you or the things around you are moving when they are not.  Your doctor will do tests to find the cause of your vertigo.  You may be told to avoid some tasks, positions, or movements.  Contact a doctor if your medicine is not helping, or if you have a fever, new  symptoms, or a change in behavior.  Get help right away if you get very bad headaches, or if you have changes in how you speak, hear, or see. This information is not intended to replace advice given to you by your health care provider. Make sure you discuss any questions you have with your health care provider. Document Released: 03/13/2008 Document Revised: 04/28/2018 Document Reviewed: 04/28/2018 Elsevier Patient Education  2020 [employer].

## 2019-03-20 NOTE — Progress Notes (Signed)
New Patient Office Visit  Assessment & Plan:  1. Vertigo - Education provided on performing the Epley maneuver at home and vertigo.  - meclizine (ANTIVERT) 25 MG tablet; Take 1 tablet (25 mg total) by mouth 3 (three) times daily as needed for dizziness.  Dispense: 30 tablet; Refill: 2  2. Joint stiffness - Discussed arthritis. Encouraged use of Tylenol.   3. Atrial fibrillation, unspecified type ([place]) - Well controlled on current regimen. Managed by cardiology.  - CBC with Differential/Platelet - CMP14+EGFR  4. Mixed hyperlipidemia - Well controlled on current regimen. Managed by cardiology. Unable to tolerate statins.  - CMP14+EGFR - Lipid panel  5. Coronary artery disease involving native coronary artery of native heart without angina pectoris - Managed by cardiology.  - Lipid panel  6. Gastroesophageal reflux disease, unspecified whether esophagitis present - Well controlled on current regimen.  - CMP14+EGFR  7. Prediabetes - Bayer DCA Hb A1c Waived  8. Chronic kidney disease (CKD), stage II (mild) - CMP14+EGFR   Follow-up: Return in about 6 months (around 09/18/2019) for follow-up of chronic medication conditions.   [name], MSN, APRN, FNP-C [facility]  Subjective:  Patient ID: [name], male    DOB: 24-May-1941  Age: 78 y.o. MRN: [id number]  Patient Care Team: Loman Brooklyn, FNP as PCP - General (Family Medicine) Harl Bowie Alphonse Guild, MD as Consulting Physician (Cardiology) Irine Seal, MD as Attending Physician (Urology)  CC:  Chief Complaint  Patient presents with  . Establish Care    NEW   . Hyperlipidemia  . Atrial Fibrillation  . Coronary Artery Disease  . Gastroesophageal Reflux    HPI AGNES PROBERT presents to establish care. He is transferring from [place] [facility]. Patient also is in need of a refill of meclizine.   Patient reports he has vertigo real bad on and off for the past four weeks.  He has run out of his meclizine. Yesterday he threw up due to the dizziness when he was mowing on his zero turn mower.   Review of Systems  Constitutional: Negative for chills, fever, malaise/fatigue and weight loss.  HENT: Negative for congestion, ear discharge, ear pain, nosebleeds, sinus pain, sore throat and tinnitus.   Eyes: Negative for blurred vision, double vision, pain, discharge and redness.  Respiratory: Negative for cough, shortness of breath and wheezing.   Cardiovascular: Negative for chest pain, palpitations and leg swelling.  Gastrointestinal: Negative for abdominal pain, constipation, diarrhea, heartburn, nausea and vomiting.  Genitourinary: Negative for dysuria, frequency and urgency.  Musculoskeletal: Negative for myalgias.  Skin: Negative for rash.  Neurological: Positive for dizziness. Negative for seizures, weakness and headaches.  Psychiatric/Behavioral: Negative for depression, substance abuse and suicidal ideas. The patient is not nervous/anxious.      Current Outpatient Medications:  .  ezetimibe (ZETIA) 10 MG tablet, TAKE 1 TABLET EVERY DAY (DISCONTINUE PRAVASTATIN), Disp: 90 tablet, Rfl: 3 .  finasteride (PROSCAR) 5 MG tablet, Take 5 mg by mouth daily., Disp: , Rfl:  .  meclizine (ANTIVERT) 25 MG tablet, Take 1 tablet (25 mg total) by mouth 3 (three) times daily as needed for dizziness., Disp: 30 tablet, Rfl: 2 .  metoprolol tartrate (LOPRESSOR) 25 MG tablet, TAKE 1 AND 1/2 TABLETS TWICE DAILY ( NEW DOSE ), Disp: 270 tablet, Rfl: 1 .  naproxen (NAPROSYN) 500 MG tablet, Take 500 mg by mouth 2 (two) times daily as needed., Disp: , Rfl:  .  SYMBICORT 160-4.5 MCG/ACT inhaler,  INHALE 2 PUFFS INTO THE LUNGS EVERY MORNING -APPOINTMENT WITH DR Melvyn Novas NEEDED FOR FURTHER REFILLS (Patient taking differently: Inhale 2 puffs into the lungs daily. ), Disp: 2 Inhaler, Rfl: 0 .  warfarin (COUMADIN) 5 MG tablet, TAKE 1 TABLET DAILY EXCEPT 1/2 TABLET ON TUESDAYS AND FRIDAYS OR AS  DIRECTD, Disp: 90 tablet, Rfl: 3  Allergies  Allergen Reactions  . Asa [Aspirin]     GI upset  . Niacin     REACTION: hot flashes    Past Medical History:  Diagnosis Date  . Anxiety disorder   . Benign prostatic hypertrophy   . Bilateral cataracts   . CAD (coronary artery disease)    Catheterization 2009, mild CAD  . Carotid artery disease ([place])    Doppler, September, 2011, less than 50% bilateral  . COPD (chronic obstructive pulmonary disease) ([place])   . CVA (cerebral infarction)    Hospitalization.  MRI...    Left anterior cerebral artery territory, September, 2011... Coumadin started  . DYSLIPIDEMIA 11/01/2009   Qualifier: Diagnosis of  By: Ron Parker, MD, Leonidas Romberg Dorinda Hill   . Ejection fraction    EF 60%  . Ejection fraction    EF 55-60%, echo, September, 2011  . Gastroesophageal reflux disease   . Hyperlipidemia   . Persistent atrial fibrillation ([place])       . Ptosis of eyelid, left    Surgery in the past  . Sweating abnormality    Episodes of sweating appeared to be related to a delayed reaction to niacin    Past Surgical History:  Procedure Laterality Date  . APPENDECTOMY    . CATARACT EXTRACTION, BILATERAL    . TRANSURETHRAL RESECTION OF PROSTATE N/A 08/19/2018   Procedure: TRANSURETHRAL RESECTION OF THE PROSTATE (TURP) OF PROSTATE ABCESS;  Surgeon: Alexis Frock, MD;  Location: [facility];  Service: Urology;  Laterality: N/A;    Family History  Problem Relation Age of Onset  . Heart attack Mother   . Other Other        Insignificant for premature CAD  . Heart disease Sister   . Hyperlipidemia Sister   . Kidney disease Son   . Heart attack Paternal Grandfather     Social History   Socioeconomic History  . Marital status: Married    Spouse name: shirley  . Number of children: 5  . Years of education: Not on file  . Highest education level: Not on file  Occupational History  . Occupation: Retired English as a second language teacher  . Financial resource  strain: Not on file  . Food insecurity    Worry: Not on file    Inability: Not on file  . Transportation needs    Medical: Not on file    Non-medical: Not on file  Tobacco Use  . Smoking status: Former Smoker    Packs/day: 3.00    Years: 50.00    Pack years: 150.00    Types: Cigarettes    Quit date: 03/18/1990    Years since quitting: 29.0  . Smokeless tobacco: Former Systems developer    Types: [place] date: 06/18/2000  Substance and Sexual Activity  . Alcohol use: No    Alcohol/week: 0.0 standard drinks  . Drug use: No  . Sexual activity: Not on file  Lifestyle  . Physical activity    Days per week: Not on file    Minutes per session: Not on file  . Stress: Not on file  Relationships  . Social connections  Talks on phone: Not on file    Gets together: Not on file    Attends religious service: Not on file    Active member of club or organization: Not on file    Attends meetings of clubs or organizations: Not on file    Relationship status: Not on file  . Intimate partner violence    Fear of current or ex partner: Not on file    Emotionally abused: Not on file    Physically abused: Not on file    Forced sexual activity: Not on file  Other Topics Concern  . Not on file  Social History Narrative   Quit smoking in 1993. No significant alcohol abuse. Retired from trucking.     Objective:   Today's Vitals: BP 138/82 Comment: manual  Pulse 69   Temp 98 F (36.7 C)   Resp 20   Ht 5' 6.5" (1.689 m)   Wt 172 lb (78 kg)   SpO2 98%   BMI 27.35 kg/m   Physical Exam Vitals signs reviewed.  Constitutional:      General: He is not in acute distress.    Appearance: Normal appearance. He is overweight. He is not ill-appearing, toxic-appearing or diaphoretic.  HENT:     Head: Normocephalic and atraumatic.     Right Ear: Tympanic membrane, ear canal and external ear normal. There is no impacted cerumen.     Left Ear: Tympanic membrane, ear canal and external ear normal. There  is no impacted cerumen.     Nose: Nose normal. No congestion or rhinorrhea.     Mouth/Throat:     Mouth: Mucous membranes are moist.     Pharynx: Oropharynx is clear. No oropharyngeal exudate or posterior oropharyngeal erythema.  Eyes:     General: No scleral icterus.       Right eye: No discharge.        Left eye: No discharge.     Conjunctiva/sclera: Conjunctivae normal.     Pupils: Pupils are equal, round, and reactive to light.  Neck:     Musculoskeletal: Normal range of motion and neck supple. No neck rigidity or muscular tenderness.  Cardiovascular:     Rate and Rhythm: Normal rate and regular rhythm.     Heart sounds: Normal heart sounds. No murmur. No friction rub. No gallop.   Pulmonary:     Effort: Pulmonary effort is normal. No respiratory distress.     Breath sounds: Normal breath sounds. No stridor. No wheezing, rhonchi or rales.  Abdominal:     General: Abdomen is flat. Bowel sounds are normal. There is no distension.     Palpations: Abdomen is soft. There is no mass.     Tenderness: There is no abdominal tenderness. There is no guarding or rebound.     Hernia: No hernia is present.  Musculoskeletal: Normal range of motion.     Right lower leg: No edema.     Left lower leg: No edema.  Lymphadenopathy:     Cervical: No cervical adenopathy.  Skin:    General: Skin is warm and dry.     Capillary Refill: Capillary refill takes less than 2 seconds.  Neurological:     General: No focal deficit present.     Mental Status: He is alert and oriented to person, place, and time. Mental status is at baseline.  Psychiatric:        Mood and Affect: Mood normal.        Behavior: Behavior normal.  Thought Content: Thought content normal.        Judgment: Judgment normal.

## 2019-03-21 LAB — CMP14+EGFR
ALT: 9 IU/L (ref 0–44)
AST: 16 IU/L (ref 0–40)
Albumin/Globulin Ratio: 1.8 (ref 1.2–2.2)
Albumin: 4.1 g/dL (ref 3.7–4.7)
Alkaline Phosphatase: 62 IU/L (ref 39–117)
BUN/Creatinine Ratio: 15 (ref 10–24)
BUN: 18 mg/dL (ref 8–27)
Bilirubin Total: 0.5 mg/dL (ref 0.0–1.2)
CO2: 27 mmol/L (ref 20–29)
Calcium: 9.6 mg/dL (ref 8.6–10.2)
Chloride: 104 mmol/L (ref 96–106)
Creatinine, Ser: 1.17 mg/dL (ref 0.76–1.27)
GFR calc Af Amer: 68 mL/min/{1.73_m2} (ref >59)
GFR calc non Af Amer: 59 mL/min/{1.73_m2} — ABNORMAL LOW (ref >59)
Globulin, Total: 2.3 g/dL (ref 1.5–4.5)
Glucose: 108 mg/dL — ABNORMAL HIGH (ref 65–99)
Potassium: 4.4 mmol/L (ref 3.5–5.2)
Sodium: 143 mmol/L (ref 134–144)
Total Protein: 6.4 g/dL (ref 6.0–8.5)

## 2019-03-21 LAB — LIPID PANEL
Chol/HDL Ratio: 6.2 ratio — ABNORMAL HIGH (ref 0.0–5.0)
Cholesterol, Total: 216 mg/dL — ABNORMAL HIGH (ref 100–199)
HDL: 35 mg/dL — ABNORMAL LOW (ref >39)
LDL Chol Calc (NIH): 148 mg/dL — ABNORMAL HIGH (ref 0–99)
Triglycerides: 181 mg/dL — ABNORMAL HIGH (ref 0–149)
VLDL Cholesterol Cal: 33 mg/dL (ref 5–40)

## 2019-03-21 LAB — CBC WITH DIFFERENTIAL/PLATELET
Basophils Absolute: 0 10*3/uL (ref 0.0–0.2)
Basos: 1 %
EOS (ABSOLUTE): 0.1 10*3/uL (ref 0.0–0.4)
Eos: 2 %
Hematocrit: 43.1 % (ref 37.5–51.0)
Hemoglobin: 14.3 g/dL (ref 13.0–17.7)
Immature Grans (Abs): 0 10*3/uL (ref 0.0–0.1)
Immature Granulocytes: 0 %
Lymphocytes Absolute: 1.5 10*3/uL (ref 0.7–3.1)
Lymphs: 25 %
MCH: 29.3 pg (ref 26.6–33.0)
MCHC: 33.2 g/dL (ref 31.5–35.7)
MCV: 88 fL (ref 79–97)
Monocytes Absolute: 0.5 10*3/uL (ref 0.1–0.9)
Monocytes: 8 %
Neutrophils Absolute: 3.9 10*3/uL (ref 1.4–7.0)
Neutrophils: 64 %
Platelets: 198 10*3/uL (ref 150–450)
RBC: 4.88 x10E6/uL (ref 4.14–5.80)
RDW: 13.2 % (ref 11.6–15.4)
WBC: 6 10*3/uL (ref 3.4–10.8)

## 2019-04-06 ENCOUNTER — Telehealth: Payer: Self-pay | Admitting: Family Medicine

## 2019-04-06 NOTE — Telephone Encounter (Signed)
appt made for 04/07/19

## 2019-04-07 ENCOUNTER — Encounter: Payer: Self-pay | Admitting: Family Medicine

## 2019-04-07 ENCOUNTER — Other Ambulatory Visit: Payer: Self-pay

## 2019-04-07 ENCOUNTER — Ambulatory Visit (INDEPENDENT_AMBULATORY_CARE_PROVIDER_SITE_OTHER): Payer: Medicare Other | Admitting: Family Medicine

## 2019-04-07 VITALS — BP 138/74 | HR 79 | Temp 97.7°F | Ht 66.5 in | Wt 171.2 lb

## 2019-04-07 DIAGNOSIS — R42 Dizziness and giddiness: Secondary | ICD-10-CM

## 2019-04-07 DIAGNOSIS — N1831 Chronic kidney disease, stage 3a: Secondary | ICD-10-CM

## 2019-04-07 DIAGNOSIS — R5383 Other fatigue: Secondary | ICD-10-CM

## 2019-04-07 MED ORDER — DIAZEPAM 2 MG PO TABS: 2.0000 mg | ORAL_TABLET | Freq: Three times a day (TID) | ORAL | 0 refills | Status: DC | PRN

## 2019-04-07 NOTE — Progress Notes (Signed)
Assessment & Plan:  1. Vertigo - Referral placed STAT due to symptoms being persistent x6 weeks with vomiting and difficulty walking. - Ambulatory referral to Neurology - diazepam (VALIUM) 2 MG tablet; Take 1 tablet (2 mg total) by mouth every 8 (eight) hours as needed (dizziness).  Dispense: 30 tablet; Refill: 0  2. Fatigue, unspecified type - TSH - VITAMIN D 25 Hydroxy (Vit-D Deficiency, Fractures)  3. Stage 3a chronic kidney disease - VITAMIN D 25 Hydroxy (Vit-D Deficiency, Fractures)   Follow up plan: Return if symptoms worsen or fail to improve.  [name], MSN, APRN, FNP-C [facility]  Subjective:   Patient ID: [name], male    DOB: May 26, 1941, 78 y.o.   MRN: [id number]  HPI: [name] is a 78 y.o. male presenting on 04/07/2019 for Dizziness (Patient states it has not goten better sincr he was seen on 10/2)  Vertigo Patient presents for evaluation of dizziness. The symptoms started 6 weeks ago and have progressed to a point and plateaued. The attacks occur daily and is continuous. He is throwing up he is so dizzy. He drags his feet as he cannot pick them up to walk due to being afraid he is going to fall over. Positions that worsen symptoms: any motion, bending over, lying down, rolling in bed to the right, standing up and turning head. Previous workup/treatments: none. Associated ear symptoms: none. Associated CNS symptoms: none. Recent infections: none. Head trauma: denied. Drug ingestion: none. Noise exposure: no occupational exposure.   ROS: Negative unless specifically indicated above in HPI.   Relevant past medical history reviewed and updated as indicated.   Allergies and medications reviewed and updated.   Current Outpatient Medications:  .  ezetimibe (ZETIA) 10 MG tablet, TAKE 1 TABLET EVERY DAY (DISCONTINUE PRAVASTATIN), Disp: 90 tablet, Rfl: 3 .  finasteride (PROSCAR) 5 MG tablet, Take 5 mg by mouth daily., Disp: ,  Rfl:  .  meclizine (ANTIVERT) 25 MG tablet, Take 1 tablet (25 mg total) by mouth 3 (three) times daily as needed for dizziness., Disp: 30 tablet, Rfl: 2 .  metoprolol tartrate (LOPRESSOR) 25 MG tablet, TAKE 1 AND 1/2 TABLETS TWICE DAILY ( NEW DOSE ), Disp: 270 tablet, Rfl: 1 .  naproxen (NAPROSYN) 500 MG tablet, Take 500 mg by mouth 2 (two) times daily as needed., Disp: , Rfl:  .  SYMBICORT 160-4.5 MCG/ACT inhaler, INHALE 2 PUFFS INTO THE LUNGS EVERY MORNING -APPOINTMENT WITH DR Melvyn Novas NEEDED FOR FURTHER REFILLS (Patient taking differently: Inhale 2 puffs into the lungs daily. ), Disp: 2 Inhaler, Rfl: 0 .  warfarin (COUMADIN) 5 MG tablet, TAKE 1 TABLET DAILY EXCEPT 1/2 TABLET ON TUESDAYS AND FRIDAYS OR AS DIRECTD, Disp: 90 tablet, Rfl: 3 .  diazepam (VALIUM) 2 MG tablet, Take 1 tablet (2 mg total) by mouth every 8 (eight) hours as needed (dizziness)., Disp: 30 tablet, Rfl: 0  Allergies  Allergen Reactions  . Asa [Aspirin]     GI upset  . Niacin     REACTION: hot flashes    Objective:   BP 138/74   Pulse 79   Temp 97.7 F (36.5 C) (Temporal)   Ht 5' 6.5" (1.689 m)   Wt 171 lb 3.2 oz (77.7 kg)   SpO2 94%   BMI 27.22 kg/m    Physical Exam Vitals signs reviewed.  Constitutional:      General: He is not in acute distress.    Appearance: Normal appearance. He is overweight.  He is not ill-appearing, toxic-appearing or diaphoretic.  HENT:     Head: Normocephalic and atraumatic.  Eyes:     General: No scleral icterus.       Right eye: No discharge.        Left eye: No discharge.     Conjunctiva/sclera: Conjunctivae normal.  Neck:     Musculoskeletal: Normal range of motion.  Cardiovascular:     Rate and Rhythm: Normal rate and regular rhythm.     Heart sounds: Normal heart sounds. No murmur. No friction rub. No gallop.   Pulmonary:     Effort: Pulmonary effort is normal. No respiratory distress.     Breath sounds: Normal breath sounds. No stridor. No wheezing, rhonchi or rales.   Musculoskeletal: Normal range of motion.  Skin:    General: Skin is warm and dry.  Neurological:     Mental Status: He is alert and oriented to person, place, and time. Mental status is at baseline.  Psychiatric:        Mood and Affect: Mood normal.        Behavior: Behavior normal.        Thought Content: Thought content normal.        Judgment: Judgment normal.

## 2019-04-07 NOTE — Patient Instructions (Signed)

## 2019-04-08 ENCOUNTER — Encounter: Payer: Self-pay | Admitting: Family Medicine

## 2019-04-08 DIAGNOSIS — E559 Vitamin D deficiency, unspecified: Secondary | ICD-10-CM | POA: Insufficient documentation

## 2019-04-08 LAB — TSH: TSH: 4.36 u[IU]/mL (ref 0.450–4.500)

## 2019-04-08 LAB — VITAMIN D 25 HYDROXY (VIT D DEFICIENCY, FRACTURES): Vit D, 25-Hydroxy: 20.4 ng/mL — ABNORMAL LOW (ref 30.0–100.0)

## 2019-04-09 ENCOUNTER — Telehealth: Payer: Self-pay | Admitting: Neurology

## 2019-04-09 ENCOUNTER — Other Ambulatory Visit: Payer: Self-pay

## 2019-04-09 ENCOUNTER — Ambulatory Visit (INDEPENDENT_AMBULATORY_CARE_PROVIDER_SITE_OTHER): Payer: Medicare Other | Admitting: Neurology

## 2019-04-09 ENCOUNTER — Encounter: Payer: Self-pay | Admitting: Neurology

## 2019-04-09 VITALS — BP 155/79 | HR 70 | Ht 66.0 in | Wt 170.0 lb

## 2019-04-09 DIAGNOSIS — I2510 Atherosclerotic heart disease of native coronary artery without angina pectoris: Secondary | ICD-10-CM | POA: Diagnosis not present

## 2019-04-09 DIAGNOSIS — R42 Dizziness and giddiness: Secondary | ICD-10-CM | POA: Diagnosis not present

## 2019-04-09 DIAGNOSIS — R269 Unspecified abnormalities of gait and mobility: Secondary | ICD-10-CM

## 2019-04-09 DIAGNOSIS — H81399 Other peripheral vertigo, unspecified ear: Secondary | ICD-10-CM | POA: Diagnosis not present

## 2019-04-09 NOTE — Telephone Encounter (Signed)
Medicare/bankers of life order sent to [facility]. No auth they will reach out to the pt to schedule.  °

## 2019-04-09 NOTE — Patient Instructions (Signed)
It is possible that you have vertigo, it is unusual to suffer from vertigo this long.  You have several risk factors for stroke and even had a stroke in the distant past.  I would like to do a brain MRI without contrast.  We will call you to schedule this.  We need insurance authorization and will take care of this, then call you to schedule.  We will call you with the results.  We may consider physical therapy For what is called vestibular rehab, to help with vertigo. Please stay well-hydrated with water and change positions slowly, get your bearings first, make enough light at night to see and consider using a cane for safety.

## 2019-04-09 NOTE — Progress Notes (Signed)
Subjective:    Patient ID: [name] is a 78 y.o. male.  HPI     Star Age, MD, PhD [facility] [address] [place], [place] [address]   Dear Karsten Fells,   I saw your patient, [name], upon your kind request to my neurologic clinic today for initial consultation of his vertigo.  The patient is accompanied by his wife today.  As you know, [name] is an 78 year old right-handed gentleman with an underlying complex medical history of coronary artery disease, A. fib, hyperlipidemia, prior history of stroke in 2011, COPD, carotid artery disease, vitamin D deficiency, reflux disease, anxiety and mildly overweight state, who reports an approximately 5 to 6-week history of recurrent vertigo.  He feels like the floor is swaying, sometimes the room is spinning, occurs primarily with sudden change in his position but does not happen every day.  He has not had any improvement consistently, he has tried exercises at home to help with peripheral vertigo.  Started fairly suddenly in the morning.  He denies any one-sided weakness or numbness or tingling or droopy face or slurring of speech.  Back in February he had an episode of generalized weakness.  This was a day after he was working in the yard and they had cut down a tree and he was working with his brother-in-law.  He is married and lives with his wife, retired at age 1.  He quit smoking in 1991 and smoked for many years.  [place].  He drinks caffeine in the form of coffee, 2 cups/day, no alcohol currently.  He denies recurrent headaches.  He sleeps reasonably well but has never had a sleep study.  He does snore.  Bedtime is between 9 and 930, rise time between 6 and 730.  He has nocturia once per average night.  He has not had an eye examination in over 2 years. I reviewed your office note from 04/07/2019.He had an episode of vertigo some 20 years ago with symptoms for about 3 weeks at  the time.  He had some recent nausea and vomiting, in the past 3 weeks he has vomited about 3 times.  His Past Medical History Is Significant For: Past Medical History:  Diagnosis Date  . Anxiety disorder   . Benign prostatic hypertrophy   . Bilateral cataracts   . CAD (coronary artery disease)    Catheterization 2009, mild CAD  . Carotid artery disease ([place])    Doppler, September, 2011, less than 50% bilateral  . COPD (chronic obstructive pulmonary disease) ([place])   . CVA (cerebral infarction)    Hospitalization.  MRI...    Left anterior cerebral artery territory, September, 2011... Coumadin started  . DYSLIPIDEMIA 11/01/2009   Qualifier: Diagnosis of  By: Ron Parker, MD, Leonidas Romberg Dorinda Hill   . Ejection fraction    EF 60%  . Ejection fraction    EF 55-60%, echo, September, 2011  . Gastroesophageal reflux disease   . Hyperlipidemia   . Persistent atrial fibrillation ([place])       . Ptosis of eyelid, left    Surgery in the past  . Sweating abnormality    Episodes of sweating appeared to be related to a delayed reaction to niacin  . Vitamin D insufficiency     His Past Surgical History Is Significant For: Past Surgical History:  Procedure Laterality Date  . APPENDECTOMY    . CATARACT EXTRACTION, BILATERAL    .  TRANSURETHRAL RESECTION OF PROSTATE N/A 08/19/2018   Procedure: TRANSURETHRAL RESECTION OF THE PROSTATE (TURP) OF PROSTATE ABCESS;  Surgeon: Alexis Frock, MD;  Location: [facility];  Service: Urology;  Laterality: N/A;    His Family History Is Significant For: Family History  Problem Relation Age of Onset  . Heart attack Mother   . Other Other        Insignificant for premature CAD  . Heart disease Sister   . Hyperlipidemia Sister   . Kidney disease Son   . Heart attack Paternal Grandfather     His Social History Is Significant For: Social History   Socioeconomic History  . Marital status: Married    Spouse name: shirley  . Number of children: 5  . Years of  education: Not on file  . Highest education level: Not on file  Occupational History  . Occupation: Retired English as a second language teacher  . Financial resource strain: Not on file  . Food insecurity    Worry: Not on file    Inability: Not on file  . Transportation needs    Medical: Not on file    Non-medical: Not on file  Tobacco Use  . Smoking status: Former Smoker    Packs/day: 3.00    Years: 50.00    Pack years: 150.00    Types: Cigarettes    Quit date: 03/18/1990    Years since quitting: 29.0  . Smokeless tobacco: Former Systems developer    Types: [place] date: 06/18/2000  Substance and Sexual Activity  . Alcohol use: No    Alcohol/week: 0.0 standard drinks  . Drug use: No  . Sexual activity: Not on file  Lifestyle  . Physical activity    Days per week: Not on file    Minutes per session: Not on file  . Stress: Not on file  Relationships  . Social Herbalist on phone: Not on file    Gets together: Not on file    Attends religious service: Not on file    Active member of club or organization: Not on file    Attends meetings of clubs or organizations: Not on file    Relationship status: Not on file  Other Topics Concern  . Not on file  Social History Narrative   Quit smoking in 1993. No significant alcohol abuse. Retired from trucking.     His Allergies Are:  Allergies  Allergen Reactions  . Asa [Aspirin]     GI upset  . Niacin     REACTION: hot flashes  :   His Current Medications Are:  Outpatient Encounter Medications as of 04/09/2019  Medication Sig  . diazepam (VALIUM) 2 MG tablet Take 1 tablet (2 mg total) by mouth every 8 (eight) hours as needed (dizziness).  . ezetimibe (ZETIA) 10 MG tablet TAKE 1 TABLET EVERY DAY (DISCONTINUE PRAVASTATIN)  . finasteride (PROSCAR) 5 MG tablet Take 5 mg by mouth daily.  . meclizine (ANTIVERT) 25 MG tablet Take 1 tablet (25 mg total) by mouth 3 (three) times daily as needed for dizziness.  . metoprolol tartrate  (LOPRESSOR) 25 MG tablet TAKE 1 AND 1/2 TABLETS TWICE DAILY ( NEW DOSE )  . naproxen (NAPROSYN) 500 MG tablet Take 500 mg by mouth 2 (two) times daily as needed.  . SYMBICORT 160-4.5 MCG/ACT inhaler INHALE 2 PUFFS INTO THE LUNGS EVERY MORNING -APPOINTMENT WITH DR Melvyn Novas NEEDED FOR FURTHER REFILLS (Patient taking differently: Inhale 2 puffs into the  lungs daily. )  . VITAMIN D PO Take 2,000 Units by mouth daily.  Marland Kitchen warfarin (COUMADIN) 5 MG tablet TAKE 1 TABLET DAILY EXCEPT 1/2 TABLET ON TUESDAYS AND FRIDAYS OR AS DIRECTD   No facility-administered encounter medications on file as of 04/09/2019.   :   Review of Systems:  Out of a complete 14 point review of systems, all are reviewed and negative with the exception of these symptoms as listed below:  Review of Systems  Neurological:       Pt presents today to discuss his vertigo. Pt has had vertigo for the past 5 weeks.    Objective:  Neurological Exam  Physical Exam Physical Examination:   Vitals:   04/09/19 1337  BP: (!) 155/79  Pulse: 70   On orthostatic testing, lying blood pressure and pulse is 147/91 with a pulse of 70, sitting 155/79 with a pulse of 90, standing 137/84 with a pulse of 92.  He does not have any orthostatic lightheadedness and denies any vertiginous symptoms.  General Examination: The patient is a very pleasant 78 y.o. male in no acute distress. He appears well-developed and well-nourished and well groomed.   HEENT: Normocephalic, atraumatic, pupils are equal, round and reactive to light and accommodation. He is status post bilateral cataract repairs, extraocular tracking shows no nystagmus, no saccadic breakdown.  Face is symmetric, no facial weakness, no facial masking, speech is clear without dysarthria.  He has no carotid bruits.  Hearing is grossly intact.  Airway examination reveals mild mouth dryness, tongue protrudes centrally in palate elevates symmetrically, mild airway crowding, full dentures.  Normal  facial sensation.  Chest: Clear to auscultation with Coarse breath sounds but no wheezing.  Heart: S1+S2+0, irregular.   Abdomen: Soft, non-tender and non-distended with normal bowel sounds appreciated on auscultation.  Extremities: There is no pitting edema in the distal lower extremities bilaterally. Pedal pulses are intact.  Skin: Warm and dry with Multiple old appearing bruising in both forearms and dorsi of the hands.  Musculoskeletal: exam reveals Arthritic changes in both hands.   Neurologically:  Mental status: The patient is awake, alert and oriented in all 4 spheres. His immediate and remote memory, attention, language skills and fund of knowledge are appropriate. There is no evidence of aphasia, agnosia, apraxia or anomia. Speech is clear with normal prosody and enunciation. Thought process is linear. Mood is normal and affect is normal.  Cranial nerves II - XII are as described above under HEENT exam. In addition: shoulder shrug is normal with equal shoulder height noted. Motor exam: Normal bulk, strength and tone is noted. There is no drift, resting tremor or rebound. Romberg is not tested secondary to safety concerns as he does not Stand well narrow based. Reflexes are 1+ throughout. Babinski: Toes are flexor bilaterally. Fine motor skills and coordination: intact with normal finger taps, normal hand movements, normal rapid alternating patting, normal foot taps and normal foot agility.  Cerebellar testing: No dysmetria or intention tremor on finger to nose testing. Heel to shin is unremarkable bilaterally. There is no truncal or gait ataxia.  Sensory exam: intact to light touch in the upper and lower extremities.  Gait, station and balance: He stands Slowly and cautiously, stands wide-based, he walks cautiously, slightly wide-based, Turns slowly.   Assessment and Plan:   Assessment and Plan:  In summary, [name] is a very pleasant 78 y.o.-year old male with an  underlying complex medical history of coronary artery disease, A. fib, hyperlipidemia,  prior history of stroke in 2011, COPD, carotid artery disease, vitamin D deficiency, reflux disease, anxiety and mildly overweight state, who Presents for evaluation of his recurrent vertigo.  While he may have recurrent peripheral vertigo, a central cause cannot be excluded.  He has multiple stroke risk factors.  He has no telltale cerebellar signs today on examination the static hypotension, no vertiginous spells, no one-sided neurological findings.  Generally speaking, these are all reassuring.  He does not always hydrate very well with water he admits.  He is encouraged to drink plenty of water, change positions slowly and use a cane for gait safety.  We will consider physical therapy referral for formal vestibular rehab.  I would like to proceed with a brain MRI without contrast at this time.  I talked to him about potentially pursuing a sleep study.  He declines this.He reports sleeping well.  Nevertheless, given his diagnosis of A. fib, I wanted to offer him sleep study testing.  At this juncture, we will await his brain MRI results.  We talked about secondary stroke prevention today as well.  I did not suggest any new medications, he has tried Valium.  He has not found any sustained relief with any recent intervention.  He is advised to follow-up in 3 months, sooner if needed.  I answered all Their questions today and the patient and his wife were in agreement Thank you very much for allowing me to participate in the care of this nice patient. If I can be of any further assistance to you please do not hesitate to call me at [phone].  Sincerely,   Star Age, MD, PhD

## 2019-04-10 ENCOUNTER — Ambulatory Visit (INDEPENDENT_AMBULATORY_CARE_PROVIDER_SITE_OTHER): Payer: Medicare Other | Admitting: *Deleted

## 2019-04-10 DIAGNOSIS — Z0000 Encounter for general adult medical examination without abnormal findings: Secondary | ICD-10-CM | POA: Diagnosis not present

## 2019-04-10 NOTE — Patient Instructions (Signed)
Preventive Care 80 Years and Older, Male Preventive care refers to lifestyle choices and visits with your health care provider that can promote health and wellness. This includes:  A yearly physical exam. This is also called an annual well check.  Regular dental and eye exams.  Immunizations.  Screening for certain conditions.  Healthy lifestyle choices, such as diet and exercise. What can I expect for my preventive care visit? Physical exam Your health care provider will check:  Height and weight. These may be used to calculate body mass index (BMI), which is a measurement that tells if you are at a healthy weight.  Heart rate and blood pressure.  Your skin for abnormal spots. Counseling Your health care provider may ask you questions about:  Alcohol, tobacco, and drug use.  Emotional well-being.  Home and relationship well-being.  Sexual activity.  Eating habits.  History of falls.  Memory and ability to understand (cognition).  Work and work Statistician. What immunizations do I need?  Influenza (flu) vaccine  This is recommended every year. Tetanus, diphtheria, and pertussis (Tdap) vaccine  You may need a Td booster every 10 years. Varicella (chickenpox) vaccine  You may need this vaccine if you have not already been vaccinated. Zoster (shingles) vaccine  You may need this after age 50. Pneumococcal conjugate (PCV13) vaccine  One dose is recommended after age 24. Pneumococcal polysaccharide (PPSV23) vaccine  One dose is recommended after age 33. Measles, mumps, and rubella (MMR) vaccine  You may need at least one dose of MMR if you were born in 1957 or later. You may also need a second dose. Meningococcal conjugate (MenACWY) vaccine  You may need this if you have certain conditions. Hepatitis A vaccine  You may need this if you have certain conditions or if you travel or work in places where you may be exposed to hepatitis A. Hepatitis B vaccine   You may need this if you have certain conditions or if you travel or work in places where you may be exposed to hepatitis B. Haemophilus influenzae type b (Hib) vaccine  You may need this if you have certain conditions. You may receive vaccines as individual doses or as more than one vaccine together in one shot (combination vaccines). Talk with your health care provider about the risks and benefits of combination vaccines. What tests do I need? Blood tests  Lipid and cholesterol levels. These may be checked every 5 years, or more frequently depending on your overall health.  Hepatitis C test.  Hepatitis B test. Screening  Lung cancer screening. You may have this screening every year starting at age 74 if you have a 30-pack-year history of smoking and currently smoke or have quit within the past 15 years.  Colorectal cancer screening. All adults should have this screening starting at age 57 and continuing until age 54. Your health care provider may recommend screening at age 47 if you are at increased risk. You will have tests every 1-10 years, depending on your results and the type of screening test.  Prostate cancer screening. Recommendations will vary depending on your family history and other risks.  Diabetes screening. This is done by checking your blood sugar (glucose) after you have not eaten for a while (fasting). You may have this done every 1-3 years.  Abdominal aortic aneurysm (AAA) screening. You may need this if you are a current or former smoker.  Sexually transmitted disease (STD) testing. Follow these instructions at home: Eating and drinking  Eat  a diet that includes fresh fruits and vegetables, whole grains, lean protein, and low-fat dairy products. Limit your intake of foods with high amounts of sugar, saturated fats, and salt.  Take vitamin and mineral supplements as recommended by your health care provider.  Do not drink alcohol if your health care provider  tells you not to drink.  If you drink alcohol: ? Limit how much you have to 0-2 drinks a day. ? Be aware of how much alcohol is in your drink. In the [place] one drink equals one 12 oz bottle of beer (355 mL), one 5 oz glass of wine (148 mL), or one 1 oz glass of hard liquor (44 mL). Lifestyle  Take daily care of your teeth and gums.  Stay active. Exercise for at least 30 minutes on 5 or more days each week.  Do not use any products that contain nicotine or tobacco, such as cigarettes, e-cigarettes, and chewing tobacco. If you need help quitting, ask your health care provider.  If you are sexually active, practice safe sex. Use a condom or other form of protection to prevent STIs (sexually transmitted infections).  Talk with your health care provider about taking a low-dose aspirin or statin. What's next?  Visit your health care provider once a year for a well check visit.  Ask your health care provider how often you should have your eyes and teeth checked.  Stay up to date on all vaccines. This information is not intended to replace advice given to you by your health care provider. Make sure you discuss any questions you have with your health care provider. Document Released: 07/01/2015 Document Revised: 05/29/2018 Document Reviewed: 05/29/2018 Elsevier Patient Education  2020 [employer]  

## 2019-04-10 NOTE — Progress Notes (Addendum)
MEDICARE ANNUAL WELLNESS VISIT  04/10/2019  Telephone Visit Disclaimer This Medicare AWV was conducted by telephone due to national recommendations for restrictions regarding the COVID-19 Pandemic (e.g. social distancing).  I verified, using two identifiers, that I am speaking with [name] or their authorized healthcare agent. I discussed the limitations, risks, security, and privacy concerns of performing an evaluation and management service by telephone and the potential availability of an in-person appointment in the future. The patient expressed understanding and agreed to proceed.   Subjective:  [name] is a 78 y.o. male patient of Sean Brooklyn, FNP who had a Medicare Annual Wellness Visit today via telephone. 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 he has 5 children. he reports that he is socially active and does interact with friends/family regularly. he is moderately physically active and enjoys cutting wood.  Patient Care Team: Sean Brooklyn, FNP as PCP - General (Family Medicine) Sean Bowie Sean Guild, MD as Consulting Physician (Cardiology) Sean Seal, MD as Attending Physician (Urology)  Advanced Directives 04/10/2019 08/18/2018  Does Patient Have a Medical Advance Directive? Yes No  Type of Paramedic of [place];Living will -  Does patient want to make changes to medical advance directive? No - Patient declined -  Copy of [employer] in Chart? No - copy requested -  Would patient like information on creating a medical advance directive? - No - Patient declined    Hospital Utilization Over the Past 12 Months: # of hospitalizations or ER visits: 0 # of surgeries: 1  Review of Systems    Patient reports that his overall health is unchanged compared to last year.  History obtained from chart review  Patient Reported Readings (BP, Pulse, CBG,  Weight, etc) none  Pain Assessment Pain : No/denies pain     Current Medications & Allergies (verified) Allergies as of 04/10/2019      Reactions   Asa [aspirin]    GI upset   Niacin    REACTION: hot flashes      Medication List       Accurate as of April 10, 2019  9:32 AM. If you have any questions, ask your nurse or doctor.        STOP taking these medications   naproxen 500 MG tablet Commonly known as: NAPROSYN     TAKE these medications   diazepam 2 MG tablet Commonly known as: Valium Take 1 tablet (2 mg total) by mouth every 8 (eight) hours as needed (dizziness).   ezetimibe 10 MG tablet Commonly known as: ZETIA TAKE 1 TABLET EVERY DAY (DISCONTINUE PRAVASTATIN)   finasteride 5 MG tablet Commonly known as: PROSCAR Take 5 mg by mouth daily.   meclizine 25 MG tablet Commonly known as: ANTIVERT Take 1 tablet (25 mg total) by mouth 3 (three) times daily as needed for dizziness.   metoprolol tartrate 25 MG tablet Commonly known as: LOPRESSOR TAKE 1 AND 1/2 TABLETS TWICE DAILY ( NEW DOSE )   Symbicort 160-4.5 MCG/ACT inhaler Generic drug: budesonide-formoterol INHALE 2 PUFFS INTO THE LUNGS EVERY MORNING -APPOINTMENT WITH DR [name] NEEDED FOR FURTHER REFILLS What changed: See the new instructions.   traZODone 50 MG tablet Commonly known as: DESYREL Take 50 mg by mouth at bedtime.   VITAMIN D PO Take 2,000 Units by mouth daily.   warfarin 5 MG tablet Commonly known as: COUMADIN Take  as directed by the anticoagulation clinic. If you are unsure how to take this medication, talk to your nurse or doctor. Original instructions: TAKE 1 TABLET DAILY EXCEPT 1/2 TABLET ON TUESDAYS AND FRIDAYS OR AS DIRECTD       History (reviewed): Past Medical History:  Diagnosis Date  . Anxiety disorder   . Benign prostatic hypertrophy   . Bilateral cataracts   . CAD (coronary artery disease)    Catheterization 2009, mild CAD  . Carotid artery disease ([place])     Doppler, September, 2011, less than 50% bilateral  . COPD (chronic obstructive pulmonary disease) ([place])   . CVA (cerebral infarction)    Hospitalization.  MRI...    Left anterior cerebral artery territory, September, 2011... Coumadin started  . DYSLIPIDEMIA 11/01/2009   Qualifier: Diagnosis of  By: Sean Parker, MD, [name]   . Ejection fraction    EF 60%  . Ejection fraction    EF 55-60%, echo, September, 2011  . Gastroesophageal reflux disease   . Hyperlipidemia   . Persistent atrial fibrillation ([place])       . Ptosis of eyelid, left    Surgery in the past  . Sweating abnormality    Episodes of sweating appeared to be related to a delayed reaction to niacin  . Vitamin D insufficiency    Past Surgical History:  Procedure Laterality Date  . APPENDECTOMY    . CATARACT EXTRACTION, BILATERAL    . KNEE SURGERY Right 2019   torn ligament?   . TRANSURETHRAL RESECTION OF PROSTATE N/A 08/19/2018   Procedure: TRANSURETHRAL RESECTION OF THE PROSTATE (TURP) OF PROSTATE ABCESS;  Surgeon: Sean Frock, MD;  Location: [facility];  Service: Urology;  Laterality: N/A;   Family History  Problem Relation Age of Onset  . Heart attack Mother   . Other Other        Insignificant for premature CAD  . Heart disease Sister   . Hyperlipidemia Sister   . Kidney disease Son   . Heart attack Paternal Grandfather    Social History   Socioeconomic History  . Marital status: Married    Spouse name: [name]  . Number of children: 5  . Years of education: 68  . Highest education level: 11th grade  Occupational History  . Occupation: Retired English as a second language teacher  . Financial resource strain: Not hard at all  . Food insecurity    Worry: Never true    Inability: Never true  . Transportation needs    Medical: No    Non-medical: No  Tobacco Use  . Smoking status: Former Smoker    Packs/day: 3.00    Years: 50.00    Pack years: 150.00    Types: Cigarettes    Quit date: 03/18/1990     Years since quitting: 29.0  . Smokeless tobacco: Former Systems developer    Types: [place] date: 06/18/2000  Substance and Sexual Activity  . Alcohol use: No    Alcohol/week: 0.0 standard drinks  . Drug use: No  . Sexual activity: Not Currently  Lifestyle  . Physical activity    Days per week: 7 days    Minutes per session: 20 min  . Stress: Not at all  Relationships  . Social connections    Talks on phone: More than three times a week    Gets together: More than three times a week    Attends religious service: More than 4 times per year  Active member of club or organization: Yes    Attends meetings of clubs or organizations: More than 4 times per year    Relationship status: Married  Other Topics Concern  . Not on file  Social History Narrative   Quit smoking in 1993. No significant alcohol abuse. Retired from trucking.     Activities of Daily Living In your present state of health, do you have any difficulty performing the following activities: 04/10/2019 08/18/2018  Hearing? N N  Vision? N N  Comment has been a few years since his last eye exam-will schedule exam soon -  Difficulty concentrating or making decisions? Y N  Comment has noticed he has more trouble remembering peoples names -  Walking or climbing stairs? N Y  Dressing or bathing? N Y  Doing errands, shopping? N N  Preparing Food and eating ? N -  Using the Toilet? N -  In the past six months, have you accidently leaked urine? Y -  Comment r/t TURP surgery -  Do you have problems with loss of bowel control? N -  Managing your Medications? N -  Managing your Finances? N -  Housekeeping or managing your Housekeeping? N -  Some recent data might be hidden    Patient Education/ Literacy How often do you need to have someone help you when you read instructions, pamphlets, or other written materials from your doctor or pharmacy?: 1 - Never What is the last grade level you completed in school?: 11th grade  Exercise  Current Exercise Habits: Home exercise routine, Type of exercise: walking, Time (Minutes): 20, Frequency (Times/Week): 7, Weekly Exercise (Minutes/Week): 140, Intensity: Mild, Exercise limited by: cardiac condition(s);Other - see comments;respiratory conditions(s)(vertigo)  Diet Patient reports consuming 3 meals a day and 0 snack(s) a day Patient reports that his primary diet is: Regular Patient reports that she does have regular access to food.   Depression Screen PHQ 2/9 Scores 04/10/2019 04/07/2019 03/20/2019 08/18/2018  PHQ - 2 Score 0 0 0 0     Fall Risk Fall Risk  04/10/2019 04/07/2019 03/20/2019 08/18/2018  Falls in the past year? 0 0 0 0  Number falls in past yr: 0 - - -  Injury with Fall? 0 - - -  Risk for fall due to : - - - Impaired balance/gait;Medication side effect  Follow up Falls prevention discussed - - Falls evaluation completed  Comment Get rid of all throw rugs in the house, adequate lighting in the walkways and grab bars in the bathroom - - -     Objective:  [name] seemed alert and oriented and he participated appropriately during our telephone visit.  Blood Pressure Weight BMI  BP Readings from Last 3 Encounters:  04/09/19 (!) 155/79  04/07/19 138/74  03/20/19 138/82   Wt Readings from Last 3 Encounters:  04/09/19 170 lb (77.1 kg)  04/07/19 171 lb 3.2 oz (77.7 kg)  03/20/19 172 lb (78 kg)   BMI Readings from Last 1 Encounters:  04/09/19 27.44 kg/m    *Unable to obtain current vital signs, weight, and BMI due to telephone visit type  Hearing/Vision  . [place] did not seem to have difficulty with hearing/understanding during the telephone conversation . Reports that he has not had a formal eye exam by an eye care professional within the past year . Reports that he has not had a formal hearing evaluation within the past year *Unable to fully assess hearing and vision during telephone visit type  Cognitive Function: 6CIT Screen 04/10/2019  What  Year? 0 points  What month? 0 points  What time? 0 points  Count back from 20 0 points  Months in reverse 0 points  Repeat phrase 2 points  Total Score 2   (Normal:0-7, Significant for Dysfunction: >8)  Normal Cognitive Function Screening: Yes   Immunization & Health Maintenance Record Immunization History  Administered Date(s) Administered  . Influenza Whole 03/18/2012  . Influenza, High Dose Seasonal PF 03/17/2019  . Influenza,inj,Quad PF,6+ Mos 04/02/2017, 03/17/2018  . Influenza-Unspecified 03/17/2019  . Pneumococcal Conjugate-13 12/17/2013  . Pneumococcal Polysaccharide-23 03/18/2005    Health Maintenance  Topic Date Due  . TETANUS/TDAP  03/19/2020 (Originally 10/25/1959)  . INFLUENZA VACCINE  Completed  . PNA vac Low Risk Adult  Completed       Assessment  This is a routine wellness examination for [name].  Health Maintenance: Due or Overdue There are no preventive care reminders to display for this patient.  [name] does not need a referral for Community Assistance: Care Management:   no Social Work:    no Prescription Assistance:  no Nutrition/Diabetes Education:  no   Plan:  Personalized Goals Goals Addressed            This Visit's Progress   . DIET - INCREASE WATER INTAKE       Try to drink 6-8 glasses of water daily.      Personalized Health Maintenance & Screening Recommendations  Td vaccine Advanced directives: has an advanced directive - a copy HAS NOT been provided. Shingles vaccine  Lung Cancer Screening Recommended: no (Low Dose CT Chest recommended if Age 11-80 years, 30 pack-year currently smoking OR have quit w/in past 15 years) Hepatitis C Screening recommended: no HIV Screening recommended: no  Advanced Directives: Written information was not prepared per patient's request.  Referrals & Orders No orders of the defined types were placed in this encounter.   Follow-up Plan . Follow-up with Sean Brooklyn,  FNP as planned . Schedule your Routine Eye exam as discussed . Consider TDAP and Shingles vaccines at your next visit with your PCP . Bring a copy of your Advanced Directives in for our records   I have personally reviewed and noted the following in the patient's chart:   . Medical and social history . Use of alcohol, tobacco or illicit drugs  . Current medications and supplements . Functional ability and status . Nutritional status . Physical activity . Advanced directives . List of other physicians . Hospitalizations, surgeries, and ER visits in previous 12 months . Vitals . Screenings to include cognitive, depression, and falls . Referrals and appointments  In addition, I have reviewed and discussed with [name] certain preventive protocols, quality metrics, and best practice recommendations. A written personalized care plan for preventive services as well as general preventive health recommendations is available and can be mailed to the patient at his request.      Milas Hock, LPN  [phone]     I have reviewed and agree with the above AWV documentation.   Evelina Dun, FNP

## 2019-04-15 ENCOUNTER — Other Ambulatory Visit: Payer: Self-pay | Admitting: Neurology

## 2019-04-15 DIAGNOSIS — Z77018 Contact with and (suspected) exposure to other hazardous metals: Secondary | ICD-10-CM

## 2019-04-17 ENCOUNTER — Ambulatory Visit
Admission: RE | Admit: 2019-04-17 | Discharge: 2019-04-17 | Disposition: A | Discharge status: Home / Self Care | Payer: Medicare Other | Source: Ambulatory Visit | Attending: Neurology | Admitting: Neurology

## 2019-04-17 ENCOUNTER — Other Ambulatory Visit: Payer: Self-pay

## 2019-04-17 DIAGNOSIS — H81399 Other peripheral vertigo, unspecified ear: Secondary | ICD-10-CM

## 2019-04-17 DIAGNOSIS — Z77018 Contact with and (suspected) exposure to other hazardous metals: Secondary | ICD-10-CM

## 2019-04-20 NOTE — Progress Notes (Signed)
Please call patient regarding the recent brain MRI: The brain scan showed a normal structure of the brain and mild volume loss which we call atrophy; this was considered age-appropriate. There were changes in the deeper structures of the brain, which we call white matter changes or microvascular changes. These were reported as moderate in His case. These are tiny white spots, that occur with time and are seen in a variety of conditions, including with normal aging, chronic hypertension, chronic headaches, especially migraine HAs, chronic diabetes, chronic hyperlipidemia. These are not strokes and no mass or lesion were seen which is reassuring. Again, there were no acute findings, such as a stroke, or mass or blood products. No further action is required on this test at this time, other than re-enforcing the importance of good blood pressure control, good cholesterol control, good blood sugar control, and weight management. Please remind patient to keep any upcoming appointments or tests and to call us with any interim questions, concerns, problems or updates. Thanks,  Star Age, MD, PhD

## 2019-04-22 ENCOUNTER — Telehealth: Payer: Self-pay

## 2019-04-22 NOTE — Telephone Encounter (Signed)
I called pt and discussed his MRI results and recommendations. Pt will keep his follow up in January. Pt verbalized understanding of results. Pt had no questions at this time but was encouraged to call back if questions arise.

## 2019-04-22 NOTE — Telephone Encounter (Signed)
-----   Message from Star Age, MD sent at 04/20/2019  6:19 PM EST ----- Please call patient regarding the recent brain MRI: The brain scan showed a normal structure of the brain and mild volume loss which we call atrophy; this was considered age-appropriate. There were changes in the deeper structures of the brain, which we call white matter changes or microvascular changes. These were reported as moderate in His case. These are tiny white spots, that occur with time and are seen in a variety of conditions, including with normal aging, chronic hypertension, chronic headaches, especially migraine HAs, chronic diabetes, chronic hyperlipidemia. These are not strokes and no mass or lesion were seen which is reassuring. Again, there were no acute findings, such as a stroke, or mass or blood products. No further action is required on this test at this time, other than re-enforcing the importance of good blood pressure control, good cholesterol control, good blood sugar control, and weight management. Please remind patient to keep any upcoming appointments or tests and to call us with any interim questions, concerns, problems or updates. Thanks,  Star Age, MD, PhD

## 2019-04-28 ENCOUNTER — Telehealth: Payer: Self-pay | Admitting: *Deleted

## 2019-04-28 ENCOUNTER — Encounter: Payer: Self-pay | Admitting: Family Medicine

## 2019-04-28 ENCOUNTER — Ambulatory Visit (INDEPENDENT_AMBULATORY_CARE_PROVIDER_SITE_OTHER): Payer: Medicare Other | Admitting: Family Medicine

## 2019-04-28 DIAGNOSIS — N412 Abscess of prostate: Secondary | ICD-10-CM | POA: Diagnosis not present

## 2019-04-28 DIAGNOSIS — R42 Dizziness and giddiness: Secondary | ICD-10-CM

## 2019-04-28 DIAGNOSIS — R269 Unspecified abnormalities of gait and mobility: Secondary | ICD-10-CM

## 2019-04-28 MED ORDER — DIAZEPAM 2 MG PO TABS: 2.0000 mg | ORAL_TABLET | Freq: Three times a day (TID) | ORAL | 0 refills | Status: DC | PRN

## 2019-04-28 NOTE — Telephone Encounter (Signed)
-----   Message from Loman Brooklyn, [place] sent at 04/28/2019  9:55 AM EST ----- Please call patient and let him know the prostate abscess could reoccur. I recommend he schedule an appointment with the urologist (Dr. Jeffie Pollock) as they may want to do a ultrasound to assess.

## 2019-04-28 NOTE — Telephone Encounter (Signed)
Called pt - aware of [employer] recommendations

## 2019-04-28 NOTE — Progress Notes (Signed)
Virtual Visit via Telephone Note  I connected with [name] on 04/28/19 at 9:08 AM by telephone and verified that I am speaking with the correct person using two identifiers. [name] is currently located at [facility] and nobody is currently with him during this visit. The provider, Loman Brooklyn, FNP is located in their home at time of visit.  I discussed the limitations, risks, security and privacy concerns of performing an evaluation and management service by telephone and the availability of in person appointments. I also discussed with the patient that there may be a patient responsible charge related to this service. The patient expressed understanding and agreed to proceed.  Subjective: PCP: Loman Brooklyn, FNP  Chief Complaint  Patient presents with  . Dizziness   Patient saw neurology on 04/09/2019 due to vertigo. He was encouraged to drink more water, change positions slowly, and use a cane for gait safety. He had a brain MRI on 04/17/2019 which showed no acute findings. He declined a sleep study that was suggested. He reports today that the Valium works for the vertigo. He only has 3 tablets left.   Patient is also concerned that he is dragging his right foot when walking. He is not having any hip pain on the right side but does on the left. His right knee is the one he had surgery on in the past. He is also worried the abscess on his prostate may be coming back as he feels this is the way he was feeling when he had the abscess. He is having dribbling to the point where he is holding himself while he walks to the bathroom so that he doesn't make a mess on the way. He denies dysuria. He reports having pain all over when he wakes up in the morning.    ROS: Per HPI  Current Outpatient Medications:  .  diazepam (VALIUM) 2 MG tablet, Take 1 tablet (2 mg total) by mouth every 8 (eight) hours as needed (dizziness)., Disp: 30 tablet, Rfl: 0 .  ezetimibe (ZETIA) 10 MG tablet,  TAKE 1 TABLET EVERY DAY (DISCONTINUE PRAVASTATIN), Disp: 90 tablet, Rfl: 3 .  finasteride (PROSCAR) 5 MG tablet, Take 5 mg by mouth daily., Disp: , Rfl:  .  meclizine (ANTIVERT) 25 MG tablet, Take 1 tablet (25 mg total) by mouth 3 (three) times daily as needed for dizziness., Disp: 30 tablet, Rfl: 2 .  metoprolol tartrate (LOPRESSOR) 25 MG tablet, TAKE 1 AND 1/2 TABLETS TWICE DAILY ( NEW DOSE ), Disp: 270 tablet, Rfl: 1 .  SYMBICORT 160-4.5 MCG/ACT inhaler, INHALE 2 PUFFS INTO THE LUNGS EVERY MORNING -APPOINTMENT WITH DR Melvyn Novas NEEDED FOR FURTHER REFILLS (Patient taking differently: Inhale 2 puffs into the lungs daily. ), Disp: 2 Inhaler, Rfl: 0 .  traZODone (DESYREL) 50 MG tablet, Take 50 mg by mouth at bedtime., Disp: , Rfl:  .  VITAMIN D PO, Take 2,000 Units by mouth daily., Disp: , Rfl:  .  warfarin (COUMADIN) 5 MG tablet, TAKE 1 TABLET DAILY EXCEPT 1/2 TABLET ON TUESDAYS AND FRIDAYS OR AS DIRECTD, Disp: 90 tablet, Rfl: 3  Allergies  Allergen Reactions  . Asa [Aspirin]     GI upset  . Niacin     REACTION: hot flashes   Past Medical History:  Diagnosis Date  . Anxiety disorder   . Benign prostatic hypertrophy   . Bilateral cataracts   . CAD (coronary artery disease)    Catheterization 2009, mild CAD  .  Carotid artery disease ([place])    Doppler, September, 2011, less than 50% bilateral  . COPD (chronic obstructive pulmonary disease) ([place])   . CVA (cerebral infarction)    Hospitalization.  MRI...    Left anterior cerebral artery territory, September, 2011... Coumadin started  . DYSLIPIDEMIA 11/01/2009   Qualifier: Diagnosis of  By: Ron Parker, MD, Leonidas Romberg Dorinda Hill   . Ejection fraction    EF 60%  . Ejection fraction    EF 55-60%, echo, September, 2011  . Gastroesophageal reflux disease   . Hyperlipidemia   . Persistent atrial fibrillation ([place])       . Ptosis of eyelid, left    Surgery in the past  . Sweating abnormality    Episodes of sweating appeared to be related to a delayed  reaction to niacin  . Vitamin D insufficiency     Observations/Objective: A&O  No respiratory distress or wheezing audible over the phone Mood, judgement, and thought processes all WNL  Assessment and Plan: 1. Vertigo - Keep follow-up appointments with neurology. Refilled Valium to have if needed. Currently vertigo has resolved. - diazepam (VALIUM) 2 MG tablet; Take 1 tablet (2 mg total) by mouth every 8 (eight) hours as needed (dizziness).  Dispense: 30 tablet; Refill: 0  2. Impaired gait - Patient to schedule appointment for face-to-face visit to assess and possibly order physical therapy.  3. Prostate abscess - Encouraged to call urology due to similar symptoms as when he had a prostate abscess.   Follow Up Instructions:  I discussed the assessment and treatment plan with the patient. The patient was provided an opportunity to ask questions and all were answered. The patient agreed with the plan and demonstrated an understanding of the instructions.   The patient was advised to call back or seek an in-person evaluation if the symptoms worsen or if the condition fails to improve as anticipated.  The above assessment and management plan was discussed with the patient. The patient verbalized understanding of and has agreed to the management plan. Patient is aware to call the clinic if symptoms persist or worsen. Patient is aware when to return to the clinic for a follow-up visit. Patient educated on when it is appropriate to go to the emergency department.   Time call ended: 9:20 AM  I provided 14 minutes of non-face-to-face time during this encounter.  [name], MSN, APRN, FNP-C [facility] 04/28/19

## 2019-04-30 ENCOUNTER — Ambulatory Visit (INDEPENDENT_AMBULATORY_CARE_PROVIDER_SITE_OTHER): Payer: Medicare Other | Admitting: *Deleted

## 2019-04-30 ENCOUNTER — Other Ambulatory Visit: Payer: Self-pay

## 2019-04-30 DIAGNOSIS — Z5181 Encounter for therapeutic drug level monitoring: Secondary | ICD-10-CM

## 2019-04-30 DIAGNOSIS — I4891 Unspecified atrial fibrillation: Secondary | ICD-10-CM | POA: Diagnosis not present

## 2019-04-30 DIAGNOSIS — I482 Chronic atrial fibrillation, unspecified: Secondary | ICD-10-CM | POA: Diagnosis not present

## 2019-04-30 LAB — POCT INR: INR: 3.2 — AB (ref 2.0–3.0)

## 2019-04-30 NOTE — Patient Instructions (Signed)
Decrease warfarin to 1 tablet daily except 1/2 tablet on Tuesdays and Thursdays Recheck in 6 weeks

## 2019-05-01 ENCOUNTER — Ambulatory Visit (INDEPENDENT_AMBULATORY_CARE_PROVIDER_SITE_OTHER): Payer: Medicare Other | Admitting: Urology

## 2019-05-01 DIAGNOSIS — N401 Enlarged prostate with lower urinary tract symptoms: Secondary | ICD-10-CM | POA: Diagnosis not present

## 2019-05-01 DIAGNOSIS — R3915 Urgency of urination: Secondary | ICD-10-CM | POA: Diagnosis not present

## 2019-05-01 DIAGNOSIS — R310 Gross hematuria: Secondary | ICD-10-CM

## 2019-05-07 ENCOUNTER — Other Ambulatory Visit: Payer: Self-pay

## 2019-05-08 ENCOUNTER — Encounter: Payer: Self-pay | Admitting: Family Medicine

## 2019-05-08 ENCOUNTER — Ambulatory Visit (INDEPENDENT_AMBULATORY_CARE_PROVIDER_SITE_OTHER): Payer: Medicare Other | Admitting: Family Medicine

## 2019-05-08 VITALS — BP 130/76 | HR 74 | Temp 97.8°F | Ht 66.0 in | Wt 173.0 lb

## 2019-05-08 DIAGNOSIS — R42 Dizziness and giddiness: Secondary | ICD-10-CM | POA: Diagnosis not present

## 2019-05-08 DIAGNOSIS — R2689 Other abnormalities of gait and mobility: Secondary | ICD-10-CM

## 2019-05-08 MED ORDER — MECLIZINE HCL 25 MG PO TABS: 25.0000 mg | ORAL_TABLET | Freq: Three times a day (TID) | ORAL | 2 refills | Status: DC | PRN

## 2019-05-08 NOTE — Progress Notes (Signed)
Assessment & Plan:  1. Drags leg - Patient to keep appointment with Dr. French Ana later today. - Ambulatory referral to Physical Therapy  2. Vertigo - Patient in need of refill. - meclizine (ANTIVERT) 25 MG tablet; Take 1 tablet (25 mg total) by mouth 3 (three) times daily as needed for dizziness.  Dispense: 30 tablet; Refill: 2   Return as scheduled.  [name], MSN, APRN, FNP-C [facility]  Subjective:    Patient ID: [name], male    DOB: 09-Jul-1940, 78 y.o.   MRN: [id number]  Patient Care Team: Loman Brooklyn, FNP as PCP - General (Family Medicine) Harl Bowie Alphonse Guild, MD as Consulting Physician (Cardiology) Irine Seal, MD as Attending Physician (Urology)   Chief Complaint:  Chief Complaint  Patient presents with  . face to face for physical therapy    HPI: [name] is a 78 y.o. male presenting on 05/08/2019 for face to face for physical therapy  Patient here due to gait abnormality.  He reports he has been dragging his right foot when walking and does not feel very stable.  Denies any hip pain on the right but he does experience hip pain on the left.  His right knee is the one that he had surgery on in the past.  He has an appointment with Dr. French Ana who did that previous knee surgery later today.  He feels as if he has no strength in his right leg.  Very little back pain and only occurring when he lays flat in the bed at night.  He did see Dr. Jeffie Pollock last week who told him that everything looks good.  He was advised to cut out coffee and soda.  New complaints: None  Social history:  Relevant past medical, surgical, family and social history reviewed and updated as indicated. Interim medical history since our last visit reviewed.  Allergies and medications reviewed and updated.  DATA REVIEWED: CHART IN EPIC  ROS: Negative unless specifically indicated above in HPI.    Current Outpatient Medications:  .  diazepam  (VALIUM) 2 MG tablet, Take 1 tablet (2 mg total) by mouth every 8 (eight) hours as needed (dizziness)., Disp: 30 tablet, Rfl: 0 .  ezetimibe (ZETIA) 10 MG tablet, TAKE 1 TABLET EVERY DAY (DISCONTINUE PRAVASTATIN), Disp: 90 tablet, Rfl: 3 .  levalbuterol (XOPENEX) 0.63 MG/3ML nebulizer solution, Inhale into the lungs., Disp: , Rfl:  .  meclizine (ANTIVERT) 25 MG tablet, Take 1 tablet (25 mg total) by mouth 3 (three) times daily as needed for dizziness., Disp: 30 tablet, Rfl: 2 .  metoprolol tartrate (LOPRESSOR) 25 MG tablet, TAKE 1 AND 1/2 TABLETS TWICE DAILY ( NEW DOSE ), Disp: 270 tablet, Rfl: 1 .  SYMBICORT 160-4.5 MCG/ACT inhaler, INHALE 2 PUFFS INTO THE LUNGS EVERY MORNING -APPOINTMENT WITH DR Melvyn Novas NEEDED FOR FURTHER REFILLS (Patient taking differently: Inhale 2 puffs into the lungs daily. ), Disp: 2 Inhaler, Rfl: 0 .  traZODone (DESYREL) 50 MG tablet, Take 50 mg by mouth at bedtime., Disp: , Rfl:  .  VITAMIN D PO, Take 2,000 Units by mouth daily., Disp: , Rfl:  .  warfarin (COUMADIN) 5 MG tablet, TAKE 1 TABLET DAILY EXCEPT 1/2 TABLET ON TUESDAYS AND FRIDAYS OR AS DIRECTD, Disp: 90 tablet, Rfl: 3 .  finasteride (PROSCAR) 5 MG tablet, Take 5 mg by mouth daily., Disp: , Rfl:    Allergies  Allergen Reactions  . Asa [Aspirin]     GI upset  .  Niacin     REACTION: hot flashes   Past Medical History:  Diagnosis Date  . Anxiety disorder   . Benign prostatic hypertrophy   . Bilateral cataracts   . CAD (coronary artery disease)    Catheterization 2009, mild CAD  . Carotid artery disease ([place])    Doppler, September, 2011, less than 50% bilateral  . COPD (chronic obstructive pulmonary disease) ([place])   . CVA (cerebral infarction)    Hospitalization.  MRI...    Left anterior cerebral artery territory, September, 2011... Coumadin started  . DYSLIPIDEMIA 11/01/2009   Qualifier: Diagnosis of  By: Ron Parker, MD, [facility]   . Ejection fraction    EF 60%  . Ejection fraction    EF 55-60%,  echo, September, 2011  . Gastroesophageal reflux disease   . Hyperlipidemia   . Persistent atrial fibrillation ([place])       . Ptosis of eyelid, left    Surgery in the past  . Sweating abnormality    Episodes of sweating appeared to be related to a delayed reaction to niacin  . Vitamin D insufficiency     Past Surgical History:  Procedure Laterality Date  . APPENDECTOMY    . CATARACT EXTRACTION, BILATERAL    . KNEE SURGERY Right 2019   torn ligament?   . TRANSURETHRAL RESECTION OF PROSTATE N/A 08/19/2018   Procedure: TRANSURETHRAL RESECTION OF THE PROSTATE (TURP) OF PROSTATE ABCESS;  Surgeon: Alexis Frock, MD;  Location: [facility];  Service: Urology;  Laterality: N/A;    Social History   Socioeconomic History  . Marital status: Married    Spouse name: shirley  . Number of children: 5  . Years of education: 3  . Highest education level: 11th grade  Occupational History  . Occupation: Retired English as a second language teacher  . Financial resource strain: Not hard at all  . Food insecurity    Worry: Never true    Inability: Never true  . Transportation needs    Medical: No    Non-medical: No  Tobacco Use  . Smoking status: Former Smoker    Packs/day: 3.00    Years: 50.00    Pack years: 150.00    Types: Cigarettes    Quit date: 03/18/1990    Years since quitting: 29.1  . Smokeless tobacco: Former Systems developer    Types: [place] date: 06/18/2000  Substance and Sexual Activity  . Alcohol use: No    Alcohol/week: 0.0 standard drinks  . Drug use: No  . Sexual activity: Not Currently  Lifestyle  . Physical activity    Days per week: 7 days    Minutes per session: 20 min  . Stress: Not at all  Relationships  . Social connections    Talks on phone: More than three times a week    Gets together: More than three times a week    Attends religious service: More than 4 times per year    Active member of club or organization: Yes    Attends meetings of clubs or organizations: More  than 4 times per year    Relationship status: Married  . Intimate partner violence    Fear of current or ex partner: No    Emotionally abused: No    Physically abused: No    Forced sexual activity: No  Other Topics Concern  . Not on file  Social History Narrative   Quit smoking in 1993. No significant alcohol abuse. Retired from trucking.  Objective:    BP 130/76   Pulse 74   Temp 97.8 F (36.6 C) (Temporal)   Ht 5\' 6"  (1.676 m)   Wt 173 lb (78.5 kg)   SpO2 97%   BMI 27.92 kg/m   Physical Exam Vitals signs reviewed.  Constitutional:      General: He is not in acute distress.    Appearance: Normal appearance. He is overweight. He is not ill-appearing, toxic-appearing or diaphoretic.  HENT:     Head: Normocephalic and atraumatic.  Eyes:     General: No scleral icterus.       Right eye: No discharge.        Left eye: No discharge.     Conjunctiva/sclera: Conjunctivae normal.  Neck:     Musculoskeletal: Normal range of motion.  Cardiovascular:     Rate and Rhythm: Normal rate.  Pulmonary:     Effort: Pulmonary effort is normal. No respiratory distress.  Musculoskeletal: Normal range of motion.     Comments: Patient has equal strength in bilateral lower extremities and feet when pushing and pulling against me.  He may have a slight discrepancy in leg length with the right being slightly longer than the left.  No back pain.  Negative Lachman's, McMurray's, posterior drawer test, valgus stress, and varus stress.  Mild hip pain with internal rotation.  Skin:    General: Skin is warm and dry.  Neurological:     Mental Status: He is alert and oriented to person, place, and time. Mental status is at baseline.  Psychiatric:        Mood and Affect: Mood normal.        Behavior: Behavior normal.        Thought Content: Thought content normal.        Judgment: Judgment normal.     Lab Results  Component Value Date   TSH 4.360 04/07/2019   Lab Results  Component  Value Date   WBC 6.0 03/20/2019   HGB 14.3 03/20/2019   HCT 43.1 03/20/2019   MCV 88 03/20/2019   PLT 198 03/20/2019   Lab Results  Component Value Date   NA 143 03/20/2019   K 4.4 03/20/2019   CO2 27 03/20/2019   GLUCOSE 108 (H) 03/20/2019   BUN 18 03/20/2019   CREATININE 1.17 03/20/2019   BILITOT 0.5 03/20/2019   ALKPHOS 62 03/20/2019   AST 16 03/20/2019   ALT 9 03/20/2019   PROT 6.4 03/20/2019   ALBUMIN 4.1 03/20/2019   CALCIUM 9.6 03/20/2019   ANIONGAP 6 08/20/2018   Lab Results  Component Value Date   CHOL 216 (H) 03/20/2019   Lab Results  Component Value Date   HDL 35 (L) 03/20/2019   Lab Results  Component Value Date   LDLCALC 148 (H) 03/20/2019   Lab Results  Component Value Date   TRIG 181 (H) 03/20/2019   Lab Results  Component Value Date   CHOLHDL 6.2 (H) 03/20/2019   Lab Results  Component Value Date   HGBA1C 6.2 03/20/2019

## 2019-05-20 ENCOUNTER — Encounter: Payer: Self-pay | Admitting: Family Medicine

## 2019-05-20 ENCOUNTER — Ambulatory Visit (INDEPENDENT_AMBULATORY_CARE_PROVIDER_SITE_OTHER): Payer: Medicare Other | Admitting: Family Medicine

## 2019-05-20 DIAGNOSIS — E559 Vitamin D deficiency, unspecified: Secondary | ICD-10-CM | POA: Diagnosis not present

## 2019-05-20 DIAGNOSIS — R42 Dizziness and giddiness: Secondary | ICD-10-CM

## 2019-05-20 DIAGNOSIS — M545 Low back pain, unspecified: Secondary | ICD-10-CM

## 2019-05-20 MED ORDER — METHOCARBAMOL 500 MG PO TABS: 500.0000 mg | ORAL_TABLET | Freq: Three times a day (TID) | ORAL | 0 refills | Status: DC | PRN

## 2019-05-20 MED ORDER — VITAMIN D (ERGOCALCIFEROL) 1.25 MG (50000 UNIT) PO CAPS: 50000.0000 [IU] | ORAL_CAPSULE | ORAL | 1 refills | Status: DC

## 2019-05-20 NOTE — Progress Notes (Signed)
Virtual Visit via Telephone Note  I connected with [name] on 05/20/19 at 8:36 AM by telephone and verified that I am speaking with the correct person using two identifiers. [name] is currently located at home and nobody is currently with him during this visit. The provider, Loman Brooklyn, FNP is located in their office at time of visit.  I discussed the limitations, risks, security and privacy concerns of performing an evaluation and management service by telephone and the availability of in person appointments. I also discussed with the patient that there may be a patient responsible charge related to this service. The patient expressed understanding and agreed to proceed.  Subjective: PCP: Loman Brooklyn, FNP  Chief Complaint  Patient presents with   Dizziness   Back Pain   Patient reports he was seen by Dr. French Ana and given a cortisone shot in his knee.  He reports he is doing better since having this done.  He still feels like he has no energy, is stiff, and sore.  He reports for the past 10 days his back on the left side has been hurting.  It is worse when he is laying down.  It improves when he gets up and is moving about.  He has been using a heating pad and applying liniment horse ointment.  The vertigo continues daily.  He definitely experiences 10 to 15 seconds every morning.  He is taking meclizine throughout the day.  He is interested in vestibular rehab at this point.  Has seen neurology and had a negative MRI.   ROS: Per HPI  Current Outpatient Medications:    diazepam (VALIUM) 2 MG tablet, Take 1 tablet (2 mg total) by mouth every 8 (eight) hours as needed (dizziness)., Disp: 30 tablet, Rfl: 0   ezetimibe (ZETIA) 10 MG tablet, TAKE 1 TABLET EVERY DAY (DISCONTINUE PRAVASTATIN), Disp: 90 tablet, Rfl: 3   finasteride (PROSCAR) 5 MG tablet, Take 5 mg by mouth daily., Disp: , Rfl:    levalbuterol (XOPENEX) 0.63 MG/3ML nebulizer solution, Inhale  into the lungs., Disp: , Rfl:    meclizine (ANTIVERT) 25 MG tablet, Take 1 tablet (25 mg total) by mouth 3 (three) times daily as needed for dizziness., Disp: 30 tablet, Rfl: 2   methocarbamol (ROBAXIN) 500 MG tablet, Take 1 tablet (500 mg total) by mouth every 8 (eight) hours as needed for muscle spasms., Disp: 30 tablet, Rfl: 0   metoprolol tartrate (LOPRESSOR) 25 MG tablet, TAKE 1 AND 1/2 TABLETS TWICE DAILY ( NEW DOSE ), Disp: 270 tablet, Rfl: 1   SYMBICORT 160-4.5 MCG/ACT inhaler, INHALE 2 PUFFS INTO THE LUNGS EVERY MORNING -APPOINTMENT WITH DR Melvyn Novas NEEDED FOR FURTHER REFILLS (Patient taking differently: Inhale 2 puffs into the lungs daily. ), Disp: 2 Inhaler, Rfl: 0   traZODone (DESYREL) 50 MG tablet, Take 50 mg by mouth at bedtime., Disp: , Rfl:    Vitamin D, Ergocalciferol, (DRISDOL) 1.25 MG (50000 UT) CAPS capsule, Take 1 capsule (50,000 Units total) by mouth every 7 (seven) days., Disp: 12 capsule, Rfl: 1   warfarin (COUMADIN) 5 MG tablet, TAKE 1 TABLET DAILY EXCEPT 1/2 TABLET ON TUESDAYS AND FRIDAYS OR AS DIRECTD, Disp: 90 tablet, Rfl: 3  Allergies  Allergen Reactions   Asa [Aspirin]     GI upset   Niacin     REACTION: hot flashes   Past Medical History:  Diagnosis Date   Anxiety disorder    Benign prostatic hypertrophy  Bilateral cataracts    CAD (coronary artery disease)    Catheterization 2009, mild CAD   Carotid artery disease ([facility])    Doppler, September, 2011, less than 50% bilateral   COPD (chronic obstructive pulmonary disease) ([facility])    CVA (cerebral infarction)    Hospitalization.  MRI...    Left anterior cerebral artery territory, September, 2011... Coumadin started   DYSLIPIDEMIA 11/01/2009   Qualifier: Diagnosis of  By: Ron Parker, MD, [facility], [facility]    Ejection fraction    EF 60%   Ejection fraction    EF 55-60%, echo, September, 2011   Gastroesophageal reflux disease    Hyperlipidemia    Persistent atrial fibrillation ([facility])         Ptosis of eyelid, left    Surgery in the past   Sweating abnormality    Episodes of sweating appeared to be related to a delayed reaction to niacin   Vitamin D insufficiency     Observations/Objective: A&O  No respiratory distress or wheezing audible over the phone Mood, judgement, and thought processes all WNL  Assessment and Plan: 1. Vertigo - Continue cleansing as needed.  Discussed changing positions slowly. - AMB referral to rehabilitation  2. Acute left-sided low back pain without sciatica - Patient continue with heating pad and liniment hoarse ointment.  Discussed he may try using a muscle relaxer as needed.  I have sent Robaxin 500 mg p.o. every 8 hours as needed.  3. Vitamin D insufficiency - Changed patient from 2000 units once daily to the 50,000 units once weekly. - Vitamin D, Ergocalciferol, (DRISDOL) 1.25 MG (50000 UT) CAPS capsule; Take 1 capsule (50,000 Units total) by mouth every 7 (seven) days.  Dispense: 12 capsule; Refill: 1   Follow Up Instructions:  I discussed the assessment and treatment plan with the patient. The patient was provided an opportunity to ask questions and all were answered. The patient agreed with the plan and demonstrated an understanding of the instructions.   The patient was advised to call back or seek an in-person evaluation if the symptoms worsen or if the condition fails to improve as anticipated.  The above assessment and management plan was discussed with the patient. The patient verbalized understanding of and has agreed to the management plan. Patient is aware to call the clinic if symptoms persist or worsen. Patient is aware when to return to the clinic for a follow-up visit. Patient educated on when it is appropriate to go to the emergency department.   Time call ended: 8:54 AM  I provided 19 minutes of non-face-to-face time during this encounter.  [name], MSN, APRN, FNP-C [facility] 05/20/19

## 2019-05-21 ENCOUNTER — Ambulatory Visit: Payer: Medicare Other | Admitting: Family Medicine

## 2019-06-10 ENCOUNTER — Ambulatory Visit (INDEPENDENT_AMBULATORY_CARE_PROVIDER_SITE_OTHER): Payer: Medicare Other | Admitting: *Deleted

## 2019-06-10 ENCOUNTER — Other Ambulatory Visit: Payer: Self-pay

## 2019-06-10 DIAGNOSIS — Z5181 Encounter for therapeutic drug level monitoring: Secondary | ICD-10-CM | POA: Diagnosis not present

## 2019-06-10 DIAGNOSIS — I4891 Unspecified atrial fibrillation: Secondary | ICD-10-CM | POA: Diagnosis not present

## 2019-06-10 DIAGNOSIS — I482 Chronic atrial fibrillation, unspecified: Secondary | ICD-10-CM

## 2019-06-10 LAB — POCT INR: INR: 2.6 (ref 2.0–3.0)

## 2019-06-10 NOTE — Patient Instructions (Signed)
Continue warfarin 1 tablet daily except 1/2 tablet on Tuesdays and Thursdays Recheck in 6 weeks 

## 2019-06-22 ENCOUNTER — Other Ambulatory Visit: Payer: Self-pay | Admitting: Cardiology

## 2019-07-03 ENCOUNTER — Encounter: Payer: Self-pay | Admitting: *Deleted

## 2019-07-13 ENCOUNTER — Ambulatory Visit: Payer: Medicare Other | Admitting: Neurology

## 2019-07-20 ENCOUNTER — Telehealth: Payer: Self-pay | Admitting: Family Medicine

## 2019-07-20 NOTE — Telephone Encounter (Signed)
Patient aware.

## 2019-07-20 NOTE — Telephone Encounter (Signed)
Yes, no restrictions with warfarin.

## 2019-07-23 ENCOUNTER — Encounter (INDEPENDENT_AMBULATORY_CARE_PROVIDER_SITE_OTHER): Payer: Self-pay

## 2019-07-23 ENCOUNTER — Ambulatory Visit (INDEPENDENT_AMBULATORY_CARE_PROVIDER_SITE_OTHER): Payer: Medicare Other | Admitting: *Deleted

## 2019-07-23 ENCOUNTER — Other Ambulatory Visit: Payer: Self-pay

## 2019-07-23 DIAGNOSIS — I4891 Unspecified atrial fibrillation: Secondary | ICD-10-CM | POA: Diagnosis not present

## 2019-07-23 DIAGNOSIS — I482 Chronic atrial fibrillation, unspecified: Secondary | ICD-10-CM | POA: Diagnosis not present

## 2019-07-23 DIAGNOSIS — Z5181 Encounter for therapeutic drug level monitoring: Secondary | ICD-10-CM

## 2019-07-23 LAB — POCT INR: INR: 2.5 (ref 2.0–3.0)

## 2019-07-23 NOTE — Patient Instructions (Signed)
Continue warfarin 1 tablet daily except 1/2 tablet on Tuesdays and Thursdays Recheck in 6 weeks 

## 2019-07-30 ENCOUNTER — Telehealth: Payer: Self-pay | Admitting: Family Medicine

## 2019-07-30 NOTE — Telephone Encounter (Signed)
Patient has a follow up appointment scheduled. 

## 2019-07-30 NOTE — Telephone Encounter (Signed)
What is the name of the medication? SYMBICORT 160-4.5 MCG/ACT inhaler    Have you contacted your pharmacy to request a refill? Yes  Which pharmacy would you like this sent to? [facility] mail order   Patient notified that their request is being sent to the clinical staff for review and that they should receive a call once it is complete. If they do not receive a call within 24 hours they can check with their pharmacy or our office.

## 2019-07-31 ENCOUNTER — Ambulatory Visit: Payer: Medicare Other | Admitting: Urology

## 2019-08-03 ENCOUNTER — Other Ambulatory Visit: Payer: Self-pay

## 2019-08-04 ENCOUNTER — Encounter: Payer: Self-pay | Admitting: Family Medicine

## 2019-08-04 ENCOUNTER — Other Ambulatory Visit: Payer: Self-pay

## 2019-08-04 ENCOUNTER — Ambulatory Visit (INDEPENDENT_AMBULATORY_CARE_PROVIDER_SITE_OTHER): Payer: Medicare Other | Admitting: Family Medicine

## 2019-08-04 VITALS — BP 138/78 | HR 76 | Temp 97.5°F | Ht 66.0 in | Wt 171.4 lb

## 2019-08-04 DIAGNOSIS — J449 Chronic obstructive pulmonary disease, unspecified: Secondary | ICD-10-CM

## 2019-08-04 DIAGNOSIS — R42 Dizziness and giddiness: Secondary | ICD-10-CM | POA: Diagnosis not present

## 2019-08-04 DIAGNOSIS — K219 Gastro-esophageal reflux disease without esophagitis: Secondary | ICD-10-CM

## 2019-08-04 DIAGNOSIS — E559 Vitamin D deficiency, unspecified: Secondary | ICD-10-CM | POA: Diagnosis not present

## 2019-08-04 MED ORDER — LEVALBUTEROL HCL 0.63 MG/3ML IN NEBU: 0.6300 mg | INHALATION_SOLUTION | Freq: Four times a day (QID) | RESPIRATORY_TRACT | 2 refills | Status: DC | PRN

## 2019-08-04 MED ORDER — BUDESONIDE-FORMOTEROL FUMARATE 160-4.5 MCG/ACT IN AERO: 2.0000 | INHALATION_SPRAY | Freq: Every day | RESPIRATORY_TRACT | 2 refills | Status: DC

## 2019-08-04 MED ORDER — VITAMIN D (ERGOCALCIFEROL) 1.25 MG (50000 UNIT) PO CAPS: 50000.0000 [IU] | ORAL_CAPSULE | ORAL | 2 refills | Status: DC

## 2019-08-04 NOTE — Progress Notes (Signed)
Assessment & Plan:  1. Vertigo - Improved.  2. Vitamin D insufficiency - Continue current regimen. Will re-check vitamin D level at his next visit.  - Vitamin D, Ergocalciferol, (DRISDOL) 1.25 MG (50000 UNIT) CAPS capsule; Take 1 capsule (50,000 Units total) by mouth every 7 (seven) days.  Dispense: 12 capsule; Refill: 2  3. 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  Patient does not feel he will qualify for assistance through [employer] as he has been told by his cardiologist that to get assistance you cannot own anything and he owns too much.  4. Gastroesophageal reflux disease, unspecified whether esophagitis present - Encouraged to try OTC famotidine.   Return in about 3 months (around 11/01/2019) for follow-up of chronic medication conditions.  [name], MSN, APRN, FNP-C [facility]  Subjective:    Patient ID: [name], male    DOB: 27-Oct-1940, 79 y.o.   MRN: [id number]  Patient Care Team: Loman Brooklyn, FNP as PCP - General (Family Medicine) Harl Bowie Alphonse Guild, MD as Consulting Physician (Cardiology) Irine Seal, MD as Attending Physician (Urology)   Chief Complaint:  Chief Complaint  Patient presents with  . Dizziness    follow up- Patient states that he still has it but it has gotten better.  . Medical Management of Chronic Issues    check up of chronic medical conditions    HPI: [name] is a 79 y.o. male presenting on 08/04/2019 for Dizziness (follow up- Patient states that he still has it but it has gotten better.) and Medical Management of Chronic Issues (check up of chronic medical conditions)  Patient is accompanied by his wife who he is okay with being present.  Vertigo has improved significantly.  Patient continues with vitamin D 50,000 units once weekly.   New complaints: Patient would like something to take for acid reflux.  He has been eating a lot of Tums and Rolaids  recently.  Social history:  Relevant past medical, surgical, family and social history reviewed and updated as indicated. Interim medical history since our last visit reviewed.  Allergies and medications reviewed and updated.  DATA REVIEWED: CHART IN EPIC  ROS: Negative unless specifically indicated above in HPI.    Current Outpatient Medications:  .  budesonide-formoterol (SYMBICORT) 160-4.5 MCG/ACT inhaler, Inhale 2 puffs into the lungs daily., Disp: 3 Inhaler, Rfl: 2 .  diazepam (VALIUM) 2 MG tablet, Take 1 tablet (2 mg total) by mouth every 8 (eight) hours as needed (dizziness)., Disp: 30 tablet, Rfl: 0 .  ezetimibe (ZETIA) 10 MG tablet, TAKE 1 TABLET EVERY DAY (DISCONTINUE PRAVASTATIN), Disp: 90 tablet, Rfl: 3 .  levalbuterol (XOPENEX) 0.63 MG/3ML nebulizer solution, Take 3 mLs (0.63 mg total) by nebulization every 6 (six) hours as needed for wheezing or shortness of breath., Disp: 90 mL, Rfl: 2 .  meclizine (ANTIVERT) 25 MG tablet, Take 1 tablet (25 mg total) by mouth 3 (three) times daily as needed for dizziness., Disp: 30 tablet, Rfl: 2 .  methocarbamol (ROBAXIN) 500 MG tablet, Take 1 tablet (500 mg total) by mouth every 8 (eight) hours as needed for muscle spasms., Disp: 30 tablet, Rfl: 0 .  metoprolol tartrate (LOPRESSOR) 25 MG tablet, TAKE 1 AND 1/2 TABLETS TWICE DAILY ( NEW DOSE ), Disp: 270 tablet, Rfl: 1 .  traZODone (DESYREL) 50 MG tablet, Take 50 mg by mouth at bedtime., Disp: , Rfl:  .  Vitamin D, Ergocalciferol, (DRISDOL) 1.25 MG (50000  UNIT) CAPS capsule, Take 1 capsule (50,000 Units total) by mouth every 7 (seven) days., Disp: 12 capsule, Rfl: 2 .  warfarin (COUMADIN) 5 MG tablet, TAKE 1 TABLET DAILY EXCEPT 1/2 TABLET ON TUESDAYS AND FRIDAYS OR AS DIRECTD, Disp: 90 tablet, Rfl: 3   Allergies  Allergen Reactions  . Asa [Aspirin]     GI upset  . Niacin     REACTION: hot flashes   Past Medical History:  Diagnosis Date  . Anxiety disorder   . Benign prostatic  hypertrophy   . Bilateral cataracts   . CAD (coronary artery disease)    Catheterization 2009, mild CAD  . Carotid artery disease ([place])    Doppler, September, 2011, less than 50% bilateral  . COPD (chronic obstructive pulmonary disease) ([place])   . CVA (cerebral infarction)    Hospitalization.  MRI...    Left anterior cerebral artery territory, September, 2011... Coumadin started  . DYSLIPIDEMIA 11/01/2009   Qualifier: Diagnosis of  By: Ron Parker, MD, Leonidas Romberg Dorinda Hill   . Ejection fraction    EF 60%  . Ejection fraction    EF 55-60%, echo, September, 2011  . Gastroesophageal reflux disease   . Hyperlipidemia   . Persistent atrial fibrillation ([place])       . Ptosis of eyelid, left    Surgery in the past  . Sweating abnormality    Episodes of sweating appeared to be related to a delayed reaction to niacin  . Vitamin D insufficiency     Past Surgical History:  Procedure Laterality Date  . APPENDECTOMY    . CATARACT EXTRACTION, BILATERAL    . KNEE SURGERY Right 2019   torn ligament?   . TRANSURETHRAL RESECTION OF PROSTATE N/A 08/19/2018   Procedure: TRANSURETHRAL RESECTION OF THE PROSTATE (TURP) OF PROSTATE ABCESS;  Surgeon: Alexis Frock, MD;  Location: [facility];  Service: Urology;  Laterality: N/A;    Social History   Socioeconomic History  . Marital status: Married    Spouse name: shirley  . Number of children: 5  . Years of education: 71  . Highest education level: 11th grade  Occupational History  . Occupation: Retired Statistician   Tobacco Use  . Smoking status: Former Smoker    Packs/day: 3.00    Years: 50.00    Pack years: 150.00    Types: Cigarettes    Quit date: 03/18/1990    Years since quitting: 29.4  . Smokeless tobacco: Former Systems developer    Types: [place] date: 06/18/2000  Substance and Sexual Activity  . Alcohol use: No    Alcohol/week: 0.0 standard drinks  . Drug use: No  . Sexual activity: Not Currently  Other Topics Concern  . Not on file  Social  History Narrative   Quit smoking in 1993. No significant alcohol abuse. Retired from trucking.    Social Determinants of Health   Financial Resource Strain: Low Risk   . Difficulty of Paying Living Expenses: Not hard at all  Food Insecurity: No Food Insecurity  . Worried About Charity fundraiser in the Last Year: Never true  . Ran Out of Food in the Last Year: Never true  Transportation Needs: No Transportation Needs  . Lack of Transportation (Medical): No  . Lack of Transportation (Non-Medical): No  Physical Activity: Insufficiently Active  . Days of Exercise per Week: 7 days  . Minutes of Exercise per Session: 20 min  Stress: No Stress Concern Present  . Feeling of Stress :  Not at all  Social Connections: Not Isolated  . Frequency of Communication with Friends and Family: More than three times a week  . Frequency of Social Gatherings with Friends and Family: More than three times a week  . Attends Religious Services: More than 4 times per year  . Active Member of Clubs or Organizations: Yes  . Attends Archivist Meetings: More than 4 times per year  . Marital Status: Married  Human resources officer Violence: Not At Risk  . Fear of Current or Ex-Partner: No  . Emotionally Abused: No  . Physically Abused: No  . Sexually Abused: No        Objective:    BP 138/78   Pulse 76   Temp (!) 97.5 F (36.4 C) (Temporal)   Ht 5\' 6"  (1.676 m)   Wt 171 lb 6.4 oz (77.7 kg)   SpO2 94%   BMI 27.66 kg/m   Physical Exam Vitals reviewed.  Constitutional:      General: He is not in acute distress.    Appearance: Normal appearance. He is overweight. He is not ill-appearing, toxic-appearing or diaphoretic.  HENT:     Head: Normocephalic and atraumatic.  Eyes:     General: No scleral icterus.       Right eye: No discharge.        Left eye: No discharge.     Conjunctiva/sclera: Conjunctivae normal.  Cardiovascular:     Rate and Rhythm: Normal rate and regular rhythm.     Heart  sounds: Normal heart sounds. No murmur. No friction rub. No gallop.   Pulmonary:     Effort: Pulmonary effort is normal. No respiratory distress.     Breath sounds: Normal breath sounds. No stridor. No wheezing, rhonchi or rales.  Musculoskeletal:        General: Normal range of motion.     Cervical back: Normal range of motion.  Skin:    General: Skin is warm and dry.  Neurological:     Mental Status: He is alert and oriented to person, place, and time. Mental status is at baseline.  Psychiatric:        Mood and Affect: Mood normal.        Behavior: Behavior normal.        Thought Content: Thought content normal.        Judgment: Judgment normal.     Lab Results  Component Value Date   TSH 4.360 04/07/2019   Lab Results  Component Value Date   WBC 6.0 03/20/2019   HGB 14.3 03/20/2019   HCT 43.1 03/20/2019   MCV 88 03/20/2019   PLT 198 03/20/2019   Lab Results  Component Value Date   NA 143 03/20/2019   K 4.4 03/20/2019   CO2 27 03/20/2019   GLUCOSE 108 (H) 03/20/2019   BUN 18 03/20/2019   CREATININE 1.17 03/20/2019   BILITOT 0.5 03/20/2019   ALKPHOS 62 03/20/2019   AST 16 03/20/2019   ALT 9 03/20/2019   PROT 6.4 03/20/2019   ALBUMIN 4.1 03/20/2019   CALCIUM 9.6 03/20/2019   ANIONGAP 6 08/20/2018   Lab Results  Component Value Date   CHOL 216 (H) 03/20/2019   Lab Results  Component Value Date   HDL 35 (L) 03/20/2019   Lab Results  Component Value Date   LDLCALC 148 (H) 03/20/2019   Lab Results  Component Value Date   TRIG 181 (H) 03/20/2019   Lab Results  Component Value Date  CHOLHDL 6.2 (H) 03/20/2019   Lab Results  Component Value Date   HGBA1C 6.2 03/20/2019

## 2019-08-06 ENCOUNTER — Encounter: Payer: Self-pay | Admitting: Family Medicine

## 2019-08-14 ENCOUNTER — Telehealth: Payer: Self-pay | Admitting: Family Medicine

## 2019-08-14 NOTE — Telephone Encounter (Signed)
Patient's insurance will not pay for script of xopenex that was sent in.  Please send in different script. Thanks.

## 2019-08-17 ENCOUNTER — Other Ambulatory Visit: Payer: Self-pay | Admitting: Cardiology

## 2019-08-17 MED ORDER — ALBUTEROL SULFATE (2.5 MG/3ML) 0.083% IN NEBU: 2.5000 mg | INHALATION_SOLUTION | Freq: Four times a day (QID) | RESPIRATORY_TRACT | 1 refills | Status: DC | PRN

## 2019-08-17 NOTE — Telephone Encounter (Signed)
Patient states that he is on xopenex but insurance will no longer cover.  States per [facility] they will cover Albuterol.

## 2019-08-17 NOTE — Telephone Encounter (Signed)
Can we please follow-up with patient about why he was using Xopenex and not Albuterol? I believe it was changed in a hospital encounter. I am happy to send Albuterol.

## 2019-08-17 NOTE — Telephone Encounter (Signed)
Nebulizer prescription for Albuterol sent.

## 2019-08-19 ENCOUNTER — Ambulatory Visit (INDEPENDENT_AMBULATORY_CARE_PROVIDER_SITE_OTHER): Payer: Medicare Other | Admitting: Family Medicine

## 2019-08-19 ENCOUNTER — Encounter: Payer: Self-pay | Admitting: Family Medicine

## 2019-08-19 ENCOUNTER — Other Ambulatory Visit: Payer: Self-pay

## 2019-08-19 DIAGNOSIS — J441 Chronic obstructive pulmonary disease with (acute) exacerbation: Secondary | ICD-10-CM | POA: Diagnosis not present

## 2019-08-19 MED ORDER — PREDNISONE 10 MG (21) PO TBPK: ORAL_TABLET | ORAL | 0 refills | Status: DC

## 2019-08-19 MED ORDER — AMOXICILLIN-POT CLAVULANATE 875-125 MG PO TABS: 1.0000 | ORAL_TABLET | Freq: Two times a day (BID) | ORAL | 0 refills | Status: DC

## 2019-08-19 MED ORDER — ALBUTEROL SULFATE (2.5 MG/3ML) 0.083% IN NEBU: 2.5000 mg | INHALATION_SOLUTION | Freq: Four times a day (QID) | RESPIRATORY_TRACT | 0 refills | Status: DC | PRN

## 2019-08-19 NOTE — Progress Notes (Signed)
Virtual Visit via Telephone Note  I connected with [name] on 08/19/19 at 12:03 PM by telephone and verified that I am speaking with the correct person using two identifiers. [name] is currently located at home and nobody is currently with him during this visit. The provider, Loman Brooklyn, FNP is located in their office at time of visit.  I discussed the limitations, risks, security and privacy concerns of performing an evaluation and management service by telephone and the availability of in person appointments. I also discussed with the patient that there may be a patient responsible charge related to this service. The patient expressed understanding and agreed to proceed.  Subjective: PCP: Loman Brooklyn, FNP  Chief Complaint  Patient presents with  . URI   Patient complains of a productive cough with green sputum, chest congestion, sore throat and shortness of breath. Onset of symptoms was 1 day ago, gradually worsening since that time. He is drinking plenty of fluids. Evaluation to date: none. Treatment to date: none. He has a history of COPD. He does not smoke.    ROS: Per HPI  Current Outpatient Medications:  .  albuterol (PROVENTIL) (2.5 MG/3ML) 0.083% nebulizer solution, Take 3 mLs (2.5 mg total) by nebulization every 6 (six) hours as needed for wheezing or shortness of breath., Disp: 150 mL, Rfl: 1 .  budesonide-formoterol (SYMBICORT) 160-4.5 MCG/ACT inhaler, Inhale 2 puffs into the lungs daily., Disp: 3 Inhaler, Rfl: 2 .  diazepam (VALIUM) 2 MG tablet, Take 1 tablet (2 mg total) by mouth every 8 (eight) hours as needed (dizziness)., Disp: 30 tablet, Rfl: 0 .  ezetimibe (ZETIA) 10 MG tablet, TAKE 1 TABLET EVERY DAY (DISCONTINUE PRAVASTATIN), Disp: 90 tablet, Rfl: 3 .  meclizine (ANTIVERT) 25 MG tablet, Take 1 tablet (25 mg total) by mouth 3 (three) times daily as needed for dizziness., Disp: 30 tablet, Rfl: 2 .  methocarbamol (ROBAXIN) 500 MG tablet, Take 1  tablet (500 mg total) by mouth every 8 (eight) hours as needed for muscle spasms., Disp: 30 tablet, Rfl: 0 .  metoprolol tartrate (LOPRESSOR) 25 MG tablet, TAKE 1 AND 1/2 TABLETS TWICE DAILY ( NEW DOSE ), Disp: 270 tablet, Rfl: 1 .  traZODone (DESYREL) 50 MG tablet, Take 50 mg by mouth at bedtime., Disp: , Rfl:  .  Vitamin D, Ergocalciferol, (DRISDOL) 1.25 MG (50000 UNIT) CAPS capsule, Take 1 capsule (50,000 Units total) by mouth every 7 (seven) days., Disp: 12 capsule, Rfl: 2 .  warfarin (COUMADIN) 5 MG tablet, TAKE 1 TABLET DAILY EXCEPT TAKE 1/2 TABLET ON TUESDAYS AND THURSDAYS OR AS DIRECTD, Disp: 90 tablet, Rfl: 3  Allergies  Allergen Reactions  . Asa [Aspirin]     GI upset  . Niacin     REACTION: hot flashes   Past Medical History:  Diagnosis Date  . Anxiety disorder   . Benign prostatic hypertrophy   . Bilateral cataracts   . CAD (coronary artery disease)    Catheterization 2009, mild CAD  . Carotid artery disease ([place])    Doppler, September, 2011, less than 50% bilateral  . COPD (chronic obstructive pulmonary disease) ([place])   . CVA (cerebral infarction)    Hospitalization.  MRI...    Left anterior cerebral artery territory, September, 2011... Coumadin started  . DYSLIPIDEMIA 11/01/2009   Qualifier: Diagnosis of  By: Ron Parker, MD, Leonidas Romberg Dorinda Hill   . Ejection fraction    EF 60%  . Ejection fraction    EF 55-60%,  echo, September, 2011  . Gastroesophageal reflux disease   . Hyperlipidemia   . Persistent atrial fibrillation ([place])       . Ptosis of eyelid, left    Surgery in the past  . Sweating abnormality    Episodes of sweating appeared to be related to a delayed reaction to niacin  . Vitamin D insufficiency     Observations/Objective: A&O  No respiratory distress or wheezing audible over the phone Mood, judgement, and thought processes all WNL  Assessment and Plan: 1. COPD with acute exacerbation (HCC) - predniSONE (STERAPRED UNI-PAK 21 TAB) 10 MG (21) TBPK  tablet; As directed x 6 days  Dispense: 21 tablet; Refill: 0 - amoxicillin-clavulanate (AUGMENTIN) 875-125 MG tablet; Take 1 tablet by mouth 2 (two) times daily.  Dispense: 20 tablet; Refill: 0 - albuterol (PROVENTIL) (2.5 MG/3ML) 0.083% nebulizer solution; Take 3 mLs (2.5 mg total) by nebulization every 6 (six) hours as needed for wheezing or shortness of breath.  Dispense: 72 mL; Refill: 0   Follow Up Instructions:  I discussed the assessment and treatment plan with the patient. The patient was provided an opportunity to ask questions and all were answered. The patient agreed with the plan and demonstrated an understanding of the instructions.   The patient was advised to call back or seek an in-person evaluation if the symptoms worsen or if the condition fails to improve as anticipated.  The above assessment and management plan was discussed with the patient. The patient verbalized understanding of and has agreed to the management plan. Patient is aware to call the clinic if symptoms persist or worsen. Patient is aware when to return to the clinic for a follow-up visit. Patient educated on when it is appropriate to go to the emergency department.   Time call ended: 12:20 PM  I provided 19 minutes of non-face-to-face time during this encounter.  [name], MSN, APRN, FNP-C [facility] 08/19/19

## 2019-08-20 ENCOUNTER — Telehealth: Payer: Self-pay | Admitting: Family Medicine

## 2019-08-20 NOTE — Telephone Encounter (Signed)
Patient was given phone call for medication assistance through the health dept since he said they called him a month ago and never called him back.  Patient states he will call us if he needs anything from [place].

## 2019-09-07 ENCOUNTER — Ambulatory Visit (INDEPENDENT_AMBULATORY_CARE_PROVIDER_SITE_OTHER): Payer: Medicare Other | Admitting: *Deleted

## 2019-09-07 ENCOUNTER — Other Ambulatory Visit: Payer: Self-pay

## 2019-09-07 DIAGNOSIS — I482 Chronic atrial fibrillation, unspecified: Secondary | ICD-10-CM

## 2019-09-07 DIAGNOSIS — Z5181 Encounter for therapeutic drug level monitoring: Secondary | ICD-10-CM

## 2019-09-07 DIAGNOSIS — I4891 Unspecified atrial fibrillation: Secondary | ICD-10-CM | POA: Diagnosis not present

## 2019-09-07 LAB — POCT INR: INR: 3.3 — AB (ref 2.0–3.0)

## 2019-09-07 NOTE — Patient Instructions (Signed)
Hold warfarin tonight then resume 1 tablet daily except 1/2 tablet on Tuesdays and Thursdays Recheck in 6 weeks

## 2019-09-18 ENCOUNTER — Ambulatory Visit: Payer: Medicare Other | Admitting: Family Medicine

## 2019-09-18 ENCOUNTER — Encounter: Payer: Self-pay | Admitting: Cardiology

## 2019-09-18 ENCOUNTER — Ambulatory Visit (INDEPENDENT_AMBULATORY_CARE_PROVIDER_SITE_OTHER): Payer: Medicare Other | Admitting: Cardiology

## 2019-09-18 VITALS — BP 132/78 | HR 88 | Ht 66.0 in | Wt 171.2 lb

## 2019-09-18 DIAGNOSIS — I351 Nonrheumatic aortic (valve) insufficiency: Secondary | ICD-10-CM

## 2019-09-18 DIAGNOSIS — I482 Chronic atrial fibrillation, unspecified: Secondary | ICD-10-CM | POA: Diagnosis not present

## 2019-09-18 DIAGNOSIS — E782 Mixed hyperlipidemia: Secondary | ICD-10-CM | POA: Diagnosis not present

## 2019-09-18 DIAGNOSIS — I2510 Atherosclerotic heart disease of native coronary artery without angina pectoris: Secondary | ICD-10-CM

## 2019-09-18 NOTE — Patient Instructions (Signed)

## 2019-09-18 NOTE — Progress Notes (Signed)
Clinical Summary [name] is a 79 y.o.male seen today for follow up of the following medical problems.   1. Chronic afib - DOACs were too expensive in the past, he has elected to remain on coumadin. - recent admission with a febrile illness, rigors at [facility]. Caused his afib rates to elevate in the hospital, lopressor was increased to 100mg  bid. Took a few days after discharge, but starting causing significant fatigue. - he lowered lopressor back to 50mg  bid. Symptoms have improved.  - seen by EP, did not meet criteria to consider watchman device    - no palpitations - compliant with meds. No bleeding on coumadin  2. Carotid stenosis - 02/2015 carotid US: bilateral 1-39% ICA disease.  3. Hyperlipidemia - joint pains on simvaststin, atorvastatin, rosuvastatin, pravastatin. - he is compliant with zetia - labs followed by pcp   4. CAD - mild disease by cath 2009 - no symptoms.   5. LE edema -reports singificant bilateral leg edema over the last few weeks. Denies any SOB or orthopnea. Weight is up 6 lbs since 04/2018. - Jan 2020 LVEF [id number] eval diastolic function due to afib but sever biatrial enlargements is suggestive.   - no recent edema. No longer taking lasix, caused him to urinate too much. Previously was taking just prn, without edema has done ok   6. Aortic regurgitation - moderate AI by echo Jan 2020 - denies any symptoms  7. 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 Often goes to [employer]   Past Medical History:  Diagnosis Date  . Anxiety disorder   . Benign prostatic hypertrophy   . Bilateral cataracts   . CAD (coronary artery disease)    Catheterization 2009, mild CAD  . Carotid artery disease ([place])    Doppler, September, 2011, less than 50% bilateral  . COPD (chronic obstructive pulmonary disease) ([place])   . CVA (cerebral infarction)    Hospitalization.  MRI...    Left anterior cerebral artery territory, September, 2011... Coumadin started  . DYSLIPIDEMIA 11/01/2009   Qualifier: Diagnosis of  By: Ron Parker, MD, Leonidas Romberg Dorinda Hill   . Ejection fraction    EF 60%  . Ejection fraction    EF 55-60%, echo, September, 2011  . Gastroesophageal reflux disease   . Hyperlipidemia   . Persistent atrial fibrillation ([place])       . Ptosis of eyelid, left    Surgery in the past  . Sweating abnormality    Episodes of sweating appeared to be related to a delayed reaction to niacin  . Vitamin D insufficiency      Allergies  Allergen Reactions  . Asa [Aspirin]     GI upset  . Niacin     REACTION: hot flashes     Current Outpatient Medications  Medication Sig Dispense Refill  . albuterol (PROVENTIL) (2.5 MG/3ML) 0.083% nebulizer solution Take 3 mLs (2.5 mg total) by nebulization every 6 (six) hours as needed for wheezing or shortness of breath. 72 mL 0  . amoxicillin-clavulanate (AUGMENTIN) 875-125 MG tablet Take 1 tablet by mouth 2 (two) times daily. 20 tablet 0  . budesonide-formoterol (SYMBICORT) 160-4.5 MCG/ACT inhaler Inhale 2 puffs into the lungs daily. 3 Inhaler 2  . diazepam (VALIUM) 2 MG tablet Take 1 tablet (2 mg total) by mouth every 8 (eight) hours as needed (dizziness). 30 tablet 0  . ezetimibe (ZETIA) 10 MG tablet TAKE 1 TABLET EVERY  DAY (DISCONTINUE PRAVASTATIN) 90 tablet 3  . meclizine (ANTIVERT) 25 MG tablet Take 1 tablet (25 mg total) by mouth 3 (three) times daily as needed for dizziness. 30 tablet 2  . methocarbamol (ROBAXIN) 500 MG tablet Take 1 tablet (500 mg total) by mouth every 8 (eight) hours as needed for muscle spasms. 30 tablet 0  . metoprolol tartrate (LOPRESSOR) 25 MG tablet TAKE 1 AND 1/2 TABLETS TWICE DAILY ( NEW DOSE ) 270 tablet 1  . predniSONE (STERAPRED UNI-PAK 21 TAB) 10 MG (21) TBPK tablet As directed x 6 days 21 tablet 0  . traZODone (DESYREL) 50 MG tablet Take 50 mg by mouth at bedtime.    .  Vitamin D, Ergocalciferol, (DRISDOL) 1.25 MG (50000 UNIT) CAPS capsule Take 1 capsule (50,000 Units total) by mouth every 7 (seven) days. 12 capsule 2  . warfarin (COUMADIN) 5 MG tablet TAKE 1 TABLET DAILY EXCEPT TAKE 1/2 TABLET ON TUESDAYS AND THURSDAYS OR AS DIRECTD 90 tablet 3   No current facility-administered medications for this visit.     Past Surgical History:  Procedure Laterality Date  . APPENDECTOMY    . CATARACT EXTRACTION, BILATERAL    . KNEE SURGERY Right 2019   torn ligament?   . TRANSURETHRAL RESECTION OF PROSTATE N/A 08/19/2018   Procedure: TRANSURETHRAL RESECTION OF THE PROSTATE (TURP) OF PROSTATE ABCESS;  Surgeon: Alexis Frock, MD;  Location: [facility];  Service: Urology;  Laterality: N/A;     Allergies  Allergen Reactions  . Asa [Aspirin]     GI upset  . Niacin     REACTION: hot flashes      Family History  Problem Relation Age of Onset  . Heart attack Mother   . Other Other        Insignificant for premature CAD  . Heart disease Sister   . Hyperlipidemia Sister   . Kidney disease Son   . Heart attack Paternal Grandfather      Social History [name] reports that he quit smoking about 29 years ago. His smoking use included cigarettes. He has a 150.00 pack-year smoking history. He quit smokeless tobacco use about 19 years ago.  His smokeless tobacco use included chew. [name] reports no history of alcohol use.   Review of Systems CONSTITUTIONAL: No weight loss, fever, chills, weakness or fatigue.  HEENT: Eyes: No visual loss, blurred vision, double vision or yellow sclerae.No hearing loss, sneezing, congestion, runny nose or sore throat.  SKIN: No rash or itching.  CARDIOVASCULAR: per hpi RESPIRATORY: No shortness of breath, cough or sputum.  GASTROINTESTINAL: No anorexia, nausea, vomiting or diarrhea. No abdominal pain or blood.  GENITOURINARY: No burning on urination, no polyuria NEUROLOGICAL: No headache, dizziness, syncope, paralysis,  ataxia, numbness or tingling in the extremities. No change in bowel or bladder control.  MUSCULOSKELETAL: No muscle, back pain, joint pain or stiffness.  LYMPHATICS: No enlarged nodes. No history of splenectomy.  PSYCHIATRIC: No history of depression or anxiety.  ENDOCRINOLOGIC: No reports of sweating, cold or heat intolerance. No polyuria or polydipsia.  Marland Kitchen   Physical Examination Today's Vitals   09/18/19 0927  BP: 132/78  Pulse: 88  SpO2: 94%  Weight: 171 lb 3.2 oz (77.7 kg)  Height: 5\' 6"  (1.676 m)   Body mass index is 27.63 kg/m.  Gen: resting comfortably, no acute distress HEENT: no scleral icterus, pupils equal round and reactive, no palptable cervical adenopathy,  CV: irreg, no m/rg, no jvd Resp: Clear to auscultation bilaterally GI:  abdomen is soft, non-tender, non-distended, normal bowel sounds, no hepatosplenomegaly MSK: extremities are warm, no edema.  Skin: warm, no rash Neuro:  no focal deficits Psych: appropriate affect   Diagnostic Studies 02/2015 carotid US: bilateral 1-39% ICA disease. Recs for f/u 2 years  02/2008 cath FINDINGS: The aortic pressure 98/71 with a mean of 83, left ventricular pressure 99/20.  Coronary angiography. The left mainstem is very short. There is no stenosis present.  The LAD is a large-caliber vessel that courses down and reaches the LV apex. The anatomy is a bit unusual as there is a Porche Steinberger off the proximal LAD that runs the course of an intermediate. It supplies multiple Arby Dahir vessels to the lateral wall. There has been more of a traditional appearing diagonal Suha Schoenbeck from further down in the mid-LAD. The LAD at the origin of the second diagonal Camrie Stock has a 30% stenosis. Further down in the vessel, there is minor nonobstructive plaque but no significant stenosis in the LAD or its Ermie Glendenning vessels. There are two diagonals in the intermediate territory Jenel Gierke but all arising from the proximal and mid  LAD.  The left circumflex is very small. It courses down the AV groove and supplies no significant Rigoberto Repass vessels.  Right coronary artery. The right coronary artery is dominant. Proximally, there is an eccentric 50% stenosis. This is a smooth appearing lesion of the vessels. The midportion of the right coronary artery is dilated. There is a transition zone into more normal- appearing vessel where there is a 30% stenosis. There is a large acute marginal Embry Manrique and a moderate size PDA and posterolateral Lynsie Mcwatters. There are no other significant stenoses noted. With intracoronary nitroglycerin, the lesion still appears no worse than 50%.  Left ventriculography shows low normal LV function with an LVEF in the range of 50-55%. There was no significant mitral regurgitation.  ASSESSMENT: 1. Mild-to-moderate right coronary artery stenosis as outlined above. 2. Minimal left anterior descending and left circumflex stenosis. 3. Normal left ventricular function.  Post cat PLAN: Recommend medical therapy for nonobstructive CAD. The patient will resume Coumadin tonight. He will drop his aspirin dose to 81 mg. In reviewing Dr. Ron Parker notes, the patient may not require long-term Coumadin. Recommend aggressive secondary risk reduction for the patient's diffuse nonobstructive CAD.   08/2016 event monitor  Telemetry tracings show rate controlled atrial fibrillation  No symptoms reported    Assessment and Plan   1. Chronic afib - DOACs have been too expensive, he prefers staying on coumadin - no symptoms, he will continue current meds  2. CAD - mild disease on prior cath -no symptoms, continue to monitor  3. HYperlipidemia -intolerant to multiple statins, he will continhe his zetia - labs followed by pcp   4. LE edema/chronic diatolic HF.  - doing well, he is no longer on lasix due to too frequent urination, doing ok without it  5. Aortic  regurgitation - moderate by Jan 2020 echo - no symptoms, likely repeat echo next year     Arnoldo Lenis, M.D.

## 2019-09-21 ENCOUNTER — Ambulatory Visit: Payer: Medicare Other | Admitting: Family Medicine

## 2019-09-24 NOTE — Progress Notes (Signed)
Subjective:  1. Hematuria, unspecified type   2. Benign localized prostatic hyperplasia with lower urinary tract symptoms (LUTS)   3. Urinary urgency      I have an enlarged prostate (follow-up). HPI: [name] is a 79 year-old male established patient who is here for an enlarged prostate follow-up evaluation.   05/01/19: [name] has had some urgency with near UUI since the TURP for a prostatic abscess on 08/19/18: He has had a couple of episodes of blood spotting in the shorts. He has been having issues with vertigo and wakes up every day quite stiff. His UA today has 1+ blood and his IPSS is 15. He is off of tamsulosin but remains on finasteride. He is on warfarin and his INR was 3.2 at last check so his dose was reduced.   11/12/2018: He is doing well since last visit on flomax and finasteride.   09/25/19: [name] was taken off of finasteride at his last visit and has also stopped the tamsulosin.  He is voiding well with an IPSS of 5.  His only complaint is dizziness.  He has held his cholesterol med since it lists dizziness as a side effects.  He has had no hematuria or dysuria.   .     IPSS    Row Name 09/25/19 1100         International Prostate Symptom Score   How often have you had the sensation of not emptying your bladder?  Less than 1 in 5     How often have you had to urinate less than every two hours?  Less than 1 in 5 times     How often have you found you stopped and started again several times when you urinated?  Not at All     How often have you found it difficult to postpone urination?  Less than 1 in 5 times     How often have you had a weak urinary stream?  Less than 1 in 5 times     How often have you had to strain to start urination?  Not at All     How many times did you typically get up at night to urinate?  1 Time     Total IPSS Score  5       Quality of Life due to urinary symptoms   If you were to spend the rest of your life with your urinary  condition just the way it is now how would you feel about that?  Delighted         ROS:  ROS:  A complete review of systems was performed.  All systems are negative except for pertinent findings as noted.   ROS  Allergies  Allergen Reactions  . Asa [Aspirin]     GI upset  . Niacin     REACTION: hot flashes    Outpatient Encounter Medications as of 09/25/2019  Medication Sig Note  . albuterol (PROVENTIL) (2.5 MG/3ML) 0.083% nebulizer solution Take 3 mLs (2.5 mg total) by nebulization every 6 (six) hours as needed for wheezing or shortness of breath.   . budesonide-formoterol (SYMBICORT) 160-4.5 MCG/ACT inhaler Inhale 2 puffs into the lungs daily.   . diazepam (VALIUM) 2 MG tablet Take 1 tablet (2 mg total) by mouth every 8 (eight) hours as needed (dizziness).   . ezetimibe (ZETIA) 10 MG tablet TAKE 1 TABLET EVERY DAY (DISCONTINUE PRAVASTATIN) 09/18/2019: Thinks this is causing dizziness.   . meclizine (ANTIVERT)  25 MG tablet Take 1 tablet (25 mg total) by mouth 3 (three) times daily as needed for dizziness.   . methocarbamol (ROBAXIN) 500 MG tablet Take 1 tablet (500 mg total) by mouth every 8 (eight) hours as needed for muscle spasms.   . metoprolol tartrate (LOPRESSOR) 25 MG tablet TAKE 1 AND 1/2 TABLETS TWICE DAILY ( NEW DOSE )   . traZODone (DESYREL) 50 MG tablet Take 50 mg by mouth at bedtime.   . Vitamin D, Ergocalciferol, (DRISDOL) 1.25 MG (50000 UNIT) CAPS capsule Take 1 capsule (50,000 Units total) by mouth every 7 (seven) days.   Marland Kitchen warfarin (COUMADIN) 5 MG tablet TAKE 1 TABLET DAILY EXCEPT TAKE 1/2 TABLET ON TUESDAYS AND THURSDAYS OR AS DIRECTD    No facility-administered encounter medications on file as of 09/25/2019.    Past Medical History:  Diagnosis Date  . Anxiety disorder   . Benign prostatic hypertrophy   . Bilateral cataracts   . CAD (coronary artery disease)    Catheterization 2009, mild CAD  . Carotid artery disease ([place])    Doppler, September, 2011, less than  50% bilateral  . COPD (chronic obstructive pulmonary disease) ([place])   . CVA (cerebral infarction)    Hospitalization.  MRI...    Left anterior cerebral artery territory, September, 2011... Coumadin started  . DYSLIPIDEMIA 11/01/2009   Qualifier: Diagnosis of  By: Ron Parker, MD, Leonidas Romberg Dorinda Hill   . Ejection fraction    EF 60%  . Ejection fraction    EF 55-60%, echo, September, 2011  . Gastroesophageal reflux disease   . Hyperlipidemia   . Persistent atrial fibrillation ([place])       . Ptosis of eyelid, left    Surgery in the past  . Sweating abnormality    Episodes of sweating appeared to be related to a delayed reaction to niacin  . Vitamin D insufficiency     Past Surgical History:  Procedure Laterality Date  . APPENDECTOMY    . CATARACT EXTRACTION, BILATERAL    . KNEE SURGERY Right 2019   torn ligament?   . TRANSURETHRAL RESECTION OF PROSTATE N/A 08/19/2018   Procedure: TRANSURETHRAL RESECTION OF THE PROSTATE (TURP) OF PROSTATE ABCESS;  Surgeon: Alexis Frock, MD;  Location: [facility];  Service: Urology;  Laterality: N/A;    Social History   Socioeconomic History  . Marital status: Married    Spouse name: shirley  . Number of children: 5  . Years of education: 53  . Highest education level: 11th grade  Occupational History  . Occupation: Retired Statistician   Tobacco Use  . Smoking status: Former Smoker    Packs/day: 3.00    Years: 50.00    Pack years: 150.00    Types: Cigarettes    Quit date: 03/18/1990    Years since quitting: 29.5  . Smokeless tobacco: Former Systems developer    Types: [place] date: 06/18/2000  Substance and Sexual Activity  . Alcohol use: No    Alcohol/week: 0.0 standard drinks  . Drug use: No  . Sexual activity: Not Currently  Other Topics Concern  . Not on file  Social History Narrative   Quit smoking in 1993. No significant alcohol abuse. Retired from trucking.    Social Determinants of Health   Financial Resource Strain: Low Risk   .  Difficulty of Paying Living Expenses: Not hard at all  Food Insecurity: No Food Insecurity  . Worried About Charity fundraiser in the Last Year:  Never true  . Ran Out of Food in the Last Year: Never true  Transportation Needs: No Transportation Needs  . Lack of Transportation (Medical): No  . Lack of Transportation (Non-Medical): No  Physical Activity: Insufficiently Active  . Days of Exercise per Week: 7 days  . Minutes of Exercise per Session: 20 min  Stress: No Stress Concern Present  . Feeling of Stress : Not at all  Social Connections: Not Isolated  . Frequency of Communication with Friends and Family: More than three times a week  . Frequency of Social Gatherings with Friends and Family: More than three times a week  . Attends Religious Services: More than 4 times per year  . Active Member of Clubs or Organizations: Yes  . Attends Archivist Meetings: More than 4 times per year  . Marital Status: Married  Human resources officer Violence: Not At Risk  . Fear of Current or Ex-Partner: No  . Emotionally Abused: No  . Physically Abused: No  . Sexually Abused: No    Family History  Problem Relation Age of Onset  . Heart attack Mother   . Other Other        Insignificant for premature CAD  . Heart disease Sister   . Hyperlipidemia Sister   . Kidney disease Son   . Heart attack Paternal Grandfather        Objective: Vitals:   09/25/19 1044  BP: 130/70  Pulse: 62  Temp: 97.9 F (36.6 C)     Physical Exam  Lab Results:  Results for orders placed or performed in visit on 09/25/19 (from the past 24 hour(s))  POCT urinalysis dipstick     Status: Abnormal   Collection Time: 09/25/19 10:52 AM  Result Value Ref Range   Color, UA dark yellow    Clarity, UA     Glucose, UA Negative Negative   Bilirubin, UA neg    Ketones, UA neg    Spec Grav, UA >=1.030 (A) 1.010 - 1.025   Blood, UA +    pH, UA 5.0 5.0 - 8.0   Protein, UA Positive (A) Negative    Urobilinogen, UA 0.2 0.2 or 1.0 E.U./dL   Nitrite, UA neg    Leukocytes, UA Negative Negative   Appearance clear    Odor      BMET No results for input(s): NA, K, CL, CO2, GLUCOSE, BUN, CREATININE, CALCIUM in the last 72 hours. PSA No results found for: PSA No results found for: TESTOSTERONE    Studies/Results: No results found.    Assessment & Plan: BPH with BOO with mild LUTS off of all meds.  He will return in a year.  Microhematuria.  He has 1+ blood on the UA and it will be sent for micro.  History of prostate abscess.   No evidence of recurrence.    No orders of the defined types were placed in this encounter.    Orders Placed This Encounter  Procedures  . POCT urinalysis dipstick      Return in about 1 year (around 09/24/2020).   CC: Loman Brooklyn, FNP      Irine Seal 09/25/2019

## 2019-09-25 ENCOUNTER — Other Ambulatory Visit (HOSPITAL_COMMUNITY)
Admission: RE | Admit: 2019-09-25 | Discharge: 2019-09-25 | Disposition: A | Discharge status: Home / Self Care | Payer: Medicare Other | Source: Other Acute Inpatient Hospital | Attending: Urology | Admitting: Urology

## 2019-09-25 ENCOUNTER — Ambulatory Visit (INDEPENDENT_AMBULATORY_CARE_PROVIDER_SITE_OTHER): Payer: Medicare Other | Admitting: Urology

## 2019-09-25 ENCOUNTER — Other Ambulatory Visit: Payer: Self-pay

## 2019-09-25 ENCOUNTER — Encounter: Payer: Self-pay | Admitting: Urology

## 2019-09-25 VITALS — BP 130/70 | HR 62 | Temp 97.9°F | Ht 66.0 in | Wt 170.0 lb

## 2019-09-25 DIAGNOSIS — R319 Hematuria, unspecified: Secondary | ICD-10-CM | POA: Insufficient documentation

## 2019-09-25 DIAGNOSIS — I2510 Atherosclerotic heart disease of native coronary artery without angina pectoris: Secondary | ICD-10-CM

## 2019-09-25 DIAGNOSIS — R3915 Urgency of urination: Secondary | ICD-10-CM | POA: Diagnosis not present

## 2019-09-25 DIAGNOSIS — N401 Enlarged prostate with lower urinary tract symptoms: Secondary | ICD-10-CM

## 2019-09-25 LAB — POCT URINALYSIS DIPSTICK
Bilirubin, UA: NEGATIVE
Glucose, UA: NEGATIVE
Ketones, UA: NEGATIVE
Leukocytes, UA: NEGATIVE
Nitrite, UA: NEGATIVE
Protein, UA: POSITIVE — AB
Spec Grav, UA: >=1.03 — AB (ref 1.010–1.025)
Urobilinogen, UA: 0.2 E.U./dL
pH, UA: 5 (ref 5.0–8.0)

## 2019-09-25 LAB — URINALYSIS, ROUTINE W REFLEX MICROSCOPIC
Bilirubin Urine: NEGATIVE
Glucose, UA: NEGATIVE mg/dL
Hgb urine dipstick: NEGATIVE
Ketones, ur: NEGATIVE mg/dL
Leukocytes,Ua: NEGATIVE
Nitrite: NEGATIVE
Protein, ur: NEGATIVE mg/dL
Specific Gravity, Urine: 1.027 (ref 1.005–1.030)
pH: 5 (ref 5.0–8.0)

## 2019-10-19 ENCOUNTER — Ambulatory Visit (INDEPENDENT_AMBULATORY_CARE_PROVIDER_SITE_OTHER): Payer: Medicare Other | Admitting: *Deleted

## 2019-10-19 ENCOUNTER — Other Ambulatory Visit: Payer: Self-pay

## 2019-10-19 DIAGNOSIS — I4891 Unspecified atrial fibrillation: Secondary | ICD-10-CM | POA: Diagnosis not present

## 2019-10-19 DIAGNOSIS — I482 Chronic atrial fibrillation, unspecified: Secondary | ICD-10-CM | POA: Diagnosis not present

## 2019-10-19 DIAGNOSIS — Z5181 Encounter for therapeutic drug level monitoring: Secondary | ICD-10-CM | POA: Diagnosis not present

## 2019-10-19 LAB — POCT INR: INR: 2.4 (ref 2.0–3.0)

## 2019-10-19 NOTE — Patient Instructions (Signed)
Continue warfarin 1 tablet daily except 1/2 tablet on Tuesdays and Thursdays Recheck in 6 weeks 

## 2019-10-30 ENCOUNTER — Telehealth: Payer: Self-pay | Admitting: Urology

## 2019-10-30 NOTE — Telephone Encounter (Signed)
Patient called having issues and wanted asap appt with Dr Jeffie Pollock. He asked that a nurse call to discuss his issue.

## 2019-10-30 NOTE — Telephone Encounter (Signed)
C/o generalized fatigue and increasing daily. Pt has appt with pcp on May 21st Pt had covid vaccine may 11th. Encouraged pt to reach out to pcp concerning symptoms. Pt voiced understanding.

## 2019-11-06 ENCOUNTER — Ambulatory Visit (INDEPENDENT_AMBULATORY_CARE_PROVIDER_SITE_OTHER): Payer: Medicare Other | Admitting: Family Medicine

## 2019-11-06 ENCOUNTER — Encounter: Payer: Self-pay | Admitting: Family Medicine

## 2019-11-06 ENCOUNTER — Other Ambulatory Visit: Payer: Self-pay

## 2019-11-06 VITALS — BP 142/79 | HR 83 | Temp 97.8°F | Ht 66.0 in | Wt 172.4 lb

## 2019-11-06 DIAGNOSIS — J449 Chronic obstructive pulmonary disease, unspecified: Secondary | ICD-10-CM

## 2019-11-06 DIAGNOSIS — N401 Enlarged prostate with lower urinary tract symptoms: Secondary | ICD-10-CM

## 2019-11-06 DIAGNOSIS — R42 Dizziness and giddiness: Secondary | ICD-10-CM

## 2019-11-06 DIAGNOSIS — N182 Chronic kidney disease, stage 2 (mild): Secondary | ICD-10-CM

## 2019-11-06 DIAGNOSIS — N412 Abscess of prostate: Secondary | ICD-10-CM | POA: Diagnosis not present

## 2019-11-06 DIAGNOSIS — E559 Vitamin D deficiency, unspecified: Secondary | ICD-10-CM

## 2019-11-06 DIAGNOSIS — R5383 Other fatigue: Secondary | ICD-10-CM | POA: Diagnosis not present

## 2019-11-06 MED ORDER — OMEPRAZOLE 20 MG PO CPDR: 20.0000 mg | DELAYED_RELEASE_CAPSULE | Freq: Every day | ORAL | 1 refills | Status: DC

## 2019-11-06 MED ORDER — ALBUTEROL SULFATE (2.5 MG/3ML) 0.083% IN NEBU: 2.5000 mg | INHALATION_SOLUTION | Freq: Four times a day (QID) | RESPIRATORY_TRACT | 0 refills | Status: DC | PRN

## 2019-11-06 MED ORDER — VITAMIN D (ERGOCALCIFEROL) 1.25 MG (50000 UNIT) PO CAPS: 50000.0000 [IU] | ORAL_CAPSULE | ORAL | 2 refills | Status: DC

## 2019-11-06 NOTE — Progress Notes (Addendum)
Assessment & Plan:  1. 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 Sample of Breztri given to patient to try. Advised to use 2 puffs BID. LOT [id number] O24. EXP [id number]. Also discussed his Symbicort should be BID; he is the one that only uses it QD.  - albuterol (PROVENTIL) (2.5 MG/3ML) 0.083% nebulizer solution; Take 3 mLs (2.5 mg total) by nebulization every 6 (six) hours as needed for wheezing or shortness of breath.  Dispense: 72 mL; Refill: 0  2-4. Fatigue, unspecified type/Prostate abscess/Benign prostatic hyperplasia with lower urinary tract symptoms, symptom details unspecified  - I have messaged patient's urologist to see if he recommends proceeding with CT scan to see if patient has another abscess.  - CBC with Differential/Platelet; Future - Testosterone,Free and Total; Future - PSA, total and free; Future  5. 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 He prefers going back to the meclizine since his episodes are not as bad or frequent as they were previously.   6. Vitamin D insufficiency - Labs today to assess.  - Vitamin D, Ergocalciferol, (DRISDOL) 1.25 MG (50000 UNIT) CAPS capsule; Take 1 capsule (50,000 Units total) by mouth every 7 (seven) days.  Dispense: 12 capsule; Refill: 2 - VITAMIN D 25 Hydroxy (Vit-D Deficiency, Fractures); Future  7. Chronic kidney disease (CKD), stage II (mild) - CMP14+EGFR; Future    ADDENDUM 11/10/2019: Dr. Jeffie Pollock recommended UA/C&S and CT pelvis with IV contrast. These orders have been placed.    Return next week with Almyra Free, for COPD; 3 months with me for routine f/u.  [name], MSN, APRN, FNP-C [facility]  Subjective:    Patient ID: [name], male    DOB: July 08, 1940, 79 y.o.   MRN: [id number]  Patient Care Team: Loman Brooklyn, FNP as PCP - General (Family Medicine) Harl Bowie Alphonse Guild, MD as PCP - Cardiology (Cardiology) Harl Bowie Alphonse Guild,  MD as Consulting Physician (Cardiology) Irine Seal, MD as Attending Physician (Urology)   Chief Complaint:  Chief Complaint  Patient presents with  . COPD    3 month follow up of chronic medical conditions  . Fatigue    Patient states it has been going on x 4 months.    HPI: [name] is a 79 y.o. male presenting on 11/06/2019 for COPD (3 month follow up of chronic medical conditions) and Fatigue (Patient states it has been going on x 4 months.)  Patient reports he has been having to use more breathing treatments in the past 3 weeks.  They are helpful when he uses them.  He currently has a prescription for Symbicort, which is too expensive.  Most recently he paid $491 for two inhalers.  I did previously fill out the form to get him prescription assistance, but he has had some trouble talking with them.  When he initially spoke to them he was given a number to call back and when he did a man answer the phone and yelled at him to never call that number again.  He has also talked to social security and was told he gets too much.  He did have a missed call from Hilda Blades but is not sure how to get back in touch with her from the previous incident.  He did get a letter from the social security administration, but this was sent to the scan center and has not made it into his chart yet for [place] to fax  to [place], as we were unaware that is what we were supposed to be doing with it.  Patient is concerned that he is feeling fatigued with no energy again.  His wife reports he is shuffling his feet when he first gets up again.  The last time he was feeling and walking this way he had an abscess on his prostate that led to him being septic.  He does report that if he is sitting on the couch watching TV he will sometimes just fall asleep.  His wife denies him snoring or stopping breathing during the night.  Patient is rarely having to take anything for his vertigo.  He states that at most he has to take  something once every 2 weeks and his episodes are not nearly as bad as they were.  Patient is scheduled to have left eye surgery on 11/19/2019.  Social history:  Relevant past medical, surgical, family and social history reviewed and updated as indicated. Interim medical history since our last visit reviewed.  Allergies and medications reviewed and updated.  DATA REVIEWED: CHART IN EPIC  ROS: Negative unless specifically indicated above in HPI.    Current Outpatient Medications:  .  albuterol (PROVENTIL) (2.5 MG/3ML) 0.083% nebulizer solution, Take 3 mLs (2.5 mg total) by nebulization every 6 (six) hours as needed for wheezing or shortness of breath., Disp: 72 mL, Rfl: 0 .  budesonide-formoterol (SYMBICORT) 160-4.5 MCG/ACT inhaler, Inhale 2 puffs into the lungs daily., Disp: 3 Inhaler, Rfl: 2 .  diazepam (VALIUM) 2 MG tablet, Take 1 tablet (2 mg total) by mouth every 8 (eight) hours as needed (dizziness)., Disp: 30 tablet, Rfl: 0 .  ezetimibe (ZETIA) 10 MG tablet, TAKE 1 TABLET EVERY DAY (DISCONTINUE PRAVASTATIN), Disp: 90 tablet, Rfl: 3 .  meclizine (ANTIVERT) 25 MG tablet, Take 1 tablet (25 mg total) by mouth 3 (three) times daily as needed for dizziness., Disp: 30 tablet, Rfl: 2 .  methocarbamol (ROBAXIN) 500 MG tablet, Take 1 tablet (500 mg total) by mouth every 8 (eight) hours as needed for muscle spasms., Disp: 30 tablet, Rfl: 0 .  metoprolol tartrate (LOPRESSOR) 25 MG tablet, TAKE 1 AND 1/2 TABLETS TWICE DAILY ( NEW DOSE ), Disp: 270 tablet, Rfl: 1 .  omeprazole (PRILOSEC) 20 MG capsule, Take 1 capsule (20 mg total) by mouth daily., Disp: 90 capsule, Rfl: 1 .  traZODone (DESYREL) 50 MG tablet, Take 50 mg by mouth at bedtime., Disp: , Rfl:  .  Vitamin D, Ergocalciferol, (DRISDOL) 1.25 MG (50000 UNIT) CAPS capsule, Take 1 capsule (50,000 Units total) by mouth every 7 (seven) days., Disp: 12 capsule, Rfl: 2 .  warfarin (COUMADIN) 5 MG tablet, TAKE 1 TABLET DAILY EXCEPT TAKE 1/2 TABLET ON  TUESDAYS AND THURSDAYS OR AS DIRECTD, Disp: 90 tablet, Rfl: 3   Allergies  Allergen Reactions  . Asa [Aspirin]     GI upset  . Niacin     REACTION: hot flashes   Past Medical History:  Diagnosis Date  . Anxiety disorder   . Benign prostatic hypertrophy   . Bilateral cataracts   . CAD (coronary artery disease)    Catheterization 2009, mild CAD  . Carotid artery disease ([place])    Doppler, September, 2011, less than 50% bilateral  . COPD (chronic obstructive pulmonary disease) ([place])   . CVA (cerebral infarction)    Hospitalization.  MRI...    Left anterior cerebral artery territory, September, 2011... Coumadin started  . DYSLIPIDEMIA 11/01/2009   Qualifier: Diagnosis of  ByRon Parker, MD, [facility], [place]   . Ejection fraction    EF 60%  . Ejection fraction    EF 55-60%, echo, September, 2011  . Gastroesophageal reflux disease   . Hyperlipidemia   . Persistent atrial fibrillation ([place])       . Ptosis of eyelid, left    Surgery in the past  . Sweating abnormality    Episodes of sweating appeared to be related to a delayed reaction to niacin  . Vitamin D insufficiency     Past Surgical History:  Procedure Laterality Date  . APPENDECTOMY    . CATARACT EXTRACTION, BILATERAL    . KNEE SURGERY Right 2019   torn ligament?   . TRANSURETHRAL RESECTION OF PROSTATE N/A 08/19/2018   Procedure: TRANSURETHRAL RESECTION OF THE PROSTATE (TURP) OF PROSTATE ABCESS;  Surgeon: Alexis Frock, MD;  Location: [facility];  Service: Urology;  Laterality: N/A;    Social History   Socioeconomic History  . Marital status: Married    Spouse name: shirley  . Number of children: 5  . Years of education: 97  . Highest education level: 11th grade  Occupational History  . Occupation: Retired Statistician   Tobacco Use  . Smoking status: Former Smoker    Packs/day: 3.00    Years: 50.00    Pack years: 150.00    Types: Cigarettes    Quit date: 03/18/1990    Years since quitting: 29.6  . Smokeless  tobacco: Former Systems developer    Types: [place] date: 06/18/2000  Substance and Sexual Activity  . Alcohol use: No    Alcohol/week: 0.0 standard drinks  . Drug use: No  . Sexual activity: Not Currently  Other Topics Concern  . Not on file  Social History Narrative   Quit smoking in 1993. No significant alcohol abuse. Retired from trucking.    Social Determinants of Health   Financial Resource Strain: Low Risk   . Difficulty of Paying Living Expenses: Not hard at all  Food Insecurity: No Food Insecurity  . Worried About Charity fundraiser in the Last Year: Never true  . Ran Out of Food in the Last Year: Never true  Transportation Needs: No Transportation Needs  . Lack of Transportation (Medical): No  . Lack of Transportation (Non-Medical): No  Physical Activity: Insufficiently Active  . Days of Exercise per Week: 7 days  . Minutes of Exercise per Session: 20 min  Stress: No Stress Concern Present  . Feeling of Stress : Not at all  Social Connections: Not Isolated  . Frequency of Communication with Friends and Family: More than three times a week  . Frequency of Social Gatherings with Friends and Family: More than three times a week  . Attends Religious Services: More than 4 times per year  . Active Member of Clubs or Organizations: Yes  . Attends Archivist Meetings: More than 4 times per year  . Marital Status: Married  Human resources officer Violence: Not At Risk  . Fear of Current or Ex-Partner: No  . Emotionally Abused: No  . Physically Abused: No  . Sexually Abused: No        Objective:    BP (!) 142/79   Pulse 83   Temp 97.8 F (36.6 C) (Temporal)   Ht 5' 6"  (1.676 m)   Wt 172 lb 6.4 oz (78.2 kg)   SpO2 93%   BMI 27.83 kg/m   Wt Readings from Last 3 Encounters:  11/06/19 172 lb 6.4 oz (78.2 kg)  09/25/19 170 lb (77.1 kg)  09/18/19 171 lb 3.2 oz (77.7 kg)    Physical Exam Vitals reviewed.  Constitutional:      General: He is not in acute distress.     Appearance: Normal appearance. He is overweight. He is not ill-appearing, toxic-appearing or diaphoretic.  HENT:     Head: Normocephalic and atraumatic.  Eyes:     General: No scleral icterus.       Right eye: No discharge.        Left eye: No discharge.     Conjunctiva/sclera: Conjunctivae normal.  Cardiovascular:     Rate and Rhythm: Normal rate. Rhythm irregular.     Heart sounds: Normal heart sounds. No murmur. No friction rub. No gallop.   Pulmonary:     Effort: Pulmonary effort is normal. No respiratory distress.     Breath sounds: Normal breath sounds. No stridor. No wheezing, rhonchi or rales.  Musculoskeletal:        General: Normal range of motion.     Cervical back: Normal range of motion.  Skin:    General: Skin is warm and dry.  Neurological:     Mental Status: He is alert and oriented to person, place, and time. Mental status is at baseline.  Psychiatric:        Mood and Affect: Mood normal.        Behavior: Behavior normal.        Thought Content: Thought content normal.        Judgment: Judgment normal.     Lab Results  Component Value Date   TSH 4.360 04/07/2019   Lab Results  Component Value Date   WBC 6.0 03/20/2019   HGB 14.3 03/20/2019   HCT 43.1 03/20/2019   MCV 88 03/20/2019   PLT 198 03/20/2019   Lab Results  Component Value Date   NA 143 03/20/2019   K 4.4 03/20/2019   CO2 27 03/20/2019   GLUCOSE 108 (H) 03/20/2019   BUN 18 03/20/2019   CREATININE 1.17 03/20/2019   BILITOT 0.5 03/20/2019   ALKPHOS 62 03/20/2019   AST 16 03/20/2019   ALT 9 03/20/2019   PROT 6.4 03/20/2019   ALBUMIN 4.1 03/20/2019   CALCIUM 9.6 03/20/2019   ANIONGAP 6 08/20/2018   Lab Results  Component Value Date   CHOL 216 (H) 03/20/2019   Lab Results  Component Value Date   HDL 35 (L) 03/20/2019   Lab Results  Component Value Date   LDLCALC 148 (H) 03/20/2019   Lab Results  Component Value Date   TRIG 181 (H) 03/20/2019   Lab Results  Component  Value Date   CHOLHDL 6.2 (H) 03/20/2019   Lab Results  Component Value Date   HGBA1C 6.2 03/20/2019

## 2019-11-06 NOTE — Patient Instructions (Addendum)
Breztri 2 puffs twice daily  Bring financials when you come to see Almyra Free.

## 2019-11-09 ENCOUNTER — Encounter: Payer: Self-pay | Admitting: Family Medicine

## 2019-11-10 NOTE — Addendum Note (Signed)
Addended by: Loman Brooklyn on: 11/10/2019 01:39 PM   Modules accepted: Orders

## 2019-11-10 NOTE — Progress Notes (Signed)
Pt called = aware of orders

## 2019-11-23 ENCOUNTER — Ambulatory Visit (INDEPENDENT_AMBULATORY_CARE_PROVIDER_SITE_OTHER): Payer: Medicare Other | Admitting: Pharmacist

## 2019-11-23 ENCOUNTER — Other Ambulatory Visit: Payer: Self-pay

## 2019-11-23 DIAGNOSIS — J449 Chronic obstructive pulmonary disease, unspecified: Secondary | ICD-10-CM | POA: Diagnosis not present

## 2019-11-23 MED ORDER — ALBUTEROL SULFATE HFA 108 (90 BASE) MCG/ACT IN AERS: 2.0000 | INHALATION_SPRAY | Freq: Four times a day (QID) | RESPIRATORY_TRACT | 11 refills | Status: DC | PRN

## 2019-11-23 MED ORDER — BREZTRI AEROSPHERE 160-9-4.8 MCG/ACT IN AERO: 2.0000 | INHALATION_SPRAY | Freq: Two times a day (BID) | RESPIRATORY_TRACT | 4 refills | Status: DC

## 2019-11-23 NOTE — Progress Notes (Signed)
HPI Patient presents today to see pharmacy team for COPD.  Past medical history includes afib, CAD, GERD, HLD, BPH, HLD  Number of hospitalizations in past year:0 Number of COPD exacerbations in past year: n/a  COPD gold II with 41% reversibility - Followed in Pulmonary clinic/ [facility]/ [place] s/p smoking cessation in 1991 - PFT's FEV1 1.10 (44%) ratio 54 and 41% reversibilty  DLCO 69 corrects to 99%  - 03/16/2014 p extensive coaching HFA effectiveness =    90% from a baseline of 50%  Respiratory Medications Current: Cleatrice Burke in past: symbicort Patient reports no known adherence challenges  OBJECTIVE Allergies  Allergen Reactions  . Asa [Aspirin]     GI upset  . Niacin     REACTION: hot flashes    Outpatient Encounter Medications as of 11/23/2019  Medication Sig Note  . albuterol (PROVENTIL) (2.5 MG/3ML) 0.083% nebulizer solution Take 3 mLs (2.5 mg total) by nebulization every 6 (six) hours as needed for wheezing or shortness of breath.   . budesonide-formoterol (SYMBICORT) 160-4.5 MCG/ACT inhaler Inhale 2 puffs into the lungs daily.   . diazepam (VALIUM) 2 MG tablet Take 1 tablet (2 mg total) by mouth every 8 (eight) hours as needed (dizziness).   . ezetimibe (ZETIA) 10 MG tablet TAKE 1 TABLET EVERY DAY (DISCONTINUE PRAVASTATIN) 09/18/2019: Thinks this is causing dizziness.   . meclizine (ANTIVERT) 25 MG tablet Take 1 tablet (25 mg total) by mouth 3 (three) times daily as needed for dizziness.   . methocarbamol (ROBAXIN) 500 MG tablet Take 1 tablet (500 mg total) by mouth every 8 (eight) hours as needed for muscle spasms.   . metoprolol tartrate (LOPRESSOR) 25 MG tablet TAKE 1 AND 1/2 TABLETS TWICE DAILY ( NEW DOSE )   . omeprazole (PRILOSEC) 20 MG capsule Take 1 capsule (20 mg total) by mouth daily.   . traZODone (DESYREL) 50 MG tablet Take 50 mg by mouth at bedtime.   . Vitamin D, Ergocalciferol, (DRISDOL) 1.25 MG (50000 UNIT) CAPS capsule Take 1 capsule (50,000  Units total) by mouth every 7 (seven) days.   Marland Kitchen warfarin (COUMADIN) 5 MG tablet TAKE 1 TABLET DAILY EXCEPT TAKE 1/2 TABLET ON TUESDAYS AND THURSDAYS OR AS DIRECTD    No facility-administered encounter medications on file as of 11/23/2019.     Immunization History  Administered Date(s) Administered  . Influenza Whole 03/18/2012  . Influenza, High Dose Seasonal PF 03/17/2019  . Influenza,inj,Quad PF,6+ Mos 04/02/2017, 03/17/2018  . Influenza-Unspecified 03/17/2019  . Moderna SARS-COVID-2 Vaccination 09/30/2019, 10/27/2019  . Pneumococcal Conjugate-13 12/17/2013  . Pneumococcal Polysaccharide-23 03/18/2005     PFTs No flowsheet data found.   Eosinophils 0 on 08/21/2018  IgE n/a   Assessment   1. Inhaler Optimization  Patient was counseled on the purpose, proper use, and adverse effects of Breztri inhaler.  Instructed patient to rinse mouth with water after using in order to prevent fungal infection.  Patient verbalized understanding.  Reviewed appropriate use of maintenance vs rescue inhalers.  Stressed importance of using maintenance inhaler daily and rescue inhaler only as needed.  Patient verbalized understanding.  Patient able to explain proper inhaler technique using teach back method. Patient was given sample in office today.    2. Medication Reconciliation  A drug regimen assessment was performed, including review of allergies, interactions, disease-state management, dosing and immunization history. Medications were reviewed with the patient, including name, instructions, indication, goals of therapy, potential side effects, importance of adherence, and safe use.  Drug interaction(s): N/A  3. Immunizations  Patient is indicated for the influenzae, pneumonia, and shingles vaccinations.  PLAN  -Continue Breztri  Patient states he has had decreased cough since starting Breztri  He states he "Feels the same"--encouraged patient to continue on Breztri every day to avoid  exacerbations  Paperwork filled out for [employer] (patient assistance for [employer] patient's financials  14 day sample given, -->LOT#[id number], EXP8/22; RQS#[id number] C00, EXP8/22  -Continue albuterol rescue inhaler  Called refill in to [facility]   Use sparingly   -Continue albuterol nebs ONLY as needed  Patient reports only using 23-monthly   All questions encouraged and answered.  Instructed patient to reach out with any further questions or concerns.  Thank you for allowing pharmacy to participate in this patient's care.  This appointment required 30 minutes of patient care (this includes precharting, chart review, review of results, face-to-face care, etc.).  Regina Eck, PharmD, BCPS Clinical Pharmacist, [place]  II Phone [phone]

## 2019-11-25 ENCOUNTER — Telehealth: Payer: Self-pay | Admitting: Family Medicine

## 2019-11-26 NOTE — Telephone Encounter (Signed)
Call returned to patient, however no VM was set up Paperwork received from patient.  Will file patient assistance applications for [employer] inhaler

## 2019-11-27 NOTE — Telephone Encounter (Signed)
BREZTRI PAP FAXED TO [employer]  WILL F/U IN 2 WEEKS LEFT VM FOR PATIENT

## 2019-11-30 ENCOUNTER — Ambulatory Visit (INDEPENDENT_AMBULATORY_CARE_PROVIDER_SITE_OTHER): Payer: Medicare Other | Admitting: *Deleted

## 2019-11-30 DIAGNOSIS — I4891 Unspecified atrial fibrillation: Secondary | ICD-10-CM

## 2019-11-30 DIAGNOSIS — I482 Chronic atrial fibrillation, unspecified: Secondary | ICD-10-CM

## 2019-11-30 DIAGNOSIS — Z5181 Encounter for therapeutic drug level monitoring: Secondary | ICD-10-CM | POA: Diagnosis not present

## 2019-11-30 LAB — POCT INR: INR: 3.3 — AB (ref 2.0–3.0)

## 2019-11-30 NOTE — Patient Instructions (Signed)
HOLD WARFARIN TONIGHT THEN DECREASE DOSE TO 1 tablet daily except 1/2 tablet on Tuesdays and Thursdays Recheck in 6 weeks

## 2019-12-01 ENCOUNTER — Ambulatory Visit (HOSPITAL_COMMUNITY)
Admission: RE | Admit: 2019-12-01 | Discharge: 2019-12-01 | Disposition: A | Discharge status: Home / Self Care | Payer: Medicare Other | Source: Ambulatory Visit | Attending: Family Medicine | Admitting: Family Medicine

## 2019-12-01 ENCOUNTER — Other Ambulatory Visit: Payer: Self-pay

## 2019-12-01 DIAGNOSIS — R5383 Other fatigue: Secondary | ICD-10-CM | POA: Insufficient documentation

## 2019-12-01 DIAGNOSIS — N401 Enlarged prostate with lower urinary tract symptoms: Secondary | ICD-10-CM | POA: Diagnosis present

## 2019-12-01 LAB — POCT I-STAT CREATININE: Creatinine, Ser: 1.2 mg/dL (ref 0.61–1.24)

## 2019-12-01 MED ORDER — IOHEXOL 300 MG/ML  SOLN
100.0000 mL | Freq: Once | INTRAMUSCULAR | Status: AC | PRN
  Administered 2019-12-01: 100 mL via INTRAVENOUS

## 2019-12-02 ENCOUNTER — Other Ambulatory Visit: Payer: Self-pay | Admitting: Family Medicine

## 2019-12-02 DIAGNOSIS — R911 Solitary pulmonary nodule: Secondary | ICD-10-CM

## 2019-12-02 DIAGNOSIS — R5383 Other fatigue: Secondary | ICD-10-CM

## 2019-12-02 MED ORDER — BREZTRI AEROSPHERE 160-9-4.8 MCG/ACT IN AERO: 2.0000 | INHALATION_SPRAY | Freq: Two times a day (BID) | RESPIRATORY_TRACT | 12 refills | Status: DC

## 2019-12-02 NOTE — Addendum Note (Signed)
Addended by: Lottie Dawson D on: 12/02/2019 05:03 PM   Modules accepted: Orders

## 2019-12-03 ENCOUNTER — Telehealth: Payer: Self-pay | Admitting: Family Medicine

## 2019-12-23 ENCOUNTER — Other Ambulatory Visit: Payer: Self-pay

## 2019-12-23 ENCOUNTER — Other Ambulatory Visit: Payer: Self-pay | Admitting: Cardiology

## 2019-12-23 ENCOUNTER — Encounter (HOSPITAL_COMMUNITY): Payer: Self-pay

## 2019-12-23 ENCOUNTER — Ambulatory Visit (HOSPITAL_COMMUNITY)
Admission: RE | Admit: 2019-12-23 | Discharge: 2019-12-23 | Disposition: A | Discharge status: Home / Self Care | Payer: Medicare Other | Source: Ambulatory Visit | Attending: Family Medicine | Admitting: Family Medicine

## 2019-12-23 DIAGNOSIS — R911 Solitary pulmonary nodule: Secondary | ICD-10-CM

## 2019-12-23 DIAGNOSIS — R5383 Other fatigue: Secondary | ICD-10-CM | POA: Insufficient documentation

## 2019-12-23 MED ORDER — IOHEXOL 300 MG/ML  SOLN
75.0000 mL | Freq: Once | INTRAMUSCULAR | Status: AC | PRN
  Administered 2019-12-23: 75 mL via INTRAVENOUS

## 2020-01-01 ENCOUNTER — Telehealth: Payer: Self-pay | Admitting: Family Medicine

## 2020-01-01 DIAGNOSIS — R2689 Other abnormalities of gait and mobility: Secondary | ICD-10-CM

## 2020-01-01 NOTE — Telephone Encounter (Signed)
Patient came into the office today with his wife for her appointment and requested a vitamin B12 level be added to the lab work that he still needs to come have drawn.

## 2020-01-11 ENCOUNTER — Other Ambulatory Visit: Payer: Self-pay

## 2020-01-11 ENCOUNTER — Ambulatory Visit (INDEPENDENT_AMBULATORY_CARE_PROVIDER_SITE_OTHER): Payer: Medicare Other | Admitting: *Deleted

## 2020-01-11 DIAGNOSIS — I482 Chronic atrial fibrillation, unspecified: Secondary | ICD-10-CM | POA: Diagnosis not present

## 2020-01-11 DIAGNOSIS — I4891 Unspecified atrial fibrillation: Secondary | ICD-10-CM

## 2020-01-11 DIAGNOSIS — Z5181 Encounter for therapeutic drug level monitoring: Secondary | ICD-10-CM | POA: Diagnosis not present

## 2020-01-11 LAB — POCT INR: INR: 2.2 (ref 2.0–3.0)

## 2020-01-11 NOTE — Patient Instructions (Signed)
Continue warfarin 1 tablet daily except 1/2 tablet on Tuesdays and Thursdays Recheck in 6 weeks

## 2020-02-01 ENCOUNTER — Other Ambulatory Visit: Payer: Self-pay | Admitting: *Deleted

## 2020-02-01 MED ORDER — EZETIMIBE 10 MG PO TABS: 10.0000 mg | ORAL_TABLET | Freq: Every day | ORAL | 3 refills | Status: DC

## 2020-02-12 ENCOUNTER — Ambulatory Visit (INDEPENDENT_AMBULATORY_CARE_PROVIDER_SITE_OTHER): Payer: Medicare Other | Admitting: Family Medicine

## 2020-02-12 ENCOUNTER — Encounter: Payer: Self-pay | Admitting: Family Medicine

## 2020-02-12 ENCOUNTER — Other Ambulatory Visit: Payer: Self-pay

## 2020-02-12 VITALS — BP 128/82 | HR 63 | Temp 97.7°F | Ht 66.0 in | Wt 175.2 lb

## 2020-02-12 DIAGNOSIS — R918 Other nonspecific abnormal finding of lung field: Secondary | ICD-10-CM

## 2020-02-12 DIAGNOSIS — R5383 Other fatigue: Secondary | ICD-10-CM | POA: Diagnosis not present

## 2020-02-12 DIAGNOSIS — R9389 Abnormal findings on diagnostic imaging of other specified body structures: Secondary | ICD-10-CM

## 2020-02-12 DIAGNOSIS — J449 Chronic obstructive pulmonary disease, unspecified: Secondary | ICD-10-CM

## 2020-02-12 DIAGNOSIS — R140 Abdominal distension (gaseous): Secondary | ICD-10-CM

## 2020-02-12 DIAGNOSIS — M5442 Lumbago with sciatica, left side: Secondary | ICD-10-CM

## 2020-02-12 DIAGNOSIS — G8929 Other chronic pain: Secondary | ICD-10-CM

## 2020-02-12 DIAGNOSIS — N429 Disorder of prostate, unspecified: Secondary | ICD-10-CM

## 2020-02-12 DIAGNOSIS — R42 Dizziness and giddiness: Secondary | ICD-10-CM | POA: Diagnosis not present

## 2020-02-12 DIAGNOSIS — E782 Mixed hyperlipidemia: Secondary | ICD-10-CM

## 2020-02-12 MED ORDER — DIAZEPAM 2 MG PO TABS: 2.0000 mg | ORAL_TABLET | Freq: Three times a day (TID) | ORAL | 0 refills | Status: DC | PRN

## 2020-02-12 MED ORDER — TRAZODONE HCL 50 MG PO TABS: 50.0000 mg | ORAL_TABLET | Freq: Every day | ORAL | 2 refills | Status: DC

## 2020-02-12 MED ORDER — TRAZODONE HCL 50 MG PO TABS: 50.0000 mg | ORAL_TABLET | Freq: Every day | ORAL | 5 refills | Status: DC

## 2020-02-12 MED ORDER — METHOCARBAMOL 500 MG PO TABS: 500.0000 mg | ORAL_TABLET | Freq: Three times a day (TID) | ORAL | 0 refills | Status: DC | PRN

## 2020-02-12 MED ORDER — MECLIZINE HCL 25 MG PO TABS: 25.0000 mg | ORAL_TABLET | Freq: Three times a day (TID) | ORAL | 2 refills | Status: DC | PRN

## 2020-02-12 NOTE — Progress Notes (Signed)
Assessment & Plan:  1. 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  - CBC with Differential/Platelet  2. Fatigue, unspecified type - PSA, total and free - Testosterone,Free and Total - CBC with Differential/Platelet - CMP14+EGFR - Folate - Vitamin B12 - CT Chest Wo Contrast; Future  3. Disorder of prostate, unspecified  - PSA, total and free  4. Vertigo - Patient does well as long as he has medication to take as needed - refills sent today.  - diazepam (VALIUM) 2 MG tablet; Take 1 tablet (2 mg total) by mouth every 8 (eight) hours as needed (dizziness).  Dispense: 30 tablet; Refill: 0 - meclizine (ANTIVERT) 25 MG tablet; Take 1 tablet (25 mg total) by mouth 3 (three) times daily as needed for dizziness.  Dispense: 30 tablet; Refill: 2  5. Mixed hyperlipidemia - CMP14+EGFR - Lipid panel  6. Bloating - Encouraged patient to try something for gas when he feels this way such as Mylanta or GasX.  7. Abnormal CT of the chest - CT Chest Wo Contrast; Future  8. Ground glass opacity present on imaging of lung - CT Chest Wo Contrast; Future  9. Chronic midline low back pain with left-sided sciatica - Patient is going to go see the chiropractor. If this does not help he is going to let me know if he wants a referral to an orthopedic or PT - this will be placed to [facility] in [place] where he lives to give him something close to home and a physical therapist that does dry needling. Discussed good stretching at home.  - methocarbamol (ROBAXIN) 500 MG tablet; Take 1 tablet (500 mg total) by mouth every 8 (eight) hours as needed for muscle spasms.  Dispense: 30 tablet; Refill: 0 - CBC with Differential/Platelet - CMP14+EGFR   Return in about 3 months (around 05/14/2020) for follow-up of chronic medication conditions.  [name], MSN, APRN, FNP-C [facility]  Subjective:    Patient ID: LOYDE ORTH, male     DOB: 1941/04/19, 79 y.o.   MRN: [id number]  Patient Care Team: Loman Brooklyn, FNP as PCP - General (Family Medicine) Harl Bowie Alphonse Guild, MD as PCP - Cardiology (Cardiology) Harl Bowie Alphonse Guild, MD as Consulting Physician (Cardiology) Irine Seal, MD as Attending Physician (Urology) Lavera Guise, [facility] (Pharmacist)   Chief Complaint:  Chief Complaint  Patient presents with  . COPD    check up of chronic medical conditions  . Hyperlipidemia  . Hip Pain    Patient states he has been having bilateral hip, leg and lower back pain x 1 month.  . Bloated    x 2 months  . Dizziness    Patient states he has been having on going dizziness x 3-4 years.    HPI: [name] is a 79 y.o. male presenting on 02/12/2020 for COPD (check up of chronic medical conditions), Hyperlipidemia, Hip Pain (Patient states he has been having bilateral hip, leg and lower back pain x 1 month.), Bloated (x 2 months), and Dizziness (Patient states he has been having on going dizziness x 3-4 years.)  COPD: patient was started on Breztri at his last appointment due to uncontrolled COPD. He is using it as prescribed and likes it. His albuterol usage has decreased since switching from Symbicort to [place].   Fatigue: patient had labs ordered at his last visit to work-up fatigue, but did not complete them until this morning. He has completed  the ABD/pelvic CT scan, which resulted in a chest CT. He is now in need of a three month follow-up chest CT due to left upper lobe and left lower lobe ground-glass opacities (due March 24, 2020).   Vertigo: patient has had some episodes of vertigo recently. He is out of both meclizine and Valium and is in need of refills.   New complaints: Patient reports he has been feeling bloated lately, which causes him to feel short of breath sometimes. Denies any pain or tenderness. He reports he has regular BMs that are soft with no straining or pain.   Patient is also concerned about  soreness in his hips and low back for the past month. He does have left sided sciatica. The pain does sometimes keep him from sleeping. Getting up is very hard for him and he has to take small steps with a wide stance to get going.    Social history:  Relevant past medical, surgical, family and social history reviewed and updated as indicated. Interim medical history since our last visit reviewed.  Allergies and medications reviewed and updated.  DATA REVIEWED: CHART IN EPIC  ROS: Negative unless specifically indicated above in HPI.    Current Outpatient Medications:  .  albuterol (PROVENTIL) (2.5 MG/3ML) 0.083% nebulizer solution, Take 3 mLs (2.5 mg total) by nebulization every 6 (six) hours as needed for wheezing or shortness of breath., Disp: 72 mL, Rfl: 0 .  albuterol (VENTOLIN HFA) 108 (90 Base) MCG/ACT inhaler, Inhale 2 puffs into the lungs every 6 (six) hours as needed for wheezing or shortness of breath., Disp: 18 g, Rfl: 11 .  Budeson-Glycopyrrol-Formoterol (BREZTRI AEROSPHERE) 160-9-4.8 MCG/ACT AERO, Inhale 2 puffs into the lungs in the morning and at bedtime., Disp: 10.7 g, Rfl: 12 .  diazepam (VALIUM) 2 MG tablet, Take 1 tablet (2 mg total) by mouth every 8 (eight) hours as needed (dizziness)., Disp: 30 tablet, Rfl: 0 .  ezetimibe (ZETIA) 10 MG tablet, Take 1 tablet (10 mg total) by mouth daily., Disp: 90 tablet, Rfl: 3 .  meclizine (ANTIVERT) 25 MG tablet, Take 1 tablet (25 mg total) by mouth 3 (three) times daily as needed for dizziness., Disp: 30 tablet, Rfl: 2 .  methocarbamol (ROBAXIN) 500 MG tablet, Take 1 tablet (500 mg total) by mouth every 8 (eight) hours as needed for muscle spasms., Disp: 30 tablet, Rfl: 0 .  metoprolol tartrate (LOPRESSOR) 25 MG tablet, TAKE 1 AND 1/2 TABLETS TWICE DAILY ( NEW DOSE ), Disp: 270 tablet, Rfl: 1 .  omeprazole (PRILOSEC) 20 MG capsule, Take 1 capsule (20 mg total) by mouth daily., Disp: 90 capsule, Rfl: 1 .  traZODone (DESYREL) 50 MG  tablet, Take 50 mg by mouth at bedtime., Disp: , Rfl:  .  Vitamin D, Ergocalciferol, (DRISDOL) 1.25 MG (50000 UNIT) CAPS capsule, Take 1 capsule (50,000 Units total) by mouth every 7 (seven) days., Disp: 12 capsule, Rfl: 2 .  warfarin (COUMADIN) 5 MG tablet, TAKE 1 TABLET DAILY EXCEPT TAKE 1/2 TABLET ON TUESDAYS AND THURSDAYS OR AS DIRECTD, Disp: 90 tablet, Rfl: 3   Allergies  Allergen Reactions  . Asa [Aspirin]     GI upset  . Niacin     REACTION: hot flashes   Past Medical History:  Diagnosis Date  . Anxiety disorder   . Benign prostatic hypertrophy   . Bilateral cataracts   . CAD (coronary artery disease)    Catheterization 2009, mild CAD  . Carotid artery disease ([place])  Doppler, September, 2011, less than 50% bilateral  . COPD (chronic obstructive pulmonary disease) ([place])   . CVA (cerebral infarction)    Hospitalization.  MRI...    Left anterior cerebral artery territory, September, 2011... Coumadin started  . DYSLIPIDEMIA 11/01/2009   Qualifier: Diagnosis of  By: Ron Parker, MD, Leonidas Romberg Dorinda Hill   . Ejection fraction    EF 60%  . Ejection fraction    EF 55-60%, echo, September, 2011  . Gastroesophageal reflux disease   . Hyperlipidemia   . Persistent atrial fibrillation ([place])       . Ptosis of eyelid, left    Surgery in the past  . Sweating abnormality    Episodes of sweating appeared to be related to a delayed reaction to niacin  . Vitamin D insufficiency     Past Surgical History:  Procedure Laterality Date  . APPENDECTOMY    . CATARACT EXTRACTION, BILATERAL    . KNEE SURGERY Right 2019   torn ligament?   . TRANSURETHRAL RESECTION OF PROSTATE N/A 08/19/2018   Procedure: TRANSURETHRAL RESECTION OF THE PROSTATE (TURP) OF PROSTATE ABCESS;  Surgeon: Alexis Frock, MD;  Location: [facility];  Service: Urology;  Laterality: N/A;    Social History   Socioeconomic History  . Marital status: Married    Spouse name: shirley  . Number of children: 5  . Years of education:  76  . Highest education level: 11th grade  Occupational History  . Occupation: Retired Statistician   Tobacco Use  . Smoking status: Former Smoker    Packs/day: 3.00    Years: 50.00    Pack years: 150.00    Types: Cigarettes    Quit date: 03/18/1990    Years since quitting: 29.9  . Smokeless tobacco: Former Systems developer    Types: [place] date: 06/18/2000  Vaping Use  . Vaping Use: Never used  Substance and Sexual Activity  . Alcohol use: No    Alcohol/week: 0.0 standard drinks  . Drug use: No  . Sexual activity: Not Currently  Other Topics Concern  . Not on file  Social History Narrative   Quit smoking in 1993. No significant alcohol abuse. Retired from trucking.    Social Determinants of Health   Financial Resource Strain: Low Risk   . Difficulty of Paying Living Expenses: Not hard at all  Food Insecurity: No Food Insecurity  . Worried About Charity fundraiser in the Last Year: Never true  . Ran Out of Food in the Last Year: Never true  Transportation Needs: No Transportation Needs  . Lack of Transportation (Medical): No  . Lack of Transportation (Non-Medical): No  Physical Activity: Insufficiently Active  . Days of Exercise per Week: 7 days  . Minutes of Exercise per Session: 20 min  Stress: No Stress Concern Present  . Feeling of Stress : Not at all  Social Connections: Socially Integrated  . Frequency of Communication with Friends and Family: More than three times a week  . Frequency of Social Gatherings with Friends and Family: More than three times a week  . Attends Religious Services: More than 4 times per year  . Active Member of Clubs or Organizations: Yes  . Attends Archivist Meetings: More than 4 times per year  . Marital Status: Married  Human resources officer Violence: Not At Risk  . Fear of Current or Ex-Partner: No  . Emotionally Abused: No  . Physically Abused: No  . Sexually Abused: No  Objective:    BP 128/82 Comment: manual  Pulse  63   Temp 97.7 F (36.5 C) (Temporal)   Ht _0  (1.676 m)   Wt 175 lb 3.2 oz (79.5 kg)   SpO2 97%   BMI 28.28 kg/m   Wt Readings from Last 3 Encounters:  02/12/20 175 lb 3.2 oz (79.5 kg)  11/06/19 172 lb 6.4 oz (78.2 kg)  09/25/19 170 lb (77.1 kg)    Physical Exam Vitals reviewed.  Constitutional:      General: He is not in acute distress.    Appearance: Normal appearance. He is overweight. He is not ill-appearing, toxic-appearing or diaphoretic.  HENT:     Head: Normocephalic and atraumatic.  Eyes:     General: No scleral icterus.       Right eye: No discharge.        Left eye: No discharge.     Conjunctiva/sclera: Conjunctivae normal.  Cardiovascular:     Rate and Rhythm: Normal rate and regular rhythm.     Heart sounds: Normal heart sounds. No murmur heard.  No friction rub. No gallop.   Pulmonary:     Effort: Pulmonary effort is normal. No respiratory distress.     Breath sounds: Normal breath sounds. No stridor. No wheezing, rhonchi or rales.  Musculoskeletal:        General: Normal range of motion.     Cervical back: Normal range of motion.     Comments: Pain in muscles of inner thigh.   Skin:    General: Skin is warm and dry.  Neurological:     Mental Status: He is alert and oriented to person, place, and time. Mental status is at baseline.     Gait: Gait abnormal (shuffles feet when he first gets up).  Psychiatric:        Mood and Affect: Mood normal.        Behavior: Behavior normal.        Thought Content: Thought content normal.        Judgment: Judgment normal.     Lab Results  Component Value Date   TSH 4.360 04/07/2019   Lab Results  Component Value Date   WBC 6.0 03/20/2019   HGB 14.3 03/20/2019   HCT 43.1 03/20/2019   MCV 88 03/20/2019   PLT 198 03/20/2019   Lab Results  Component Value Date   NA 143 03/20/2019   K 4.4 03/20/2019   CO2 27 03/20/2019   GLUCOSE 108 (H) 03/20/2019   BUN 18 03/20/2019   CREATININE 1.20 12/01/2019    BILITOT 0.5 03/20/2019   ALKPHOS 62 03/20/2019   AST 16 03/20/2019   ALT 9 03/20/2019   PROT 6.4 03/20/2019   ALBUMIN 4.1 03/20/2019   CALCIUM 9.6 03/20/2019   ANIONGAP 6 08/20/2018   Lab Results  Component Value Date   CHOL 216 (H) 03/20/2019   Lab Results  Component Value Date   HDL 35 (L) 03/20/2019   Lab Results  Component Value Date   LDLCALC 148 (H) 03/20/2019   Lab Results  Component Value Date   TRIG 181 (H) 03/20/2019   Lab Results  Component Value Date   CHOLHDL 6.2 (H) 03/20/2019   Lab Results  Component Value Date   HGBA1C 6.2 03/20/2019

## 2020-02-15 ENCOUNTER — Encounter: Payer: Self-pay | Admitting: Family Medicine

## 2020-02-15 LAB — CMP14+EGFR
ALT: 10 IU/L (ref 0–44)
AST: 15 IU/L (ref 0–40)
Albumin/Globulin Ratio: 1.9 (ref 1.2–2.2)
Albumin: 4.3 g/dL (ref 3.6–4.6)
Alkaline Phosphatase: 57 IU/L (ref 48–121)
BUN/Creatinine Ratio: 14 (ref 10–24)
BUN: 17 mg/dL (ref 8–27)
Bilirubin Total: 0.4 mg/dL (ref 0.0–1.2)
CO2: 28 mmol/L (ref 20–29)
Calcium: 9 mg/dL (ref 8.6–10.2)
Chloride: 104 mmol/L (ref 96–106)
Creatinine, Ser: 1.21 mg/dL (ref 0.76–1.27)
GFR calc Af Amer: 65 mL/min/{1.73_m2} (ref >59)
GFR calc non Af Amer: 56 mL/min/{1.73_m2} — ABNORMAL LOW (ref >59)
Globulin, Total: 2.3 g/dL (ref 1.5–4.5)
Glucose: 109 mg/dL — ABNORMAL HIGH (ref 65–99)
Potassium: 4.3 mmol/L (ref 3.5–5.2)
Sodium: 144 mmol/L (ref 134–144)
Total Protein: 6.6 g/dL (ref 6.0–8.5)

## 2020-02-15 LAB — CBC WITH DIFFERENTIAL/PLATELET
Basophils Absolute: 0 10*3/uL (ref 0.0–0.2)
Basos: 1 %
EOS (ABSOLUTE): 0.1 10*3/uL (ref 0.0–0.4)
Eos: 1 %
Hematocrit: 43.8 % (ref 37.5–51.0)
Hemoglobin: 14.2 g/dL (ref 13.0–17.7)
Immature Grans (Abs): 0 10*3/uL (ref 0.0–0.1)
Immature Granulocytes: 0 %
Lymphocytes Absolute: 1.9 10*3/uL (ref 0.7–3.1)
Lymphs: 28 %
MCH: 29.3 pg (ref 26.6–33.0)
MCHC: 32.4 g/dL (ref 31.5–35.7)
MCV: 90 fL (ref 79–97)
Monocytes Absolute: 0.6 10*3/uL (ref 0.1–0.9)
Monocytes: 9 %
Neutrophils Absolute: 4.2 10*3/uL (ref 1.4–7.0)
Neutrophils: 61 %
Platelets: 261 10*3/uL (ref 150–450)
RBC: 4.85 x10E6/uL (ref 4.14–5.80)
RDW: 12.4 % (ref 11.6–15.4)
WBC: 6.9 10*3/uL (ref 3.4–10.8)

## 2020-02-15 LAB — PSA, TOTAL AND FREE
PSA, Free Pct: 24 %
PSA, Free: 0.24 ng/mL
Prostate Specific Ag, Serum: 1 ng/mL (ref 0.0–4.0)

## 2020-02-15 LAB — LIPID PANEL
Chol/HDL Ratio: 6.2 ratio — ABNORMAL HIGH (ref 0.0–5.0)
Cholesterol, Total: 224 mg/dL — ABNORMAL HIGH (ref 100–199)
HDL: 36 mg/dL — ABNORMAL LOW (ref >39)
LDL Chol Calc (NIH): 168 mg/dL — ABNORMAL HIGH (ref 0–99)
Triglycerides: 112 mg/dL (ref 0–149)
VLDL Cholesterol Cal: 20 mg/dL (ref 5–40)

## 2020-02-15 LAB — TESTOSTERONE,FREE AND TOTAL
Testosterone, Free: 3.9 pg/mL — ABNORMAL LOW (ref 6.6–18.1)
Testosterone: 302 ng/dL (ref 264–916)

## 2020-02-15 LAB — VITAMIN B12: Vitamin B-12: 718 pg/mL (ref 232–1245)

## 2020-02-15 LAB — FOLATE: Folate: 14.8 ng/mL (ref >3.0)

## 2020-02-23 ENCOUNTER — Ambulatory Visit (INDEPENDENT_AMBULATORY_CARE_PROVIDER_SITE_OTHER): Payer: Medicare Other | Admitting: Pharmacist

## 2020-02-23 DIAGNOSIS — I482 Chronic atrial fibrillation, unspecified: Secondary | ICD-10-CM | POA: Diagnosis not present

## 2020-02-23 DIAGNOSIS — Z5181 Encounter for therapeutic drug level monitoring: Secondary | ICD-10-CM

## 2020-02-23 DIAGNOSIS — I4891 Unspecified atrial fibrillation: Secondary | ICD-10-CM

## 2020-02-23 LAB — POCT INR: INR: 2 (ref 2.0–3.0)

## 2020-02-23 NOTE — Patient Instructions (Signed)
Description   Continue warfarin 1 tablet daily except 1/2 tablet on Tuesdays and Thursdays Recheck in 6 weeks

## 2020-02-26 IMAGING — CR DG ORBITS FOR FOREIGN BODY
2 series · 2 of 2 positions shown · non-contrast
Comparison: None.

CLINICAL DATA: Metal working/exposure; clearance prior to MRI

EXAM:
ORBITS FOR FOREIGN BODY - 2 VIEW

[w orbit pa (1 of 2)]
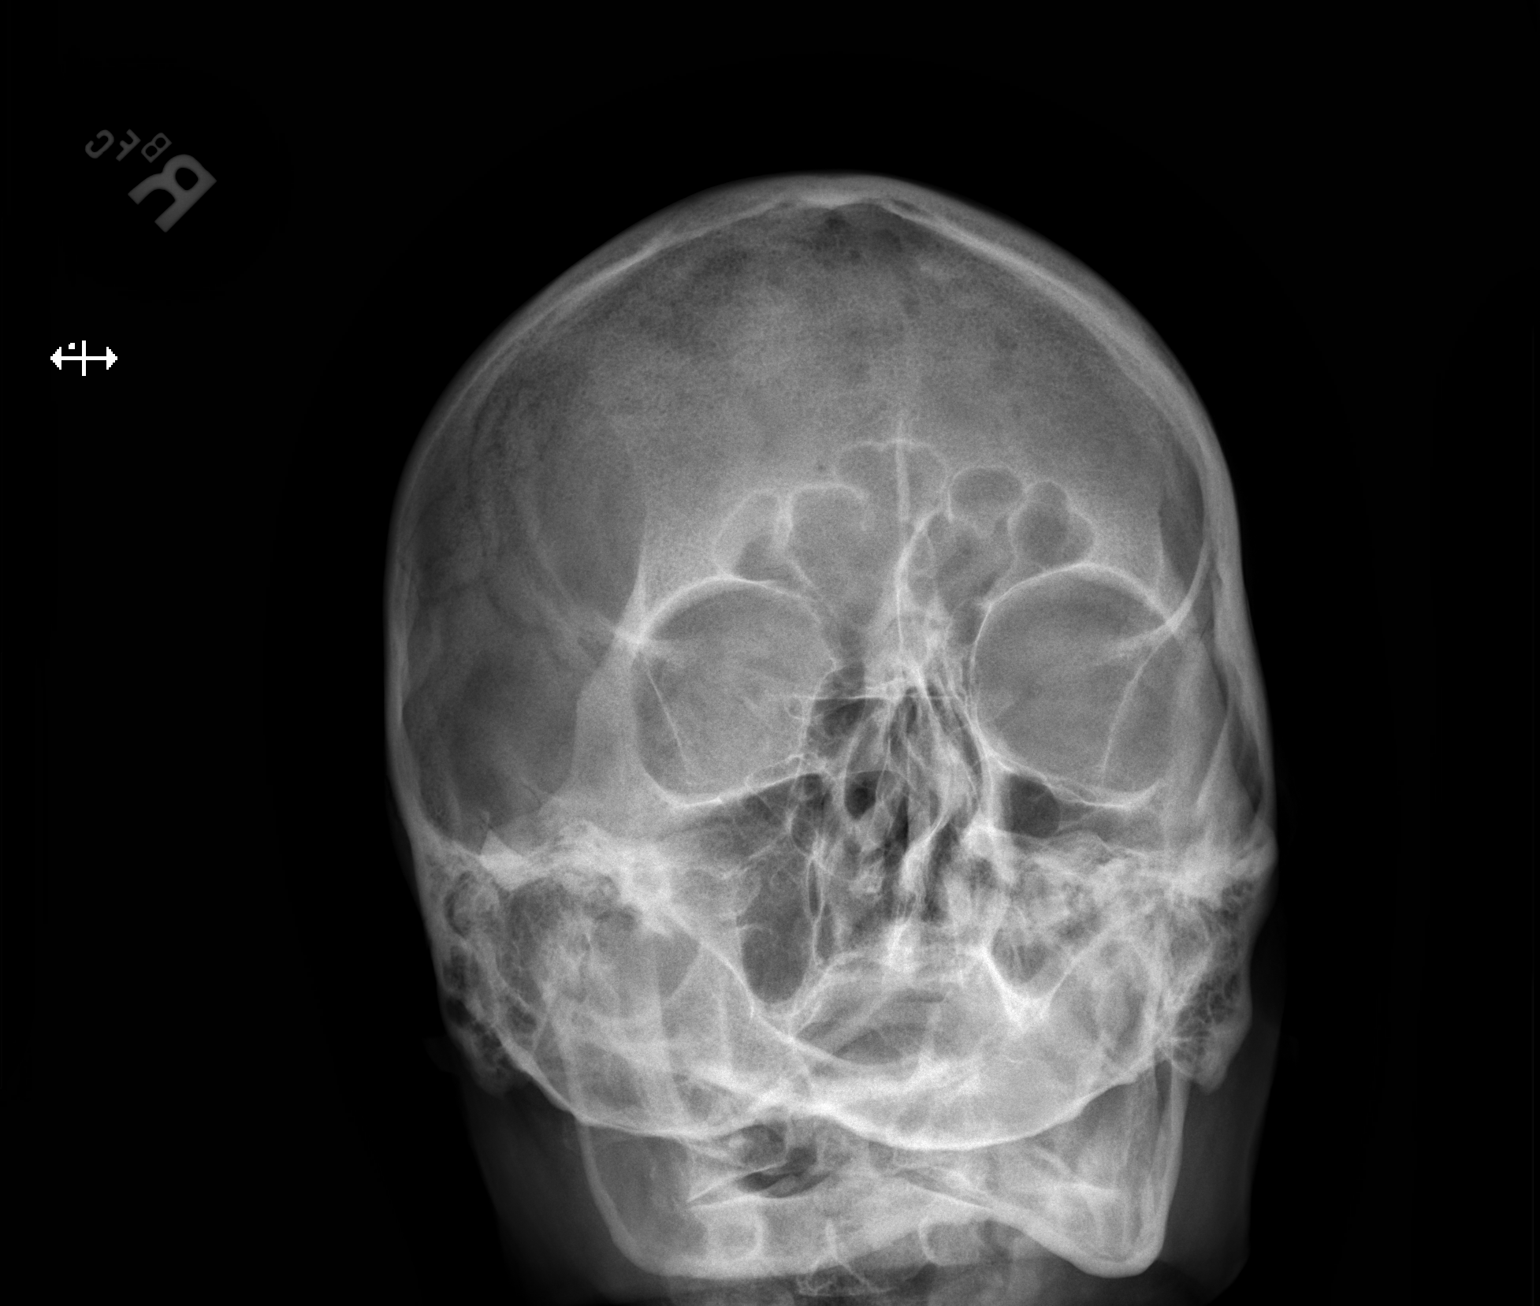

[w orbit pa (2 of 2)]
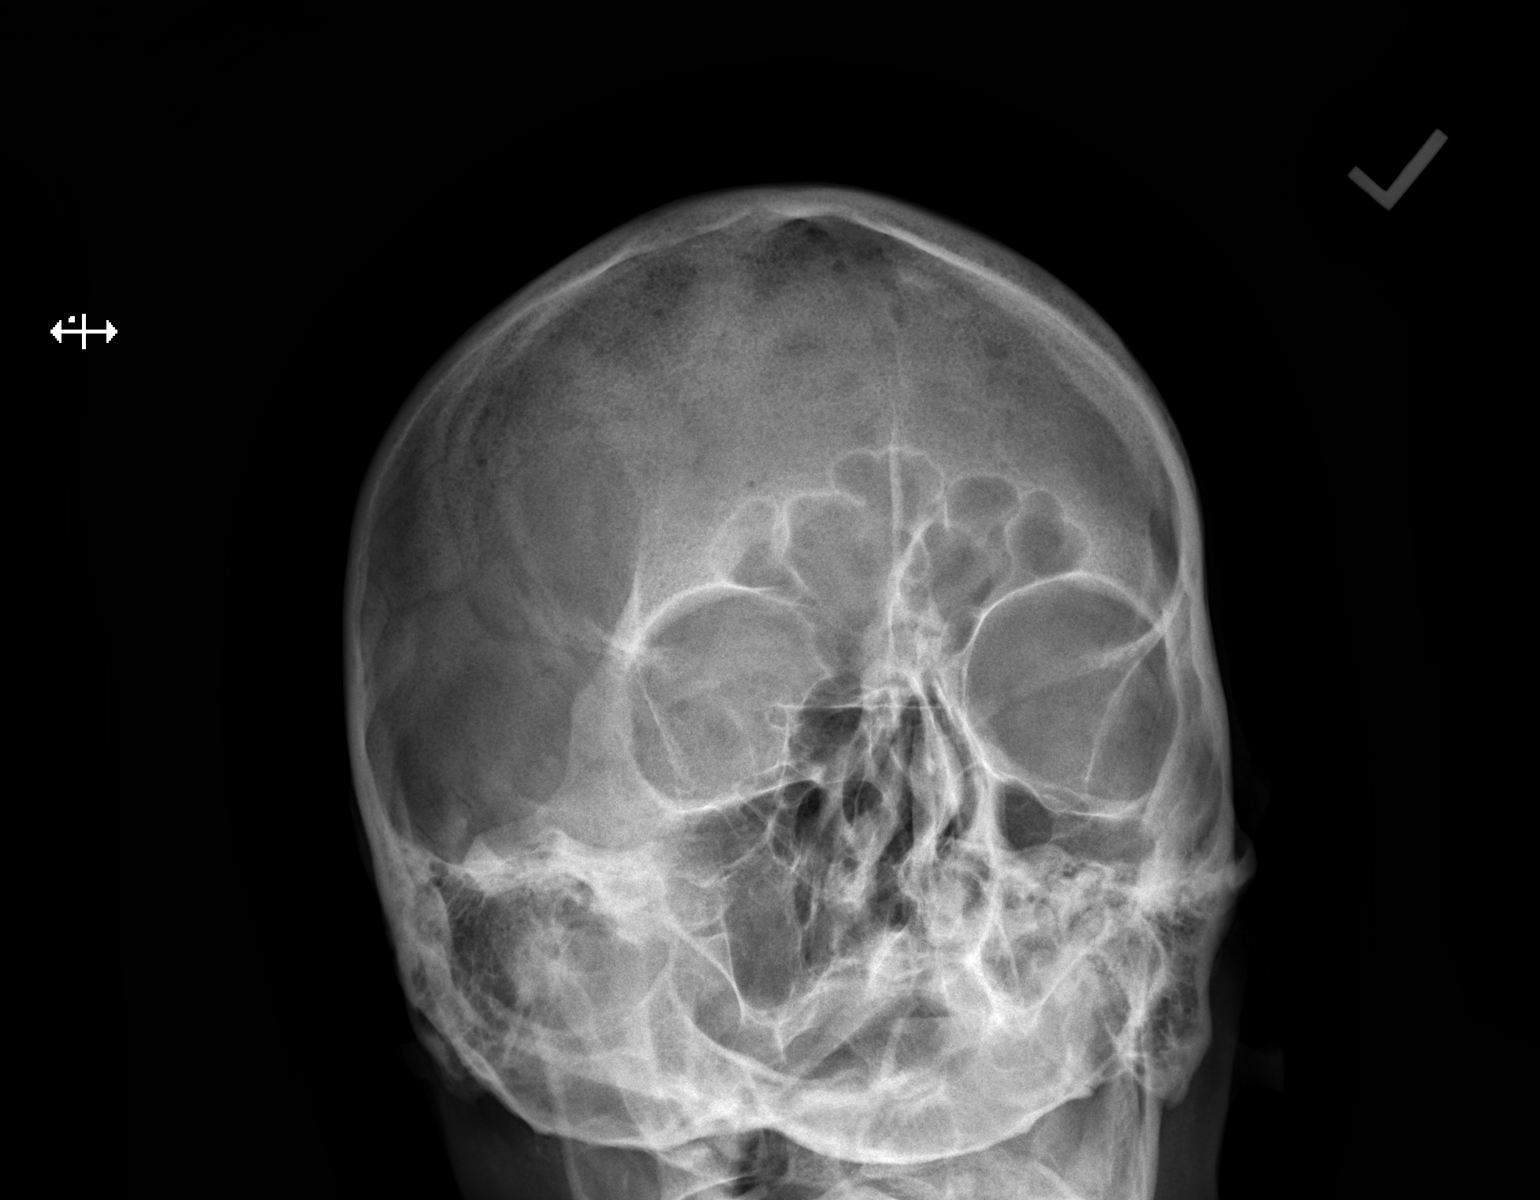

[2 of 2 positions shown; findings below may reference images not displayed]

FINDINGS: No radiopaque foreign body.  Ok to proceed to MRI.
IMPRESSION: No radiopaque foreign body.  Ok to proceed to MRI.

## 2020-02-29 ENCOUNTER — Telehealth: Payer: Self-pay | Admitting: Family Medicine

## 2020-03-01 NOTE — Telephone Encounter (Signed)
[name] in referral Handling

## 2020-03-01 NOTE — Telephone Encounter (Signed)
Please make patient aware of [name]'s response. °

## 2020-03-18 ENCOUNTER — Ambulatory Visit (INDEPENDENT_AMBULATORY_CARE_PROVIDER_SITE_OTHER): Payer: Medicare Other | Admitting: *Deleted

## 2020-03-18 ENCOUNTER — Ambulatory Visit (INDEPENDENT_AMBULATORY_CARE_PROVIDER_SITE_OTHER): Payer: Medicare Other | Admitting: Cardiology

## 2020-03-18 ENCOUNTER — Encounter: Payer: Self-pay | Admitting: Cardiology

## 2020-03-18 VITALS — BP 124/72 | HR 61 | Ht 66.0 in | Wt 178.0 lb

## 2020-03-18 DIAGNOSIS — Z5181 Encounter for therapeutic drug level monitoring: Secondary | ICD-10-CM

## 2020-03-18 DIAGNOSIS — I482 Chronic atrial fibrillation, unspecified: Secondary | ICD-10-CM

## 2020-03-18 DIAGNOSIS — E782 Mixed hyperlipidemia: Secondary | ICD-10-CM | POA: Diagnosis not present

## 2020-03-18 DIAGNOSIS — I4891 Unspecified atrial fibrillation: Secondary | ICD-10-CM

## 2020-03-18 DIAGNOSIS — I2510 Atherosclerotic heart disease of native coronary artery without angina pectoris: Secondary | ICD-10-CM

## 2020-03-18 LAB — POCT INR: INR: 2.5 (ref 2.0–3.0)

## 2020-03-18 NOTE — Patient Instructions (Signed)
Continue warfarin 1 tablet daily except 1/2 tablet on Tuesdays and Fridays Recheck in 6 weeks 

## 2020-03-18 NOTE — Progress Notes (Signed)
Clinical Summary [name] is a 79 y.o.male seen today for follow up of the following medical problems.   1. Chronic afib - DOACs were too expensive in the past, he has elected to remain on coumadin. - recent admission with a febrile illness, rigors at [name]. Caused his afib rates to elevate in the hospital, lopressor was increased to 100mg  bid. Took a few days after discharge, but starting causing significant fatigue. - he lowered lopressor back to 50mg  bid. Symptoms have improved.  - seen by EP, did not meet criteria to consider watchman device    - no recent palpitations - compliant with med, no bleeding on coumadin  2. Carotid stenosis - 02/2015 carotid US: bilateral 1-39% ICA disease.  3. Hyperlipidemia - joint pains on simvaststin, atorvastatin, rosuvastatin, pravastatin. - he is compliant with zetia - labs followed by pcp  01/2020 TC 224 TG 112 HDL 36 LDL 168 - compliant with zetia   4. CAD - mild disease by cath 2009 - denies any chest pains  5. LE edema -reports singificant bilateral leg edema over the last few weeks. Denies any SOB or orthopnea. Weight is up 6 lbs since 04/2018. - Jan 2020 LVEF 61-44%,RXVQMG eval diastolic function due to afib but sever biatrial enlargements is suggestive.   - no recent edema. No longer taking lasix, caused him to urinate too much. Previously was taking just prn, without edema has done ok   6. 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 Vertigo - followed by neurology     SH: retired [name].Enjoys cutting wood at home.. Often goes to [employer]   Past Medical History:  Diagnosis Date  . Anxiety disorder   . Benign prostatic hypertrophy   . Bilateral cataracts   . CAD (coronary artery disease)    Catheterization 2009, mild CAD  . Carotid artery disease ([name])    Doppler, September, 2011, less than 50% bilateral  . COPD (chronic obstructive  pulmonary disease) ([name])   . CVA (cerebral infarction)    Hospitalization.  MRI...    Left anterior cerebral artery territory, September, 2011... Coumadin started  . DYSLIPIDEMIA 11/01/2009   Qualifier: Diagnosis of  By: Sean Parker, MD, [name]   . Ejection fraction    EF 60%  . Ejection fraction    EF 55-60%, echo, September, 2011  . Gastroesophageal reflux disease   . Hyperlipidemia   . Persistent atrial fibrillation ([name])       . Ptosis of eyelid, left    Surgery in the past  . Sweating abnormality    Episodes of sweating appeared to be related to a delayed reaction to niacin  . Vitamin D insufficiency      Allergies  Allergen Reactions  . Asa [Aspirin]     GI upset  . Niacin     REACTION: hot flashes     Current Outpatient Medications  Medication Sig Dispense Refill  . albuterol (PROVENTIL) (2.5 MG/3ML) 0.083% nebulizer solution Take 3 mLs (2.5 mg total) by nebulization every 6 (six) hours as needed for wheezing or shortness of breath. 72 mL 0  . albuterol (VENTOLIN HFA) 108 (90 Base) MCG/ACT inhaler Inhale 2 puffs into the lungs every 6 (six) hours as needed for wheezing or shortness of breath. 18 g 11  . Budeson-Glycopyrrol-Formoterol (BREZTRI AEROSPHERE) 160-9-4.8 MCG/ACT AERO Inhale 2 puffs into the lungs in the morning and at bedtime. 10.7 g 12  . diazepam (VALIUM) 2  MG tablet Take 1 tablet (2 mg total) by mouth every 8 (eight) hours as needed (dizziness). 30 tablet 0  . ezetimibe (ZETIA) 10 MG tablet Take 1 tablet (10 mg total) by mouth daily. 90 tablet 3  . meclizine (ANTIVERT) 25 MG tablet Take 1 tablet (25 mg total) by mouth 3 (three) times daily as needed for dizziness. 30 tablet 2  . methocarbamol (ROBAXIN) 500 MG tablet Take 1 tablet (500 mg total) by mouth every 8 (eight) hours as needed for muscle spasms. 30 tablet 0  . metoprolol tartrate (LOPRESSOR) 25 MG tablet TAKE 1 AND 1/2 TABLETS TWICE DAILY ( NEW DOSE ) 270 tablet 1  . omeprazole (PRILOSEC) 20 MG  capsule Take 1 capsule (20 mg total) by mouth daily. 90 capsule 1  . traZODone (DESYREL) 50 MG tablet Take 1 tablet (50 mg total) by mouth at bedtime. 90 tablet 2  . Vitamin D, Ergocalciferol, (DRISDOL) 1.25 MG (50000 UNIT) CAPS capsule Take 1 capsule (50,000 Units total) by mouth every 7 (seven) days. 12 capsule 2  . warfarin (COUMADIN) 5 MG tablet TAKE 1 TABLET DAILY EXCEPT TAKE 1/2 TABLET ON TUESDAYS AND THURSDAYS OR AS DIRECTD 90 tablet 3   No current facility-administered medications for this visit.     Past Surgical History:  Procedure Laterality Date  . APPENDECTOMY    . CATARACT EXTRACTION, BILATERAL    . KNEE SURGERY Right 2019   torn ligament?   . TRANSURETHRAL RESECTION OF PROSTATE N/A 08/19/2018   Procedure: TRANSURETHRAL RESECTION OF THE PROSTATE (TURP) OF PROSTATE ABCESS;  Surgeon: Sean Frock, MD;  Location: [facility];  Service: Urology;  Laterality: N/A;     Allergies  Allergen Reactions  . Asa [Aspirin]     GI upset  . Niacin     REACTION: hot flashes      Family History  Problem Relation Age of Onset  . Heart attack Mother   . Other Other        Insignificant for premature CAD  . Heart disease Sister   . Hyperlipidemia Sister   . Kidney disease Son   . Heart attack Paternal Grandfather      Social History [name] reports that he quit smoking about 30 years ago. His smoking use included cigarettes. He has a 150.00 pack-year smoking history. He quit smokeless tobacco use about 19 years ago.  His smokeless tobacco use included chew. [name] reports no history of alcohol use.   Review of Systems CONSTITUTIONAL: No weight loss, fever, chills, weakness or fatigue.  HEENT: Eyes: No visual loss, blurred vision, double vision or yellow sclerae.No hearing loss, sneezing, congestion, runny nose or sore throat.  SKIN: No rash or itching.  CARDIOVASCULAR: per hpi RESPIRATORY: No shortness of breath, cough or sputum.  GASTROINTESTINAL: No anorexia,  nausea, vomiting or diarrhea. No abdominal pain or blood.  GENITOURINARY: No burning on urination, no polyuria NEUROLOGICAL: No headache, dizziness, syncope, paralysis, ataxia, numbness or tingling in the extremities. No change in bowel or bladder control.  MUSCULOSKELETAL: No muscle, back pain, joint pain or stiffness.  LYMPHATICS: No enlarged nodes. No history of splenectomy.  PSYCHIATRIC: No history of depression or anxiety.  ENDOCRINOLOGIC: No reports of sweating, cold or heat intolerance. No polyuria or polydipsia.  Marland Kitchen   Physical Examination Today's Vitals   03/18/20 0840  BP: 124/72  Pulse: 61  SpO2: 98%  Weight: 178 lb (80.7 kg)  Height: 5\' 6"  (1.676 m)   Body mass index is 28.73  kg/m.  Gen: resting comfortably, no acute distress HEENT: no scleral icterus, pupils equal round and reactive, no palptable cervical adenopathy,  CV: irreg, no m/r/g, no jvd Resp: Clear to auscultation bilaterally GI: abdomen is soft, non-tender, non-distended, normal bowel sounds, no hepatosplenomegaly MSK: extremities are warm, no edema.  Skin: warm, no rash Neuro:  no focal deficits Psych: appropriate affect   Diagnostic Studies 02/2015 carotid US: bilateral 1-39% ICA disease. Recs for f/u 2 years  02/2008 cath FINDINGS: The aortic pressure 98/71 with a mean of 83, left ventricular pressure 99/20.  Coronary angiography. The left mainstem is very short. There is no stenosis present.  The LAD is a large-caliber vessel that courses down and reaches the LV apex. The anatomy is a bit unusual as there is a Ceasar Decandia off the proximal LAD that runs the course of an intermediate. It supplies multiple Kitrina Maurin vessels to the lateral wall. There has been more of a traditional appearing diagonal [name] Johndrow from further down in the mid-LAD. The LAD at the origin of the second diagonal Raydell Maners has a 30% stenosis. Further down in the vessel, there is minor nonobstructive plaque but  no significant stenosis in the LAD or its Jannell Franta vessels. There are two diagonals in the intermediate territory Shalicia Craghead but all arising from the proximal and mid LAD.  The left circumflex is very small. It courses down the AV groove and supplies no significant Sederick Jacobsen vessels.  Right coronary artery. The right coronary artery is dominant. Proximally, there is an eccentric 50% stenosis. This is a smooth appearing lesion of the vessels. The midportion of the right coronary artery is dilated. There is a transition zone into more normal- appearing vessel where there is a 30% stenosis. There is a large acute marginal Jasmin Winberry and a moderate size PDA and posterolateral Kollyns Mickelson. There are no other significant stenoses noted. With intracoronary nitroglycerin, the lesion still appears no worse than 50%.  Left ventriculography shows low normal LV function with an LVEF in the range of 50-55%. There was no significant mitral regurgitation.  ASSESSMENT: 1. Mild-to-moderate right coronary artery stenosis as outlined above. 2. Minimal left anterior descending and left circumflex stenosis. 3. Normal left ventricular function.  Post cat PLAN: Recommend medical therapy for nonobstructive CAD. The patient will resume Coumadin tonight. He will drop his aspirin dose to 81 mg. In reviewing Dr. Ron [name] notes, the patient may not require long-term Coumadin. Recommend aggressive secondary risk reduction for the patient's diffuse nonobstructive CAD.   08/2016 event monitor  Telemetry tracings show rate controlled atrial fibrillation  No symptoms reported    Assessment and Plan  1. Chronic afib - DOACs have been too expensive, he prefers staying on coumadin - no symptoms, continue current meds - EKG today shows rate controlled afib  2. CAD - mild disease on prior cath -denies symptmos, continue to monitor  3. HYperlipidemia -intolerant to multiple  statins,has been on zetia - LDL not at goal. Mild CAD by prior cath, prior CVA. Will refer to lipid clinic to consider pcsk9 inhibitor         Arnoldo Lenis, M.D.

## 2020-03-18 NOTE — Patient Instructions (Signed)
Your physician wants you to follow-up in: [place] will receive a reminder letter in the mail two months in advance. If you don't receive a letter, please call our office to schedule the follow-up appointment.  Your physician recommends that you continue on your current medications as directed. Please refer to the Current Medication list given to you today.  You have been referred to LIPID CLINIC   Thank you for choosing [facility]!!

## 2020-03-22 DIAGNOSIS — H02214 Cicatricial lagophthalmos left upper eyelid: Secondary | ICD-10-CM | POA: Insufficient documentation

## 2020-03-22 DIAGNOSIS — H18452 Nodular corneal degeneration, left eye: Secondary | ICD-10-CM | POA: Insufficient documentation

## 2020-03-22 DIAGNOSIS — H02059 Trichiasis without entropian unspecified eye, unspecified eyelid: Secondary | ICD-10-CM | POA: Insufficient documentation

## 2020-04-05 ENCOUNTER — Ambulatory Visit: Payer: Medicare Other

## 2020-04-06 ENCOUNTER — Telehealth: Payer: Self-pay

## 2020-04-06 NOTE — Telephone Encounter (Signed)
lmom to r/s due to provider emergency

## 2020-04-11 ENCOUNTER — Other Ambulatory Visit: Payer: Self-pay

## 2020-04-11 ENCOUNTER — Ambulatory Visit (HOSPITAL_COMMUNITY)
Admission: RE | Admit: 2020-04-11 | Discharge: 2020-04-11 | Disposition: A | Discharge status: Home / Self Care | Payer: Medicare Other | Source: Ambulatory Visit | Attending: Family Medicine | Admitting: Family Medicine

## 2020-04-11 DIAGNOSIS — R9389 Abnormal findings on diagnostic imaging of other specified body structures: Secondary | ICD-10-CM | POA: Diagnosis present

## 2020-04-11 DIAGNOSIS — R5383 Other fatigue: Secondary | ICD-10-CM | POA: Diagnosis not present

## 2020-04-11 DIAGNOSIS — R918 Other nonspecific abnormal finding of lung field: Secondary | ICD-10-CM | POA: Diagnosis present

## 2020-04-15 ENCOUNTER — Ambulatory Visit: Payer: Medicare Other

## 2020-04-15 ENCOUNTER — Telehealth: Payer: Self-pay

## 2020-04-15 DIAGNOSIS — R9389 Abnormal findings on diagnostic imaging of other specified body structures: Secondary | ICD-10-CM

## 2020-04-15 NOTE — Telephone Encounter (Signed)
Patient aware.

## 2020-04-15 NOTE — Telephone Encounter (Signed)
Ct of chest is basically unchageg- need to repeat in 3-4 months

## 2020-04-15 NOTE — Telephone Encounter (Signed)
Covering PCP please review and advise  

## 2020-04-15 NOTE — Telephone Encounter (Signed)
Pt returned call for CT Chest result. Reviewed MMM note with pt about result. Pt wants to know if the CT scan was unchanged from the last one he had done, does this mean he has scar tissue? Pt worried about his chest.   Also wants to know if someone is going to schedule him to have another CT scan done in 3-4 mths?

## 2020-04-18 NOTE — Telephone Encounter (Signed)
CT chest mostly inchanged. He has an area of trapped air , but cant excleude nodule there. Want to repeat CT scan on 3 months

## 2020-04-19 NOTE — Telephone Encounter (Signed)
Pt informed of Mary Margaret's response on voicemail. Informed that we would contact referral department to schedule another CT in 49m

## 2020-04-20 NOTE — Telephone Encounter (Signed)
Pt returned missed call regarding CT Scan results. Reviewed results with pt per MMM's note. Pt says he doesn't understand what that means and wants to speak directly with MMM.  Wants MMM to call him on his cell phone @ [phone]

## 2020-04-20 NOTE — Telephone Encounter (Signed)
Lmtcb.

## 2020-04-28 ENCOUNTER — Ambulatory Visit (INDEPENDENT_AMBULATORY_CARE_PROVIDER_SITE_OTHER): Payer: Medicare Other | Admitting: Pharmacist Clinician (PhC)/ Clinical Pharmacy Specialist

## 2020-04-28 DIAGNOSIS — E782 Mixed hyperlipidemia: Secondary | ICD-10-CM

## 2020-04-28 DIAGNOSIS — I2510 Atherosclerotic heart disease of native coronary artery without angina pectoris: Secondary | ICD-10-CM

## 2020-04-28 MED ORDER — ROSUVASTATIN CALCIUM 10 MG PO TABS
ORAL_TABLET | ORAL | 3 refills | Status: DC
Start: 2020-04-28 — End: 2020-08-05

## 2020-04-28 NOTE — Patient Instructions (Signed)
Your Results:             Your most recent labs Goal  Total Cholesterol 224 < 200  Triglycerides 112 < 150  HDL (happy/good cholesterol) 36 > 40  LDL (lousy/bad cholesterol 168 < 70    Medication changes:  Start paperwork to get Repatha from the manufacturer.  Bring this back to [place] as soon as possible and we will submit to see if you qualify for free medication.    Start rosuvastatin 10 mg once weekly.  If you tolerate this, increase to twice weekly in mid-December and continue at this dose.  Continue with ezetimibe 10 mg daily  Lab orders:  If you do well with the rosuvastatin, repeat cholesterol labs in late January.    Patient Assistance:  The [employer] foundation offers assistance to help pay for medication copays.  They will cover copays for all cholesterol lowering meds, including statins, fibrates, omega-3 oils, ezetimibe, Repatha, Praluent, Nexletol, Nexlizet.  The cards are usually good for $2,500 or 12 months, whichever comes first. 1. Go to healthwellfoundation.org 2. Click on "Apply Now" 3. Answer questions as to whom is applying (patient or representative) 4. Your disease fund will be "hypercholesterolemia - Medicare access" 5. They will ask questions about finances and which medications you are taking for cholesterol 6. When you submit, the approval is usually within minutes.  You will need to print the card information from the site 7. You will need to show this information to your pharmacy, they will bill your Medicare Part D plan first -then bill [employer] Well --for the copay.   You can also call them at [phone], although the hold times can be quite long.   Thank you for choosing [facility]

## 2020-04-28 NOTE — Progress Notes (Signed)
05/02/2020 [name] 14-Mar-1941 [id number]   HPI:  [name] is a 79 y.o. male patient of Dr Harl Bowie, who presents today for a lipid clinic evaluation.  See pertinent past medical history below.  He is here today with his wife to discuss options for cholesterol lowering, having not done well with statin drugs in the past.   Noted in chart that he takes warfarin for his atrial fibrillation, due to the prohibitive cost associated with DOAC's.  I would suspect the pricing on PCKS-9 inhibitors to be about the same.  His Part D drug plan has a $445 deductible for branded products.    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 72 mL 0   albuterol (VENTOLIN HFA) 108 (90 Base) MCG/ACT inhaler Inhale 2 puffs into the lungs every 6 (six) hours as needed for wheezing or shortness of breath. 18 g 11   Budeson-Glycopyrrol-Formoterol (BREZTRI AEROSPHERE) 160-9-4.8 MCG/ACT AERO Inhale 2 puffs into the lungs in the morning and at bedtime. 10.7 g 12   diazepam (VALIUM) 2 MG tablet Take 1 tablet (2 mg total) by mouth every 8 (eight) hours as needed (dizziness). 30 tablet 0   ezetimibe (ZETIA) 10 MG tablet Take 1 tablet (10 mg total) by mouth  daily. 90 tablet 3   meclizine (ANTIVERT) 25 MG tablet Take 1 tablet (25 mg total) by mouth 3 (three) times daily as needed for dizziness. 30 tablet 2   methocarbamol (ROBAXIN) 500 MG tablet Take 1 tablet (500 mg total) by mouth every 8 (eight) hours as needed for muscle spasms. 30 tablet 0   omeprazole (PRILOSEC) 20 MG capsule Take 1 capsule (20 mg total) by mouth daily. 90 capsule 1   traZODone (DESYREL) 50 MG tablet Take 1 tablet (50 mg total) by mouth at bedtime. 90 tablet 2   Vitamin D, Ergocalciferol, (DRISDOL) 1.25 MG (50000 UNIT) CAPS capsule Take 1 capsule (50,000 Units total) by mouth every 7 (seven) days. 12 capsule 2   warfarin (COUMADIN) 5 MG tablet TAKE 1 TABLET DAILY EXCEPT TAKE 1/2 TABLET ON TUESDAYS AND THURSDAYS OR AS DIRECTD 90 tablet 3   metoprolol tartrate (LOPRESSOR) 25 MG tablet TAKE 1 AND 1/2 TABLETS TWICE DAILY ( NEW DOSE ) 270 tablet 3   rosuvastatin (CRESTOR) 10 MG tablet Take 1 tablet up to three times per week as tolerated 36 tablet 3   No current facility-administered medications for this visit.    Allergies  Allergen Reactions   Asa [Aspirin]     GI upset   Niacin     REACTION: hot flashes   Pravastatin     myalgias   Rosuvastatin     myalgias   Simvastatin     Myalgias     Past Medical History:  Diagnosis Date   Anxiety disorder    Benign prostatic hypertrophy    Bilateral cataracts    CAD (coronary artery disease)    Catheterization 2009, mild CAD   Carotid artery disease (HCC)    Doppler, September, 2011, less than 50% bilateral   COPD (chronic obstructive pulmonary disease) (HCC)    CVA (cerebral infarction)    Hospitalization.  MRI...    Left anterior cerebral artery territory, September, 2011... Coumadin started   DYSLIPIDEMIA 11/01/2009   Qualifier: Diagnosis of  By: Ron Parker, MD, [facility], [place]    Ejection fraction    EF 60%   Ejection fraction    EF 55-60%, echo, September, 2011   Gastroesophageal reflux  disease    Hyperlipidemia    Persistent atrial fibrillation (HCC)        Ptosis of eyelid, left    Surgery in the past   Sweating abnormality    Episodes of sweating appeared to be related to a delayed reaction to niacin   Vitamin D insufficiency     Blood pressure 140/72, pulse 64, resp. rate 14, height 5\' 6"  (1.676 m), weight 175 lb 9.6 oz (79.7 kg), SpO2 96 %.   HLD (hyperlipidemia) Patient with prior CVA and elevated LDL, not able to tolerate daily statin use.  He currently is on only ezetimibe 10 mg daily.  Will have him re-challenge with rosuvastatin 10 mg once weekly, and can increase to twice weekly after a month if no issues.  Previously we have been able to get patients free PCSK-9 inhibitors thru the [employer] foundation - however this option is currently closed.  We had patient fill out paperwork to apply for [place] today in the office.  Will submit this to [employer] and see if we can get medication thru them.     Tommy Medal PharmD CPP [facility] [address] [place], [place] [address] [phone]

## 2020-04-29 ENCOUNTER — Other Ambulatory Visit: Payer: Self-pay | Admitting: Cardiology

## 2020-05-02 ENCOUNTER — Ambulatory Visit (INDEPENDENT_AMBULATORY_CARE_PROVIDER_SITE_OTHER): Payer: Medicare Other | Admitting: *Deleted

## 2020-05-02 ENCOUNTER — Encounter: Payer: Self-pay | Admitting: Pharmacist Clinician (PhC)/ Clinical Pharmacy Specialist

## 2020-05-02 DIAGNOSIS — Z5181 Encounter for therapeutic drug level monitoring: Secondary | ICD-10-CM | POA: Diagnosis not present

## 2020-05-02 DIAGNOSIS — I482 Chronic atrial fibrillation, unspecified: Secondary | ICD-10-CM | POA: Diagnosis not present

## 2020-05-02 DIAGNOSIS — I4891 Unspecified atrial fibrillation: Secondary | ICD-10-CM

## 2020-05-02 LAB — POCT INR: INR: 2.8 (ref 2.0–3.0)

## 2020-05-02 NOTE — Assessment & Plan Note (Signed)
Patient with prior CVA and elevated LDL, not able to tolerate daily statin use.  He currently is on only ezetimibe 10 mg daily.  Will have him re-challenge with rosuvastatin 10 mg once weekly, and can increase to twice weekly after a month if no issues.  Previously we have been able to get patients free PCSK-9 inhibitors thru the [employer] foundation - however this option is currently closed.  We had patient fill out paperwork to apply for [place] today in the office.  Will submit this to [employer] and see if we can get medication thru them.

## 2020-05-02 NOTE — Patient Instructions (Signed)
Continue warfarin 1 tablet daily except 1/2 tablet on Tuesdays and Fridays Recheck in 6 weeks

## 2020-05-08 DIAGNOSIS — H02886 Meibomian gland dysfunction of left eye, unspecified eyelid: Secondary | ICD-10-CM | POA: Insufficient documentation

## 2020-05-08 DIAGNOSIS — Q100 Congenital ptosis: Secondary | ICD-10-CM | POA: Insufficient documentation

## 2020-05-08 DIAGNOSIS — H02883 Meibomian gland dysfunction of right eye, unspecified eyelid: Secondary | ICD-10-CM | POA: Insufficient documentation

## 2020-05-26 ENCOUNTER — Telehealth: Payer: Self-pay

## 2020-05-26 NOTE — Telephone Encounter (Signed)
Called and lmomed the pt stated that they need to contact the [place] to give them the missing information

## 2020-06-01 ENCOUNTER — Telehealth: Payer: Self-pay | Admitting: Pharmacist

## 2020-06-01 MED ORDER — BREZTRI AEROSPHERE 160-9-4.8 MCG/ACT IN AERO: 2.0000 | INHALATION_SPRAY | Freq: Two times a day (BID) | RESPIRATORY_TRACT | 12 refills | Status: DC

## 2020-06-01 NOTE — Telephone Encounter (Signed)
Breztri patient assistance re-enrollment 2022  Patient re-enrolled in [place] patient assistance program for [name] for 0044 Application filled out online Medication will ship to patient's home RX escribed to [facility] with 3 refills

## 2020-06-14 ENCOUNTER — Other Ambulatory Visit: Payer: Self-pay

## 2020-06-14 ENCOUNTER — Ambulatory Visit (INDEPENDENT_AMBULATORY_CARE_PROVIDER_SITE_OTHER): Payer: Medicare Other | Admitting: *Deleted

## 2020-06-14 DIAGNOSIS — I4891 Unspecified atrial fibrillation: Secondary | ICD-10-CM

## 2020-06-14 DIAGNOSIS — I482 Chronic atrial fibrillation, unspecified: Secondary | ICD-10-CM | POA: Diagnosis not present

## 2020-06-14 DIAGNOSIS — Z5181 Encounter for therapeutic drug level monitoring: Secondary | ICD-10-CM | POA: Diagnosis not present

## 2020-06-14 LAB — POCT INR: INR: 3.9 — AB (ref 2.0–3.0)

## 2020-06-14 NOTE — Patient Instructions (Signed)
Hold warfarin today, take 1/2 tablet tomorrow then resume 1 tablet daily except 1/2 tablet on Tuesdays and Fridays Recheck in 3 weeks

## 2020-06-16 DIAGNOSIS — U071 COVID-19: Secondary | ICD-10-CM | POA: Diagnosis not present

## 2020-06-27 ENCOUNTER — Other Ambulatory Visit: Payer: Self-pay

## 2020-06-27 ENCOUNTER — Emergency Department (HOSPITAL_COMMUNITY)
Admission: EM | Admit: 2020-06-27 | Discharge: 2020-06-27 | Disposition: A | Discharge status: Home / Self Care | Payer: Medicare Other | Attending: Emergency Medicine | Admitting: Emergency Medicine

## 2020-06-27 ENCOUNTER — Ambulatory Visit (INDEPENDENT_AMBULATORY_CARE_PROVIDER_SITE_OTHER): Payer: Medicare Other | Admitting: Nurse Practitioner

## 2020-06-27 ENCOUNTER — Encounter: Payer: Self-pay | Admitting: Nurse Practitioner

## 2020-06-27 VITALS — BP 127/78 | HR 75 | Temp 97.2°F | Resp 20 | Ht 66.0 in | Wt 167.0 lb

## 2020-06-27 DIAGNOSIS — K4090 Unilateral inguinal hernia, without obstruction or gangrene, not specified as recurrent: Secondary | ICD-10-CM | POA: Insufficient documentation

## 2020-06-27 DIAGNOSIS — K219 Gastro-esophageal reflux disease without esophagitis: Secondary | ICD-10-CM | POA: Insufficient documentation

## 2020-06-27 DIAGNOSIS — I2510 Atherosclerotic heart disease of native coronary artery without angina pectoris: Secondary | ICD-10-CM | POA: Diagnosis not present

## 2020-06-27 DIAGNOSIS — K4030 Unilateral inguinal hernia, with obstruction, without gangrene, not specified as recurrent: Secondary | ICD-10-CM | POA: Diagnosis not present

## 2020-06-27 DIAGNOSIS — Z87891 Personal history of nicotine dependence: Secondary | ICD-10-CM | POA: Diagnosis not present

## 2020-06-27 DIAGNOSIS — Z79899 Other long term (current) drug therapy: Secondary | ICD-10-CM | POA: Insufficient documentation

## 2020-06-27 DIAGNOSIS — N182 Chronic kidney disease, stage 2 (mild): Secondary | ICD-10-CM | POA: Insufficient documentation

## 2020-06-27 DIAGNOSIS — I4819 Other persistent atrial fibrillation: Secondary | ICD-10-CM | POA: Insufficient documentation

## 2020-06-27 DIAGNOSIS — Z8719 Personal history of other diseases of the digestive system: Secondary | ICD-10-CM | POA: Insufficient documentation

## 2020-06-27 DIAGNOSIS — Z7901 Long term (current) use of anticoagulants: Secondary | ICD-10-CM | POA: Diagnosis not present

## 2020-06-27 DIAGNOSIS — J449 Chronic obstructive pulmonary disease, unspecified: Secondary | ICD-10-CM | POA: Diagnosis not present

## 2020-06-27 NOTE — Patient Instructions (Signed)
Inguinal Hernia, Adult An inguinal hernia is when fat or your intestines push through a weak spot in a muscle where your leg meets your lower belly (groin). This causes a bulge. This kind of hernia could also be:  In your scrotum, if you are male.  In folds of skin around your vagina, if you are male. There are three types of inguinal hernias:  Hernias that can be pushed back into the belly (are reducible). This type rarely causes pain.  Hernias that cannot be pushed back into the belly (are incarcerated).  Hernias that cannot be pushed back into the belly and lose their blood supply (are strangulated). This type needs emergency surgery. What are the causes? This condition is caused by having a weak spot in the muscles or tissues in your groin. This develops over time. The hernia may poke through the weak spot when you strain your lower belly muscles all of a sudden, such as when you:  Lift a heavy object.  Strain to poop (have a bowel movement). Trouble pooping (constipation) can lead to straining.  Cough. What increases the risk? This condition is more likely to develop in:  Males.  Pregnant females.  People who: ? Are overweight. ? Work in jobs that require long periods of standing or heavy lifting. ? Have had an inguinal hernia before. ? Smoke or have lung disease. These factors can lead to long-term (chronic) coughing. What are the signs or symptoms? Symptoms may depend on the size of the hernia. Often, a small hernia has no symptoms. Symptoms of a larger hernia may include:  A bulge in the groin area. This is easier to see when standing. You might not be able to see it when you are lying down.  Pain or burning in the groin. This may get worse when you lift, strain, or cough.  A dull ache or a feeling of pressure in the groin.  An abnormal bulge in the scrotum, in males. Symptoms of a strangulated inguinal hernia may include:  A bulge in your groin that is very  painful and tender to the touch.  A bulge that turns red or purple.  Fever, feeling like you may vomit (nausea), and vomiting.  Not being able to poop or to pass gas. How is this treated? Treatment depends on the size of your hernia and whether you have symptoms. If you do not have symptoms, your doctor may have you watch your hernia carefully and have you come in for follow-up visits. If your hernia is large or if you have symptoms, you may need surgery to repair the hernia. Follow these instructions at home: Lifestyle  Avoid lifting heavy objects.  Avoid standing for long amounts of time.  Do not smoke or use any products that contain nicotine or tobacco. If you need help quitting, ask your doctor.  Stay at a healthy weight. Prevent trouble pooping You may need to take these actions to prevent or treat trouble pooping:  Drink enough fluid to keep your pee (urine) pale yellow.  Take over-the-counter or prescription medicines.  Eat foods that are high in fiber. These include beans, whole grains, and fresh fruits and vegetables.  Limit foods that are high in fat and sugar. These include fried or sweet foods. General instructions  You may try to push your hernia back in place by very gently pressing on it when you are lying down. Do not try to push the bulge back in if it will not go in   easily.  Watch your hernia for any changes in shape, size, or color. Tell your doctor if you see any changes.  Take over-the-counter and prescription medicines only as told by your doctor.  Keep all follow-up visits. Contact a doctor if:  You have a fever or chills.  You have new symptoms.  Your symptoms get worse. Get help right away if:  You have pain in your groin that gets worse all of a sudden.  You have a bulge in your groin that: ? Gets bigger all of a sudden, and it does not get smaller after that. ? Turns red or purple. ? Is painful when you touch it.  You are a male, and  you have: ? Sudden pain in your scrotum. ? A sudden change in the size of your scrotum.  You cannot push the hernia back in place by very gently pressing on it when you are lying down.  You feel like you may vomit, and that feeling does not go away.  You keep vomiting.  You have a fast heartbeat.  You cannot poop or pass gas. These symptoms may be an emergency. Get help right away. Call your local emergency services (911 in the [place]  Do not wait to see if the symptoms will go away.  Do not drive yourself to the hospital. Summary  An inguinal hernia is when fat or your intestines push through a weak spot in a muscle where your leg meets your lower belly (groin). This causes a bulge.  If you do not have symptoms, you may not need treatment. If you have symptoms or a large hernia, you may need surgery.  Avoid lifting heavy objects. Also, avoid standing for long amounts of time.  Do not try to push the bulge back in if it will not go in easily. This information is not intended to replace advice given to you by your health care provider. Make sure you discuss any questions you have with your health care provider. Document Revised: 02/02/2020 Document Reviewed: 02/02/2020 Elsevier Patient Education  2021 Elsevier Inc.  

## 2020-06-27 NOTE — Addendum Note (Signed)
Addended by: Chevis Pretty on: 06/27/2020 11:56 AM   Modules accepted: Orders

## 2020-06-27 NOTE — ED Notes (Signed)
Hernia on lift side reduced by Dr Constance Haw

## 2020-06-27 NOTE — Consult Note (Addendum)
[facility] Consult  Reason for Consult: Incarcerated left inguinal hernia  Referring Physician:  ED/ PCP   Chief Complaint    Inguinal Hernia      HPI: [name] is a 80 y.o. male with an incarcerated inguinal hernia on the left. He was seen at his PCP office and they could not get the area to reduce. He says he never knew he had a hernia but reports some swelling and pain in the area since Friday. He has been having regular BMs and no nausea or vomiting. He was able to ambulate in the ED to the room but says the area is tender.  He has a prior CT from June 2021 where I see a hernia that contains colon on the left but this was not called on his read.   Past Medical History:  Diagnosis Date  . Anxiety disorder   . Benign prostatic hypertrophy   . Bilateral cataracts   . CAD (coronary artery disease)    Catheterization 2009, mild CAD  . Carotid artery disease ([place])    Doppler, September, 2011, less than 50% bilateral  . COPD (chronic obstructive pulmonary disease) ([place])   . CVA (cerebral infarction)    Hospitalization.  MRI...    Left anterior cerebral artery territory, September, 2011... Coumadin started  . DYSLIPIDEMIA 11/01/2009   Qualifier: Diagnosis of  By: Ron Parker, MD, [facility] [place]   . Ejection fraction    EF 60%  . Ejection fraction    EF 55-60%, echo, September, 2011  . Gastroesophageal reflux disease   . Hyperlipidemia   . Persistent atrial fibrillation ([place])       . Ptosis of eyelid, left    Surgery in the past  . Sweating abnormality    Episodes of sweating appeared to be related to a delayed reaction to niacin  . Vitamin D insufficiency     Past Surgical History:  Procedure Laterality Date  . APPENDECTOMY    . CATARACT EXTRACTION, BILATERAL    . KNEE SURGERY Right 2019   torn ligament?   . TRANSURETHRAL RESECTION OF PROSTATE N/A 08/19/2018   Procedure: TRANSURETHRAL RESECTION OF THE PROSTATE (TURP) OF PROSTATE ABCESS;  Surgeon:  Alexis Frock, MD;  Location: [facility];  Service: Urology;  Laterality: N/A;    Family History  Problem Relation Age of Onset  . Heart attack Mother   . Other Other        Insignificant for premature CAD  . Heart disease Sister   . Hyperlipidemia Sister   . Kidney disease Son   . Heart attack Paternal Grandfather     Social History   Tobacco Use  . Smoking status: Former Smoker    Packs/day: 3.00    Years: 50.00    Pack years: 150.00    Types: Cigarettes    Quit date: 03/18/1990    Years since quitting: 30.2  . Smokeless tobacco: Former Systems developer    Types: [place] date: 06/18/2000  Vaping Use  . Vaping Use: Never used  Substance Use Topics  . Alcohol use: No    Alcohol/week: 0.0 standard drinks  . Drug use: No    Medications: I have reviewed the patient's current medications. No current facility-administered medications for this encounter.   Current Outpatient Medications  Medication Sig Dispense Refill Last Dose  . albuterol (PROVENTIL) (2.5 MG/3ML) 0.083% nebulizer solution Take 3 mLs (2.5 mg total) by nebulization every 6 (six) hours as needed for wheezing  or shortness of breath. 72 mL 0   . albuterol (VENTOLIN HFA) 108 (90 Base) MCG/ACT inhaler Inhale 2 puffs into the lungs every 6 (six) hours as needed for wheezing or shortness of breath. 18 g 11   . Budeson-Glycopyrrol-Formoterol (BREZTRI AEROSPHERE) 160-9-4.8 MCG/ACT AERO Inhale 2 puffs into the lungs in the morning and at bedtime. 42.8 g 12   . diazepam (VALIUM) 2 MG tablet Take 1 tablet (2 mg total) by mouth every 8 (eight) hours as needed (dizziness). 30 tablet 0   . ezetimibe (ZETIA) 10 MG tablet Take 1 tablet (10 mg total) by mouth daily. 90 tablet 3   . meclizine (ANTIVERT) 25 MG tablet Take 1 tablet (25 mg total) by mouth 3 (three) times daily as needed for dizziness. 30 tablet 2   . methocarbamol (ROBAXIN) 500 MG tablet Take 1 tablet (500 mg total) by mouth every 8 (eight) hours as needed for muscle spasms.  30 tablet 0   . metoprolol tartrate (LOPRESSOR) 25 MG tablet TAKE 1 AND 1/2 TABLETS TWICE DAILY ( NEW DOSE ) 270 tablet 3   . omeprazole (PRILOSEC) 20 MG capsule Take 1 capsule (20 mg total) by mouth daily. 90 capsule 1   . rosuvastatin (CRESTOR) 10 MG tablet Take 1 tablet up to three times per week as tolerated 36 tablet 3   . traZODone (DESYREL) 50 MG tablet Take 1 tablet (50 mg total) by mouth at bedtime. 90 tablet 2   . Vitamin D, Ergocalciferol, (DRISDOL) 1.25 MG (50000 UNIT) CAPS capsule Take 1 capsule (50,000 Units total) by mouth every 7 (seven) days. 12 capsule 2   . warfarin (COUMADIN) 5 MG tablet TAKE 1 TABLET DAILY EXCEPT TAKE 1/2 TABLET ON TUESDAYS AND THURSDAYS OR AS DIRECTD 90 tablet 3    Allergies  Allergen Reactions  . Asa [Aspirin]     GI upset  . Niacin     REACTION: hot flashes  . Pravastatin     myalgias  . Rosuvastatin     myalgias  . Simvastatin     Myalgias      ROS:  A comprehensive review of systems was negative except for: Gastrointestinal: positive for left groin bulge and tenderness  Blood pressure (!) 147/71, pulse 75, temperature 98.7 F (37.1 C), temperature source Oral, resp. rate 19, height 5\' 6"  (1.676 m), weight 75.8 kg, SpO2 98 %. Physical Exam Vitals reviewed.  Constitutional:      Appearance: He is normal weight.  HENT:     Head: Normocephalic.     Nose: Nose normal.     Mouth/Throat:     Mouth: Mucous membranes are moist.  Eyes:     Extraocular Movements: Extraocular movements intact.  Cardiovascular:     Rate and Rhythm: Normal rate.  Pulmonary:     Effort: Pulmonary effort is normal.  Abdominal:     General: There is no distension.     Palpations: Abdomen is soft.     Tenderness: There is abdominal tenderness.     Hernia: A hernia is present. Hernia is present in the left inguinal area.     Comments: Incarcerated hernia, reduced with gentle extended pressure, tenderness resolved   Musculoskeletal:        General: No  swelling. Normal range of motion.     Cervical back: Normal range of motion.  Skin:    General: Skin is warm.  Neurological:     General: No focal deficit present.     Mental Status:  He is alert and oriented to person, place, and time.  Psychiatric:        Mood and Affect: Mood normal.        Behavior: Behavior normal.        Thought Content: Thought content normal.        Judgment: Judgment normal.     Results: CT a/p 11/2019- colon in left inguinal hernia   Assessment & Plan:  [name] is a 80 y.o. male with an incarcerated inguinal hernia that was able to reduce with pressure. He did well. He has not had any obstructive symptoms. The are soft on exam. Warned about incarceration and strangulation. Warned for reasons to return to ED like vomiting, increased pain, hard bulge returning. He took his coumadin last night. No chest pain or SOB.  Will see in office 10:30AM tomorrow Will plan for surgery next week Hold coumadin now  Tolerated liquids after reduction  ED seeing patient now.  Future Appointments  Date Time Provider [place]  06/28/2020 10:45 AM Virl Cagey, MD [facility] None  07/05/2020  9:00 AM [facility]  09/29/2020 10:15 AM Irine Seal, MD [facility] None     All questions were answered to the satisfaction of the patient.  Virl Cagey 06/27/2020, 2:30 PM

## 2020-06-27 NOTE — ED Provider Notes (Signed)
[facility] EMERGENCY DEPARTMENT Provider Note   CSN: [id number] Arrival date & time: 06/27/20  1248     History Chief Complaint  Patient presents with  . Inguinal Hernia    [name] is a 80 y.o. 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  Patient states symptoms began on Friday with burning pain in the left groin.  His symptoms were somewhat made better with certain positions while lying in bed though had a constant pain.  He was causing him to have increased urinary frequency that felt similar to when he had enlarged prostate.  He had no nausea or vomiting, no constipation or fever.  He had swelling in his left groin.  PCP evaluated him today and is concerned for incarcerated inguinal hernia.  A consult was placed to general surgery, and patient sent here for evaluation.    Upon evaluation in the ED, general surgeon, Dr. Constance Haw, has already evaluated patient and reduced the inguinal hernia at the bedside. On evaluation, patient reports much improvement in symptoms.  Dr. Constance Haw recommended patient hold his Coumadin, and has scheduled him for appointment at 10:30 AM tomorrow morning for reevaluation and planning for possible surgical repair of his hernia next week.  She is also provided him with strict return precautions.  Patient feels some soreness in his left lower belly now, though otherwise feels much better.  The history is provided by the patient.       Past Medical History:  Diagnosis Date  . Anxiety disorder   . Benign prostatic hypertrophy   . Bilateral cataracts   . CAD (coronary artery disease)    Catheterization 2009, mild CAD  . Carotid artery disease ([place])    Doppler, September, 2011, less than 50% bilateral  . COPD (chronic obstructive pulmonary disease) ([place])   . CVA (cerebral infarction)    Hospitalization.   MRI...    Left anterior cerebral artery territory, September, 2011... Coumadin started  . DYSLIPIDEMIA 11/01/2009   Qualifier: Diagnosis of  By: Ron Parker, MD, Leonidas Romberg Dorinda Hill   . Ejection fraction    EF 60%  . Ejection fraction    EF 55-60%, echo, September, 2011  . Gastroesophageal reflux disease   . Hyperlipidemia   . Persistent atrial fibrillation ([place])       . Ptosis of eyelid, left    Surgery in the past  . Sweating abnormality    Episodes of sweating appeared to be related to a delayed reaction to niacin  . Vitamin D insufficiency     Patient Active Problem List   Diagnosis Date Noted  . Incarcerated left inguinal hernia   . Vitamin D insufficiency   . Prediabetes 03/31/2018  . BPH (benign prostatic hyperplasia) 02/07/2017  . Chronic kidney disease (CKD), 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  . Vertigo 02/07/2017  . Chronic anticoagulation 02/27/2013  . 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 CAD (coronary artery disease)   . HLD (hyperlipidemia) 11/01/2009  . ATRIAL FIBRILLATION, CHRONIC 11/01/2009  . GERD 11/01/2009    Past Surgical History:  Procedure Laterality Date  . APPENDECTOMY    . CATARACT EXTRACTION, BILATERAL    . KNEE SURGERY Right 2019   torn ligament?   . TRANSURETHRAL RESECTION OF PROSTATE N/A 08/19/2018   Procedure: TRANSURETHRAL RESECTION OF THE PROSTATE (TURP) OF PROSTATE ABCESS;  Surgeon: Alexis Frock, MD;  Location: [facility] ORS;  Service: Urology;  Laterality: N/A;       Family History  Problem Relation Age of Onset  . Heart attack Mother   . Other Other        Insignificant for premature CAD  . Heart disease Sister   . Hyperlipidemia Sister   . Kidney disease Son   . Heart attack Paternal Grandfather     Social History   Tobacco Use  . Smoking status: Former Smoker    Packs/day: 3.00    Years: 50.00    Pack years: 150.00    Types: Cigarettes    Quit date: 03/18/1990    Years since quitting: 30.2  . Smokeless tobacco: Former  Systems developer    Types: [place] date: 06/18/2000  Vaping Use  . Vaping Use: Never used  Substance Use Topics  . Alcohol use: No    Alcohol/week: 0.0 standard drinks  . Drug use: No    Home Medications Prior to Admission medications   Medication Sig Start Date End Date Taking? Authorizing Provider  albuterol (PROVENTIL) (2.5 MG/3ML) 0.083% nebulizer solution Take 3 mLs (2.5 mg total) by nebulization every 6 (six) hours as needed for wheezing or shortness of breath. 11/06/19   Loman Brooklyn, FNP  albuterol (VENTOLIN HFA) 108 (90 Base) MCG/ACT inhaler Inhale 2 puffs into the lungs every 6 (six) hours as needed for wheezing or shortness of breath. 11/23/19   Loman Brooklyn, FNP  Budeson-Glycopyrrol-Formoterol (BREZTRI AEROSPHERE) 160-9-4.8 MCG/ACT AERO Inhale 2 puffs into the lungs in the morning and at bedtime. 06/01/20   Loman Brooklyn, FNP  diazepam (VALIUM) 2 MG tablet Take 1 tablet (2 mg total) by mouth every 8 (eight) hours as needed (dizziness). 02/12/20   Loman Brooklyn, FNP  ezetimibe (ZETIA) 10 MG tablet Take 1 tablet (10 mg total) by mouth daily. 02/01/20   Arnoldo Lenis, MD  meclizine (ANTIVERT) 25 MG tablet Take 1 tablet (25 mg total) by mouth 3 (three) times daily as needed for dizziness. 02/12/20   Loman Brooklyn, FNP  methocarbamol (ROBAXIN) 500 MG tablet Take 1 tablet (500 mg total) by mouth every 8 (eight) hours as needed for muscle spasms. 02/12/20   Loman Brooklyn, FNP  metoprolol tartrate (LOPRESSOR) 25 MG tablet TAKE 1 AND 1/2 TABLETS TWICE DAILY ( NEW DOSE ) 04/29/20   Arnoldo Lenis, MD  omeprazole (PRILOSEC) 20 MG capsule Take 1 capsule (20 mg total) by mouth daily. 11/06/19   Loman Brooklyn, FNP  rosuvastatin (CRESTOR) 10 MG tablet Take 1 tablet up to three times per week as tolerated 04/28/20   Arnoldo Lenis, MD  traZODone (DESYREL) 50 MG tablet Take 1 tablet (50 mg total) by mouth at bedtime. 02/12/20   Loman Brooklyn, FNP  Vitamin D, Ergocalciferol,  (DRISDOL) 1.25 MG (50000 UNIT) CAPS capsule Take 1 capsule (50,000 Units total) by mouth every 7 (seven) days. 11/06/19   Loman Brooklyn, FNP  warfarin (COUMADIN) 5 MG tablet TAKE 1 TABLET DAILY EXCEPT TAKE 1/2 TABLET ON TUESDAYS AND THURSDAYS OR AS DIRECTD 08/17/19   Arnoldo Lenis, MD    Allergies    Asa [aspirin], Niacin, Pravastatin, Rosuvastatin, and Simvastatin  Review of Systems   Review of Systems  All other systems reviewed and are negative.   Physical Exam Updated Vital Signs BP (!) 147/71 (BP Location: Right Arm)   Pulse 75   Temp 98.7 F (37.1 C) (Oral)  Resp 19   Ht 5\' 6"  (1.676 m)   Wt 75.8 kg   SpO2 98%   BMI 26.95 kg/m   Physical Exam Vitals and nursing note reviewed.  Constitutional:      Appearance: He is well-developed and well-nourished.  HENT:     Head: Normocephalic and atraumatic.  Eyes:     Conjunctiva/sclera: Conjunctivae normal.  Cardiovascular:     Rate and Rhythm: Normal rate.  Pulmonary:     Effort: Pulmonary effort is normal. No respiratory distress.     Breath sounds: Normal breath sounds.  Abdominal:     General: Bowel sounds are normal.     Palpations: Abdomen is soft.     Tenderness: There is no abdominal tenderness.  Skin:    General: Skin is warm.  Neurological:     Mental Status: He is alert.  Psychiatric:        Mood and Affect: Mood and affect normal.        Behavior: Behavior normal.     ED Results / Procedures / Treatments   Labs (all labs ordered are listed, but only abnormal results are displayed) Labs Reviewed - No data to display  EKG None  Radiology No results found.  Procedures Procedures (including critical care time)  Medications Ordered in ED Medications - No data to display  ED Course  I have reviewed the triage vital signs and the nursing notes.  Pertinent labs & imaging results that were available during my care of the patient were reviewed by me and considered in my medical decision  making (see chart for details).    MDM Rules/Calculators/A&P                          Patient sent in by PCP for left inguinal hernia which was reduced at the bedside by surgeon Dr. Constance Haw prior to my evaluation.  Patient has much improvement in symptoms.  No further work-up is recommended in the ED at this time.  Surgeon has provided him with strict return precautions.  His abdominal exam is benign and is agreeable with plan for discharge.  Surgery to see patient tomorrow morning for close follow-up.  He is provided with strict return precautions.  Answered all questions, patient discharged in no acute distress.  Discussed results, findings, treatment and follow up. Patient advised of return precautions. Patient verbalized understanding and agreed with plan.  Final Clinical Impression(s) / ED Diagnoses Final diagnoses:  Unilateral inguinal hernia without obstruction or gangrene, recurrence not specified    Rx / DC Orders ED Discharge Orders    None       Yong Wahlquist, [place] N, PA-C 06/27/20 1451    Milton Ferguson, MD 06/27/20 1510

## 2020-06-27 NOTE — ED Triage Notes (Signed)
Pt c/o left side inguinal hernia and was sent by his PCP for evaluation.

## 2020-06-27 NOTE — Progress Notes (Signed)
   Subjective:    Patient ID: [name], male    DOB: 1940-11-10, 80 y.o.   MRN: [id number]   Chief Complaint: Penis swellinf (Started Friday/)   HPI Patient comes in with his wife. He says that Friday evening he started having swelling near his pelvic bone on the left. Swelling stays in gorin area. He denioes ever being told he had a hernia. Has slowed his urine stream down.   Review of Systems  Constitutional: Negative.   HENT: Negative.   Respiratory: Negative.   Cardiovascular: Negative.   Gastrointestinal: Negative.   Genitourinary: Negative for dysuria, flank pain, penile discharge, penile pain, penile swelling, scrotal swelling and testicular pain.  All other systems reviewed and are negative.      Objective:   Physical Exam Vitals and nursing note reviewed.  Constitutional:      General: He is in acute distress.     Appearance: Normal appearance. He is normal weight.  Cardiovascular:     Rate and Rhythm: Normal rate and regular rhythm.     Pulses: Normal pulses.     Heart sounds: Normal heart sounds.  Pulmonary:     Breath sounds: Normal breath sounds.  Genitourinary:    Comments: Left  Inguinal mass grossly tender to touch- no reducible Neurological:     Mental Status: He is alert.    BP 127/78   Pulse 75   Temp (!) 97.2 F (36.2 C) (Temporal)   Resp 20   Ht 5\' 6"  (1.676 m)   Wt 167 lb (75.8 kg)   SpO2 97%   BMI 26.95 kg/m          Assessment & Plan:  [name] in today with chief complaint of Penis swellinf (Started Friday/)   1. Incarcerated left inguinal hernia Stat referral to general surgeon - Ambulatory referral to General Surgery    The above assessment and management plan was discussed with the patient. The patient verbalized understanding of and has agreed to the management plan. Patient is aware to call the clinic if symptoms persist or worsen. Patient is aware when to return to the clinic for a follow-up visit. Patient  educated on when it is appropriate to go to the emergency department.   Mary-Margaret Hassell Done, FNP

## 2020-06-27 NOTE — Discharge Instructions (Addendum)
Inguinal Hernia, Adult An inguinal hernia develops when fat or the intestines push through a weak spot in a muscle where the leg meets the lower abdomen (groin). This creates a bulge. This kind of hernia could also be:  In the scrotum, if you are male.  In folds of skin around the vagina, if you are male. There are three types of inguinal hernias:  Hernias that can be pushed back into the abdomen (are reducible). This type rarely causes pain.  Hernias that are not reducible (are incarcerated).  Hernias that are not reducible and lose their blood supply (are strangulated). This type of hernia requires emergency surgery. What are the causes? This condition is caused by having a weak spot in the muscles or tissues in your groin. This develops over time. The hernia may poke through the weak spot when you suddenly strain your lower abdominal muscles, such as when you:  Lift a heavy object.  Strain to have a bowel movement. Constipation can lead to straining.  Cough. What increases the risk? This condition is more likely to develop in:  Males.  Pregnant females.  People who: ? Are overweight. ? Work in jobs that require long periods of standing or heavy lifting. ? Have had an inguinal hernia before. ? Smoke or have lung disease. These factors can lead to long-term (chronic) coughing. What are the signs or symptoms? Symptoms may depend on the size of the hernia. Often, a small inguinal hernia has no symptoms. Symptoms of a larger hernia may include:  A bulge in the groin area. This is easier to see when standing. It might not be visible when lying down.  Pain or burning in the groin. This may get worse when lifting, straining, or coughing.  A dull ache or a feeling of pressure in the groin.  An unusual bulge in the scrotum, in males. Symptoms of a strangulated inguinal hernia may include:  A bulge in your groin that is very painful and tender to the touch.  A bulge that  turns red or purple.  Fever, nausea, and vomiting.  Inability to have a bowel movement or to pass gas. How is this diagnosed? This condition is diagnosed based on your symptoms, your medical history, and a physical exam. Your health care provider may feel your groin area and ask you to cough. How is this treated? Treatment depends on the size of your hernia and whether you have symptoms. If you do not have symptoms, your health care provider may have you watch your hernia carefully and have you come in for follow-up visits. If your hernia is large or if you have symptoms, you may need surgery to repair the hernia. Follow these instructions at home: Lifestyle  Avoid lifting heavy objects.  Avoid standing for long periods of time.  Do not use any products that contain nicotine or tobacco. These products include cigarettes, chewing tobacco, and vaping devices, such as e-cigarettes. If you need help quitting, ask your health care provider.  Maintain a healthy weight. Preventing constipation You may need to take these actions to prevent or treat constipation:  Drink enough fluid to keep your urine pale yellow.  Take over-the-counter or prescription medicines.  Eat foods that are high in fiber, such as beans, whole grains, and fresh fruits and vegetables.  Limit foods that are high in fat and processed sugars, such as fried or sweet foods. General instructions  You may try to push the hernia back in place by very gently  pressing on it while lying down. Do not try to force the bulge back in if it will not push in easily.  Watch your hernia for any changes in shape, size, or color. Get help right away if you notice any changes.  Take over-the-counter and prescription medicines only as told by your health care provider.  Keep all follow-up visits. This is important. Contact a health care provider if:  You have a fever or chills.  You develop new symptoms.  Your symptoms get  worse. Get help right away if:  You have pain in your groin that suddenly gets worse.  You have a bulge in your groin that: ? Suddenly gets bigger and does not get smaller. ? Becomes red or purple or painful to the touch.  You are a man and you have a sudden pain in your scrotum, or the size of your scrotum suddenly changes.  You cannot push the hernia back in place by very gently pressing on it when you are lying down.  You have nausea or vomiting that does not go away.  You have a fast heartbeat.  You cannot have a bowel movement or pass gas. These symptoms may represent a serious problem that is an emergency. Do not wait to see if the symptoms will go away. Get medical help right away. Call your local emergency services (911 in the [place] Summary  An inguinal hernia develops when fat or the intestines push through a weak spot in a muscle where your leg meets your lower abdomen (groin).  This condition is caused by having a weak spot in muscles or tissues in your groin.  Symptoms may depend on the size of the hernia, and they may include pain or swelling in your groin. A small inguinal hernia often has no symptoms.  Treatment may not be needed if you do not have symptoms. If you have symptoms or a large hernia, you may need surgery to repair the hernia.  Avoid lifting heavy objects. Also, avoid standing for long periods of time. This information is not intended to replace advice given to you by your health care provider. Make sure you discuss any questions you have with your health care provider. Document Revised: 02/02/2020 Document Reviewed: 02/02/2020 Elsevier Patient Education  2021 [employer], Adult Open hernia repair is a surgical procedure to fix a hernia. A hernia occurs when an internal organ or tissue pushes through a weak spot in the muscles along the wall of the abdomen. Hernias commonly occur in the groin and around the belly button. Most  hernias tend to get worse over time. Often, surgery is done to prevent the hernia from becoming bigger, uncomfortable, or an emergency. Emergency surgery may be needed if contents of the abdomen get stuck in the opening (incarcerated hernia) or if the blood supply gets cut off (strangulated hernia). In an open repair, an incision is made in the abdomen to perform the surgery. Tell a health care provider about:  Any allergies you have.  All medicines you are taking, including vitamins, herbs, eye drops, creams, and over-the-counter medicines.  Any problems you or family members have had with anesthetic medicines.  Any blood or bone disorders you have.  Any surgeries you have had.  Any medical conditions you have, including any recent cold or flu (influenza)symptoms.  Whether you are pregnant or may be pregnant. What are the risks? Generally, this is a safe procedure. However, problems may occur, including:  Long-lasting (chronic) pain.  Bleeding.  Infection.  Damage to the testicles. This can cause shrinking or swelling.  Damage to nearby structures or organs, including the bladder, blood vessels, intestines, or nerves near the hernia.  Blood clots.  Trouble passing urine.  Return of the hernia. Medicines Ask your health care provider about:  Changing or stopping your regular medicines. This is especially important if you are taking diabetes medicines or blood thinners.  Taking medicines such as aspirin and ibuprofen. These medicines can thin your blood. Do not take these medicines unless your health care provider tells you to take them.  Taking over-the-counter medicines, vitamins, herbs, and supplements. Surgery safety Ask your health care provider:  How your surgery site will be marked.  What steps will be taken to help prevent infection. These steps may include: ? Removing hair at the surgery site. ? Washing skin with a germ-killing soap. ? Receiving antibiotic  medicine. General instructions  You may have an exam or testing, such as blood tests or imaging studies.  Do not use any products that contain nicotine or tobacco for at least 4 weeks before the procedure. These products include cigarettes, chewing tobacco, and vaping devices, such as e-cigarettes. If you need help quitting, ask your health care provider.  Let your health care provider know if you develop a cold or any infection before your surgery. If you get an infection before surgery, you may receive antibiotics to treat it.  Plan to have a responsible adult take you home from the hospital or clinic.  If you will be going home right after the procedure, plan to have a responsible adult care for you for the time you are told. This is important. What happens during the procedure?  An IV will be inserted into one of your veins.  You will be given one or more of the following: ? A medicine to help you relax (sedative). ? A medicine to numb the area (local anesthetic). ? A medicine to make you fall asleep (general anesthetic).  Your surgeon will make an incision over the hernia.  The tissues of the hernia will be moved back into place.  The edges of the hernia may be stitched (sutured) together.  The opening in the abdominal muscles will be closed with stitches (sutures). Or, your surgeon will place a mesh patch made of artificial (synthetic) material over the opening.  The incision will be closed with sutures, skin glue, or adhesive strips.  A bandage (dressing) may be placed over the incision. The procedure may vary among health care providers and hospitals.   What happens after the procedure?  Your blood pressure, heart rate, breathing rate, and blood oxygen level will be monitored until you leave the hospital or clinic.  You may be given medicine for pain.  If you were given a sedative during the procedure, it can affect you for several hours. Do not drive or operate  machinery until your health care provider says that it is safe. Summary  Open hernia repair is a surgical procedure to fix a hernia. Hernias commonly occur in the groin and around the belly button.  Emergency surgery may be needed if contents of the abdomen get stuck in the opening (incarcerated hernia) or if the blood supply gets cut off (strangulated hernia).  In this procedure, an incision is made in the abdomen to perform the surgery.  After the procedure, you may be given medicine for pain. This information is not intended to replace advice  given to you by your health care provider. Make sure you discuss any questions you have with your health care provider. Document Revised: 01/18/2020 Document Reviewed: 01/18/2020 Elsevier Patient Education  2021 [employer].

## 2020-06-28 ENCOUNTER — Ambulatory Visit (INDEPENDENT_AMBULATORY_CARE_PROVIDER_SITE_OTHER): Payer: Medicare Other | Admitting: General Surgery

## 2020-06-28 ENCOUNTER — Encounter: Payer: Self-pay | Admitting: General Surgery

## 2020-06-28 VITALS — BP 122/67 | HR 74 | Temp 97.6°F | Resp 16 | Ht 66.0 in | Wt 168.0 lb

## 2020-06-28 DIAGNOSIS — K4030 Unilateral inguinal hernia, with obstruction, without gangrene, not specified as recurrent: Secondary | ICD-10-CM | POA: Diagnosis not present

## 2020-06-28 NOTE — Patient Instructions (Signed)
Open Hernia Repair, Adult Open hernia repair is a surgical procedure to fix a hernia. A hernia occurs when an internal organ or tissue pushes through a weak spot in the muscles along the wall of the abdomen. Hernias commonly occur in the groin and around the belly button. Most hernias tend to get worse over time. Often, surgery is done to prevent the hernia from becoming bigger, uncomfortable, or an emergency. Emergency surgery may be needed if contents of the abdomen get stuck in the opening (incarcerated hernia) or if the blood supply gets cut off (strangulated hernia). In an open repair, an incision is made in the abdomen to perform the surgery. Tell a health care provider about:  Any allergies you have.  All medicines you are taking, including vitamins, herbs, eye drops, creams, and over-the-counter medicines.  Any problems you or family members have had with anesthetic medicines.  Any blood or bone disorders you have.  Any surgeries you have had.  Any medical conditions you have, including any recent cold or flu (influenza)symptoms.  Whether you are pregnant or may be pregnant. What are the risks? Generally, this is a safe procedure. However, problems may occur, including:  Long-lasting (chronic) pain.  Bleeding.  Infection.  Damage to the testicles. This can cause shrinking or swelling.  Damage to nearby structures or organs, including the bladder, blood vessels, intestines, or nerves near the hernia.  Blood clots.  Trouble passing urine.  Return of the hernia. What happens before the procedure? Staying hydrated Follow instructions from your health care provider about hydration, which may include:  Up to 2 hours before the procedure - you may continue to drink clear liquids, such as water, clear fruit juice, black coffee, and plain tea.   Eating and drinking restrictions Follow instructions from your health care provider about eating and drinking, which may  include:  8 hours before the procedure - stop eating heavy meals or foods, such as meat, fried foods, or fatty foods.  6 hours before the procedure - stop eating light meals or foods, such as toast or cereal.  6 hours before the procedure - stop drinking milk or drinks that contain milk.  2 hours before the procedure - stop drinking clear liquids. Medicines Ask your health care provider about:  Changing or stopping your regular medicines. This is especially important if you are taking diabetes medicines or blood thinners.  Taking medicines such as aspirin and ibuprofen. These medicines can thin your blood. Do not take these medicines unless your health care provider tells you to take them.  Taking over-the-counter medicines, vitamins, herbs, and supplements. Surgery safety Ask your health care provider:  How your surgery site will be marked.  What steps will be taken to help prevent infection. These steps may include: ? Removing hair at the surgery site. ? Washing skin with a germ-killing soap. ? Receiving antibiotic medicine. General instructions  You may have an exam or testing, such as blood tests or imaging studies.  Do not use any products that contain nicotine or tobacco for at least 4 weeks before the procedure. These products include cigarettes, chewing tobacco, and vaping devices, such as e-cigarettes. If you need help quitting, ask your health care provider.  Let your health care provider know if you develop a cold or any infection before your surgery. If you get an infection before surgery, you may receive antibiotics to treat it.  Plan to have a responsible adult take you home from the hospital or clinic.  If you will be going home right after the procedure, plan to have a responsible adult care for you for the time you are told. This is important. What happens during the procedure?  An IV will be inserted into one of your veins.  You will be given one or more of  the following: ? A medicine to help you relax (sedative). ? A medicine to numb the area (local anesthetic). ? A medicine to make you fall asleep (general anesthetic).  Your surgeon will make an incision over the hernia.  The tissues of the hernia will be moved back into place.  The edges of the hernia may be stitched (sutured) together.  The opening in the abdominal muscles will be closed with stitches (sutures). Or, your surgeon will place a mesh patch made of artificial (synthetic) material over the opening.  The incision will be closed with sutures, skin glue, or adhesive strips.  A bandage (dressing) may be placed over the incision. The procedure may vary among health care providers and hospitals.   What happens after the procedure?  Your blood pressure, heart rate, breathing rate, and blood oxygen level will be monitored until you leave the hospital or clinic.  You may be given medicine for pain.  If you were given a sedative during the procedure, it can affect you for several hours. Do not drive or operate machinery until your health care provider says that it is safe. Summary  Open hernia repair is a surgical procedure to fix a hernia. Hernias commonly occur in the groin and around the belly button.  Emergency surgery may be needed if contents of the abdomen get stuck in the opening (incarcerated hernia) or if the blood supply gets cut off (strangulated hernia).  In this procedure, an incision is made in the abdomen to perform the surgery.  After the procedure, you may be given medicine for pain. This information is not intended to replace advice given to you by your health care provider. Make sure you discuss any questions you have with your health care provider. Document Revised: 01/18/2020 Document Reviewed: 01/18/2020 Elsevier Patient Education  2021 [employer].

## 2020-06-28 NOTE — Progress Notes (Signed)
[facility] Note   HPI:  80 y.o. Male presents to clinic for follow-up evaluation of his left inguinal hernia. He says it has been back out since yesterday. He is sore. He says that he has been able to eat and drink. He says that he pushed it back in.   Review of Systems:  Soreness in the left groin Bulge left groin All other review of systems: otherwise negative   Vital Signs:  BP 122/67   Pulse 74   Temp 97.6 F (36.4 C) (Other (Comment))   Resp 16   Ht 5\' 6"  (1.676 m)   Wt 168 lb (76.2 kg)   SpO2 95%   BMI 27.12 kg/m    Physical Exam:  Physical Exam Vitals reviewed.  Cardiovascular:     Rate and Rhythm: Normal rate.  Pulmonary:     Effort: Pulmonary effort is normal.  Abdominal:     General: There is no distension.     Palpations: Abdomen is soft.     Tenderness: There is no abdominal tenderness.     Hernia: A hernia is present. Hernia is present in the left inguinal area.     Comments: Reduced, tender  Neurological:     Mental Status: He is alert.      Assessment:  80 y.o. yo Male with left inguinal hernia that is sticking out again but I was able to reduce. He was incarcerated yesterday. He needs to get this repaired As soon as possible. He held his coumadin starting yesterday. He will be off for 5 days on Friday. We discussed the option of Friday surgery versus Monday. We discussed doing INR on Friday to ensure down to appropriate level on that day.   Plan:  - Discussed the risk and benefits including, bleeding, infection, use of mesh, risk of recurrence, risk of nerve damage causing numbness or changes in sensation, risk of damage to the cord structures. The patient understands the risk and benefits of repair with mesh, and has decided to proceed.  We also discussed open versus laparoscopic surgery and the use of mesh. We discussed that I do open repairs with mesh, and that this is considered equivalent to laparoscopic surgery. We discussed  reasons for opting for laparoscopic surgery including if a bilateral repair is needed or if a patient has a recurrence after an open repair.   Discussed COVID testing and he had COVID 06/16/2020. He says he was not symptomatic with any fever, or cough. He had worked outside for about 12 hours and was tired the following day and had some congestion so he went to urgent care. He was diagnosed with COVID after testing but he says he felt better the following day after sleeping for like 15 hours.   We discussed this with the OR and given the recent COVID positive, he will not need testing or precautions. He also was not symptomatic and has a hernia that needs to be repaired urgently so we will proceed. Discussed that if the hernia was asymptomatic we would hold off but I do not want it to get incarcerated and strangulated between now and then.   INR On Friday. If not less than 1.5 will have to hold for surgery until Monday.   All of the above recommendations were discussed with the patient and patient's family, and all of patient's and family's questions were answered to their expressed satisfaction.  Curlene Labrum, MD [facility] [address] [place], [place] [address]  [phone] (office)

## 2020-06-28 NOTE — Patient Instructions (Signed)
[name]  06/28/2020     @PREFPERIOPPHARMACY @   Your procedure is scheduled on  07/01/2020.  Report to [name] at  [place].  Call this number if you have problems the morning of surgery:  [phone]   Remember:  Do not eat or drink after midnight.                        Take these medicines the morning of surgery with A SIP OF WATER  Metoprolol. Use your nebulizer and your inhaler before you come. Your last dose of coumadin should have been 06/27/2020.    Do not wear jewelry, make-up or nail polish.  Do not wear lotions, powders, or perfumes, or deodorant. Please brush your teeth.  Do not shave 48 hours prior to surgery.  Men may shave face and neck.  Do not bring valuables to the hospital.  [facility] is not responsible for any belongings or valuables.  Contacts, dentures or bridgework may not be worn into surgery.  Leave your suitcase in the car.  After surgery it may be brought to your room.  For patients admitted to the hospital, discharge time will be determined by your treatment team.  Patients discharged the day of surgery will not be allowed to drive home.   Name and phone number of your driver:   family Special instructions:  Bathe with 1/2 bottle of CHG the night before and the morning of your procedure. DO NOT use CHG on your face, hair or genitals. Please dry off with a clean towel, put on clean clothes and have clean sheets on your bed.   Please read over the following fact sheets that you were given. Anesthesia Post-op Instructions and Care and Recovery After Surgery       Open Hernia Repair, Adult, Care After What can I expect after the procedure? After the procedure, it is common to have:  Mild discomfort.  Slight bruising.  Mild swelling.  Pain in the belly (abdomen).  A small amount of blood from the cut from surgery (incision). Follow these instructions at home: Your doctor may give you more specific instructions. If you  have problems, call your doctor. Medicines  Take over-the-counter and prescription medicines only as told by your doctor.  If told, take steps to prevent problems with pooping (constipation). You may need to: ? Drink enough fluid to keep your pee (urine) pale yellow. ? Take medicines. You will be told what medicines to take. ? Eat foods that are high in fiber. These include beans, whole grains, and fresh fruits and vegetables. ? Limit foods that are high in fat and sugar. These include fried or sweet foods.  Ask your doctor if you should avoid driving or using machines while you are taking your medicine. Incision care  Follow instructions from your doctor about how to take care of your incision. Make sure you: ? Wash your hands with soap and water for at least 20 seconds before and after you change your bandage (dressing). If you cannot use soap and water, use hand sanitizer. ? Change your bandage. ? Leave stitches or skin glue in place for at least 2 weeks. ? Leave tape strips alone unless you are told to take them off. You may trim the edges of the tape strips if they curl up.  Check your incision every day for signs of infection. Check for: ? More redness, swelling, or  pain. ? More fluid or blood. ? Warmth. ? Pus or a bad smell.  Wear loose, soft clothing while your incision heals.   Activity  Rest as told by your doctor.  Do not lift anything that is heavier than 10 lb (4.5 kg), or the limit that you are told.  Do not play contact sports until your doctor says that this is safe.  If you were given a sedative during your procedure, do not drive or use machines until your doctor says that it is safe. A sedative is a medicine that helps you relax.  Return to your normal activities when your doctor says that it is safe.   General instructions  Do not take baths, swim, or use a hot tub. Ask your doctor about taking showers or sponge baths.  Hold a pillow over your belly when  you cough or sneeze. This helps with pain.  Do not smoke or use any products that contain nicotine or tobacco. If you need help quitting, ask your doctor.  Keep all follow-up visits. Contact a doctor if:  You have any of these signs of infection in or around your incision: ? More redness, swelling, or pain. ? More fluid or blood. ? Warmth. ? Pus. ? A bad smell.  You have a fever or chills.  You have blood in your poop (stool).  You have not pooped (had a bowel movement) in 2-3 days.  Medicine does not help your pain. Get help right away if:  You have chest pain, or you are short of breath.  You feel faint or light-headed.  You have very bad pain.  You vomit and your pain is worse.  You have pain, swelling, or redness in a leg. These symptoms may be an emergency. Get help right away. Call your local emergency services (911 in the [place]  Do not wait to see if the symptoms will go away.  Do not drive yourself to the hospital. Summary  After this procedure, it is common to have mild discomfort, slight bruising, and mild swelling.  Follow instructions from your doctor about how to take care of your cut from surgery (incision). Check every day for signs of infection.  Do not lift heavy objects or play contact sports until your doctor says it is safe.  Return to your normal activities as told by your doctor. This information is not intended to replace advice given to you by your health care provider. Make sure you discuss any questions you have with your health care provider. Document Revised: 01/18/2020 Document Reviewed: 01/18/2020 Elsevier Patient Education  2021 [employer] Anesthesia, Adult, Care After This sheet gives you information about how to care for yourself after your procedure. Your health care provider may also give you more specific instructions. If you have problems or questions, contact your health care provider. What can I expect after the  procedure? After the procedure, the following side effects are common:  Pain or discomfort at the IV site.  Nausea.  Vomiting.  Sore throat.  Trouble concentrating.  Feeling cold or chills.  Feeling weak or tired.  Sleepiness and fatigue.  Soreness and body aches. These side effects can affect parts of the body that were not involved in surgery. Follow these instructions at home: For the time period you were told by your health care provider:  Rest.  Do not participate in activities where you could fall or become injured.  Do not drive or use machinery.  Do not  drink alcohol.  Do not take sleeping pills or medicines that cause drowsiness.  Do not make important decisions or sign legal documents.  Do not take care of children on your own.   Eating and drinking  Follow any instructions from your health care provider about eating or drinking restrictions.  When you feel hungry, start by eating small amounts of foods that are soft and easy to digest (bland), such as toast. Gradually return to your regular diet.  Drink enough fluid to keep your urine pale yellow.  If you vomit, rehydrate by drinking water, juice, or clear broth. General instructions  If you have sleep apnea, surgery and certain medicines can increase your risk for breathing problems. Follow instructions from your health care provider about wearing your sleep device: ? Anytime you are sleeping, including during daytime naps. ? While taking prescription pain medicines, sleeping medicines, or medicines that make you drowsy.  Have a responsible adult stay with you for the time you are told. It is important to have someone help care for you until you are awake and alert.  Return to your normal activities as told by your health care provider. Ask your health care provider what activities are safe for you.  Take over-the-counter and prescription medicines only as told by your health care provider.  If you  smoke, do not smoke without supervision.  Keep all follow-up visits as told by your health care provider. This is important. Contact a health care provider if:  You have nausea or vomiting that does not get better with medicine.  You cannot eat or drink without vomiting.  You have pain that does not get better with medicine.  You are unable to pass urine.  You develop a skin rash.  You have a fever.  You have redness around your IV site that gets worse. Get help right away if:  You have difficulty breathing.  You have chest pain.  You have blood in your urine or stool, or you vomit blood. Summary  After the procedure, it is common to have a sore throat or nausea. It is also common to feel tired.  Have a responsible adult stay with you for the time you are told. It is important to have someone help care for you until you are awake and alert.  When you feel hungry, start by eating small amounts of foods that are soft and easy to digest (bland), such as toast. Gradually return to your regular diet.  Drink enough fluid to keep your urine pale yellow.  Return to your normal activities as told by your health care provider. Ask your health care provider what activities are safe for you. This information is not intended to replace advice given to you by your health care provider. Make sure you discuss any questions you have with your health care provider. Document Revised: 02/18/2020 Document Reviewed: 09/17/2019 Elsevier Patient Education  2021 [employer].

## 2020-06-28 NOTE — H&P (Signed)
[facility] Consult  Reason for Consult: Incarcerated left inguinal hernia  Referring Physician:  ED/ PCP      Chief Complaint    Inguinal Hernia      HPI: [name] is a 80 y.o. male with an incarcerated inguinal hernia on the left. He was seen at his PCP office and they could not get the area to reduce. He says he never knew he had a hernia but reports some swelling and pain in the area since Friday. He has been having regular BMs and no nausea or vomiting. He was able to ambulate in the ED to the room but says the area is tender.  He has a prior CT from June 2021 where I see a hernia that contains colon on the left but this was not called on his read.       Past Medical History:  Diagnosis Date  . Anxiety disorder   . Benign prostatic hypertrophy   . Bilateral cataracts   . CAD (coronary artery disease)    Catheterization 2009, mild CAD  . Carotid artery disease ([place])    Doppler, September, 2011, less than 50% bilateral  . COPD (chronic obstructive pulmonary disease) ([place])   . CVA (cerebral infarction)    Hospitalization.  MRI...    Left anterior cerebral artery territory, September, 2011... Coumadin started  . DYSLIPIDEMIA 11/01/2009   Qualifier: Diagnosis of  By: Ron Parker, MD, Leonidas Romberg Dorinda Hill   . Ejection fraction    EF 60%  . Ejection fraction    EF 55-60%, echo, September, 2011  . Gastroesophageal reflux disease   . Hyperlipidemia   . Persistent atrial fibrillation ([place])       . Ptosis of eyelid, left    Surgery in the past  . Sweating abnormality    Episodes of sweating appeared to be related to a delayed reaction to niacin  . Vitamin D insufficiency          Past Surgical History:  Procedure Laterality Date  . APPENDECTOMY    . CATARACT EXTRACTION, BILATERAL    . KNEE SURGERY Right 2019   torn ligament?   . TRANSURETHRAL RESECTION OF PROSTATE N/A 08/19/2018   Procedure: TRANSURETHRAL RESECTION  OF THE PROSTATE (TURP) OF PROSTATE ABCESS;  Surgeon: Alexis Frock, MD;  Location: [facility];  Service: Urology;  Laterality: N/A;         Family History  Problem Relation Age of Onset  . Heart attack Mother   . Other Other        Insignificant for premature CAD  . Heart disease Sister   . Hyperlipidemia Sister   . Kidney disease Son   . Heart attack Paternal Grandfather     Social History        Tobacco Use  . Smoking status: Former Smoker    Packs/day: 3.00    Years: 50.00    Pack years: 150.00    Types: Cigarettes    Quit date: 03/18/1990    Years since quitting: 30.2  . Smokeless tobacco: Former Systems developer    Types: [place] date: 06/18/2000  Vaping Use  . Vaping Use: Never used  Substance Use Topics  . Alcohol use: No    Alcohol/week: 0.0 standard drinks  . Drug use: No    Medications: I have reviewed the patient's current medications. No current facility-administered medications for this encounter.          Current Outpatient Medications  Medication Sig  Dispense Refill Last Dose  . albuterol (PROVENTIL) (2.5 MG/3ML) 0.083% nebulizer solution Take 3 mLs (2.5 mg total) by nebulization every 6 (six) hours as needed for wheezing or shortness of breath. 72 mL 0   . albuterol (VENTOLIN HFA) 108 (90 Base) MCG/ACT inhaler Inhale 2 puffs into the lungs every 6 (six) hours as needed for wheezing or shortness of breath. 18 g 11   . Budeson-Glycopyrrol-Formoterol (BREZTRI AEROSPHERE) 160-9-4.8 MCG/ACT AERO Inhale 2 puffs into the lungs in the morning and at bedtime. 42.8 g 12   . diazepam (VALIUM) 2 MG tablet Take 1 tablet (2 mg total) by mouth every 8 (eight) hours as needed (dizziness). 30 tablet 0   . ezetimibe (ZETIA) 10 MG tablet Take 1 tablet (10 mg total) by mouth daily. 90 tablet 3   . meclizine (ANTIVERT) 25 MG tablet Take 1 tablet (25 mg total) by mouth 3 (three) times daily as needed for dizziness. 30 tablet 2   . methocarbamol  (ROBAXIN) 500 MG tablet Take 1 tablet (500 mg total) by mouth every 8 (eight) hours as needed for muscle spasms. 30 tablet 0   . metoprolol tartrate (LOPRESSOR) 25 MG tablet TAKE 1 AND 1/2 TABLETS TWICE DAILY ( NEW DOSE ) 270 tablet 3   . omeprazole (PRILOSEC) 20 MG capsule Take 1 capsule (20 mg total) by mouth daily. 90 capsule 1   . rosuvastatin (CRESTOR) 10 MG tablet Take 1 tablet up to three times per week as tolerated 36 tablet 3   . traZODone (DESYREL) 50 MG tablet Take 1 tablet (50 mg total) by mouth at bedtime. 90 tablet 2   . Vitamin D, Ergocalciferol, (DRISDOL) 1.25 MG (50000 UNIT) CAPS capsule Take 1 capsule (50,000 Units total) by mouth every 7 (seven) days. 12 capsule 2   . warfarin (COUMADIN) 5 MG tablet TAKE 1 TABLET DAILY EXCEPT TAKE 1/2 TABLET ON TUESDAYS AND THURSDAYS OR AS DIRECTD 90 tablet 3         Allergies  Allergen Reactions  . Asa [Aspirin]     GI upset  . Niacin     REACTION: hot flashes  . Pravastatin     myalgias  . Rosuvastatin     myalgias  . Simvastatin     Myalgias      ROS:  A comprehensive review of systems was negative except for: Gastrointestinal: positive for left groin bulge and tenderness  Blood pressure (!) 147/71, pulse 75, temperature 98.7 F (37.1 C), temperature source Oral, resp. rate 19, height 5\' 6"  (1.676 m), weight 75.8 kg, SpO2 98 %. Physical Exam Vitals reviewed.  Constitutional:      Appearance: He is normal weight.  HENT:     Head: Normocephalic.     Nose: Nose normal.     Mouth/Throat:     Mouth: Mucous membranes are moist.  Eyes:     Extraocular Movements: Extraocular movements intact.  Cardiovascular:     Rate and Rhythm: Normal rate.  Pulmonary:     Effort: Pulmonary effort is normal.  Abdominal:     General: There is no distension.     Palpations: Abdomen is soft.     Tenderness: There is abdominal tenderness.     Hernia: A hernia is present. Hernia is present in the left inguinal  area.     Comments: Incarcerated hernia, reduced with gentle extended pressure, tenderness resolved   Musculoskeletal:        General: No swelling. Normal range of motion.  Cervical back: Normal range of motion.  Skin:    General: Skin is warm.  Neurological:     General: No focal deficit present.     Mental Status: He is alert and oriented to person, place, and time.  Psychiatric:        Mood and Affect: Mood normal.        Behavior: Behavior normal.        Thought Content: Thought content normal.        Judgment: Judgment normal.     Results: CT a/p 11/2019- colon in left inguinal hernia   Assessment & Plan:  [name] is a 80 y.o. male with an incarcerated inguinal hernia that was able to reduce with pressure. He did well. He has not had any obstructive symptoms. The are soft on exam. Warned about incarceration and strangulation. Warned for reasons to return to ED like vomiting, increased pain, hard bulge returning. He took his coumadin last night. No chest pain or SOB.  Will see in office 10:30AM tomorrow Will plan for surgery next week Hold coumadin now   Discussed the risk and benefits including, bleeding, infection, use of mesh, risk of recurrence, risk of nerve damage causing numbness or changes in sensation, risk of damage to the cord structures. The patient understands the risk and benefits of repair with mesh, and has decided to proceed.  We also discussed open versus laparoscopic surgery and the use of mesh. We discussed that I do open repairs with mesh, and that this is considered equivalent to laparoscopic surgery. We discussed reasons for opting for laparoscopic surgery including if a bilateral repair is needed or if a patient has a recurrence after an open repair.   Discussed COVID testing and he had COVID 06/16/2020. He says he was not symptomatic with any fever, or cough. He had worked outside for about 12 hours and was tired the following day and had  some congestion so he went to urgent care. He was diagnosed with COVID after testing but he says he felt better the following day after sleeping for like 15 hours.   We discussed this with the OR and given the recent COVID positive, he will not need testing or precautions. He also was not symptomatic and has a hernia that needs to be repaired urgently so we will proceed. Discussed that if the hernia was asymptomatic we would hold off but I do not want it to get incarcerated and strangulated between now and then.   INR On Friday. If not less than 1.5 will have to hold for surgery until Monday.   All of the above recommendations were discussed with the patient and patient's family, and all of patient's and family's questions were answered to their expressed satisfaction.  Curlene Labrum, MD [facility] [address] [place], [place] [id number] [phone] (office)

## 2020-06-29 ENCOUNTER — Encounter (HOSPITAL_COMMUNITY): Payer: Self-pay

## 2020-06-29 ENCOUNTER — Encounter (HOSPITAL_COMMUNITY)
Admission: RE | Admit: 2020-06-29 | Discharge: 2020-06-29 | Disposition: A | Discharge status: Home / Self Care | Payer: Medicare Other | Source: Ambulatory Visit | Attending: General Surgery | Admitting: General Surgery

## 2020-06-29 ENCOUNTER — Other Ambulatory Visit: Payer: Self-pay

## 2020-06-29 DIAGNOSIS — Z01812 Encounter for preprocedural laboratory examination: Secondary | ICD-10-CM | POA: Diagnosis not present

## 2020-06-29 LAB — CBC WITH DIFFERENTIAL/PLATELET
Abs Immature Granulocytes: 0.04 10*3/uL (ref 0.00–0.07)
Basophils Absolute: 0 10*3/uL (ref 0.0–0.1)
Basophils Relative: 0 %
Eosinophils Absolute: 0 10*3/uL (ref 0.0–0.5)
Eosinophils Relative: 0 %
HCT: 48.7 % (ref 39.0–52.0)
Hemoglobin: 15.1 g/dL (ref 13.0–17.0)
Immature Granulocytes: 0 %
Lymphocytes Relative: 17 %
Lymphs Abs: 1.8 10*3/uL (ref 0.7–4.0)
MCH: 28.9 pg (ref 26.0–34.0)
MCHC: 31 g/dL (ref 30.0–36.0)
MCV: 93.1 fL (ref 80.0–100.0)
Monocytes Absolute: 0.8 10*3/uL (ref 0.1–1.0)
Monocytes Relative: 7 %
Neutro Abs: 8.3 10*3/uL — ABNORMAL HIGH (ref 1.7–7.7)
Neutrophils Relative %: 76 %
Platelets: 275 10*3/uL (ref 150–400)
RBC: 5.23 MIL/uL (ref 4.22–5.81)
RDW: 13.4 % (ref 11.5–15.5)
WBC: 11.1 10*3/uL — ABNORMAL HIGH (ref 4.0–10.5)
nRBC: 0 % (ref 0.0–0.2)

## 2020-06-29 LAB — BASIC METABOLIC PANEL
Anion gap: 8 (ref 5–15)
BUN: 19 mg/dL (ref 8–23)
CO2: 29 mmol/L (ref 22–32)
Calcium: 9 mg/dL (ref 8.9–10.3)
Chloride: 104 mmol/L (ref 98–111)
Creatinine, Ser: 1.13 mg/dL (ref 0.61–1.24)
GFR, Estimated: >60 mL/min (ref >60)
Glucose, Bld: 89 mg/dL (ref 70–99)
Potassium: 5 mmol/L (ref 3.5–5.1)
Sodium: 141 mmol/L (ref 135–145)

## 2020-07-01 ENCOUNTER — Ambulatory Visit (HOSPITAL_COMMUNITY): Payer: Medicare Other | Admitting: Anesthesiology

## 2020-07-01 ENCOUNTER — Encounter (HOSPITAL_COMMUNITY): Payer: Self-pay | Admitting: General Surgery

## 2020-07-01 ENCOUNTER — Encounter (HOSPITAL_COMMUNITY)
Admission: RE | Disposition: A | Discharge status: Home / Self Care | Payer: Self-pay | Source: Home / Self Care | Attending: General Surgery

## 2020-07-01 ENCOUNTER — Ambulatory Visit (HOSPITAL_COMMUNITY)
Admission: RE | Admit: 2020-07-01 | Discharge: 2020-07-01 | Disposition: A | Discharge status: Home / Self Care | Payer: Medicare Other | Attending: General Surgery | Admitting: General Surgery

## 2020-07-01 DIAGNOSIS — Z888 Allergy status to other drugs, medicaments and biological substances status: Secondary | ICD-10-CM | POA: Insufficient documentation

## 2020-07-01 DIAGNOSIS — Z9841 Cataract extraction status, right eye: Secondary | ICD-10-CM | POA: Diagnosis not present

## 2020-07-01 DIAGNOSIS — Z8673 Personal history of transient ischemic attack (TIA), and cerebral infarction without residual deficits: Secondary | ICD-10-CM | POA: Diagnosis not present

## 2020-07-01 DIAGNOSIS — Z7901 Long term (current) use of anticoagulants: Secondary | ICD-10-CM | POA: Insufficient documentation

## 2020-07-01 DIAGNOSIS — Z8249 Family history of ischemic heart disease and other diseases of the circulatory system: Secondary | ICD-10-CM | POA: Diagnosis not present

## 2020-07-01 DIAGNOSIS — Z8719 Personal history of other diseases of the digestive system: Secondary | ICD-10-CM | POA: Diagnosis present

## 2020-07-01 DIAGNOSIS — J449 Chronic obstructive pulmonary disease, unspecified: Secondary | ICD-10-CM | POA: Diagnosis not present

## 2020-07-01 DIAGNOSIS — Z841 Family history of disorders of kidney and ureter: Secondary | ICD-10-CM | POA: Insufficient documentation

## 2020-07-01 DIAGNOSIS — Z8349 Family history of other endocrine, nutritional and metabolic diseases: Secondary | ICD-10-CM | POA: Insufficient documentation

## 2020-07-01 DIAGNOSIS — Z886 Allergy status to analgesic agent status: Secondary | ICD-10-CM | POA: Insufficient documentation

## 2020-07-01 DIAGNOSIS — Z8616 Personal history of COVID-19: Secondary | ICD-10-CM | POA: Diagnosis not present

## 2020-07-01 DIAGNOSIS — I4891 Unspecified atrial fibrillation: Secondary | ICD-10-CM | POA: Diagnosis not present

## 2020-07-01 DIAGNOSIS — Z87891 Personal history of nicotine dependence: Secondary | ICD-10-CM | POA: Insufficient documentation

## 2020-07-01 DIAGNOSIS — K4030 Unilateral inguinal hernia, with obstruction, without gangrene, not specified as recurrent: Secondary | ICD-10-CM | POA: Diagnosis not present

## 2020-07-01 DIAGNOSIS — Z9842 Cataract extraction status, left eye: Secondary | ICD-10-CM | POA: Insufficient documentation

## 2020-07-01 DIAGNOSIS — Z79899 Other long term (current) drug therapy: Secondary | ICD-10-CM | POA: Diagnosis not present

## 2020-07-01 HISTORY — PX: INGUINAL HERNIA REPAIR: SHX194

## 2020-07-01 LAB — PROTIME-INR
INR: 1.4 — ABNORMAL HIGH (ref 0.8–1.2)
Prothrombin Time: 16.1 seconds — ABNORMAL HIGH (ref 11.4–15.2)

## 2020-07-01 SURGERY — REPAIR, HERNIA, INGUINAL, ADULT
Anesthesia: General | Site: Inguinal | Laterality: Left

## 2020-07-01 MED ORDER — FENTANYL CITRATE (PF) 100 MCG/2ML IJ SOLN
INTRAMUSCULAR | Status: DC | PRN
  Administered 2020-07-01 (×5): 50 ug via INTRAVENOUS

## 2020-07-01 MED ORDER — BUPIVACAINE LIPOSOME 1.3 % IJ SUSP
INTRAMUSCULAR | Status: DC | PRN
  Administered 2020-07-01: 20 mL

## 2020-07-01 MED ORDER — LIDOCAINE HCL (PF) 2 % IJ SOLN
INTRAMUSCULAR | Status: AC
  Filled 2020-07-01: qty 5

## 2020-07-01 MED ORDER — OXYCODONE HCL 5 MG PO TABS: 5.0000 mg | ORAL_TABLET | ORAL | 0 refills | Status: DC | PRN

## 2020-07-01 MED ORDER — LACTATED RINGERS IV SOLN: INTRAVENOUS | Status: DC | PRN

## 2020-07-01 MED ORDER — SODIUM CHLORIDE 0.9 % IR SOLN
Status: DC | PRN
  Administered 2020-07-01: 1000 mL

## 2020-07-01 MED ORDER — CHLORHEXIDINE GLUCONATE CLOTH 2 % EX PADS: 6.0000 | MEDICATED_PAD | Freq: Once | CUTANEOUS | Status: DC

## 2020-07-01 MED ORDER — PHENYLEPHRINE 40 MCG/ML (10ML) SYRINGE FOR IV PUSH (FOR BLOOD PRESSURE SUPPORT)
PREFILLED_SYRINGE | INTRAVENOUS | Status: AC
  Filled 2020-07-01: qty 10

## 2020-07-01 MED ORDER — ONDANSETRON HCL 4 MG/2ML IJ SOLN
INTRAMUSCULAR | Status: AC
  Filled 2020-07-01: qty 2

## 2020-07-01 MED ORDER — LIDOCAINE HCL (CARDIAC) PF 100 MG/5ML IV SOSY
PREFILLED_SYRINGE | INTRAVENOUS | Status: DC | PRN
  Administered 2020-07-01: 60 mg via INTRATRACHEAL

## 2020-07-01 MED ORDER — ONDANSETRON HCL 4 MG PO TABS: 4.0000 mg | ORAL_TABLET | Freq: Three times a day (TID) | ORAL | 1 refills | Status: DC | PRN

## 2020-07-01 MED ORDER — PROPOFOL 10 MG/ML IV BOLUS
INTRAVENOUS | Status: DC | PRN
  Administered 2020-07-01: 50 mg via INTRAVENOUS

## 2020-07-01 MED ORDER — ONDANSETRON HCL 4 MG/2ML IJ SOLN: 4.0000 mg | Freq: Once | INTRAMUSCULAR | Status: DC | PRN

## 2020-07-01 MED ORDER — ROCURONIUM BROMIDE 10 MG/ML (PF) SYRINGE
PREFILLED_SYRINGE | INTRAVENOUS | Status: DC | PRN
  Administered 2020-07-01: 50 mg via INTRAVENOUS
  Administered 2020-07-01 (×3): 10 mg via INTRAVENOUS

## 2020-07-01 MED ORDER — BUPIVACAINE LIPOSOME 1.3 % IJ SUSP
INTRAMUSCULAR | Status: AC
  Filled 2020-07-01: qty 20

## 2020-07-01 MED ORDER — CEFAZOLIN SODIUM-DEXTROSE 2-4 GM/100ML-% IV SOLN
2.0000 g | INTRAVENOUS | Status: AC
  Administered 2020-07-01: 2 g via INTRAVENOUS
  Filled 2020-07-01: qty 100

## 2020-07-01 MED ORDER — DEXAMETHASONE SODIUM PHOSPHATE 10 MG/ML IJ SOLN
INTRAMUSCULAR | Status: DC | PRN
  Administered 2020-07-01: 5 mg via INTRAVENOUS

## 2020-07-01 MED ORDER — PROPOFOL 10 MG/ML IV BOLUS
INTRAVENOUS | Status: AC
  Filled 2020-07-01: qty 40

## 2020-07-01 MED ORDER — DOCUSATE SODIUM 100 MG PO CAPS: 100.0000 mg | ORAL_CAPSULE | Freq: Two times a day (BID) | ORAL | 0 refills | Status: DC | PRN

## 2020-07-01 MED ORDER — FENTANYL CITRATE (PF) 250 MCG/5ML IJ SOLN
INTRAMUSCULAR | Status: AC
  Filled 2020-07-01: qty 5

## 2020-07-01 MED ORDER — SUGAMMADEX SODIUM 500 MG/5ML IV SOLN
INTRAVENOUS | Status: DC | PRN
  Administered 2020-07-01: 200 mg via INTRAVENOUS

## 2020-07-01 MED ORDER — HYDROMORPHONE HCL 1 MG/ML IJ SOLN
0.2500 mg | INTRAMUSCULAR | Status: DC | PRN
  Administered 2020-07-01 (×2): 0.25 mg via INTRAVENOUS
  Filled 2020-07-01 (×2): qty 0.5

## 2020-07-01 MED ORDER — ONDANSETRON HCL 4 MG/2ML IJ SOLN
INTRAMUSCULAR | Status: DC | PRN
  Administered 2020-07-01: 4 mg via INTRAVENOUS

## 2020-07-01 MED ORDER — CHLORHEXIDINE GLUCONATE 0.12 % MT SOLN
15.0000 mL | Freq: Once | OROMUCOSAL | Status: AC
  Administered 2020-07-01: 15 mL via OROMUCOSAL
  Filled 2020-07-01: qty 15

## 2020-07-01 MED ORDER — LACTATED RINGERS IV SOLN: Freq: Once | INTRAVENOUS | Status: AC

## 2020-07-01 MED ORDER — ORAL CARE MOUTH RINSE: 15.0000 mL | Freq: Once | OROMUCOSAL | Status: AC

## 2020-07-01 MED ORDER — PHENYLEPHRINE HCL (PRESSORS) 10 MG/ML IV SOLN
INTRAVENOUS | Status: DC | PRN
  Administered 2020-07-01: 80 ug via INTRAVENOUS

## 2020-07-01 SURGICAL SUPPLY — 41 items
ADH SKN CLS APL DERMABOND .7 (GAUZE/BANDAGES/DRESSINGS) ×1
CLOTH BEACON ORANGE TIMEOUT ST (SAFETY) ×2 IMPLANT
COVER LIGHT HANDLE STERIS (MISCELLANEOUS) ×4 IMPLANT
COVER WAND RF STERILE (DRAPES) ×2 IMPLANT
DERMABOND ADVANCED (GAUZE/BANDAGES/DRESSINGS) ×1
DERMABOND ADVANCED .7 DNX12 (GAUZE/BANDAGES/DRESSINGS) ×1 IMPLANT
DRAIN PENROSE 0.5X18 (DRAIN) ×2 IMPLANT
ELECT REM PT RETURN 9FT ADLT (ELECTROSURGICAL) ×2
ELECTRODE REM PT RTRN 9FT ADLT (ELECTROSURGICAL) ×1 IMPLANT
GAUZE SPONGE 4X4 12PLY STRL (GAUZE/BANDAGES/DRESSINGS) ×2 IMPLANT
GLOVE BIO SURGEON STRL SZ 6.5 (GLOVE) ×2 IMPLANT
GLOVE BIOGEL PI IND STRL 6.5 (GLOVE) ×1 IMPLANT
GLOVE BIOGEL PI IND STRL 7.0 (GLOVE) ×3 IMPLANT
GLOVE BIOGEL PI INDICATOR 6.5 (GLOVE) ×1
GLOVE BIOGEL PI INDICATOR 7.0 (GLOVE) ×3
GLOVE ECLIPSE 6.5 STRL STRAW (GLOVE) ×4 IMPLANT
GOWN STRL REUS W/TWL LRG LVL3 (GOWN DISPOSABLE) ×6 IMPLANT
INST SET MINOR GENERAL (KITS) ×2 IMPLANT
KIT TURNOVER KIT A (KITS) ×2 IMPLANT
MANIFOLD NEPTUNE II (INSTRUMENTS) ×2 IMPLANT
MESH HERNIA 1.6X1.9 PLUG LRG (Mesh General) ×2 IMPLANT
MESH HERNIA PLUG LRG (Mesh General) ×2 IMPLANT
NEEDLE HYPO 18GX1.5 BLUNT FILL (NEEDLE) ×2 IMPLANT
NEEDLE HYPO 21X1.5 SAFETY (NEEDLE) ×2 IMPLANT
NS IRRIG 1000ML POUR BTL (IV SOLUTION) ×2 IMPLANT
PACK MINOR (CUSTOM PROCEDURE TRAY) ×2 IMPLANT
PAD ARMBOARD 7.5X6 YLW CONV (MISCELLANEOUS) ×2 IMPLANT
PENCIL SMOKE EVACUATOR (MISCELLANEOUS) ×2 IMPLANT
SET BASIN LINEN APH (SET/KITS/TRAYS/PACK) ×2 IMPLANT
SOL PREP PROV IODINE SCRUB 4OZ (MISCELLANEOUS) ×2 IMPLANT
SUT MNCRL AB 4-0 PS2 18 (SUTURE) ×2 IMPLANT
SUT NOVA NAB GS-22 2 2-0 T-19 (SUTURE) ×8 IMPLANT
SUT SILK 3 0 (SUTURE)
SUT SILK 3-0 18XBRD TIE 12 (SUTURE) IMPLANT
SUT VIC AB 2-0 CT1 27 (SUTURE) ×2
SUT VIC AB 2-0 CT1 TAPERPNT 27 (SUTURE) ×1 IMPLANT
SUT VIC AB 3-0 SH 27 (SUTURE) ×2
SUT VIC AB 3-0 SH 27X BRD (SUTURE) ×1 IMPLANT
SUT VICRYL AB 3 0 TIES (SUTURE) IMPLANT
SYR 20ML LL LF (SYRINGE) ×2 IMPLANT
SYR 30ML LL (SYRINGE) ×2 IMPLANT

## 2020-07-01 NOTE — Discharge Instructions (Signed)
Discharge Instructions Hernia:  ** CALL THE OFFICE Tuesday 07/05/2020 to decide when to restart your Coumadin. HOLD COUMADIN FOR NOW.** We want to know how much you bruised.   OFFICE [phone]  Common Complaints: Pain at the incision site is common. This will improve with time. Take your pain medications as described below. Bruising and swelling in the incision and into the scrotum is common.  You can ice the area to help with pain and swelling.  Please elevate your scrotum with a towel to help with swelling.  Some nausea is common and poor appetite. The main goal is to stay hydrated the first few days after surgery.  Numbness at the incision or the thigh is common.  If you start to have burning or tingling pain in your groin, this is from a nerve being pinched. Please call and we can prescribe you a different type of pain medication for nerve pain.   Diet/ Activity: Diet as tolerated. You may not have an appetite, but it is important to stay hydrated. Drink 64 ounces of water a day. Your appetite will return with time.  Shower per your regular routine daily.  Do not take hot showers. Take warm showers that are less than 10 minutes. Rest and listen to your body, but do not remain in bed all day.  Walk everyday for at least 15-20 minutes. Deep cough and move around every 1-2 hours in the first few days after surgery.  Do not pick at the dermabond glue on your incision sites.  This glue film will remain in place for 1-2 weeks and will start to peel off.  Do not place lotions or balms on your incision unless instructed to specifically by Dr. Constance Haw.  Do not lift > 10 lbs, perform excessive bending, pushing, pulling, squatting for 6-8 weeks after surgery.   Pain Expectations and Narcotics: -After surgery you will have pain associated with your incisions and this is normal. The pain is muscular and nerve pain, and will get better with time. -You are encouraged and expected to take non  narcotic medications like tylenol and ibuprofen (when able) to treat pain as multiple modalities can aid with pain treatment. -Narcotics are only used when pain is severe or there is breakthrough pain. -You are not expected to have a pain score of 0 after surgery, as we cannot prevent pain. A pain score of 3-4 that allows you to be functional, move, walk, and tolerate some activity is the goal. The pain will continue to improve over the days after surgery and is dependent on your surgery. -Due to [place] law, we are only able to give a certain amount of pain medication to treat post operative pain, and we only give additional narcotics on a patient by patient basis.  -For most laparoscopic surgery, studies have shown that the majority of patients only need 10-15 narcotic pills, and for open surgeries most patients only need 15-20.   -Having appropriate expectations of pain and knowledge of pain management with non narcotics is important as we do not want anyone to become addicted to narcotic pain medication.  -Using ice packs in the first 48 hours and heating pads after 48 hours, wearing an abdominal binder (when recommended), and using over the counter medications are all ways to help with pain management.   -Simple acts like meditation and mindfulness practices after surgery can also help with pain control and research has proven the benefit of these practices.  Medication: Take Tylenol 1000mg  @  6am, 12noon, 6pm, 44midnight (Do not exceed 4000mg  of tylenol a day). Take Roxicodone for breakthrough pain every 4 hours.  Take Colace for constipation related to narcotic pain medication. If you do not have a bowel movement in 2 days, take Miralax over the counter.  Drink plenty of water to also prevent constipation.   Contact Information: If you have questions or concerns, please call our office, [phone], Monday- Thursday 8AM-5PM and Friday 8AM-12Noon.  If it is after hours or on the weekend, please  call [facility], [phone], and ask to speak to the surgeon on call for Dr. Constance Haw at [facility].    Open Hernia Repair, Adult, Care After What can I expect after the procedure? After the procedure, it is common to have:  Mild discomfort.  Slight bruising.  Mild swelling.  Pain in the belly (abdomen).  A small amount of blood from the cut from surgery (incision). Follow these instructions at home: Your doctor may give you more specific instructions. If you have problems, call your doctor. Medicines  Take over-the-counter and prescription medicines only as told by your doctor.  If told, take steps to prevent problems with pooping (constipation). You may need to: ? Drink enough fluid to keep your pee (urine) pale yellow. ? Take medicines. You will be told what medicines to take. ? Eat foods that are high in fiber. These include beans, whole grains, and fresh fruits and vegetables. ? Limit foods that are high in fat and sugar. These include fried or sweet foods.  Ask your doctor if you should avoid driving or using machines while you are taking your medicine. Incision care  Follow instructions from your doctor about how to take care of your incision. Make sure you: ? Wash your hands with soap and water for at least 20 seconds before and after you change your bandage (dressing). If you cannot use soap and water, use hand sanitizer. ? Change your bandage. ? Leave stitches or skin glue in place for at least 2 weeks. ? Leave tape strips alone unless you are told to take them off. You may trim the edges of the tape strips if they curl up.  Check your incision every day for signs of infection. Check for: ? More redness, swelling, or pain. ? More fluid or blood. ? Warmth. ? Pus or a bad smell.  Wear loose, soft clothing while your incision heals.   Activity  Rest as told by your doctor.  Do not lift anything that is heavier than 10 lb (4.5 kg), or the limit that you  are told.  Do not play contact sports until your doctor says that this is safe.  If you were given a sedative during your procedure, do not drive or use machines until your doctor says that it is safe. A sedative is a medicine that helps you relax.  Return to your normal activities when your doctor says that it is safe.   General instructions  Do not take baths, swim, or use a hot tub.   You may shower.  Hold a pillow over your belly when you cough or sneeze. This helps with pain.  Do not smoke or use any products that contain nicotine or tobacco. If you need help quitting, ask your doctor.  Keep all follow-up visits. Contact a doctor if:  You have any of these signs of infection in or around your incision: ? More redness, swelling, or pain. ? More fluid or blood. ? Warmth. ?  Pus. ? A bad smell.  You have a fever or chills.  You have blood in your poop (stool).  You have not pooped (had a bowel movement) in 2-3 days.  Medicine does not help your pain. Get help right away if:  You have chest pain, or you are short of breath.  You feel faint or light-headed.  You have very bad pain.  You vomit and your pain is worse.  You have pain, swelling, or redness in a leg. These symptoms may be an emergency. Get help right away. Call your local emergency services (911 in the [place]  Do not wait to see if the symptoms will go away.  Do not drive yourself to the hospital. Summary  After this procedure, it is common to have mild discomfort, slight bruising, and mild swelling.  Follow instructions from your doctor about how to take care of your cut from surgery (incision). Check every day for signs of infection.  Do not lift heavy objects or play contact sports until your doctor says it is safe.  Return to your normal activities as told by your doctor. This information is not intended to replace advice given to you by your health care provider. Make sure you discuss any  questions you have with your health care provider. Document Revised: 01/18/2020 Document Reviewed: 01/18/2020 Elsevier Patient Education  2021 [employer] [place] UNTIL Tuesday January 18,2022. DO NOT USE ADDITIONAL NUMBING MEDICATIONS WITHOUT CONSULTING A PHYSICIAN   Bupivacaine Liposomal Suspension for Injection What is this medicine? BUPIVACAINE LIPOSOMAL (bue PIV a kane LIP oh som al) is an anesthetic. It causes loss of feeling in the skin or other tissues. It is used to prevent and to treat pain from some procedures. This medicine may be used for other purposes; ask your health care provider or pharmacist if you have questions. COMMON BRAND NAME(S): EXPAREL What should I tell my health care provider before I take this medicine? They need to know if you have any of these conditions:  G6PD deficiency  heart disease  kidney disease  liver disease  low blood pressure  lung or breathing disease, like asthma  an unusual or allergic reaction to bupivacaine, other medicines, foods, dyes, or preservatives  pregnant or trying to get pregnant  breast-feeding How should I use this medicine? This medicine is injected into the affected area. It is given by a health care provider in a hospital or clinic setting. Talk to your health care provider about the use of this medicine in children. While it may be given to children as young as 6 years for selected conditions, precautions do apply. Overdosage: If you think you have taken too much of this medicine contact a poison control center or emergency room at once. NOTE: This medicine is only for you. Do not share this medicine with others. What if I miss a dose? This does not apply. What may interact with this medicine? This medicine may interact with the following medications:  acetaminophen  certain antibiotics like dapsone, nitrofurantoin, aminosalicylic acid, sulfonamides  certain medicines for  seizures like phenobarbital, phenytoin, valproic acid  chloroquine  cyclophosphamide  flutamide  hydroxyurea  ifosfamide  metoclopramide  nitric oxide  nitroglycerin  nitroprusside  nitrous oxide  other local anesthetics like lidocaine, pramoxine, tetracaine  primaquine  quinine  rasburicase  sulfasalazine This list may not describe all possible interactions. Give your health care provider a list of all the medicines, herbs, non-prescription drugs,  or dietary supplements you use. Also tell them if you smoke, drink alcohol, or use illegal drugs. Some items may interact with your medicine. What should I watch for while using this medicine? Your condition will be monitored carefully while you are receiving this medicine. Be careful to avoid injury while the area is numb, and you are not aware of pain. What side effects may I notice from receiving this medicine? Side effects that you should report to your doctor or health care professional as soon as possible:  allergic reactions like skin rash, itching or hives, swelling of the face, lips, or tongue  seizures  signs and symptoms of a dangerous change in heartbeat or heart rhythm like chest pain; dizziness; fast, irregular heartbeat; palpitations; feeling faint or lightheaded; falls; breathing problems  signs and symptoms of methemoglobinemia such as pale, gray, or blue colored skin; headache; fast heartbeat; shortness of breath; feeling faint or lightheaded, falls; tiredness Side effects that usually do not require medical attention (report to your doctor or health care professional if they continue or are bothersome):  anxious  back pain  changes in taste  changes in vision  constipation  dizziness  fever  nausea, vomiting This list may not describe all possible side effects. Call your doctor for medical advice about side effects. You may report side effects to FDA at 1-800-FDA-1088. Where should I keep my  medicine? This drug is given in a hospital or clinic and will not be stored at home. NOTE: This sheet is a summary. It may not cover all possible information. If you have questions about this medicine, talk to your doctor, pharmacist, or health care provider.  2021 Elsevier/Gold Standard (2019-09-10 12:24:57)      General Anesthesia, Adult, Care After This sheet gives you information about how to care for yourself after your procedure. Your health care provider may also give you more specific instructions. If you have problems or questions, contact your health care provider. What can I expect after the procedure? After the procedure, the following side effects are common:  Pain or discomfort at the IV site.  Nausea.  Vomiting.  Sore throat.  Trouble concentrating.  Feeling cold or chills.  Feeling weak or tired.  Sleepiness and fatigue.  Soreness and body aches. These side effects can affect parts of the body that were not involved in surgery. Follow these instructions at home: For the time period you were told by your health care provider:  Rest.  Do not participate in activities where you could fall or become injured.  Do not drive or use machinery.  Do not drink alcohol.  Do not take sleeping pills or medicines that cause drowsiness.  Do not make important decisions or sign legal documents.  Do not take care of children on your own.   Eating and drinking  Follow any instructions from your health care provider about eating or drinking restrictions.  When you feel hungry, start by eating small amounts of foods that are soft and easy to digest (bland), such as toast. Gradually return to your regular diet.  Drink enough fluid to keep your urine pale yellow.  If you vomit, rehydrate by drinking water, juice, or clear broth. General instructions  If you have sleep apnea, surgery and certain medicines can increase your risk for breathing problems. Follow  instructions from your health care provider about wearing your sleep device: ? Anytime you are sleeping, including during daytime naps. ? While taking prescription pain medicines, sleeping medicines, or  medicines that make you drowsy.  Have a responsible adult stay with you for the time you are told. It is important to have someone help care for you until you are awake and alert.  Return to your normal activities as told by your health care provider. Ask your health care provider what activities are safe for you.  Take over-the-counter and prescription medicines only as told by your health care provider.  If you smoke, do not smoke without supervision.  Keep all follow-up visits as told by your health care provider. This is important. Contact a health care provider if:  You have nausea or vomiting that does not get better with medicine.  You cannot eat or drink without vomiting.  You have pain that does not get better with medicine.  You are unable to pass urine.  You develop a skin rash.  You have a fever.  You have redness around your IV site that gets worse. Get help right away if:  You have difficulty breathing.  You have chest pain.  You have blood in your urine or stool, or you vomit blood. Summary  After the procedure, it is common to have a sore throat or nausea. It is also common to feel tired.  Have a responsible adult stay with you for the time you are told. It is important to have someone help care for you until you are awake and alert.  When you feel hungry, start by eating small amounts of foods that are soft and easy to digest (bland), such as toast. Gradually return to your regular diet.  Drink enough fluid to keep your urine pale yellow.  Return to your normal activities as told by your health care provider. Ask your health care provider what activities are safe for you. This information is not intended to replace advice given to you by your health care  provider. Make sure you discuss any questions you have with your health care provider. Document Revised: 02/18/2020 Document Reviewed: 09/17/2019 Elsevier Patient Education  2021 [place].     Oxycodone Capsules or Tablets What is this medicine? OXYCODONE (ox i KOE done) is a pain reliever, also called an opioid. It treats severe pain. This medicine may be used for other purposes; ask your health care provider or pharmacist if you have questions. COMMON BRAND NAME(S): Dazidox, Endocodone, Oxaydo, OXECTA, OxyIR, Percolone, Roxicodone, Roxybond What should I tell my health care provider before I take this medicine? They need to know if you have any of these conditions:  brain tumor  drug abuse or addiction  head injury  heart disease  if you often drink alcohol  kidney disease  liver disease  low adrenal gland function  lung disease, asthma, or breathing problem  seizures  stomach or intestine problems  taken an MAOI such as Marplan, Nardil, or Parnate in the last 14 days  an unusual or allergic reaction to oxycodone, other drugs, foods, dyes, or preservatives  pregnant or trying to get pregnant  breast-feeding How should I use this medicine? Take this medicine by mouth with water. Take it as directed on the prescription label at the same time every day. You can take it with or without food. If it upsets your stomach, take it with food. Keep taking it unless your health care provider tells you to stop. Some brands of this medicine, like Oxaydo, have special instructions. Ask your doctor or pharmacist if these directions are for you: Do not cut, crush or chew this  medicine. Do not wet, soak, or lick the tablet before you take it. A special MedGuide will be given to you by the pharmacist with each prescription and refill. Be sure to read this information carefully each time. Talk to your pediatrician regarding the use of this medicine in children. Special care may be  needed. Overdosage: If you think you have taken too much of this medicine contact a poison control center or emergency room at once. NOTE: This medicine is only for you. Do not share this medicine with others. What if I miss a dose? If you miss a dose, take it as soon as you can. If it is almost time for your next dose, take only that dose. Do not take double or extra doses. What may interact with this medicine? Do not take this medicine with any of the following medications:  safinamide This medicine may interact with the following medications:  alcohol  antihistamines for allergy, cough, and cold  atropine  certain antivirals for HIV or hepatitis  certain antibiotics like clarithromycin, erythromycin, linezolid, rifampin  certain medicines for anxiety or sleep  certain medicines for bladder problems like oxybutynin, tolterodine  certain medicines for depression like amitriptyline, fluoxetine, sertraline  certain medicines for fungal infections like ketoconazole, itraconazole, posaconazole  certain medicines for migraine headache like almotriptan, eletriptan, frovatriptan, naratriptan, rizatriptan, sumatriptan, zolmitriptan  certain medicines for nausea or vomiting like dolasetron, granisetron, ondansetron, palonosetron  certain medicines for Parkinson's disease like benztropine, trihexyphenidyl  certain medicines for seizures like carbamazepine, phenobarbital, phenytoin, primidone  certain medicines for stomach problems like dicyclomine, hyoscyamine  certain medicines for travel sickness like scopolamine  diuretics  general anesthetics like halothane, isoflurane, methoxyflurane, propofol  ipratropium  MAOIs like Marplan, Nardil, and Parnate  medicines that relax muscles  methylene blue  other narcotic medicines for pain or cough  phenothiazines like chlorpromazine, mesoridazine, prochlorperazine, thioridazine This list may not describe all possible  interactions. Give your health care provider a list of all the medicines, herbs, non-prescription drugs, or dietary supplements you use. Also tell them if you smoke, drink alcohol, or use illegal drugs. Some items may interact with your medicine. What should I watch for while using this medicine? Tell your health care provider if your pain does not go away, if it gets worse, or if you have new or a different type of pain. You may develop tolerance to this medicine. Tolerance means that you will need a higher dose of the medicine for pain relief. Tolerance is normal and is expected if you take this medicine for a long time. Do not suddenly stop taking your medicine because you may develop a severe reaction. Your body becomes used to the medicine. This does NOT mean you are addicted. Addiction is a behavior related to getting and using a medicine for a nonmedical reason. If you have pain, you have a medical reason to take pain medicine. Your health care provider will tell you how much medicine to take. If your health care provider wants you to stop the medicine, the dose will be slowly lowered over time to avoid any side effects. If you take other medicines that also cause drowsiness such as other narcotic pain medicines, benzodiazepines, or other medicines for sleep, you may have more side effects. Give your health care provider a list of all medicines you use. He or she will tell you how much medicine to take. Do not take more medicine than directed. Get emergency help right away if you have trouble  breathing or are unusually tired or sleepy. Talk to your health care provider about naloxone and how to get it. Naloxone is an emergency medicine used for an opioid overdose. An overdose can happen if you take too much opioid. It can also happen if an opioid is taken with some other medicines or substances, such as alcohol. Know the symptoms of an overdose, such as trouble breathing, unusually tired or sleepy, or  not being able to respond or wake up. Make sure to tell caregivers and close contacts where it is stored. Make sure they know how to use it. After naloxone is given, you must get emergency help right away. Naloxone is a temporary treatment. Repeat doses may be needed. You may get drowsy or dizzy. Do not drive, use machinery, or do anything that needs mental alertness until you know how this medicine affects you. Do not stand up or sit up quickly, especially if you are an older patient. This reduces the risk of dizzy or fainting spells. Alcohol may interfere with the effect of this medicine. Avoid alcoholic drinks. This medicine will cause constipation. If you do not have a bowel movement for 3 days, call your health care provider. Your mouth may get dry. Chewing sugarless gum or sucking hard candy and drinking plenty of water may help. Contact your health care provider if the problem does not go away or is severe. What side effects may I notice from receiving this medicine? Side effects that you should report to your doctor or health care professional as soon as possible:  allergic reactions (skin rash, itching or hives; swelling of the face, lips, or tongue)  confusion  kidney injury (trouble passing urine or change in the amount of urine)  low adrenal gland function (nausea; vomiting; loss of appetite; unusually weak or tired; dizziness; low blood pressure)  low blood pressure (dizziness; feeling faint or lightheaded, falls; unusually weak or tired)  serotonin syndrome (irritable; confusion; diarrhea; fast or irregular heartbeat; muscle twitching; stiff muscles; trouble walking; sweating; high fever; seizures; chills; vomiting)  trouble breathing Side effects that usually do not require medical attention (report to your doctor or health care professional if they continue or are bothersome):  constipation  dry mouth  nausea, vomiting  tiredness This list may not describe all possible  side effects. Call your doctor for medical advice about side effects. You may report side effects to FDA at 1-800-FDA-1088. Where should I keep my medicine? Keep out of the reach of children and pets. This medicine can be abused. Keep it in a safe place to protect it from theft. Do not share it with anyone. It is only for you. Selling or giving away this medicine is dangerous and against the law. Store at [employer] [address] (77 degrees F). Protect from light and moisture. Keep the container tightly closed. Get rid of any unused medicine after the expiration date. This medicine may cause harm and death if it is taken by other adults, children, or pets. It is important to get rid of the medicine as soon as you no longer need it or it is expired. You can do this in two ways:  Take the medicine to a medicine take-back program. Check with your pharmacy or law enforcement to find a location.  If you cannot return the medicine, flush it down the toilet. NOTE: This sheet is a summary. It may not cover all possible information. If you have questions about this medicine, talk to your doctor, pharmacist, or  health care provider.  2021 Elsevier/Gold Standard (2020-04-25 13:26:06)    Docusate capsules What is this medicine? DOCUSATE (doc CUE sayt) is stool softener. It helps prevent constipation and straining or discomfort associated with hard or dry stools. This medicine may be used for other purposes; ask your health care provider or pharmacist if you have questions. COMMON BRAND NAME(S): [employer] Stool Softner, Colace, Colace Clear, Correctol, D.O.S., DC, Doc-Q-Lace, DocuLace, Docusoft S, DOK, DOK Extra Strength, Dulcolax, Genasoft, Kao-Tin, Kaopectate Liqui-Gels, Phillips Stool Softener, Plus PHARMA, Stool Softener, Stool Softner DC, Sulfolax, Sur-Q-Lax, Surfak, Uni-Ease What should I tell my health care provider before I take this medicine? They need to know if you have any of these conditions:  nausea or  vomiting  severe constipation  stomach pain  sudden change in bowel habit lasting more than 2 weeks  an unusual or allergic reaction to docusate, other medicines, foods, dyes, or preservatives  pregnant or trying to get pregnant  breast-feeding How should I use this medicine? Take this medicine by mouth with a glass of water. Follow the directions on the label. Take your doses at regular intervals. Do not take your medicine more often than directed. Talk to your pediatrician regarding the use of this medicine in children. While this medicine may be prescribed for children as young as 2 years for selected conditions, precautions do apply. Overdosage: If you think you have taken too much of this medicine contact a poison control center or emergency room at once. NOTE: This medicine is only for you. Do not share this medicine with others. What if I miss a dose? If you miss a dose, take it as soon as you can. If it is almost time for your next dose, take only that dose. Do not take double or extra doses. What may interact with this medicine?  mineral oil This list may not describe all possible interactions. Give your health care provider a list of all the medicines, herbs, non-prescription drugs, or dietary supplements you use. Also tell them if you smoke, drink alcohol, or use illegal drugs. Some items may interact with your medicine. What should I watch for while using this medicine? Do not use for more than one week without advice from your doctor or health care professional. If your constipation returns, check with your doctor or health care professional. Drink plenty of water while taking this medicine. Drinking water helps decrease constipation. Stop using this medicine and contact your doctor or health care professional if you experience any rectal bleeding or do not have a bowel movement after use. These could be signs of a more serious condition. What side effects may I notice from  receiving this medicine? Side effects that you should report to your doctor or health care professional as soon as possible:  allergic reactions like skin rash, itching or hives, swelling of the face, lips, or tongue Side effects that usually do not require medical attention (report to your doctor or health care professional if they continue or are bothersome):  diarrhea  stomach cramps  throat irritation This list may not describe all possible side effects. Call your doctor for medical advice about side effects. You may report side effects to FDA at 1-800-FDA-1088. Where should I keep my medicine? Keep out of the reach of children. Store at room temperature between 15 and 30 degrees C (59 and 86 degrees F). Throw away any unused medicine after the expiration date. NOTE: This sheet is a summary. It may not cover all  possible information. If you have questions about this medicine, talk to your doctor, pharmacist, or health care provider.  2021 Elsevier/Gold Standard (2007-09-25 15:56:49)    Ondansetron oral dissolving tablet What is this medicine? ONDANSETRON (on DAN se tron) is used to treat nausea and vomiting caused by chemotherapy. It is also used to prevent or treat nausea and vomiting after surgery. This medicine may be used for other purposes; ask your health care provider or pharmacist if you have questions. COMMON BRAND NAME(S): Zofran ODT What should I tell my health care provider before I take this medicine? They need to know if you have any of these conditions:  heart disease  history of irregular heartbeat  liver disease  low levels of magnesium or potassium in the blood  an unusual or allergic reaction to ondansetron, granisetron, other medicines, foods, dyes, or preservatives  pregnant or trying to get pregnant  breast-feeding How should I use this medicine? These tablets are made to dissolve in the mouth. Do not try to push the tablet through the foil  backing. With dry hands, peel away the foil backing and gently remove the tablet. Place the tablet in the mouth and allow it to dissolve, then swallow. While you may take these tablets with water, it is not necessary to do so. Talk to your pediatrician regarding the use of this medicine in children. Special care may be needed. Overdosage: If you think you have taken too much of this medicine contact a poison control center or emergency room at once. NOTE: This medicine is only for you. Do not share this medicine with others. What if I miss a dose? If you miss a dose, take it as soon as you can. If it is almost time for your next dose, take only that dose. Do not take double or extra doses. What may interact with this medicine? Do not take this medicine with any of the following medications:  apomorphine  certain medicines for fungal infections like fluconazole, itraconazole, ketoconazole, posaconazole, voriconazole  cisapride  dronedarone  pimozide  thioridazine This medicine may also interact with the following medications:  carbamazepine  certain medicines for depression, anxiety, or psychotic disturbances  fentanyl  linezolid  MAOIs like Carbex, Eldepryl, Marplan, Nardil, and Parnate  methylene blue (injected into a vein)  other medicines that prolong the QT interval (cause an abnormal heart rhythm) like dofetilide, ziprasidone  phenytoin  rifampicin  tramadol This list may not describe all possible interactions. Give your health care provider a list of all the medicines, herbs, non-prescription drugs, or dietary supplements you use. Also tell them if you smoke, drink alcohol, or use illegal drugs. Some items may interact with your medicine. What should I watch for while using this medicine? Check with your doctor or health care professional as soon as you can if you have any sign of an allergic reaction. What side effects may I notice from receiving this  medicine? Side effects that you should report to your doctor or health care professional as soon as possible:  allergic reactions like skin rash, itching or hives, swelling of the face, lips, or tongue  breathing problems  confusion  dizziness  fast or irregular heartbeat  feeling faint or lightheaded, falls  fever and chills  loss of balance or coordination  seizures  sweating  swelling of the hands and feet  tightness in the chest  tremors  unusually weak or tired Side effects that usually do not require medical attention (report to your  doctor or health care professional if they continue or are bothersome):  constipation or diarrhea  headache This list may not describe all possible side effects. Call your doctor for medical advice about side effects. You may report side effects to FDA at 1-800-FDA-1088. Where should I keep my medicine? Keep out of the reach of children. Store between 2 and 30 degrees C (36 and 86 degrees F). Throw away any unused medicine after the expiration date. NOTE: This sheet is a summary. It may not cover all possible information. If you have questions about this medicine, talk to your doctor, pharmacist, or health care provider.  2021 Elsevier/Gold Standard (2018-05-27 07:14:10)

## 2020-07-01 NOTE — Op Note (Signed)
[facility] Operative Note  07/01/20  Preoperative Diagnosis: Incarcerated Left inguinal hernia    Postoperative Diagnosis: Same   Procedure(s) Performed: Left inguinal hernia repair with mesh   Surgeon: [name]. Sean Haw, MD   Assistants: No qualified resident was available   Anesthesia: General endotracheal   Anesthesiologist: Sean Killings, MD    Specimens: None   Estimated Blood Loss: Minimal   Blood Replacement: None    Complications: None   Wound Class: Clean    Operative Indications: [name] is a 80 yo with a left inguinal hernia that as incarcerated earlier this week. He had to come to the ED for me to reduce the hernia and we sent him home with close follow up and plan to hold his coumadin. We scheduled him for surgery and his INR was 1.4 today. He has not had any more incarceration symptoms but the hernia remains stuck out but soft today.  We discussed repair and risk of bleeding, infection, mesh use, recurrence, and he opted to proceed.   Findings: Partially incarcerated hernia with omentum and colon in hernia sac   Procedure: The patient was taken to the operating room and placed supine. General endotracheal anesthesia was induced. Intravenous antibiotics were administered per protocol.  A time out was preformed verifying the correct patient, procedure, site, positioning and implants.  The left groin and scrotum were prepared and draped in the usual sterile fashion.   An incision was made in a natural skin crease between the pubic tubercle and the anterior superior iliac spine.  The incision was deepened with electrocautery through Scarpa's and Camper's fascia until the aponeurosis of the external oblique was encountered.  This was cleaned and the external ring was exposed.  An incision was made in the midportion of the external oblique aponeurosis in the direction of its fibers. The ilioinguinal nerve was identified and was protected  throughout the dissection.  Flaps of the external oblique were developed cephalad and inferiorly.    The cord was identified and it was gently dissented free at the pubic tubercle and encircled with a Penrose drain.  Attention was then directed at the anteromedial aspect of the cord, where an indirect hernia sac was identified.  The sac was carefully dissected free from the cord down to the level of the internal ring.  The vas and testicular vessels were identified and protected from harm.  Once the sac was dissected free from the cords, the Penrose was placed around the cord which was retracted inferiorly out of the field of view.  The hernia was reduced into the internal ring without difficulty. Two large Perfix Plug was placed into the defect and filled the space and were sutured to the edge of the ring and together with 2-0 Novafil.  Attention was then turned to the floor of the canal, which was grossly weakened without any defined defect or sac.  The Perfix Mesh Patch was sutured to the inguinal ligament inferiorly starting at the pubic tubercle using 2-0 Novafil interrupted sutures.  The mesh was sutured superiorly to the conjoint tendon using 2-0 Novafil interrupted sutures.  Care was taken to ensure the mesh was placed in a relaxed fashion to avoid excessive tension and no neurovascular structures were caught in the repair.  Laterally the tails of the mesh were crossed and the internal ring was recreated, allowing for passage of cords without tension.   Hemostasis was adequate.  The Penrose was removed.  The external oblique aponeurosis was  closed with a 2-0 Vicryl suture in a running fashion, taking care to not catch the ilioinguinal nerve in the suture line.  Scarpa's fashion was closed with 3-0 Vicryl interrupted sutures. The skin was closed with a subcuticular 4-0 Monocryl suture.  Dermabond was applied.   The testis was gently pulled down into its anatomic position in the scrotum.  The patient  tolerated the procedure well and was taken to the PACU in stable condition. All counts were correct at the end of the case.        Sean Labrum, MD [facility] [address] [place], [place] [address] [phone] (office)

## 2020-07-01 NOTE — Interval H&P Note (Signed)
History and Physical Interval Note:  07/01/2020 8:47 AM  [name]  has presented today for surgery, with the diagnosis of Inguinal hernia, left.  The various methods of treatment have been discussed with the patient and family. After consideration of risks, benefits and other options for treatment, the patient has consented to  Procedure(s): LEFT INGUINAL HERNIA REPAIR WITH MESH (Left) as a surgical intervention.  The patient's history has been reviewed, patient examined, no change in status, stable for surgery.  I have reviewed the patient's chart and labs.  Questions were answered to the patient's satisfaction.    No changes. INR 1.4.   Virl Cagey

## 2020-07-01 NOTE — Progress Notes (Signed)
[facility]  Updated wife. Rx sent to [facility]. Hold coumadin for now. Patient to call office Tuesday to notify us of his bruising before restarting coumadin.   Curlene Labrum, MD [facility] [address] [place], [place] [address] [phone] (office)

## 2020-07-01 NOTE — Anesthesia Preprocedure Evaluation (Signed)
Anesthesia Evaluation  Patient identified by MRN, date of birth, ID band Patient awake    Reviewed: Allergy & Precautions, NPO status , Patient's Chart, lab work & pertinent test results  History of Anesthesia Complications Negative for: history of anesthetic complications  Airway Mallampati: II  TM Distance: >3 FB Neck ROM: Full    Dental  (+) Edentulous Upper, Edentulous Lower   Pulmonary COPD,  COPD inhaler, former smoker,    Pulmonary exam normal breath sounds clear to auscultation       Cardiovascular Exercise Tolerance: Good + CAD  + dysrhythmias (on coumadin - last dose -) Atrial Fibrillation  Rhythm:Irregular  The cavity size was normal. Wall thickness was  normal. Systolic function was normal. The estimated ejection  fraction was in the range of 60% to 65%. Wall motion was normal;  there were no regional wall motion abnormalities. The study was not  technically sufficient to allow evaluation of LV diastolic  dysfunction due to atrial fibrillation.    Neuro/Psych PSYCHIATRIC DISORDERS Anxiety CVA (generalized weakness), Residual Symptoms    GI/Hepatic Neg liver ROS, GERD  Medicated,  Endo/Other  negative endocrine ROS  Renal/GU Renal disease     Musculoskeletal negative musculoskeletal ROS (+)   Abdominal   Peds  Hematology negative hematology ROS (+)   Anesthesia Other Findings   Reproductive/Obstetrics negative OB ROS                            Anesthesia Physical Anesthesia Plan  ASA: III  Anesthesia Plan: General   Post-op Pain Management:    Induction: Intravenous  PONV Risk Score and Plan: 3 and Ondansetron  Airway Management Planned: Oral ETT  Additional Equipment:   Intra-op Plan:   Post-operative Plan: Extubation in OR  Informed Consent: I have reviewed the patients History and Physical, chart, labs and discussed the procedure including the risks,  benefits and alternatives for the proposed anesthesia with the patient or authorized representative who has indicated his/her understanding and acceptance.       Plan Discussed with: CRNA and Surgeon  Anesthesia Plan Comments:        Anesthesia Quick Evaluation

## 2020-07-01 NOTE — Anesthesia Postprocedure Evaluation (Signed)
Anesthesia Post Note  Patient: [name]  Procedure(s) Performed: LEFT INGUINAL HERNIA REPAIR WITH MESH (Left Inguinal)  Patient location during evaluation: Phase II Anesthesia Type: General Level of consciousness: awake and oriented Pain management: pain level controlled Vital Signs Assessment: post-procedure vital signs reviewed and stable Respiratory status: spontaneous breathing and respiratory function stable Cardiovascular status: blood pressure returned to baseline and stable Postop Assessment: no apparent nausea or vomiting Anesthetic complications: no   No complications documented.   Last Vitals:  Vitals:   07/01/20 1215 07/01/20 1235  BP: (!) 153/83 (!) 165/86  Pulse: 71 82  Resp: 14   Temp:  36.5 C  SpO2: 96% 94%    Last Pain:  Vitals:   07/01/20 1235  TempSrc: Oral  PainSc: 4                  Karna Dupes

## 2020-07-01 NOTE — Transfer of Care (Signed)
Immediate Anesthesia Transfer of Care Note  Patient: [name]  Procedure(s) Performed: LEFT INGUINAL HERNIA REPAIR WITH MESH (Left Inguinal)  Patient Location: PACU  Anesthesia Type:General  Level of Consciousness: drowsy  Airway & Oxygen Therapy: Patient Spontanous Breathing and Patient connected to nasal cannula oxygen  Post-op Assessment: Report given to RN and Post -op Vital signs reviewed and stable  Post vital signs: Reviewed and stable  Last Vitals:  Vitals Value Taken Time  BP 134/74 07/01/20 1102  Temp    Pulse 78 07/01/20 1103  Resp 12 07/01/20 1103  SpO2 100 % 07/01/20 1103  Vitals shown include unvalidated device data.  Last Pain:  Vitals:   07/01/20 0730  TempSrc: Oral  PainSc: 2       Patients Stated Pain Goal: 7 (72/62/03 5597)  Complications: No complications documented.

## 2020-07-01 NOTE — Anesthesia Procedure Notes (Signed)
Procedure Name: Intubation Date/Time: 07/01/2020 9:18 AM Performed by: Karna Dupes, CRNA Pre-anesthesia Checklist: Patient identified, Emergency Drugs available, Suction available and Patient being monitored Patient Re-evaluated:Patient Re-evaluated prior to induction Oxygen Delivery Method: Circle system utilized Preoxygenation: Pre-oxygenation with 100% oxygen Induction Type: IV induction Ventilation: Mask ventilation without difficulty Laryngoscope Size: Mac and 4 Grade View: Grade I Tube type: Oral Number of attempts: 1 Airway Equipment and Method: Stylet Placement Confirmation: ETT inserted through vocal cords under direct vision,  positive ETCO2 and breath sounds checked- equal and bilateral Tube secured with: Tape Dental Injury: Teeth and Oropharynx as per pre-operative assessment

## 2020-07-03 ENCOUNTER — Encounter: Payer: Self-pay | Admitting: Family Medicine

## 2020-07-04 ENCOUNTER — Telehealth: Payer: Self-pay | Admitting: *Deleted

## 2020-07-04 NOTE — Telephone Encounter (Signed)
Pt called stating he had surgery on Friday- states he's not had any coumadin since Sunday of last week, but decided to take one yesterday. Said he wasn't told by anyone to take one, he just did.   [phone]

## 2020-07-04 NOTE — Telephone Encounter (Signed)
Returned call to pt, pt Wellsite geologist did not tell him to stay off his Warfarin after surgery, therefor he resumed yesterday 07/03/20.  Pt denies any bleeding post surgery took 1 tablet of Warfarin on Sunday pm.  Advised pt to resume his previous dosage regimen 1 tablet QD except 1/2 tablet on Tuesdays and Fridays.  Advised pt to go ahead and take 1 tablet tomorrow instead of 1/2 tablet, but then resume previous dosage regimen.  Made pt a follow-up appt in [place] on 07/11/20 at 10:15am.  Pt verbalized understanding and thanked me for the help and the call back.

## 2020-07-07 ENCOUNTER — Encounter (HOSPITAL_COMMUNITY): Payer: Self-pay | Admitting: General Surgery

## 2020-07-11 ENCOUNTER — Ambulatory Visit (INDEPENDENT_AMBULATORY_CARE_PROVIDER_SITE_OTHER): Payer: Medicare Other | Admitting: *Deleted

## 2020-07-11 ENCOUNTER — Other Ambulatory Visit: Payer: Self-pay

## 2020-07-11 ENCOUNTER — Telehealth: Payer: Self-pay

## 2020-07-11 DIAGNOSIS — I482 Chronic atrial fibrillation, unspecified: Secondary | ICD-10-CM | POA: Diagnosis not present

## 2020-07-11 DIAGNOSIS — I4891 Unspecified atrial fibrillation: Secondary | ICD-10-CM

## 2020-07-11 DIAGNOSIS — Z5181 Encounter for therapeutic drug level monitoring: Secondary | ICD-10-CM

## 2020-07-11 LAB — POCT INR: INR: 1.3 — AB (ref 2.0–3.0)

## 2020-07-11 MED ORDER — REPATHA SURECLICK 140 MG/ML ~~LOC~~ SOAJ
140.0000 mg | SUBCUTANEOUS | 11 refills | Status: DC
Start: 2020-07-11 — End: 2021-05-19

## 2020-07-11 NOTE — Telephone Encounter (Signed)
-----   Message from Rockne Menghini, RPH-CPP sent at 07/11/2020 10:13 AM EST ----- Good morning!    Going thru the morning stack of faxes - I'll send you individual notes, don't mean to overwhelm you, but I find it easier to know what I'm doing if I can delete each as I complete.  If you'd rather I do this in another way, just shout out.  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  Can you make sure he's set up with [facility]?  K

## 2020-07-11 NOTE — Patient Instructions (Signed)
S/P hernia repair 07/01/20  Was off coumadin 7-8 days before per pt. He started back taking warfarin 1 tablet daily on 07/04/20 Take 1 tablet daily until INR check next week

## 2020-07-11 NOTE — Telephone Encounter (Signed)
Called and lmomed the pt to call us back so that I can get some patient assistance through the [employer] for his pcsk9 inhibitor. Will await callback from pt

## 2020-07-19 ENCOUNTER — Ambulatory Visit (INDEPENDENT_AMBULATORY_CARE_PROVIDER_SITE_OTHER): Payer: Medicare Other | Admitting: *Deleted

## 2020-07-19 DIAGNOSIS — Z5181 Encounter for therapeutic drug level monitoring: Secondary | ICD-10-CM

## 2020-07-19 DIAGNOSIS — I4891 Unspecified atrial fibrillation: Secondary | ICD-10-CM

## 2020-07-19 DIAGNOSIS — I482 Chronic atrial fibrillation, unspecified: Secondary | ICD-10-CM | POA: Diagnosis not present

## 2020-07-19 LAB — POCT INR: INR: 1.8 — AB (ref 2.0–3.0)

## 2020-07-19 NOTE — Patient Instructions (Signed)
S/P hernia repair 07/01/20   Continue warfarin 1 tablet daily  Recheck in 2 wks

## 2020-07-22 DIAGNOSIS — Q100 Congenital ptosis: Secondary | ICD-10-CM | POA: Diagnosis not present

## 2020-07-22 DIAGNOSIS — H02884 Meibomian gland dysfunction left upper eyelid: Secondary | ICD-10-CM | POA: Diagnosis not present

## 2020-07-22 DIAGNOSIS — H02036 Senile entropion of left eye, unspecified eyelid: Secondary | ICD-10-CM | POA: Diagnosis not present

## 2020-07-22 DIAGNOSIS — H0222B Mechanical lagophthalmos left eye, upper and lower eyelids: Secondary | ICD-10-CM | POA: Diagnosis not present

## 2020-07-28 ENCOUNTER — Encounter: Payer: Self-pay | Admitting: General Surgery

## 2020-07-28 ENCOUNTER — Ambulatory Visit (INDEPENDENT_AMBULATORY_CARE_PROVIDER_SITE_OTHER): Payer: Medicare Other | Admitting: General Surgery

## 2020-07-28 ENCOUNTER — Ambulatory Visit (HOSPITAL_COMMUNITY)
Admission: RE | Admit: 2020-07-28 | Discharge: 2020-07-28 | Disposition: A | Discharge status: Home / Self Care | Payer: Medicare Other | Source: Ambulatory Visit | Attending: General Surgery | Admitting: General Surgery

## 2020-07-28 ENCOUNTER — Telehealth: Payer: Self-pay | Admitting: Family Medicine

## 2020-07-28 ENCOUNTER — Other Ambulatory Visit: Payer: Self-pay

## 2020-07-28 VITALS — BP 144/76 | HR 79 | Temp 98.4°F | Resp 14 | Ht 66.0 in | Wt 171.0 lb

## 2020-07-28 DIAGNOSIS — I82890 Acute embolism and thrombosis of other specified veins: Secondary | ICD-10-CM | POA: Diagnosis not present

## 2020-07-28 DIAGNOSIS — I82432 Acute embolism and thrombosis of left popliteal vein: Secondary | ICD-10-CM | POA: Diagnosis not present

## 2020-07-28 DIAGNOSIS — M7989 Other specified soft tissue disorders: Secondary | ICD-10-CM

## 2020-07-28 DIAGNOSIS — K4030 Unilateral inguinal hernia, with obstruction, without gangrene, not specified as recurrent: Secondary | ICD-10-CM

## 2020-07-28 DIAGNOSIS — I4891 Unspecified atrial fibrillation: Secondary | ICD-10-CM

## 2020-07-28 DIAGNOSIS — I82412 Acute embolism and thrombosis of left femoral vein: Secondary | ICD-10-CM | POA: Diagnosis not present

## 2020-07-28 NOTE — Telephone Encounter (Signed)
Dr. Constance Haw called to let us know that patient has developed a DVT.  He is back on his Coumadin after having an incarcerated hernia surgery.  This is managed by the anticoagulation clinic.  His cardiologist is Dr. Harl Bowie.  Dr. Constance Haw did send him to have an INR completed today.  Please call patient and get him scheduled for a follow-up with me in the near future.  Please make this a 30-minute appointment.

## 2020-07-28 NOTE — Patient Instructions (Addendum)
Ensure/ Boost for energy Will get [place] to look at leg to rule out DVT, will call with results No heavy lifting > 10 lbs, excessive bending, pushing, pulling, or squatting for 6-8 weeks after surgery.  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 Monday 08/01/2020.   Follow up with Delight Ovens in the next month.

## 2020-07-29 ENCOUNTER — Other Ambulatory Visit (HOSPITAL_COMMUNITY)
Admission: RE | Admit: 2020-07-29 | Discharge: 2020-07-29 | Disposition: A | Discharge status: Home / Self Care | Payer: Medicare Other | Source: Ambulatory Visit | Attending: General Surgery | Admitting: General Surgery

## 2020-07-29 ENCOUNTER — Other Ambulatory Visit: Payer: Self-pay

## 2020-07-29 ENCOUNTER — Telehealth (INDEPENDENT_AMBULATORY_CARE_PROVIDER_SITE_OTHER): Payer: Medicare Other | Admitting: General Surgery

## 2020-07-29 DIAGNOSIS — I4891 Unspecified atrial fibrillation: Secondary | ICD-10-CM | POA: Insufficient documentation

## 2020-07-29 DIAGNOSIS — K4030 Unilateral inguinal hernia, with obstruction, without gangrene, not specified as recurrent: Secondary | ICD-10-CM

## 2020-07-29 DIAGNOSIS — M7989 Other specified soft tissue disorders: Secondary | ICD-10-CM

## 2020-07-29 LAB — PROTIME-INR
INR: 2.3 — ABNORMAL HIGH (ref 0.8–1.2)
Prothrombin Time: 24.1 seconds — ABNORMAL HIGH (ref 11.4–15.2)

## 2020-07-29 NOTE — Telephone Encounter (Signed)
[facility]  INR 2.3. Notified patient. Patient with some swelling but no pain and no CP or SOB. He is seeing PCP on 08/05/2020.   Told him to call with questions and concerns.  Curlene Labrum, MD [facility] [address] [place], [place] [address] [phone] (office)

## 2020-07-29 NOTE — Telephone Encounter (Signed)
Appointment made for Friday 08/05/20 at 8:05 for 30 minutes.  Patient aware

## 2020-07-31 NOTE — Progress Notes (Signed)
[facility] Note   HPI:  80 y.o. Male presents to clinic for post-op follow-up evaluation of left inguinal hernia repair. Patient reports feeling weak and not getting up and around a lot. He is 4 weeks out. He says for the last 2 weeks he has noticed left leg swelling. We held his coumadin preop after I had reduced his hernia and restarted it on the 4th day post op. He has no prior history of blood clots.   His coumadin has been subtherapeutic for a few weeks.   Review of Systems:  Swelling in the left leg and some minor soreness Weak  All other review of systems: otherwise negative   Vital Signs:  BP (!) 144/76   Pulse 79   Temp 98.4 F (36.9 C) (Other (Comment))   Resp 14   Ht 5\' 6"  (1.676 m)   Wt 171 lb (77.6 kg)   SpO2 96%   BMI 27.60 kg/m    Physical Exam:  Physical Exam Vitals reviewed.  Cardiovascular:     Rate and Rhythm: Normal rate.  Pulmonary:     Effort: Pulmonary effort is normal.  Abdominal:     General: There is no distension.     Tenderness: There is no abdominal tenderness.     Comments: Left inguinal incision healed without erythema, bruising   Musculoskeletal:     Left lower leg: Edema present.  Skin:    General: Skin is warm.  Neurological:     Mental Status: He is alert.     Laboratory studies:  Results for MCKINLEY, OLHEISER (MRN [id number]) as of 07/31/2020 11:32  Ref. Range 07/29/2020 08:48  Prothrombin Time Latest Ref Range: 11.4 - 15.2 seconds 24.1 (H)  INR Latest Ref Range: 0.8 - 1.2  2.3 (H)   Imaging:  CLINICAL DATA:  Pain and edema.  EXAM: LEFT LOWER EXTREMITY VENOUS DOPPLER ULTRASOUND  TECHNIQUE: Gray-scale sonography with graded compression, as well as color Doppler and duplex ultrasound were performed to evaluate the lower extremity deep venous systems from the level of the common femoral vein and including the common femoral, femoral, profunda femoral, popliteal and calf veins including the posterior  tibial, peroneal and gastrocnemius veins when visible. The superficial great saphenous vein was also interrogated. Spectral Doppler was utilized to evaluate flow at rest and with distal augmentation maneuvers in the common femoral, femoral and popliteal veins.  COMPARISON:  None.  FINDINGS: Contralateral Common Femoral Vein: Respiratory phasicity is normal and symmetric with the symptomatic side. No evidence of thrombus. Normal compressibility.  Common Femoral Vein: Positive for occlusive thrombus.  Saphenofemoral Junction: Positive for thrombus proximally.  Profunda Femoral Vein: Positive for occlusive thrombus.  Femoral Vein: Positive for occlusive thrombus.  Popliteal Vein: Positive for occlusive thrombus.  Calf Veins: Poorly visualized.  Venous Reflux:  None.  IMPRESSION: Positive for deep vein thrombosis involving the left common femoral vein, femoral vein, popliteal vein and profundus femoral vein.  Results were communicated to Dr. Constance Haw by the technologist at the time of imaging.   Electronically Signed   By: Margaretha Sheffield MD   On: 07/28/2020 16:02   Assessment:  80 y.o. yo Male with left leg swelling after inguinal hernia repair; I sent him immediately to the hospital for a stat US and this has shown a DVT. I called his PCP [place] and notified her of the findings. She is going to see him next week. I had the patient come in for an INR to ensure  he was therapeutic and he did this the following day and it was 2.3.    Plan:  - Gave him precautions for chest pain, SOB to call EMS -  He should elevate his leg - Continue coumadin - He is not a candidate for any thrombectomy given age and co-morbidities, and he was subtherapeutic and post op so I do not think he needs to necessarily see hematology given that this was provoked   - He sees Ms. Blanch Media next week and I appreciate her assistance in this matter - Wife and patient to call with any  questions or concerns and follow up with me PRN  -Encouraged boost/ ensure for weakness and explain the toil on his body from the surgery and now from the DVT No heavy lifting > 10 lbs, excessive bending, pushing, pulling, or squatting for 6-8 weeks after surgery.  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   Future Appointments  Date Time Provider [place]  08/02/2020  9:45 AM [facility]  08/05/2020  8:05 AM Loman Brooklyn, FNP [facility] None  09/15/2020 11:00 AM Arnoldo Lenis, MD [facility]  09/29/2020 10:15 AM Irine Seal, MD [facility] None     All of the above recommendations were discussed with the patient and patient's family, and all of patient's and family's questions were answered to their expressed satisfaction.  Curlene Labrum, MD [facility] [address] [place], [place] [address] [phone] (office)

## 2020-08-02 ENCOUNTER — Ambulatory Visit (INDEPENDENT_AMBULATORY_CARE_PROVIDER_SITE_OTHER): Payer: Medicare Other | Admitting: *Deleted

## 2020-08-02 DIAGNOSIS — I4891 Unspecified atrial fibrillation: Secondary | ICD-10-CM

## 2020-08-02 DIAGNOSIS — I482 Chronic atrial fibrillation, unspecified: Secondary | ICD-10-CM | POA: Diagnosis not present

## 2020-08-02 DIAGNOSIS — Z5181 Encounter for therapeutic drug level monitoring: Secondary | ICD-10-CM | POA: Diagnosis not present

## 2020-08-02 LAB — POCT INR: INR: 2.8 (ref 2.0–3.0)

## 2020-08-02 NOTE — Patient Instructions (Signed)
07/28/20 dx blood clot Lt calf.  No change in treatment except rest Continue warfarin 1 tablet daily  Recheck in 2 wks

## 2020-08-05 ENCOUNTER — Encounter: Payer: Self-pay | Admitting: Family Medicine

## 2020-08-05 ENCOUNTER — Other Ambulatory Visit: Payer: Self-pay

## 2020-08-05 ENCOUNTER — Ambulatory Visit (INDEPENDENT_AMBULATORY_CARE_PROVIDER_SITE_OTHER): Payer: Medicare Other | Admitting: Family Medicine

## 2020-08-05 VITALS — BP 139/75 | HR 70 | Temp 97.6°F | Ht 66.0 in | Wt 173.6 lb

## 2020-08-05 DIAGNOSIS — R911 Solitary pulmonary nodule: Secondary | ICD-10-CM

## 2020-08-05 DIAGNOSIS — I82402 Acute embolism and thrombosis of unspecified deep veins of left lower extremity: Secondary | ICD-10-CM

## 2020-08-05 DIAGNOSIS — J449 Chronic obstructive pulmonary disease, unspecified: Secondary | ICD-10-CM

## 2020-08-05 DIAGNOSIS — E782 Mixed hyperlipidemia: Secondary | ICD-10-CM | POA: Diagnosis not present

## 2020-08-05 DIAGNOSIS — N182 Chronic kidney disease, stage 2 (mild): Secondary | ICD-10-CM

## 2020-08-05 DIAGNOSIS — R2689 Other abnormalities of gait and mobility: Secondary | ICD-10-CM | POA: Diagnosis not present

## 2020-08-05 DIAGNOSIS — E559 Vitamin D deficiency, unspecified: Secondary | ICD-10-CM

## 2020-08-05 DIAGNOSIS — R7303 Prediabetes: Secondary | ICD-10-CM | POA: Diagnosis not present

## 2020-08-05 DIAGNOSIS — I9789 Other postprocedural complications and disorders of the circulatory system, not elsewhere classified: Secondary | ICD-10-CM | POA: Diagnosis not present

## 2020-08-05 DIAGNOSIS — Z8719 Personal history of other diseases of the digestive system: Secondary | ICD-10-CM | POA: Diagnosis not present

## 2020-08-05 DIAGNOSIS — I2510 Atherosclerotic heart disease of native coronary artery without angina pectoris: Secondary | ICD-10-CM | POA: Diagnosis not present

## 2020-08-05 DIAGNOSIS — I4891 Unspecified atrial fibrillation: Secondary | ICD-10-CM | POA: Diagnosis not present

## 2020-08-05 DIAGNOSIS — Z86718 Personal history of other venous thrombosis and embolism: Secondary | ICD-10-CM

## 2020-08-05 HISTORY — DX: Personal history of other venous thrombosis and embolism: Z86.718

## 2020-08-05 LAB — BAYER DCA HB A1C WAIVED: HB A1C (BAYER DCA - WAIVED): 6.3 % (ref <7.0)

## 2020-08-05 MED ORDER — VITAMIN D (ERGOCALCIFEROL) 1.25 MG (50000 UNIT) PO CAPS
50000.0000 [IU] | ORAL_CAPSULE | ORAL | 1 refills | Status: DC
Start: 2020-08-05 — End: 2020-12-23

## 2020-08-05 NOTE — Progress Notes (Signed)
Assessment & Plan:  1. 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   2. Mixed hyperlipidemia Managed by lipid clinic. Labs today to assess for improvement. - CMP14+EGFR - Lipid panel  3. Coronary artery disease involving native coronary artery of native heart without angina pectoris Unable to tolerate statins. He has been started on Repatha by the lipid clinic.  - CMP14+EGFR - Lipid panel  4. Atrial fibrillation, unspecified type [facility]) Managed by cardiology and the anticoagulation clinic. INR this week at goal.  - CBC with Differential/Platelet - CMP14+EGFR  5. Acute deep vein thrombosis (DVT) of left lower extremity after procedure (HCC) Due to subtherapeutic INR s/p hernia surgery. He is back on coumadin and is therapeutic at this time.   6. Vitamin D insufficiency Labs to assess.  - VITAMIN D 25 Hydroxy (Vit-D Deficiency, Fractures) - Vitamin D, Ergocalciferol, (DRISDOL) 1.25 MG (50000 UNIT) CAPS capsule; Take 1 capsule (50,000 Units total) by mouth every 7 (seven) days.  Dispense: 12 capsule; Refill: 1  7. Chronic kidney disease (CKD), stage II (mild) Labs to assess. - CMP14+EGFR  8. History of left inguinal hernia Recently repaired. Doing well.  9. Prediabetes Labs to assess.  - Bayer DCA Hb A1c Waived  10. Lung nodule seen on imaging study - CT Chest Wo Contrast; Future  11. Shuffling gait Recommended referral to neurology for Parkinson's Disease workup but he declined. Education provided on Parkinson's. Also declined referral to physical therapy.    Return in about 4 months (around 12/03/2020) for follow-up of chronic medication conditions.  [name], MSN, APRN, FNP-C [facility]  Subjective:    Patient ID: [name], male    DOB: April 22, 1941, 80 y.o.   MRN: [id number]  Patient Care Team: Loman Brooklyn, FNP as PCP - General (Family Medicine) Harl Bowie Alphonse Guild, MD as PCP - Cardiology  (Cardiology) Harl Bowie Alphonse Guild, MD as Consulting Physician (Cardiology) Irine Seal, MD as Attending Physician (Urology) Lavera Guise, [facility] (Pharmacist)   Chief Complaint:  Chief Complaint  Patient presents with  . COPD    Check up of chronic medical conditions     HPI: [name] is a 80 y.o. male presenting on 08/05/2020 for COPD (Check up of chronic medical conditions )  Patient is accompanied by his wife who he is okay with being present.  COPD: Patient is doing well with Breztri.  He has an albuterol inhaler to use if needed, as well as a nebulizer.  Hyperlipidemia/CAD: Patient is now going to the lipid clinic.  He is doing Repatha injections every 2 weeks which he reports he is doing well with.  He is due for his third injection in 3 days.  A-Fib: Managed by cardiology.  Patient goes to the anticoagulation clinic for INR monitoring and Coumadin adjustment.  He was seen earlier this week at which time his INR was 2.8.  Left leg DVT: Patient had to be off of his Coumadin for incarcerated hernia surgery after which he was subtherapeutic for several weeks.  During this time he developed a DVT in his left lower extremity.  His INR is back where it should be at 2.8 three days ago.  He reports he does still have some mild swelling and pain in his left leg.  Vitamin D insufficiency: Patient continues to take vitamin D supplement once weekly.  Patient had a CT of the chest in October 2021 which revealed the following: 1. Persistent left  upper lobe ground-glass opacity measuring 15 x 10 mm, not significantly changed from prior exam. An additional area of ground-glass nodularity in the left lower lobe has diminished density in the interim likely infectious or inflammatory. Recommend yearly follow-up of this left upper lobe ground-glass density for least 5 years. 2. There is new area of solid triangular nodularity in the lingula adjacent to previous atelectasis that measures 14 x 13 mm.  This is favored to represent more confluent atelectasis, however a nodule is not entirely excluded. CT follow-up is recommended, initial follow-up of this fall nodular density can be performed in 3-6 months. 3. New 4 mm nodule in the right middle lobe with suggestion of surrounding ground-glass density, nonspecific. 4. Aortic atherosclerosis.  Coronary artery calcifications. 5. Mild emphysema.   New complaints: Patient is concerned that he is still shuffling his gait and feels that he just has no energy.   Social history:  Relevant past medical, surgical, family and social history reviewed and updated as indicated. Interim medical history since our last visit reviewed.  Allergies and medications reviewed and updated.  DATA REVIEWED: CHART IN EPIC  ROS: Negative unless specifically indicated above in HPI.    Current Outpatient Medications:  .  albuterol (PROVENTIL) (2.5 MG/3ML) 0.083% nebulizer solution, Take 3 mLs (2.5 mg total) by nebulization every 6 (six) hours as needed for wheezing or shortness of breath., Disp: 72 mL, Rfl: 0 .  albuterol (VENTOLIN HFA) 108 (90 Base) MCG/ACT inhaler, Inhale 2 puffs into the lungs every 6 (six) hours as needed for wheezing or shortness of breath., Disp: 18 g, Rfl: 11 .  ARTIFICIAL TEAR OP, Place 1 drop into the left eye at bedtime., Disp: , Rfl:  .  Budeson-Glycopyrrol-Formoterol (BREZTRI AEROSPHERE) 160-9-4.8 MCG/ACT AERO, Inhale 2 puffs into the lungs in the morning and at bedtime., Disp: 42.8 g, Rfl: 12 .  Evolocumab (REPATHA SURECLICK) 756 MG/ML SOAJ, Inject 140 mg into the skin every 14 (fourteen) days., Disp: 2 mL, Rfl: 11 .  metoprolol tartrate (LOPRESSOR) 25 MG tablet, TAKE 1 AND 1/2 TABLETS TWICE DAILY ( NEW DOSE ), Disp: 270 tablet, Rfl: 3 .  traZODone (DESYREL) 50 MG tablet, Take 1 tablet (50 mg total) by mouth at bedtime. (Patient taking differently: Take 50 mg by mouth at bedtime as needed for sleep.), Disp: 90 tablet, Rfl: 2 .  warfarin  (COUMADIN) 5 MG tablet, TAKE 1 TABLET DAILY EXCEPT TAKE 1/2 TABLET ON TUESDAYS AND THURSDAYS OR AS DIRECTD (Patient taking differently: Take 2.5-5 mg by mouth See admin instructions. Take 5 mg at night on Mon, Wed, Fri, Sat, and Sun. Take 2.5 mg at night on Tues and Thurs.), Disp: 90 tablet, Rfl: 3   Allergies  Allergen Reactions  . Asa [Aspirin]     GI upset  . Niacin     hot flashes  . Pravastatin     myalgias  . Rosuvastatin     myalgias  . Simvastatin     Myalgias    Past Medical History:  Diagnosis Date  . Anxiety disorder   . Benign prostatic hypertrophy   . Bilateral cataracts   . CAD (coronary artery disease)    Catheterization 2009, mild CAD  . Carotid artery disease ([place])    Doppler, September, 2011, less than 50% bilateral  . COPD (chronic obstructive pulmonary disease) ([place])   . CVA (cerebral infarction)    Hospitalization.  MRI...    Left anterior cerebral artery territory, September, 2011... Coumadin started  . DYSLIPIDEMIA  11/01/2009   Qualifier: Diagnosis of  By: Ron Parker, MD, Caffie Damme   . Ejection fraction    EF 60%  . Ejection fraction    EF 55-60%, echo, September, 2011  . Gastroesophageal reflux disease   . Hyperlipidemia   . Persistent atrial fibrillation ([place])       . Ptosis of eyelid, left    Surgery in the past  . Sweating abnormality    Episodes of sweating appeared to be related to a delayed reaction to niacin  . Vitamin D insufficiency     Past Surgical History:  Procedure Laterality Date  . APPENDECTOMY    . CATARACT EXTRACTION, BILATERAL    . INGUINAL HERNIA REPAIR Left 07/02/2019  . INGUINAL HERNIA REPAIR Left 07/01/2020   Procedure: LEFT INGUINAL HERNIA REPAIR WITH MESH;  Surgeon: Virl Cagey, MD;  Location: [facility];  Service: General;  Laterality: Left;  . KNEE SURGERY Right 2019   torn ligament?   . TRANSURETHRAL RESECTION OF PROSTATE N/A 08/19/2018   Procedure: TRANSURETHRAL RESECTION OF THE PROSTATE (TURP) OF PROSTATE  ABCESS;  Surgeon: Alexis Frock, MD;  Location: [facility];  Service: Urology;  Laterality: N/A;    Social History   Socioeconomic History  . Marital status: Married    Spouse name: shirley  . Number of children: 5  . Years of education: 62  . Highest education level: 11th grade  Occupational History  . Occupation: Retired Statistician   Tobacco Use  . Smoking status: Former Smoker    Packs/day: 3.00    Years: 50.00    Pack years: 150.00    Types: Cigarettes    Quit date: 03/18/1990    Years since quitting: 30.4  . Smokeless tobacco: Former Systems developer    Types: [place] date: 06/18/2000  Vaping Use  . Vaping Use: Never used  Substance and Sexual Activity  . Alcohol use: No    Alcohol/week: 0.0 standard drinks  . Drug use: No  . Sexual activity: Not Currently  Other Topics Concern  . Not on file  Social History Narrative   Quit smoking in 1993. No significant alcohol abuse. Retired from trucking.    Social Determinants of Health   Financial Resource Strain: Not on file  Food Insecurity: Not on file  Transportation Needs: Not on file  Physical Activity: Not on file  Stress: Not on file  Social Connections: Not on file  Intimate Partner Violence: Not on file        Objective:    BP 139/75   Pulse 70   Temp 97.6 F (36.4 C) (Temporal)   Ht _0  (1.676 m)   Wt 173 lb 9.6 oz (78.7 kg)   SpO2 95%   BMI 28.02 kg/m   Wt Readings from Last 3 Encounters:  08/05/20 173 lb 9.6 oz (78.7 kg)  07/28/20 171 lb (77.6 kg)  07/01/20 167 lb (75.8 kg)    Physical Exam Vitals reviewed.  Constitutional:      General: He is not in acute distress.    Appearance: Normal appearance. He is overweight. He is not ill-appearing, toxic-appearing or diaphoretic.  HENT:     Head: Normocephalic and atraumatic.  Eyes:     General: No scleral icterus.       Right eye: No discharge.        Left eye: No discharge.     Conjunctiva/sclera: Conjunctivae normal.  Cardiovascular:      Rate and Rhythm: Normal  rate. Rhythm irregular.     Heart sounds: Normal heart sounds. No murmur heard. No friction rub. No gallop.   Pulmonary:     Effort: Pulmonary effort is normal. No respiratory distress.     Breath sounds: Normal breath sounds. No stridor. No wheezing, rhonchi or rales.  Musculoskeletal:        General: Normal range of motion.     Cervical back: Normal range of motion.  Skin:    General: Skin is warm and dry.  Neurological:     Mental Status: He is alert and oriented to person, place, and time. Mental status is at baseline.     Gait: Gait abnormal (shuffling).  Psychiatric:        Mood and Affect: Mood normal.        Behavior: Behavior normal.        Thought Content: Thought content normal.        Judgment: Judgment normal.     Lab Results  Component Value Date   TSH 4.360 04/07/2019   Lab Results  Component Value Date   WBC 11.1 (H) 06/29/2020   HGB 15.1 06/29/2020   HCT 48.7 06/29/2020   MCV 93.1 06/29/2020   PLT 275 06/29/2020   Lab Results  Component Value Date   NA 141 06/29/2020   K 5.0 06/29/2020   CO2 29 06/29/2020   GLUCOSE 89 06/29/2020   BUN 19 06/29/2020   CREATININE 1.13 06/29/2020   BILITOT 0.4 02/12/2020   ALKPHOS 57 02/12/2020   AST 15 02/12/2020   ALT 10 02/12/2020   PROT 6.6 02/12/2020   ALBUMIN 4.3 02/12/2020   CALCIUM 9.0 06/29/2020   ANIONGAP 8 06/29/2020   Lab Results  Component Value Date   CHOL 224 (H) 02/12/2020   Lab Results  Component Value Date   HDL 36 (L) 02/12/2020   Lab Results  Component Value Date   LDLCALC 168 (H) 02/12/2020   Lab Results  Component Value Date   TRIG 112 02/12/2020   Lab Results  Component Value Date   CHOLHDL 6.2 (H) 02/12/2020   Lab Results  Component Value Date   HGBA1C 6.2 03/20/2019

## 2020-08-05 NOTE — Patient Instructions (Signed)
Parkinson's Disease Parkinson's disease causes problems with movements. It is a long-term condition. It gets worse over time (is progressive). It affects each person in different ways. It makes it harder for you to:  Control how your body moves.  Move your body normally. The condition can range from mild to very bad (advanced). What are the causes? This condition results from a loss of brain cells called neurons. These brain cells make a chemical called dopamine, which is needed to control body movement. As the condition gets worse, the brain cells make less dopamine. This makes it hard to move or control your movements. The exact cause of this condition is not known. What increases the risk?  Being male.  Being age 60 or older.  Having family members who had Parkinson's disease.  Having had an injury to the brain.  Being very sad (depressed).  Being around things that are harmful or poisonous. What are the signs or symptoms? Symptoms of this condition can vary. The main symptoms have to do with movement. These include:  A tremor or shaking while you are resting that you cannot control.  Stiffness in your neck, arms, and legs.  Slowing of movement. This may include: ? Losing expressions of the face. ? Having trouble making small movements that are needed to button your clothing or brush your teeth.  Walking in a way that is not normal. You may walk with short, shuffling steps.  Loss of balance when standing. You may sway, fall backward, or have trouble making turns. Other symptoms include:  Being very sad, worried, or confused.  Seeing or hearing things that are not real.  Losing thinking abilities (dementia).  Trouble speaking or swallowing.  Having a hard time pooping (constipation).  Needing to pee right away, peeing often, or not being able to control when you pee or poop.  Sleep problems.   How is this treated? There is no cure. The goal of treatment is to  manage your symptoms. Treatment may include:  Medicines.  Therapy to help with talking or movement.  Surgery to reduce shaking and other movements that you cannot control. Follow these instructions at home: Medicines  Take over-the-counter and prescription medicines only as told by your doctor.  Avoid taking pain or sleeping medicines. Eating and drinking  Follow instructions from your doctor about what you cannot eat or drink.  Do not drink alcohol. Activity  Talk with your doctor about if it is safe for you to drive.  Do exercises as told by your doctor. Lifestyle  Put in grab bars and railings in your home. These help to prevent falls.  Do not use any products that contain nicotine or tobacco, such as cigarettes, e-cigarettes, and chewing tobacco. If you need help quitting, ask your doctor.  Join a support group.      General instructions  Talk with your doctor about what you need help with and what your safety needs are.  Keep all follow-up visits as told by your doctor, including any therapy visits to help with talking or moving. This is important. Contact a doctor if:  Medicines do not help your symptoms.  You feel off-balance.  You fall at home.  You need more help at home.  You have trouble swallowing.  You have a very hard time pooping.  You have a lot of side effects from your medicines.  You feel very sad, worried, or confused. Get help right away if:  You were hurt in a fall.    You see or hear things that are not real.  You cannot swallow without choking.  You have chest pain or trouble breathing.  You do not feel safe at home.  You have thoughts about hurting yourself or others. If you ever feel like you may hurt yourself or others, or have thoughts about taking your own life, get help right away. You can go to your nearest emergency department or call:  Your local emergency services (911 in the [place]  A suicide crisis helpline, such  as the [place] at 907-635-8105. This is open 24 hours a day. Summary  This condition causes problems with movements.  It is a long-term condition. It gets worse over time.  There is no cure. Treatment focuses on managing your symptoms.  Talk with your doctor about what you need help with and what your safety needs are.  Keep all follow-up visits as told by your doctor. This is important. This information is not intended to replace advice given to you by your health care provider. Make sure you discuss any questions you have with your health care provider. Document Revised: 08/21/2018 Document Reviewed: 08/21/2018 Elsevier Patient Education  [employer].

## 2020-08-06 LAB — CMP14+EGFR
ALT: 16 IU/L (ref 0–44)
AST: 21 IU/L (ref 0–40)
Albumin/Globulin Ratio: 1.5 (ref 1.2–2.2)
Albumin: 4.1 g/dL (ref 3.6–4.6)
Alkaline Phosphatase: 74 IU/L (ref 44–121)
BUN/Creatinine Ratio: 10 (ref 10–24)
BUN: 11 mg/dL (ref 8–27)
Bilirubin Total: 0.4 mg/dL (ref 0.0–1.2)
CO2: 23 mmol/L (ref 20–29)
Calcium: 9.9 mg/dL (ref 8.6–10.2)
Chloride: 104 mmol/L (ref 96–106)
Creatinine, Ser: 1.08 mg/dL (ref 0.76–1.27)
GFR calc Af Amer: 74 mL/min/{1.73_m2} (ref >59)
GFR calc non Af Amer: 64 mL/min/{1.73_m2} (ref >59)
Globulin, Total: 2.7 g/dL (ref 1.5–4.5)
Glucose: 111 mg/dL — ABNORMAL HIGH (ref 65–99)
Potassium: 4.3 mmol/L (ref 3.5–5.2)
Sodium: 144 mmol/L (ref 134–144)
Total Protein: 6.8 g/dL (ref 6.0–8.5)

## 2020-08-06 LAB — CBC WITH DIFFERENTIAL/PLATELET
Basophils Absolute: 0.1 10*3/uL (ref 0.0–0.2)
Basos: 1 %
EOS (ABSOLUTE): 0.1 10*3/uL (ref 0.0–0.4)
Eos: 1 %
Hematocrit: 45.2 % (ref 37.5–51.0)
Hemoglobin: 14.3 g/dL (ref 13.0–17.7)
Immature Grans (Abs): 0 10*3/uL (ref 0.0–0.1)
Immature Granulocytes: 0 %
Lymphocytes Absolute: 1.7 10*3/uL (ref 0.7–3.1)
Lymphs: 22 %
MCH: 27.3 pg (ref 26.6–33.0)
MCHC: 31.6 g/dL (ref 31.5–35.7)
MCV: 86 fL (ref 79–97)
Monocytes Absolute: 0.8 10*3/uL (ref 0.1–0.9)
Monocytes: 10 %
Neutrophils Absolute: 5.1 10*3/uL (ref 1.4–7.0)
Neutrophils: 66 %
Platelets: 252 10*3/uL (ref 150–450)
RBC: 5.24 x10E6/uL (ref 4.14–5.80)
RDW: 12.7 % (ref 11.6–15.4)
WBC: 7.7 10*3/uL (ref 3.4–10.8)

## 2020-08-06 LAB — VITAMIN D 25 HYDROXY (VIT D DEFICIENCY, FRACTURES): Vit D, 25-Hydroxy: 62.6 ng/mL (ref 30.0–100.0)

## 2020-08-06 LAB — LIPID PANEL
Chol/HDL Ratio: 3.6 ratio (ref 0.0–5.0)
Cholesterol, Total: 130 mg/dL (ref 100–199)
HDL: 36 mg/dL — ABNORMAL LOW (ref >39)
LDL Chol Calc (NIH): 71 mg/dL (ref 0–99)
Triglycerides: 131 mg/dL (ref 0–149)
VLDL Cholesterol Cal: 23 mg/dL (ref 5–40)

## 2020-08-16 ENCOUNTER — Ambulatory Visit (INDEPENDENT_AMBULATORY_CARE_PROVIDER_SITE_OTHER): Payer: Medicare Other | Admitting: *Deleted

## 2020-08-16 DIAGNOSIS — I4891 Unspecified atrial fibrillation: Secondary | ICD-10-CM | POA: Diagnosis not present

## 2020-08-16 DIAGNOSIS — Z5181 Encounter for therapeutic drug level monitoring: Secondary | ICD-10-CM

## 2020-08-16 DIAGNOSIS — I9789 Other postprocedural complications and disorders of the circulatory system, not elsewhere classified: Secondary | ICD-10-CM | POA: Diagnosis not present

## 2020-08-16 DIAGNOSIS — I482 Chronic atrial fibrillation, unspecified: Secondary | ICD-10-CM | POA: Diagnosis not present

## 2020-08-16 DIAGNOSIS — I82402 Acute embolism and thrombosis of unspecified deep veins of left lower extremity: Secondary | ICD-10-CM

## 2020-08-16 LAB — POCT INR: INR: 3 (ref 2.0–3.0)

## 2020-08-16 NOTE — Patient Instructions (Signed)
07/28/20 dx blood clot Lt calf.  No change in treatment except rest Continue warfarin 1 tablet daily  Recheck in 4 wks

## 2020-08-19 ENCOUNTER — Other Ambulatory Visit: Payer: Self-pay

## 2020-08-19 ENCOUNTER — Other Ambulatory Visit: Payer: Self-pay | Admitting: Family Medicine

## 2020-08-19 ENCOUNTER — Ambulatory Visit (HOSPITAL_COMMUNITY)
Admission: RE | Admit: 2020-08-19 | Discharge: 2020-08-19 | Disposition: A | Discharge status: Home / Self Care | Payer: Medicare Other | Source: Ambulatory Visit | Attending: Family Medicine | Admitting: Family Medicine

## 2020-08-19 DIAGNOSIS — R911 Solitary pulmonary nodule: Secondary | ICD-10-CM | POA: Insufficient documentation

## 2020-08-19 DIAGNOSIS — J984 Other disorders of lung: Secondary | ICD-10-CM | POA: Diagnosis not present

## 2020-08-19 DIAGNOSIS — I2510 Atherosclerotic heart disease of native coronary artery without angina pectoris: Secondary | ICD-10-CM | POA: Diagnosis not present

## 2020-08-19 DIAGNOSIS — J432 Centrilobular emphysema: Secondary | ICD-10-CM | POA: Diagnosis not present

## 2020-08-19 DIAGNOSIS — M19012 Primary osteoarthritis, left shoulder: Secondary | ICD-10-CM | POA: Diagnosis not present

## 2020-08-19 NOTE — Telephone Encounter (Signed)
I do not see on medication list please review

## 2020-08-22 ENCOUNTER — Encounter: Payer: Self-pay | Admitting: Family Medicine

## 2020-08-24 ENCOUNTER — Other Ambulatory Visit: Payer: Self-pay | Admitting: Cardiology

## 2020-09-02 ENCOUNTER — Encounter: Payer: Self-pay | Admitting: Nurse Practitioner

## 2020-09-02 ENCOUNTER — Ambulatory Visit (INDEPENDENT_AMBULATORY_CARE_PROVIDER_SITE_OTHER): Payer: Medicare Other | Admitting: Nurse Practitioner

## 2020-09-02 ENCOUNTER — Other Ambulatory Visit: Payer: Self-pay

## 2020-09-02 VITALS — BP 134/76 | HR 77 | Temp 97.8°F | Resp 20 | Ht 66.0 in | Wt 175.0 lb

## 2020-09-02 DIAGNOSIS — I82A12 Acute embolism and thrombosis of left axillary vein: Secondary | ICD-10-CM

## 2020-09-02 DIAGNOSIS — I2510 Atherosclerotic heart disease of native coronary artery without angina pectoris: Secondary | ICD-10-CM | POA: Diagnosis not present

## 2020-09-02 NOTE — Progress Notes (Signed)
   Subjective:    Patient ID: [name], male    DOB: November 21, 1940, 80 y.o.   MRN: [id number]   Chief Complaint: Left leg swelling (Swelling since hernia surgery in January)   HPI Patient come sin c/o left leg swelling. He saw B. Blanch Media 07/28/20 and was dx with DVT left leg. Patient is on coumadin. His concern is that his leg is still swollen.   Review of Systems  Constitutional: Negative for diaphoresis.  Eyes: Negative for pain.  Respiratory: Negative for shortness of breath.   Cardiovascular: Positive for leg swelling. Negative for chest pain and palpitations.  Gastrointestinal: Negative for abdominal pain.  Endocrine: Negative for polydipsia.  Skin: Negative for rash.  Neurological: Negative for dizziness, weakness and headaches.  Hematological: Does not bruise/bleed easily.  All other systems reviewed and are negative.      Objective:   Physical Exam Vitals and nursing note reviewed.  Constitutional:      Appearance: Normal appearance.  Cardiovascular:     Rate and Rhythm: Normal rate and regular rhythm.     Heart sounds: Normal heart sounds.  Pulmonary:     Effort: Pulmonary effort is normal.     Breath sounds: Normal breath sounds.  Musculoskeletal:     Left lower leg: Edema (1-2+ edema) present.  Skin:    General: Skin is warm.  Neurological:     General: No focal deficit present.     Mental Status: He is alert.  Psychiatric:        Mood and Affect: Mood normal.        Behavior: Behavior normal.    BP 134/76   Pulse 77   Temp 97.8 F (36.6 C) (Temporal)   Resp 20   Ht 5\' 6"  (1.676 m)   Wt 175 lb (79.4 kg)   SpO2 93%   BMI 28.25 kg/m         Assessment & Plan:  [name] in today with chief complaint of Left leg swelling (Swelling since hernia surgery in January)   1. DVT of axillary vein, acute left (HCC) Continue coumadin as rx Talk with DR. Branch about eliquis or xeralto if we can get it for free.  Elevate leg Ice may help   May take months for swelling to go down.    The above assessment and management plan was discussed with the patient. The patient verbalized understanding of and has agreed to the management plan. Patient is aware to call the clinic if symptoms persist or worsen. Patient is aware when to return to the clinic for a follow-up visit. Patient educated on when it is appropriate to go to the emergency department.   Mary-Margaret Hassell Done, FNP

## 2020-09-02 NOTE — Patient Instructions (Signed)
Deep Vein Thrombosis  Deep vein thrombosis (DVT) is a condition in which a blood clot forms in a deep vein, such as a vein in the lower leg, thigh, pelvis, or arm. Deep veins are veins in the deep venous system. A clot is blood that has thickened into a gel or solid. This condition is serious and can be life-threatening if the clot travels to the lungs and causes a blockage (pulmonary embolism) in the arteries of the lung. A DVT can also damage veins in the leg. This can lead to long-term, or chronic, venous disease, leg pain, swelling, discoloration, and ulcers or sores (post-thrombotic syndrome). What are the causes? This condition may be caused by:  A slowdown of blood flow.  Damage to a vein.  A condition that causes blood to clot more easily, such as certain blood-clotting disorders. What increases the risk? The following factors may make you more likely to develop this condition:  Having obesity.  Being older, especially older than age 60.  Being inactive (sedentary lifestyle) or not moving around. This may include: ? Sitting or lying down for longer than 4-6 hours other than to sleep at night. ? Being in the hospital, having major or lengthy surgery, or having a thin, flexible tube (central line catheter) placed in a large vein.  Being pregnant, giving birth, or having recently given birth.  Taking medicines that contain estrogen, such as birth control or hormone replacement therapy.  Using products that contain nicotine or tobacco, especially if you use hormonal birth control.  Having a history of blood clots or a blood-clotting disease, a blood vessel disease (peripheral vascular disease), or congestive heart disease.  Having a history of cancer, especially if being treated with chemotherapy. What are the signs or symptoms? Symptoms of this condition include:  Swelling, pain, pressure, or tenderness in an arm or a leg.  An arm or a leg becoming warm, red, or  discolored.  A leg turning very pale. You may have a large DVT. This is rare. If the clot is in your leg, you may notice symptoms more or have worse symptoms when you stand or walk. In some cases, there are no symptoms. How is this diagnosed? This condition is diagnosed with:  Your medical history and a physical exam.  Tests, such as: ? Blood tests to check how well your blood clots. ? Doppler ultrasound. This is the best way to find a DVT. ? Venogram. Contrast dye is injected into a vein, and X-rays are taken to check for clots. How is this treated? Treatment for this condition depends on:  The cause of your DVT.  The size and location of your DVT, or having more than one DVT.  Your risk for bleeding or developing more clots.  Other medical conditions you may have. Treatment may include:  Taking a blood thinner, also called an anticoagulant, to prevent clots from forming and growing.  Wearing compression stockings, if directed.  Injecting medicines into the affected vein to break up the clot (catheter-directed thrombolysis). This is used only for severe DVT and only if a specialist recommends it.  Specific surgical procedures, when DVT is severe or hard to treat. These may be done to: ? Isolate and remove your clot. ? Place an inferior vena cava (IVC) filter in a large vein to catch blood clots before they reach your lungs. You may get some medical treatments for 6 months or longer. Follow these instructions at home: If you are taking blood thinners:    Talk with your health care provider before you take any medicines that contain aspirin or NSAIDs, such as ibuprofen. These medicines increase your risk for dangerous bleeding.  Take your medicine exactly as told, at the same time every day. Do not skip a dose. Do not take more than the prescribed dose. This is important.  Ask your health care provider about foods and medicines that could change the way your blood thinner  works (may interact). Avoid these foods and medicines if you are told to do so.  Avoid anything that may cause bleeding or bruising. You may bleed more easily while taking blood thinners. ? Be very careful when using knives, scissors, or other sharp objects. ? Use an electric razor instead of a blade. ? Avoid activities that could cause injury or bruising, and follow instructions for preventing falls. ? Tell your health care provider if you have had any internal bleeding, bleeding ulcers, or neurologic diseases, such as strokes or cerebral aneurysms.  Wear a medical alert bracelet or carry a card that lists what medicines you take. General instructions  Take over-the-counter and prescription medicines only as told by your health care provider.  Return to your normal activities as told by your health care provider. Ask your health care provider what activities are safe for you.  If recommended, wear compression stockings as told by your health care provider. These stockings help to prevent blood clots and reduce swelling in your legs.  Keep all follow-up visits as told by your health care provider. This is important. Contact a health care provider if:  You miss a dose of your blood thinner.  You have new or worse pain, swelling, or redness in an arm or a leg.  You have worsening numbness or tingling in an arm or a leg.  You have unusual bruising. Get help right away if:  You have signs or symptoms that a blood clot has moved to the lungs. These may include: ? Shortness of breath. ? Chest pain. ? Fast or irregular heartbeats (palpitations). ? Light-headedness or dizziness. ? Coughing up blood.  You have signs or symptoms that your blood is too thin. These may include: ? Blood in your vomit, stool, or urine. ? A cut that will not stop bleeding. ? A menstrual period that is heavier than usual. ? A severe headache or confusion. These symptoms may represent a serious problem that  is an emergency. Do not wait to see if the symptoms will go away. Get medical help right away. Call your local emergency services (911 in the [place] Do not drive yourself to the hospital. Summary  Deep vein thrombosis (DVT) happens when a blood clot forms in a deep vein. This may occur in the lower leg, thigh, pelvis, or arm.  Symptoms affect the arm or leg and can include swelling, pain, tenderness, warmth, redness, or discoloration.  This condition may be treated with medicines or compression stockings. In severe cases, surgery may be done.  If you are taking blood thinners, take them exactly as told. Do not skip a dose. Do not take more than is prescribed.  Get help right away if you have shortness of breath, chest pain, fast or irregular heartbeats, or blood in your vomit, urine, or stool. This information is not intended to replace advice given to you by your health care provider. Make sure you discuss any questions you have with your health care provider. Document Revised: 05/30/2019 Document Reviewed: 05/30/2019 Elsevier Patient Education  2021 Elsevier   Inc.  

## 2020-09-15 ENCOUNTER — Encounter: Payer: Self-pay | Admitting: Cardiology

## 2020-09-15 ENCOUNTER — Ambulatory Visit (INDEPENDENT_AMBULATORY_CARE_PROVIDER_SITE_OTHER): Payer: Medicare Other | Admitting: *Deleted

## 2020-09-15 ENCOUNTER — Ambulatory Visit (INDEPENDENT_AMBULATORY_CARE_PROVIDER_SITE_OTHER): Payer: Medicare Other | Admitting: Cardiology

## 2020-09-15 ENCOUNTER — Other Ambulatory Visit: Payer: Self-pay

## 2020-09-15 VITALS — BP 120/74 | HR 72 | Ht 66.0 in | Wt 176.4 lb

## 2020-09-15 DIAGNOSIS — Z5181 Encounter for therapeutic drug level monitoring: Secondary | ICD-10-CM

## 2020-09-15 DIAGNOSIS — I4891 Unspecified atrial fibrillation: Secondary | ICD-10-CM

## 2020-09-15 DIAGNOSIS — I6523 Occlusion and stenosis of bilateral carotid arteries: Secondary | ICD-10-CM | POA: Diagnosis not present

## 2020-09-15 DIAGNOSIS — I351 Nonrheumatic aortic (valve) insufficiency: Secondary | ICD-10-CM

## 2020-09-15 DIAGNOSIS — I482 Chronic atrial fibrillation, unspecified: Secondary | ICD-10-CM

## 2020-09-15 DIAGNOSIS — I2510 Atherosclerotic heart disease of native coronary artery without angina pectoris: Secondary | ICD-10-CM | POA: Diagnosis not present

## 2020-09-15 LAB — POCT INR: INR: 2.5 (ref 2.0–3.0)

## 2020-09-15 NOTE — Progress Notes (Signed)
Clinical Summary [name] is a 80 y.o.male  seen today for follow up of the following medical problems.   1. Chronic afib - DOACs were too expensive in the past, he has elected to remain on coumadin. - recent admission with a febrile illness, rigors at [facility]. Caused his afib rates to elevate in the hospital, lopressor was increased to 100mg  bid. Took a few days after discharge, but starting causing significant fatigue. - he lowered lopressor back to 50mg  bid. Symptoms have improved.  - seen by EP, did not meet criteria to consider watchman device  - no recent palpitaitons - compl,iant with meds   2. Carotid stenosis - 02/2015 carotid US: bilateral 1-39% ICA disease. - no recent symptoms  3. Hyperlipidemia - joint pains on simvaststin, atorvastatin, rosuvastatin, pravastatin. 01/2020 TC 224 TG 112 HDL 36 LDL 168  - he is on repatha, followed lipid clinic - LDL down from 168 to 71   4. CAD - mild disease by cath 2009 - no recent chest pain  5. LE edema -reports singificant bilateral leg edema over the last few weeks. Denies any SOB or orthopnea. Weight is up 6 lbs since 04/2018. - Jan 2020 LVEF 08-65%,HQIONG eval diastolic function due to afib but sever biatrial enlargements is suggestive.    6. 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 Vertigo - followed by neurology  8. DVT  - 07/2020 [place] with deep vein thrombosis involving the left common femoral vein, femoral vein, popliteal vein and profundus femoral vein. - occurred after left inguinal hernia repair. Note coumadin was held in periop period, prior to that his INR had been subtherapeutic.   -if needs to be off in the future would consider bridging.  - he reports pcp working on [place] assistance. 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 Often goes to [employer]   Past Medical History:   Diagnosis Date  . Anxiety disorder   . Benign prostatic hypertrophy   . Bilateral cataracts   . CAD (coronary artery disease)    Catheterization 2009, mild CAD  . Carotid artery disease ([place])    Doppler, September, 2011, less than 50% bilateral  . COPD (chronic obstructive pulmonary disease) ([place])   . CVA (cerebral infarction)    Hospitalization.  MRI...    Left anterior cerebral artery territory, September, 2011... Coumadin started  . DYSLIPIDEMIA 11/01/2009   Qualifier: Diagnosis of  By: Ron Parker, MD, Leonidas Romberg [name]   . Ejection fraction    EF 60%  . Ejection fraction    EF 55-60%, echo, September, 2011  . Gastroesophageal reflux disease   . Hyperlipidemia   . Persistent atrial fibrillation ([place])       . Ptosis of eyelid, left    Surgery in the past  . Sweating abnormality    Episodes of sweating appeared to be related to a delayed reaction to niacin  . Vitamin D insufficiency      Allergies  Allergen Reactions  . Asa [Aspirin]     GI upset  . Niacin     hot flashes  . Pravastatin     myalgias  . Rosuvastatin     myalgias  . Simvastatin     Myalgias      Current Outpatient Medications  Medication Sig Dispense Refill  . albuterol (PROVENTIL) (2.5 MG/3ML) 0.083% nebulizer solution Take 3 mLs (2.5 mg total)  by nebulization every 6 (six) hours as needed for wheezing or shortness of breath. 72 mL 0  . albuterol (VENTOLIN HFA) 108 (90 Base) MCG/ACT inhaler Inhale 2 puffs into the lungs every 6 (six) hours as needed for wheezing or shortness of breath. 18 g 11  . ARTIFICIAL TEAR OP Place 1 drop into the left eye at bedtime.    . Budeson-Glycopyrrol-Formoterol (BREZTRI AEROSPHERE) 160-9-4.8 MCG/ACT AERO Inhale 2 puffs into the lungs in the morning and at bedtime. 42.8 g 12  . Evolocumab (REPATHA SURECLICK) 102 MG/ML SOAJ Inject 140 mg into the skin every 14 (fourteen) days. 2 mL 11  . metoprolol tartrate (LOPRESSOR) 25 MG tablet TAKE 1 AND 1/2 TABLETS TWICE DAILY ( NEW  DOSE ) 270 tablet 3  . omeprazole (PRILOSEC) 20 MG capsule TAKE 1 CAPSULE EVERY DAY 90 capsule 1  . traZODone (DESYREL) 50 MG tablet Take 1 tablet (50 mg total) by mouth at bedtime. (Patient taking differently: Take 50 mg by mouth at bedtime as needed for sleep.) 90 tablet 2  . Vitamin D, Ergocalciferol, (DRISDOL) 1.25 MG (50000 UNIT) CAPS capsule Take 1 capsule (50,000 Units total) by mouth every 7 (seven) days. 12 capsule 1  . warfarin (COUMADIN) 5 MG tablet TAKE 1 TABLET DAILY OR AS DIRECTED 90 tablet 3   No current facility-administered medications for this visit.     Past Surgical History:  Procedure Laterality Date  . APPENDECTOMY    . CATARACT EXTRACTION, BILATERAL    . INGUINAL HERNIA REPAIR Left 07/02/2019  . INGUINAL HERNIA REPAIR Left 07/01/2020   Procedure: LEFT INGUINAL HERNIA REPAIR WITH MESH;  Surgeon: Virl Cagey, MD;  Location: [facility];  Service: General;  Laterality: Left;  . KNEE SURGERY Right 2019   torn ligament?   . TRANSURETHRAL RESECTION OF PROSTATE N/A 08/19/2018   Procedure: TRANSURETHRAL RESECTION OF THE PROSTATE (TURP) OF PROSTATE ABCESS;  Surgeon: Alexis Frock, MD;  Location: [facility];  Service: Urology;  Laterality: N/A;     Allergies  Allergen Reactions  . Asa [Aspirin]     GI upset  . Niacin     hot flashes  . Pravastatin     myalgias  . Rosuvastatin     myalgias  . Simvastatin     Myalgias       Family History  Problem Relation Age of Onset  . Heart attack Mother   . Other Other        Insignificant for premature CAD  . Heart disease Sister   . Hyperlipidemia Sister   . Kidney disease Son   . Heart attack Paternal Grandfather      Social History Mr. Hamblin reports that he quit smoking about 30 years ago. His smoking use included cigarettes. He has a 150.00 pack-year smoking history. He quit smokeless tobacco use about 20 years ago.  His smokeless tobacco use included chew. Mr. Savarese reports no history of alcohol  use.   Review of Systems CONSTITUTIONAL: No weight loss, fever, chills, weakness or fatigue.  HEENT: Eyes: No visual loss, blurred vision, double vision or yellow sclerae.No hearing loss, sneezing, congestion, runny nose or sore throat.  SKIN: No rash or itching.  CARDIOVASCULAR: per hpi RESPIRATORY: No shortness of breath, cough or sputum.  GASTROINTESTINAL: No anorexia, nausea, vomiting or diarrhea. No abdominal pain or blood.  GENITOURINARY: No burning on urination, no polyuria NEUROLOGICAL: No headache, dizziness, syncope, paralysis, ataxia, numbness or tingling in the extremities. No change in bowel or bladder control.  MUSCULOSKELETAL: No muscle, back pain, joint pain or stiffness.  LYMPHATICS: No enlarged nodes. No history of splenectomy.  PSYCHIATRIC: No history of depression or anxiety.  ENDOCRINOLOGIC: No reports of sweating, cold or heat intolerance. No polyuria or polydipsia.  Marland Kitchen   Physical Examination Today's Vitals   09/15/20 1058  BP: 120/74  Pulse: 72  SpO2: 97%  Weight: 176 lb 6.4 oz (80 kg)  Height: 5\' 6"  (1.676 m)   Body mass index is 28.47 kg/m.  Gen: resting comfortably, no acute distress HEENT: no scleral icterus, pupils equal round and reactive, no palptable cervical adenopathy,  FF:MBWGY, no m/r/g, no jvd Resp: Clear to auscultation bilaterally GI: abdomen is soft, non-tender, non-distended, normal bowel sounds, no hepatosplenomegaly MSK: extremities are warm, no edema.  Skin: warm, no rash Neuro:  no focal deficits Psych: appropriate affect   Diagnostic Studies  02/2015 carotid US: bilateral 1-39% ICA disease. Recs for f/u 2 years  02/2008 cath FINDINGS: The aortic pressure 98/71 with a mean of 83, left ventricular pressure 99/20.  Coronary angiography. The left mainstem is very short. There is no stenosis present.  The LAD is a large-caliber vessel that courses down and reaches the LV apex. The anatomy is a bit unusual as there  is a Zane Pellecchia off the proximal LAD that runs the course of an intermediate. It supplies multiple Dorla Guizar vessels to the lateral wall. There has been more of a traditional appearing diagonal Zannah Melucci from further down in the mid-LAD. The LAD at the origin of the second diagonal Naythen Heikkila has a 30% stenosis. Further down in the vessel, there is minor nonobstructive plaque but no significant stenosis in the LAD or its Cristabel Bicknell vessels. There are two diagonals in the intermediate territory Aeon Koors but all arising from the proximal and mid LAD.  The left circumflex is very small. It courses down the AV groove and supplies no significant Uilani Sanville vessels.  Right coronary artery. The right coronary artery is dominant. Proximally, there is an eccentric 50% stenosis. This is a smooth appearing lesion of the vessels. The midportion of the right coronary artery is dilated. There is a transition zone into more normal- appearing vessel where there is a 30% stenosis. There is a large acute marginal Heydy Montilla and a moderate size PDA and posterolateral Jrue Jarriel. There are no other significant stenoses noted. With intracoronary nitroglycerin, the lesion still appears no worse than 50%.  Left ventriculography shows low normal LV function with an LVEF in the range of 50-55%. There was no significant mitral regurgitation.  ASSESSMENT: 1. Mild-to-moderate right coronary artery stenosis as outlined above. 2. Minimal left anterior descending and left circumflex stenosis. 3. Normal left ventricular function.  Post cat PLAN: Recommend medical therapy for nonobstructive CAD. The patient will resume Coumadin tonight. He will drop his aspirin dose to 81 mg. In reviewing Dr. Ron Parker notes, the patient may not require long-term Coumadin. Recommend aggressive secondary risk reduction for the patient's diffuse nonobstructive CAD.   08/2016 event monitor  Telemetry tracings show  rate controlled atrial fibrillation  No symptoms reported    Assessment and Plan  1. Chronic afib - DOACs have been too expensive, he prefers staying on coumadin - reports pcp working on [place] assistance, for now remains on coumadin - no symptoms, continue current meds  2. CAD - mild disease on prior cath - no symptoms, continue to monitor  3. Carotid stenosis - repeat carotid US  4. Moderate aortic regurgitation - repeat echo  5. HYperlipidemia -intolerant to multiple  statins - on repatha, followed in lipid clinic, LDL overall at goal.   6 DVT - on coumadin currerntly. He reports pcp working on possibly NOAC assistance    Arnoldo Lenis, M.D.

## 2020-09-15 NOTE — Patient Instructions (Signed)
Your physician recommends that you schedule a follow-up appointment in: 6 MONTHS WITH DR BRANCH  Your physician recommends that you continue on your current medications as directed. Please refer to the Current Medication list given to you today.  Your physician has requested that you have an echocardiogram. Echocardiography is a painless test that uses sound waves to create images of your heart. It provides your doctor with information about the size and shape of your heart and how well your heart's chambers and valves are working. This procedure takes approximately one hour. There are no restrictions for this procedure.  Your physician has requested that you have a carotid duplex. This test is an ultrasound of the carotid arteries in your neck. It looks at blood flow through these arteries that supply the brain with blood. Allow one hour for this exam. There are no restrictions or special instructions.  Thank you for choosing [facility]!!    

## 2020-09-15 NOTE — Patient Instructions (Signed)
07/28/20 dx blood clot Lt calf.  Continue warfarin 1 tablet daily  Recheck in 4 wks

## 2020-09-23 ENCOUNTER — Other Ambulatory Visit: Payer: Self-pay | Admitting: Family Medicine

## 2020-09-29 ENCOUNTER — Encounter: Payer: Self-pay | Admitting: Urology

## 2020-09-29 ENCOUNTER — Ambulatory Visit (INDEPENDENT_AMBULATORY_CARE_PROVIDER_SITE_OTHER): Payer: Medicare Other | Admitting: Urology

## 2020-09-29 ENCOUNTER — Other Ambulatory Visit: Payer: Self-pay

## 2020-09-29 VITALS — BP 168/80 | HR 53 | Temp 97.5°F

## 2020-09-29 DIAGNOSIS — N3941 Urge incontinence: Secondary | ICD-10-CM | POA: Diagnosis not present

## 2020-09-29 DIAGNOSIS — N401 Enlarged prostate with lower urinary tract symptoms: Secondary | ICD-10-CM

## 2020-09-29 DIAGNOSIS — R3915 Urgency of urination: Secondary | ICD-10-CM

## 2020-09-29 DIAGNOSIS — I6523 Occlusion and stenosis of bilateral carotid arteries: Secondary | ICD-10-CM | POA: Diagnosis not present

## 2020-09-29 LAB — URINALYSIS, ROUTINE W REFLEX MICROSCOPIC
Bilirubin, UA: NEGATIVE
Glucose, UA: NEGATIVE
Ketones, UA: NEGATIVE
Leukocytes,UA: NEGATIVE
Nitrite, UA: NEGATIVE
Protein,UA: NEGATIVE
RBC, UA: NEGATIVE
Specific Gravity, UA: 1.025 (ref 1.005–1.030)
Urobilinogen, Ur: 0.2 mg/dL (ref 0.2–1.0)
pH, UA: 5 (ref 5.0–7.5)

## 2020-09-29 NOTE — Progress Notes (Signed)
Urological Symptom Review  Patient is experiencing the following symptoms: Frequent urination Hard to postpone urination Get up at night to urinate Leakage of urine   Review of Systems  Gastrointestinal (upper)  : negative  Gastrointestinal (lower) : Negative for lower GI symptoms  Constitutional : Negative for symptoms  Skin: Negative for skin symptoms  Eyes: Negative for eye symptoms  Ear/Nose/Throat : Negative for Ear/Nose/Throat symptoms  Hematologic/Lymphatic: Negative for Hematologic/Lymphatic symptoms  Cardiovascular : Leg swelling  Respiratory : Negative for respiratory symptoms  Endocrine: Negative for endocrine symptoms  Musculoskeletal: Negative for musculoskeletal symptoms  Neurological: Dizziness  Psychologic: Negative for psychiatric symptoms

## 2020-09-29 NOTE — Progress Notes (Signed)
Subjective:  1. Benign localized prostatic hyperplasia with lower urinary tract symptoms (LUTS)   2. Urinary urgency   3. Urge incontinence      I have an enlarged prostate (follow-up). HPI: [name] is a 80 year-old male established patient who is here for an enlarged prostate follow-up evaluation.  09/29/20: [name] returns today in f/u.  He has a history of TURP for a prostatic abscess in 3/20.  He continues to void well and his UA is clear.  His IPSS is 4 with nocturia x 1.  He has some urgency with occasional UUI.  He has had no gross hematuria.   He had a hernia repair on 1/12 and got a post op DVT after hold warfarin.    09/25/19: [name] was taken off of finasteride at his last visit and has also stopped the tamsulosin.  He is voiding well with an IPSS of 5.  His only complaint is dizziness.  He has held his cholesterol med since it lists dizziness as a side effects.  He has had no hematuria or dysuria.   .     05/01/19: [name] has had some urgency with near UUI since the TURP for a prostatic abscess on 08/19/18: He has had a couple of episodes of blood spotting in the shorts. He has been having issues with vertigo and wakes up every day quite stiff. His UA today has 1+ blood and his IPSS is 15. He is off of tamsulosin but remains on finasteride. He is on warfarin and his INR was 3.2 at last check so his dose was reduced.   11/12/2018: He is doing well since last visit on flomax and finasteride.       IPSS    Row Name 09/29/20 1000         International Prostate Symptom Score   How often have you had the sensation of not emptying your bladder? Less than 1 in 5     How often have you had to urinate less than every two hours? Less than 1 in 5 times     How often have you found you stopped and started again several times when you urinated? Not at All     How often have you found it difficult to postpone urination? Less than 1 in 5 times     How often have you had a  weak urinary stream? Not at All     How often have you had to strain to start urination? Not at All     How many times did you typically get up at night to urinate? 1 Time     Total IPSS Score 4           Quality of Life due to urinary symptoms   If you were to spend the rest of your life with your urinary condition just the way it is now how would you feel about that? Pleased             ROS:  ROS:  A complete review of systems was performed.  All systems are negative except for pertinent findings as noted.   ROS  Allergies  Allergen Reactions  . Asa [Aspirin]     GI upset  . Niacin     hot flashes  . Pravastatin     myalgias  . Rosuvastatin     myalgias  . Simvastatin     Myalgias     Outpatient Encounter Medications as of  09/29/2020  Medication Sig  . albuterol (PROVENTIL) (2.5 MG/3ML) 0.083% nebulizer solution Take 3 mLs (2.5 mg total) by nebulization every 6 (six) hours as needed for wheezing or shortness of breath.  Marland Kitchen albuterol (VENTOLIN HFA) 108 (90 Base) MCG/ACT inhaler Inhale 2 puffs into the lungs every 6 (six) hours as needed for wheezing or shortness of breath.  . ARTIFICIAL TEAR OP Place 1 drop into the left eye at bedtime.  . Budeson-Glycopyrrol-Formoterol (BREZTRI AEROSPHERE) 160-9-4.8 MCG/ACT AERO Inhale 2 puffs into the lungs in the morning and at bedtime.  . Evolocumab (REPATHA SURECLICK) 258 MG/ML SOAJ Inject 140 mg into the skin every 14 (fourteen) days.  . metoprolol tartrate (LOPRESSOR) 25 MG tablet TAKE 1 AND 1/2 TABLETS TWICE DAILY ( NEW DOSE )  . omeprazole (PRILOSEC) 20 MG capsule TAKE 1 CAPSULE EVERY DAY  . traZODone (DESYREL) 50 MG tablet Take 1 tablet (50 mg total) by mouth at bedtime. (Patient taking differently: Take 50 mg by mouth at bedtime as needed for sleep.)  . Vitamin D, Ergocalciferol, (DRISDOL) 1.25 MG (50000 UNIT) CAPS capsule Take 1 capsule (50,000 Units total) by mouth every 7 (seven) days.  Marland Kitchen warfarin (COUMADIN) 5 MG tablet TAKE  1 TABLET DAILY OR AS DIRECTED   No facility-administered encounter medications on file as of 09/29/2020.    Past Medical History:  Diagnosis Date  . Anxiety disorder   . Benign prostatic hypertrophy   . Bilateral cataracts   . CAD (coronary artery disease)    Catheterization 2009, mild CAD  . Carotid artery disease ([place])    Doppler, September, 2011, less than 50% bilateral  . COPD (chronic obstructive pulmonary disease) ([place])   . CVA (cerebral infarction)    Hospitalization.  MRI...    Left anterior cerebral artery territory, September, 2011... Coumadin started  . DYSLIPIDEMIA 11/01/2009   Qualifier: Diagnosis of  By: Ron Parker, MD, Leonidas Romberg Dorinda Hill   . Ejection fraction    EF 60%  . Ejection fraction    EF 55-60%, echo, September, 2011  . Gastroesophageal reflux disease   . Hyperlipidemia   . Persistent atrial fibrillation ([place])       . Ptosis of eyelid, left    Surgery in the past  . Sweating abnormality    Episodes of sweating appeared to be related to a delayed reaction to niacin  . Vitamin D insufficiency     Past Surgical History:  Procedure Laterality Date  . APPENDECTOMY    . CATARACT EXTRACTION, BILATERAL    . INGUINAL HERNIA REPAIR Left 07/02/2019  . INGUINAL HERNIA REPAIR Left 07/01/2020   Procedure: LEFT INGUINAL HERNIA REPAIR WITH MESH;  Surgeon: Virl Cagey, MD;  Location: [facility];  Service: General;  Laterality: Left;  . KNEE SURGERY Right 2019   torn ligament?   . TRANSURETHRAL RESECTION OF PROSTATE N/A 08/19/2018   Procedure: TRANSURETHRAL RESECTION OF THE PROSTATE (TURP) OF PROSTATE ABCESS;  Surgeon: Alexis Frock, MD;  Location: [facility];  Service: Urology;  Laterality: N/A;    Social History   Socioeconomic History  . Marital status: Married    Spouse name: shirley  . Number of children: 5  . Years of education: 19  . Highest education level: 11th grade  Occupational History  . Occupation: Retired Statistician   Tobacco Use  . Smoking  status: Former Smoker    Packs/day: 3.00    Years: 50.00    Pack years: 150.00    Types: Cigarettes    Quit  date: 03/18/1990    Years since quitting: 30.5  . Smokeless tobacco: Former Systems developer    Types: Drew date: 06/18/2000  Vaping Use  . Vaping Use: Never used  Substance and Sexual Activity  . Alcohol use: No    Alcohol/week: 0.0 standard drinks  . Drug use: No  . Sexual activity: Not Currently  Other Topics Concern  . Not on file  Social History Narrative   Quit smoking in 1993. No significant alcohol abuse. Retired from trucking.    Social Determinants of Health   Financial Resource Strain: Not on file  Food Insecurity: Not on file  Transportation Needs: Not on file  Physical Activity: Not on file  Stress: Not on file  Social Connections: Not on file  Intimate Partner Violence: Not on file    Family History  Problem Relation Age of Onset  . Heart attack Mother   . Other Other        Insignificant for premature CAD  . Heart disease Sister   . Hyperlipidemia Sister   . Kidney disease Son   . Heart attack Paternal Grandfather        Objective: Vitals:   09/29/20 1025  BP: (!) 168/80  Pulse: (!) 53  Temp: (!) 97.5 F (36.4 C)     Physical Exam  Lab Results:  Results for orders placed or performed in visit on 09/29/20 (from the past 24 hour(s))  Urinalysis, Routine w reflex microscopic     Status: None   Collection Time: 09/29/20 10:23 AM  Result Value Ref Range   Specific Gravity, UA 1.025 1.005 - 1.030   pH, UA 5.0 5.0 - 7.5   Color, UA Yellow Yellow   Appearance Ur Clear Clear   Leukocytes,UA Negative Negative   Protein,UA Negative Negative/Trace   Glucose, UA Negative Negative   Ketones, UA Negative Negative   RBC, UA Negative Negative   Bilirubin, UA Negative Negative   Urobilinogen, Ur 0.2 0.2 - 1.0 mg/dL   Nitrite, UA Negative Negative   Microscopic Examination Comment    Narrative   Performed at:  [facility] [address] [place], [place]  [id number] Lab Director: [place], Phone:  [phone]    BMET No results for input(s): NA, K, CL, CO2, GLUCOSE, BUN, CREATININE, CALCIUM in the last 72 hours. PSA No results found for: PSA Testosterone  Date Value Ref Range Status  02/12/2020 302 264 - 916 ng/dL Final    Comment:    Adult male reference interval is based on a population of healthy nonobese males (BMI <30) between 70 and 69 years old. [place], [place] [phone]. PMID: [id number].    Recent Results (from the past 2160 hour(s))  POCT INR     Status: Abnormal   Collection Time: 07/11/20 10:15 AM  Result Value Ref Range   INR 1.3 (A) 2.0 - 3.0  POCT INR     Status: Abnormal   Collection Time: 07/19/20  9:47 AM  Result Value Ref Range   INR 1.8 (A) 2.0 - 3.0  Protime-INR     Status: Abnormal   Collection Time: 07/29/20  8:48 AM  Result Value Ref Range   Prothrombin Time 24.1 (H) 11.4 - 15.2 seconds   INR 2.3 (H) 0.8 - 1.2    Comment: (NOTE) INR goal varies based on device and disease states. Performed at [facility], [address], [place], [place] [address]   POCT INR  Status: Normal   Collection Time: 08/02/20  9:36 AM  Result Value Ref Range   INR 2.8 2.0 - 3.0  CBC with Differential/Platelet     Status: None   Collection Time: 08/05/20  9:02 AM  Result Value Ref Range   WBC 7.7 3.4 - 10.8 x10E3/uL   RBC 5.24 4.14 - 5.80 x10E6/uL   Hemoglobin 14.3 13.0 - 17.7 g/dL   Hematocrit 45.2 37.5 - 51.0 %   MCV 86 79 - 97 fL   MCH 27.3 26.6 - 33.0 pg   MCHC 31.6 31.5 - 35.7 g/dL   RDW 12.7 11.6 - 15.4 %   Platelets 252 150 - 450 x10E3/uL   Neutrophils 66 Not Estab. %   Lymphs 22 Not Estab. %   Monocytes 10 Not Estab. %   Eos 1 Not Estab. %   Basos 1 Not Estab. %   Neutrophils Absolute 5.1 1.4 - 7.0 x10E3/uL   Lymphocytes Absolute 1.7 0.7 - 3.1 x10E3/uL   Monocytes Absolute 0.8 0.1 - 0.9 x10E3/uL   EOS (ABSOLUTE) 0.1 0.0 - 0.4 x10E3/uL   Basophils  Absolute 0.1 0.0 - 0.2 x10E3/uL   Immature Granulocytes 0 Not Estab. %   Immature Grans (Abs) 0.0 0.0 - 0.1 x10E3/uL  CMP14+EGFR     Status: Abnormal   Collection Time: 08/05/20  9:02 AM  Result Value Ref Range   Glucose 111 (H) 65 - 99 mg/dL   BUN 11 8 - 27 mg/dL   Creatinine, Ser 1.08 0.76 - 1.27 mg/dL   GFR calc non Af Amer 64 >59 mL/min/1.73   GFR calc Af Amer 74 >59 mL/min/1.73    Comment: **In accordance with recommendations from the NKF-ASN Task force,**   [facility] is in the process of updating its eGFR calculation to the   2021 CKD-EPI creatinine equation that estimates kidney function   without a race variable.    BUN/Creatinine Ratio 10 10 - 24   Sodium 144 134 - 144 mmol/L   Potassium 4.3 3.5 - 5.2 mmol/L   Chloride 104 96 - 106 mmol/L   CO2 23 20 - 29 mmol/L   Calcium 9.9 8.6 - 10.2 mg/dL   Total Protein 6.8 6.0 - 8.5 g/dL   Albumin 4.1 3.6 - 4.6 g/dL   Globulin, Total 2.7 1.5 - 4.5 g/dL   Albumin/Globulin Ratio 1.5 1.2 - 2.2   Bilirubin Total 0.4 0.0 - 1.2 mg/dL   Alkaline Phosphatase 74 44 - 121 IU/L   AST 21 0 - 40 IU/L   ALT 16 0 - 44 IU/L  Lipid panel     Status: Abnormal   Collection Time: 08/05/20  9:02 AM  Result Value Ref Range   Cholesterol, Total 130 100 - 199 mg/dL   Triglycerides 131 0 - 149 mg/dL   HDL 36 (L) >39 mg/dL   VLDL Cholesterol Cal 23 5 - 40 mg/dL   LDL Chol Calc (NIH) 71 0 - 99 mg/dL   Chol/HDL Ratio 3.6 0.0 - 5.0 ratio    Comment:                                   T. Chol/HDL Ratio  Men  Women                               1/2 Avg.Risk  3.4    3.3                                   Avg.Risk  5.0    4.4                                2X Avg.Risk  9.6    7.1                                3X Avg.Risk 23.4   11.0   VITAMIN D 25 Hydroxy (Vit-D Deficiency, Fractures)     Status: None   Collection Time: 08/05/20  9:02 AM  Result Value Ref Range   Vit D, 25-Hydroxy 62.6 30.0 - 100.0 ng/mL     Comment: Vitamin D deficiency has been defined by the [place] practice guideline as a level of serum 25-OH vitamin D less than 20 ng/mL (1,2). The Endocrine Society went on to further define vitamin D insufficiency as a level between 21 and 29 ng/mL (2). 1. IOM (Institute of Medicine). 2010. Dietary reference    intakes for calcium and D. [place]: The    [employer]. 2. Holick MF, Binkley [place], Bischoff-Ferrari HA, et al.    Evaluation, treatment, and prevention of vitamin D    deficiency: an Endocrine Society clinical practice    guideline. JCEM. 2011 Jul; 96(7):1911-30.   Bayer DCA Hb A1c Waived     Status: None   Collection Time: 08/05/20  9:55 AM  Result Value Ref Range   HB A1C (BAYER DCA - WAIVED) 6.3 <7.0 %    Comment:                                       Diabetic Adult            <7.0                                       Healthy Adult        4.3 - 5.7                                                           (DCCT/NGSP) American Diabetes Association's Summary of Glycemic Recommendations for Adults with Diabetes: Hemoglobin A1c <7.0%. More stringent glycemic goals (A1c <6.0%) may further reduce complications at the cost of increased risk of hypoglycemia.   POCT INR     Status: Normal   Collection Time: 08/16/20  8:52 AM  Result Value Ref Range   INR 3.0 2.0 - 3.0  POCT INR     Status: Normal   Collection Time: 09/15/20 11:00 AM  Result Value Ref Range   INR  2.5 2.0 - 3.0  Urinalysis, Routine w reflex microscopic     Status: None   Collection Time: 09/29/20 10:23 AM  Result Value Ref Range   Specific Gravity, UA 1.025 1.005 - 1.030   pH, UA 5.0 5.0 - 7.5   Color, UA Yellow Yellow   Appearance Ur Clear Clear   Leukocytes,UA Negative Negative   Protein,UA Negative Negative/Trace   Glucose, UA Negative Negative   Ketones, UA Negative Negative   RBC, UA Negative Negative   Bilirubin, UA Negative Negative    Urobilinogen, Ur 0.2 0.2 - 1.0 mg/dL   Nitrite, UA Negative Negative   Microscopic Examination Comment     Comment: Microscopic follows if indicated.   UA is clear.  Studies/Results: No results found.    Assessment & Plan: BPH with BOO with urgency and occasional UUI.  He will return prn  Microhematuria has resolved.  History of prostate abscess.   No evidence of recurrence.    No orders of the defined types were placed in this encounter.    Orders Placed This Encounter  Procedures  . Urinalysis, Routine w reflex microscopic      Return if symptoms worsen or fail to improve.   CC: Loman Brooklyn, FNP      Irine Seal 09/29/2020

## 2020-09-30 ENCOUNTER — Ambulatory Visit: Payer: Medicare Other | Admitting: Urology

## 2020-10-07 ENCOUNTER — Other Ambulatory Visit: Payer: Self-pay | Admitting: Family Medicine

## 2020-10-19 ENCOUNTER — Ambulatory Visit (INDEPENDENT_AMBULATORY_CARE_PROVIDER_SITE_OTHER): Payer: Medicare Other | Admitting: *Deleted

## 2020-10-19 DIAGNOSIS — I4891 Unspecified atrial fibrillation: Secondary | ICD-10-CM

## 2020-10-19 DIAGNOSIS — I9789 Other postprocedural complications and disorders of the circulatory system, not elsewhere classified: Secondary | ICD-10-CM | POA: Diagnosis not present

## 2020-10-19 DIAGNOSIS — Z5181 Encounter for therapeutic drug level monitoring: Secondary | ICD-10-CM | POA: Diagnosis not present

## 2020-10-19 DIAGNOSIS — I482 Chronic atrial fibrillation, unspecified: Secondary | ICD-10-CM

## 2020-10-19 DIAGNOSIS — I82402 Acute embolism and thrombosis of unspecified deep veins of left lower extremity: Secondary | ICD-10-CM | POA: Diagnosis not present

## 2020-10-19 LAB — POCT INR: INR: 2.2 (ref 2.0–3.0)

## 2020-10-19 NOTE — Patient Instructions (Signed)
Continue warfarin 1 tablet daily °Recheck in 6 wks °

## 2020-10-27 ENCOUNTER — Ambulatory Visit (INDEPENDENT_AMBULATORY_CARE_PROVIDER_SITE_OTHER): Payer: Medicare Other

## 2020-10-27 DIAGNOSIS — I6523 Occlusion and stenosis of bilateral carotid arteries: Secondary | ICD-10-CM

## 2020-10-27 DIAGNOSIS — I351 Nonrheumatic aortic (valve) insufficiency: Secondary | ICD-10-CM | POA: Diagnosis not present

## 2020-10-27 LAB — ECHOCARDIOGRAM COMPLETE
AR max vel: 2.2 cm2
AV Area VTI: 2.49 cm2
AV Area mean vel: 2.4 cm2
AV Mean grad: 5.5 mmHg
AV Peak grad: 11.4 mmHg
AV Vena cont: 0.7 cm
Ao pk vel: 1.69 m/s
Area-P 1/2: 4.8 cm2
Calc EF: 59.2 %
P 1/2 time: 389 msec
S' Lateral: 2.29 cm
Single Plane A2C EF: 42.3 %
Single Plane A4C EF: 67.4 %

## 2020-11-01 ENCOUNTER — Telehealth: Payer: Self-pay | Admitting: *Deleted

## 2020-11-01 NOTE — Telephone Encounter (Signed)
Echo looks good, normal heart function. Aortic valve has just a mild leak which is not worrisome    Zandra Abts MD

## 2020-11-01 NOTE — Telephone Encounter (Signed)
-----   Message from Arnoldo Lenis, MD sent at 11/01/2020  7:35 AM EDT ----- Carotid US shows mild blockages on both sides. We will continue to monitor   Zandra Abts MD

## 2020-11-08 ENCOUNTER — Ambulatory Visit (INDEPENDENT_AMBULATORY_CARE_PROVIDER_SITE_OTHER): Payer: Medicare Other | Admitting: Family Medicine

## 2020-11-08 ENCOUNTER — Encounter: Payer: Self-pay | Admitting: Family Medicine

## 2020-11-08 ENCOUNTER — Other Ambulatory Visit: Payer: Self-pay

## 2020-11-08 VITALS — BP 124/70 | HR 71 | Temp 97.3°F | Ht 66.0 in | Wt 178.6 lb

## 2020-11-08 DIAGNOSIS — Z86718 Personal history of other venous thrombosis and embolism: Secondary | ICD-10-CM | POA: Diagnosis not present

## 2020-11-08 DIAGNOSIS — E782 Mixed hyperlipidemia: Secondary | ICD-10-CM | POA: Diagnosis not present

## 2020-11-08 DIAGNOSIS — R7303 Prediabetes: Secondary | ICD-10-CM

## 2020-11-08 DIAGNOSIS — H02056 Trichiasis without entropian left eye, unspecified eyelid: Secondary | ICD-10-CM | POA: Diagnosis not present

## 2020-11-08 DIAGNOSIS — I2510 Atherosclerotic heart disease of native coronary artery without angina pectoris: Secondary | ICD-10-CM

## 2020-11-08 DIAGNOSIS — J449 Chronic obstructive pulmonary disease, unspecified: Secondary | ICD-10-CM | POA: Diagnosis not present

## 2020-11-08 DIAGNOSIS — I4891 Unspecified atrial fibrillation: Secondary | ICD-10-CM

## 2020-11-08 DIAGNOSIS — R2689 Other abnormalities of gait and mobility: Secondary | ICD-10-CM

## 2020-11-08 LAB — BAYER DCA HB A1C WAIVED: HB A1C (BAYER DCA - WAIVED): 6 % (ref <7.0)

## 2020-11-08 MED ORDER — KETOROLAC TROMETHAMINE 0.5 % OP SOLN: 1.0000 [drp] | Freq: Four times a day (QID) | OPHTHALMIC | 2 refills | Status: DC

## 2020-11-08 NOTE — Patient Instructions (Signed)
Wear compression hose. 

## 2020-11-08 NOTE — Telephone Encounter (Signed)
CAROTID -   Arnoldo Lenis, MD  11/01/2020 7:35 AM EDT      Carotid US shows mild blockages on both sides. We will continue to monitor   Zandra Abts MD

## 2020-11-08 NOTE — Telephone Encounter (Signed)
Laurine Blazer, LPN  3/95/8441 7:12 PM EDT Back to Top     Notified, copy to pcp.

## 2020-11-08 NOTE — Progress Notes (Signed)
Assessment & Plan:  1. Mixed hyperlipidemia Well controlled on current regimen.  Managed by lipid clinic. - Lipid panel - CBC with Differential/Platelet - CMP14+EGFR  2. Atrial fibrillation, unspecified type [facility]) Managed by cardiology.  Continue Coumadin. - CBC with Differential/Platelet - CMP14+EGFR  3. Coronary artery disease involving native coronary artery of native heart without angina pectoris On Repatha.  Intolerant of statins. - Lipid panel - CBC with Differential/Platelet - CMP14+EGFR  4. 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  Encouraged to wear compression hose.  5. Prediabetes A1c improved from 6.3 to 6.0. - Bayer DCA Hb A1c Waived  6. 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   7. Shuffling gait - Ambulatory referral to Neurology  8. 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  Dispense: 5 mL; Refill: 2   Return in about 3 months (around 02/08/2021) for follow-up of chronic medication conditions.  [name], MSN, APRN, FNP-C [facility]  Subjective:    Patient ID: [name], male    DOB: 1941/01/23, 80 y.o.   MRN: [id number]  Patient Care Team: Loman Brooklyn, FNP as PCP - General (Family Medicine) Harl Bowie Alphonse Guild, MD as PCP - Cardiology (Cardiology) Harl Bowie Alphonse Guild, MD as Consulting Physician (Cardiology) Irine Seal, MD as Attending Physician (Urology) Lavera Guise, [facility] (Pharmacist)   Chief Complaint:  Chief Complaint  Patient presents with  . Prediabetes  . COPD  . Hyperlipidemia    Check up of chronic medical conditions     HPI: [name] is a 80 y.o. male presenting on 11/08/2020 for Prediabetes, COPD, and Hyperlipidemia (Check up of chronic medical conditions )  Patient is accompanied by his wife who he  is okay with being present.  COPD: Patient is doing well with Breztri.  He has an albuterol inhaler to use if needed, as well as a nebulizer. He reports in the mornings when he wakes up, he feels bloated but this resolves with use of his nebulizer.   Hyperlipidemia/CAD: Patient is going to the lipid clinic.  He is doing Repatha injections every 2 weeks which he reports he is doing well with.    A-Fib: Managed by cardiology.  Patient goes to the anticoagulation clinic for INR monitoring and Coumadin adjustment.    Left leg DVT: Patient had to be off of his Coumadin for incarcerated hernia surgery after which he was subtherapeutic for several weeks.  During this time he developed a DVT in his left lower extremity. He reports he does still have some mild swelling in his left leg. He does not wear compression hose.   Vitamin D insufficiency: Patient continues to take vitamin D supplement once weekly.   New complaints: Patient is concerned that he is still shuffling his gait and dragging his right foot. He previously declined a referral to neurology or physical therapy.   Patient is requesting a refill of some eye drops previously given to him by an ophthalmologist (Ketorolac tromethamine 0.5%).   Social history:  Relevant past medical, surgical, family and social history reviewed and updated as indicated. Interim medical history since our last visit reviewed.  Allergies and medications reviewed and updated.  DATA REVIEWED: CHART IN EPIC  ROS: Negative unless specifically indicated above in HPI.    Current Outpatient Medications:  .  albuterol (PROVENTIL) (2.5 MG/3ML) 0.083% nebulizer solution, Take 3 mLs (2.5 mg total) by nebulization every 6 (six) hours as needed for wheezing or shortness of breath., Disp: 72 mL, Rfl: 0 .  albuterol (VENTOLIN HFA) 108 (90 Base) MCG/ACT inhaler, Inhale 2 puffs into the lungs every 6 (six) hours as needed for wheezing or shortness of breath., Disp: 18 g,  Rfl: 11 .  ARTIFICIAL TEAR OP, Place 1 drop into the left eye at bedtime., Disp: , Rfl:  .  Budeson-Glycopyrrol-Formoterol (BREZTRI AEROSPHERE) 160-9-4.8 MCG/ACT AERO, Inhale 2 puffs into the lungs in the morning and at bedtime., Disp: 42.8 g, Rfl: 12 .  Evolocumab (REPATHA SURECLICK) 654 MG/ML SOAJ, Inject 140 mg into the skin every 14 (fourteen) days., Disp: 2 mL, Rfl: 11 .  metoprolol tartrate (LOPRESSOR) 25 MG tablet, TAKE 1 AND 1/2 TABLETS TWICE DAILY ( NEW DOSE ), Disp: 270 tablet, Rfl: 3 .  omeprazole (PRILOSEC) 20 MG capsule, TAKE 1 CAPSULE EVERY DAY, Disp: 90 capsule, Rfl: 1 .  traZODone (DESYREL) 50 MG tablet, Take 1 tablet (50 mg total) by mouth at bedtime as needed for sleep., Disp: 90 tablet, Rfl: 1 .  Vitamin D, Ergocalciferol, (DRISDOL) 1.25 MG (50000 UNIT) CAPS capsule, Take 1 capsule (50,000 Units total) by mouth every 7 (seven) days., Disp: 12 capsule, Rfl: 1 .  warfarin (COUMADIN) 5 MG tablet, TAKE 1 TABLET DAILY OR AS DIRECTED, Disp: 90 tablet, Rfl: 3   Allergies  Allergen Reactions  . Asa [Aspirin]     GI upset  . Niacin     hot flashes  . Pravastatin     myalgias  . Rosuvastatin     myalgias  . Simvastatin     Myalgias    Past Medical History:  Diagnosis Date  . Anxiety disorder   . Benign prostatic hypertrophy   . Bilateral cataracts   . CAD (coronary artery disease)    Catheterization 2009, mild CAD  . Carotid artery disease ([place])    Doppler, September, 2011, less than 50% bilateral  . COPD (chronic obstructive pulmonary disease) ([place])   . CVA (cerebral infarction)    Hospitalization.  MRI...    Left anterior cerebral artery territory, September, 2011... Coumadin started  . DYSLIPIDEMIA 11/01/2009   Qualifier: Diagnosis of  By: Ron Parker, MD, Leonidas Romberg Dorinda Hill   . Ejection fraction    EF 60%  . Ejection fraction    EF 55-60%, echo, September, 2011  . Gastroesophageal reflux disease   . Hyperlipidemia   . Persistent atrial fibrillation ([place])       .  Ptosis of eyelid, left    Surgery in the past  . Sweating abnormality    Episodes of sweating appeared to be related to a delayed reaction to niacin  . Vitamin D insufficiency     Past Surgical History:  Procedure Laterality Date  . APPENDECTOMY    . CATARACT EXTRACTION, BILATERAL    . INGUINAL HERNIA REPAIR Left 07/02/2019  . INGUINAL HERNIA REPAIR Left 07/01/2020   Procedure: LEFT INGUINAL HERNIA REPAIR WITH MESH;  Surgeon: Virl Cagey, MD;  Location: [facility];  Service: General;  Laterality: Left;  . KNEE SURGERY Right 2019   torn ligament?   . TRANSURETHRAL RESECTION OF PROSTATE N/A 08/19/2018   Procedure: TRANSURETHRAL RESECTION OF THE PROSTATE (TURP) OF PROSTATE ABCESS;  Surgeon: Alexis Frock, MD;  Location: [facility];  Service: Urology;  Laterality: N/A;    Social History   Socioeconomic History  . Marital  status: Married    Spouse name: shirley  . Number of children: 5  . Years of education: 24  . Highest education level: 11th grade  Occupational History  . Occupation: Retired Statistician   Tobacco Use  . Smoking status: Former Smoker    Packs/day: 3.00    Years: 50.00    Pack years: 150.00    Types: Cigarettes    Quit date: 03/18/1990    Years since quitting: 30.6  . Smokeless tobacco: Former Systems developer    Types: [place] date: 06/18/2000  Vaping Use  . Vaping Use: Never used  Substance and Sexual Activity  . Alcohol use: No    Alcohol/week: 0.0 standard drinks  . Drug use: No  . Sexual activity: Not Currently  Other Topics Concern  . Not on file  Social History Narrative   Quit smoking in 1993. No significant alcohol abuse. Retired from trucking.    Social Determinants of Health   Financial Resource Strain: Not on file  Food Insecurity: Not on file  Transportation Needs: Not on file  Physical Activity: Not on file  Stress: Not on file  Social Connections: Not on file  Intimate Partner Violence: Not on file        Objective:    BP (!) 153/73    Pulse 71   Temp (!) 97.3 F (36.3 C) (Temporal)   Ht 5' 6"  (1.676 m)   Wt 178 lb 9.6 oz (81 kg)   SpO2 97%   BMI 28.83 kg/m   Wt Readings from Last 3 Encounters:  11/08/20 178 lb 9.6 oz (81 kg)  09/15/20 176 lb 6.4 oz (80 kg)  09/02/20 175 lb (79.4 kg)    Physical Exam Vitals reviewed.  Constitutional:      General: He is not in acute distress.    Appearance: Normal appearance. He is overweight. He is not ill-appearing, toxic-appearing or diaphoretic.  HENT:     Head: Normocephalic and atraumatic.  Eyes:     General: No scleral icterus.       Right eye: No discharge.        Left eye: No discharge.     Conjunctiva/sclera: Conjunctivae normal.  Cardiovascular:     Rate and Rhythm: Normal rate. Rhythm irregular.     Heart sounds: Normal heart sounds. No murmur heard. No friction rub. No gallop.   Pulmonary:     Effort: Pulmonary effort is normal. No respiratory distress.     Breath sounds: Normal breath sounds. No stridor. No wheezing, rhonchi or rales.  Musculoskeletal:        General: Normal range of motion.     Cervical back: Normal range of motion.  Skin:    General: Skin is warm and dry.  Neurological:     Mental Status: He is alert and oriented to person, place, and time. Mental status is at baseline.     Gait: Gait abnormal (shuffling).  Psychiatric:        Mood and Affect: Mood normal.        Behavior: Behavior normal.        Thought Content: Thought content normal.        Judgment: Judgment normal.     Lab Results  Component Value Date   TSH 4.360 04/07/2019   Lab Results  Component Value Date   WBC 7.7 08/05/2020   HGB 14.3 08/05/2020   HCT 45.2 08/05/2020   MCV 86 08/05/2020   PLT 252 08/05/2020  Lab Results  Component Value Date   NA 144 08/05/2020   K 4.3 08/05/2020   CO2 23 08/05/2020   GLUCOSE 111 (H) 08/05/2020   BUN 11 08/05/2020   CREATININE 1.08 08/05/2020   BILITOT 0.4 08/05/2020   ALKPHOS 74 08/05/2020   AST 21 08/05/2020    ALT 16 08/05/2020   PROT 6.8 08/05/2020   ALBUMIN 4.1 08/05/2020   CALCIUM 9.9 08/05/2020   ANIONGAP 8 06/29/2020   Lab Results  Component Value Date   CHOL 130 08/05/2020   Lab Results  Component Value Date   HDL 36 (L) 08/05/2020   Lab Results  Component Value Date   LDLCALC 71 08/05/2020   Lab Results  Component Value Date   TRIG 131 08/05/2020   Lab Results  Component Value Date   CHOLHDL 3.6 08/05/2020   Lab Results  Component Value Date   HGBA1C 6.3 08/05/2020

## 2020-11-09 ENCOUNTER — Encounter: Payer: Self-pay | Admitting: Family Medicine

## 2020-11-09 LAB — CMP14+EGFR
ALT: 8 IU/L (ref 0–44)
AST: 15 IU/L (ref 0–40)
Albumin/Globulin Ratio: 1.5 (ref 1.2–2.2)
Albumin: 4 g/dL (ref 3.6–4.6)
Alkaline Phosphatase: 68 IU/L (ref 44–121)
BUN/Creatinine Ratio: 14 (ref 10–24)
BUN: 15 mg/dL (ref 8–27)
Bilirubin Total: 0.3 mg/dL (ref 0.0–1.2)
CO2: 29 mmol/L (ref 20–29)
Calcium: 9.4 mg/dL (ref 8.6–10.2)
Chloride: 105 mmol/L (ref 96–106)
Creatinine, Ser: 1.04 mg/dL (ref 0.76–1.27)
Globulin, Total: 2.6 g/dL (ref 1.5–4.5)
Glucose: 93 mg/dL (ref 65–99)
Potassium: 4.3 mmol/L (ref 3.5–5.2)
Sodium: 144 mmol/L (ref 134–144)
Total Protein: 6.6 g/dL (ref 6.0–8.5)
eGFR: 72 mL/min/{1.73_m2} (ref >59)

## 2020-11-09 LAB — CBC WITH DIFFERENTIAL/PLATELET
Basophils Absolute: 0 10*3/uL (ref 0.0–0.2)
Basos: 1 %
EOS (ABSOLUTE): 0.1 10*3/uL (ref 0.0–0.4)
Eos: 1 %
Hematocrit: 44.9 % (ref 37.5–51.0)
Hemoglobin: 15.1 g/dL (ref 13.0–17.7)
Immature Grans (Abs): 0 10*3/uL (ref 0.0–0.1)
Immature Granulocytes: 0 %
Lymphocytes Absolute: 1.8 10*3/uL (ref 0.7–3.1)
Lymphs: 30 %
MCH: 30 pg (ref 26.6–33.0)
MCHC: 33.6 g/dL (ref 31.5–35.7)
MCV: 89 fL (ref 79–97)
Monocytes Absolute: 0.5 10*3/uL (ref 0.1–0.9)
Monocytes: 9 %
Neutrophils Absolute: 3.6 10*3/uL (ref 1.4–7.0)
Neutrophils: 59 %
Platelets: 228 10*3/uL (ref 150–450)
RBC: 5.04 x10E6/uL (ref 4.14–5.80)
RDW: 13.1 % (ref 11.6–15.4)
WBC: 6 10*3/uL (ref 3.4–10.8)

## 2020-11-09 LAB — LIPID PANEL
Chol/HDL Ratio: 3.9 ratio (ref 0.0–5.0)
Cholesterol, Total: 139 mg/dL (ref 100–199)
HDL: 36 mg/dL — ABNORMAL LOW (ref >39)
LDL Chol Calc (NIH): 80 mg/dL (ref 0–99)
Triglycerides: 127 mg/dL (ref 0–149)
VLDL Cholesterol Cal: 23 mg/dL (ref 5–40)

## 2020-11-17 ENCOUNTER — Telehealth: Payer: Self-pay | Admitting: Family Medicine

## 2020-11-17 NOTE — Telephone Encounter (Signed)
Pt calling to check on referral to neurologist

## 2020-11-30 ENCOUNTER — Ambulatory Visit (INDEPENDENT_AMBULATORY_CARE_PROVIDER_SITE_OTHER): Payer: Medicare Other | Admitting: *Deleted

## 2020-11-30 DIAGNOSIS — Z5181 Encounter for therapeutic drug level monitoring: Secondary | ICD-10-CM

## 2020-11-30 DIAGNOSIS — I482 Chronic atrial fibrillation, unspecified: Secondary | ICD-10-CM | POA: Diagnosis not present

## 2020-11-30 DIAGNOSIS — I4891 Unspecified atrial fibrillation: Secondary | ICD-10-CM

## 2020-11-30 LAB — POCT INR: INR: 3.1 — AB (ref 2.0–3.0)

## 2020-11-30 NOTE — Patient Instructions (Signed)
Take warfarin 1/2 tablet tonight then resume 1 tablet daily  Recheck in 6 wks

## 2020-12-05 DIAGNOSIS — R269 Unspecified abnormalities of gait and mobility: Secondary | ICD-10-CM | POA: Diagnosis not present

## 2020-12-05 DIAGNOSIS — H8113 Benign paroxysmal vertigo, bilateral: Secondary | ICD-10-CM | POA: Diagnosis not present

## 2020-12-05 DIAGNOSIS — R278 Other lack of coordination: Secondary | ICD-10-CM | POA: Diagnosis not present

## 2020-12-05 DIAGNOSIS — I482 Chronic atrial fibrillation, unspecified: Secondary | ICD-10-CM | POA: Diagnosis not present

## 2020-12-20 ENCOUNTER — Other Ambulatory Visit (HOSPITAL_COMMUNITY): Payer: Self-pay | Admitting: Neurology

## 2020-12-20 DIAGNOSIS — R278 Other lack of coordination: Secondary | ICD-10-CM

## 2020-12-20 DIAGNOSIS — R29818 Other symptoms and signs involving the nervous system: Secondary | ICD-10-CM

## 2020-12-22 ENCOUNTER — Telehealth: Payer: Self-pay | Admitting: Family Medicine

## 2020-12-22 MED ORDER — BREZTRI AEROSPHERE 160-9-4.8 MCG/ACT IN AERO: 2.0000 | INHALATION_SPRAY | Freq: Two times a day (BID) | RESPIRATORY_TRACT | 12 refills | Status: DC

## 2020-12-22 NOTE — Telephone Encounter (Signed)
Breztri refills sent to [facility] order (mail order pharmacy for [employer] patient assistance)

## 2020-12-22 NOTE — Telephone Encounter (Signed)
  Prescription Request  12/22/2020  What is the name of the medication or equipment? Breztri Inhaler  Have you contacted your pharmacy to request a refill? (if applicable) yes  Which pharmacy would you like this sent to? [facility]   Patient notified that their request is being sent to the clinical staff for review and that they should receive a response within 2 business days.    Joyce's pt.

## 2020-12-23 ENCOUNTER — Other Ambulatory Visit: Payer: Self-pay | Admitting: Family Medicine

## 2020-12-23 DIAGNOSIS — E559 Vitamin D deficiency, unspecified: Secondary | ICD-10-CM

## 2020-12-23 NOTE — Telephone Encounter (Signed)
Would you like patient to continue on prescription vitamin d?

## 2020-12-27 DIAGNOSIS — H8111 Benign paroxysmal vertigo, right ear: Secondary | ICD-10-CM | POA: Diagnosis not present

## 2020-12-27 DIAGNOSIS — R2681 Unsteadiness on feet: Secondary | ICD-10-CM | POA: Diagnosis not present

## 2020-12-27 DIAGNOSIS — R2689 Other abnormalities of gait and mobility: Secondary | ICD-10-CM | POA: Diagnosis not present

## 2020-12-27 DIAGNOSIS — Z9181 History of falling: Secondary | ICD-10-CM | POA: Diagnosis not present

## 2021-01-03 ENCOUNTER — Ambulatory Visit (HOSPITAL_COMMUNITY)
Admission: RE | Admit: 2021-01-03 | Discharge: 2021-01-03 | Disposition: A | Discharge status: Home / Self Care | Payer: Medicare Other | Source: Ambulatory Visit | Attending: Neurology | Admitting: Neurology

## 2021-01-03 ENCOUNTER — Other Ambulatory Visit: Payer: Self-pay

## 2021-01-03 DIAGNOSIS — R531 Weakness: Secondary | ICD-10-CM | POA: Diagnosis not present

## 2021-01-03 DIAGNOSIS — R42 Dizziness and giddiness: Secondary | ICD-10-CM | POA: Diagnosis not present

## 2021-01-03 DIAGNOSIS — R278 Other lack of coordination: Secondary | ICD-10-CM | POA: Diagnosis not present

## 2021-01-03 DIAGNOSIS — G319 Degenerative disease of nervous system, unspecified: Secondary | ICD-10-CM | POA: Diagnosis not present

## 2021-01-03 DIAGNOSIS — R29818 Other symptoms and signs involving the nervous system: Secondary | ICD-10-CM | POA: Diagnosis not present

## 2021-01-04 DIAGNOSIS — Z9181 History of falling: Secondary | ICD-10-CM | POA: Diagnosis not present

## 2021-01-04 DIAGNOSIS — R2689 Other abnormalities of gait and mobility: Secondary | ICD-10-CM | POA: Diagnosis not present

## 2021-01-04 DIAGNOSIS — R2681 Unsteadiness on feet: Secondary | ICD-10-CM | POA: Diagnosis not present

## 2021-01-04 DIAGNOSIS — H8111 Benign paroxysmal vertigo, right ear: Secondary | ICD-10-CM | POA: Diagnosis not present

## 2021-01-06 DIAGNOSIS — Z9181 History of falling: Secondary | ICD-10-CM | POA: Diagnosis not present

## 2021-01-06 DIAGNOSIS — H8111 Benign paroxysmal vertigo, right ear: Secondary | ICD-10-CM | POA: Diagnosis not present

## 2021-01-06 DIAGNOSIS — R2681 Unsteadiness on feet: Secondary | ICD-10-CM | POA: Diagnosis not present

## 2021-01-06 DIAGNOSIS — R2689 Other abnormalities of gait and mobility: Secondary | ICD-10-CM | POA: Diagnosis not present

## 2021-01-10 ENCOUNTER — Telehealth: Payer: Self-pay | Admitting: Family Medicine

## 2021-01-10 NOTE — Telephone Encounter (Signed)
Patient to call neurology office to obtain results

## 2021-01-11 ENCOUNTER — Ambulatory Visit (INDEPENDENT_AMBULATORY_CARE_PROVIDER_SITE_OTHER): Payer: Medicare Other | Admitting: *Deleted

## 2021-01-11 ENCOUNTER — Other Ambulatory Visit: Payer: Self-pay

## 2021-01-11 DIAGNOSIS — H8111 Benign paroxysmal vertigo, right ear: Secondary | ICD-10-CM | POA: Diagnosis not present

## 2021-01-11 DIAGNOSIS — I482 Chronic atrial fibrillation, unspecified: Secondary | ICD-10-CM

## 2021-01-11 DIAGNOSIS — I4891 Unspecified atrial fibrillation: Secondary | ICD-10-CM | POA: Diagnosis not present

## 2021-01-11 DIAGNOSIS — Z5181 Encounter for therapeutic drug level monitoring: Secondary | ICD-10-CM

## 2021-01-11 DIAGNOSIS — R2681 Unsteadiness on feet: Secondary | ICD-10-CM | POA: Diagnosis not present

## 2021-01-11 DIAGNOSIS — R2689 Other abnormalities of gait and mobility: Secondary | ICD-10-CM | POA: Diagnosis not present

## 2021-01-11 DIAGNOSIS — Z9181 History of falling: Secondary | ICD-10-CM | POA: Diagnosis not present

## 2021-01-11 LAB — POCT INR: INR: 3.7 — AB (ref 2.0–3.0)

## 2021-01-11 NOTE — Patient Instructions (Signed)
Hold warfarin tonight then decrease dose to 1 tablet daily except 1/2 tablet on Sundays and Wednesdays Recheck in 3 wks

## 2021-01-19 DIAGNOSIS — Z9181 History of falling: Secondary | ICD-10-CM | POA: Diagnosis not present

## 2021-01-19 DIAGNOSIS — H8111 Benign paroxysmal vertigo, right ear: Secondary | ICD-10-CM | POA: Diagnosis not present

## 2021-01-19 DIAGNOSIS — R2689 Other abnormalities of gait and mobility: Secondary | ICD-10-CM | POA: Diagnosis not present

## 2021-01-19 DIAGNOSIS — R2681 Unsteadiness on feet: Secondary | ICD-10-CM | POA: Diagnosis not present

## 2021-01-24 DIAGNOSIS — R2681 Unsteadiness on feet: Secondary | ICD-10-CM | POA: Diagnosis not present

## 2021-01-24 DIAGNOSIS — H8111 Benign paroxysmal vertigo, right ear: Secondary | ICD-10-CM | POA: Diagnosis not present

## 2021-01-24 DIAGNOSIS — R2689 Other abnormalities of gait and mobility: Secondary | ICD-10-CM | POA: Diagnosis not present

## 2021-01-24 DIAGNOSIS — Z9181 History of falling: Secondary | ICD-10-CM | POA: Diagnosis not present

## 2021-01-31 ENCOUNTER — Ambulatory Visit (INDEPENDENT_AMBULATORY_CARE_PROVIDER_SITE_OTHER): Payer: Medicare Other | Admitting: *Deleted

## 2021-01-31 DIAGNOSIS — Z5181 Encounter for therapeutic drug level monitoring: Secondary | ICD-10-CM | POA: Diagnosis not present

## 2021-01-31 DIAGNOSIS — I4891 Unspecified atrial fibrillation: Secondary | ICD-10-CM

## 2021-01-31 DIAGNOSIS — I482 Chronic atrial fibrillation, unspecified: Secondary | ICD-10-CM

## 2021-01-31 LAB — POCT INR: INR: 2.7 (ref 2.0–3.0)

## 2021-01-31 NOTE — Patient Instructions (Signed)
Continue warfarin 1 tablet daily except 1/2 tablet on Sundays and Wednesdays Recheck in 4 wks 

## 2021-02-06 DIAGNOSIS — R269 Unspecified abnormalities of gait and mobility: Secondary | ICD-10-CM | POA: Diagnosis not present

## 2021-02-06 DIAGNOSIS — I482 Chronic atrial fibrillation, unspecified: Secondary | ICD-10-CM | POA: Diagnosis not present

## 2021-02-06 DIAGNOSIS — R278 Other lack of coordination: Secondary | ICD-10-CM | POA: Diagnosis not present

## 2021-02-06 DIAGNOSIS — H8113 Benign paroxysmal vertigo, bilateral: Secondary | ICD-10-CM | POA: Diagnosis not present

## 2021-02-13 ENCOUNTER — Encounter: Payer: Self-pay | Admitting: Nurse Practitioner

## 2021-02-13 ENCOUNTER — Ambulatory Visit (INDEPENDENT_AMBULATORY_CARE_PROVIDER_SITE_OTHER): Payer: Medicare Other | Admitting: Nurse Practitioner

## 2021-02-13 ENCOUNTER — Other Ambulatory Visit: Payer: Self-pay

## 2021-02-13 VITALS — BP 162/69 | HR 63 | Temp 97.8°F | Ht 66.0 in | Wt 177.4 lb

## 2021-02-13 DIAGNOSIS — H532 Diplopia: Secondary | ICD-10-CM | POA: Insufficient documentation

## 2021-02-13 NOTE — Patient Instructions (Signed)
Diplopia Diplopia is a condition in which a person sees two of a single object. It is also called double vision. There are two types of diplopia. Monocular diplopia. This is double vision that affects only one eye. Monocular diplopia is often caused by a clouding of the lens in your eye (cataract) or by a problem in the way your eye focuses light. Binocular diplopia. This is double vision that affects both eyes. However, when you shut one eye, the double vision will go away. Binocular diplopia may be more serious. It can be caused by: Problems with the nerves or muscles that are responsible for eye movement. Disease of the nerves (neurologic disease). Immune system conditions, such as Graves' disease. Migraine headaches. Tumors. An infection. A stroke. An injury. There are many causes of diplopia. Some are not dangerous and can be easily corrected. Diplopia may also be a symptom of a serious medical problem. You may need to see a health care provider who specializes in eye conditions (ophthalmologist) or a nerve specialist (neurologist) to find the cause. Follow these instructions at home:  Pay attention to any changes in your vision. Tell your health care provider about them. Do not drive or operate heavy machinery if diplopia interferes with your vision. Keep all follow-up visits as told by your health care provider. This is important. Contact a health care provider if: Your diplopia gets worse. You develop any other symptoms along with your diplopia, such as: Weakness. Numbness. Headache. Eye pain. Clumsiness. Nausea. Drooping eyelids. Abnormal movement of one eye. Get help right away if you: Have sudden vision loss. Suddenly get a very bad headache. Have sudden weakness or numbness. Suddenly lose the ability to speak, understand speech, or both. These symptoms may represent a serious problem that is an emergency. Do not wait to see if the symptoms will go away. Get medical help  right away. Call your local emergency services (911 in the [place] Do not drive yourself to the hospital. Summary Diplopia is a condition in which a person sees two of a single object. It is also called double vision. Monocular diplopia is double vision that affects only one eye. It is often caused by a clouding of the lens in your eye (cataract) or by a problem in the way your eye focuses light. Binocular diplopia is double vision that affects both eyes. However, when you shut one eye, the double vision will go away. Binocular diplopia may be more serious. If you have diplopia, you may need to see a health care provider who specializes in eye conditions (ophthalmologist) or a nerve specialist (neurologist) to find the cause. This information is not intended to replace advice given to you by your health care provider. Make sure you discuss any questions you have with your healthcare provider. Document Revised: 02/04/2020 Document Reviewed: 02/04/2020 Elsevier Patient Education  2022 [employer].

## 2021-02-13 NOTE — Assessment & Plan Note (Signed)
Symptoms of diplopia in the last 3 days.  Only one episode and no recurrence. On assessment neurochecks intact, no signs and symptoms of stroke or palsy, no pain or dizzyness.  Advised patient to watch for repeat occurrence, seek emergency care, increase hydration, monitor blood pressure and keep eye appointment scheduled for 03/05/2021. Patient verbalize understanding.

## 2021-02-13 NOTE — Progress Notes (Signed)
 Acute Office Visit  Subjective:    Patient ID: [name], male    DOB: 01/03/1941, 80 y.o.   MRN: [id number]  Chief Complaint  Patient presents with   Diplopia    HPI Patient is an 80-year-old male who presents to clinic today  for diplopia.  Symptoms started 3 days ago but lasted only few minutes approximately 10 and only 1 occurrence.  Patient has had history of vertigo in the past and was treated with meclizine but denies headache, or new dizziness.  Appointment with eye doctor scheduled for 03/05/2021.  Patient does want to rule out any other problems since this is never happened before.  Past Medical History:  Diagnosis Date   Anxiety disorder    Benign prostatic hypertrophy    Bilateral cataracts    CAD (coronary artery disease)    Catheterization 2009, mild CAD   Carotid artery disease ([facility])    Doppler, September, 2011, less than 50% bilateral   COPD (chronic obstructive pulmonary disease) ([facility])    CVA (cerebral infarction)    Hospitalization.  MRI...    Left anterior cerebral artery territory, September, 2011... Coumadin started   DYSLIPIDEMIA 11/01/2009   Qualifier: Diagnosis of  By: Katz, MD, FACC, Jeffrey David    Ejection fraction    EF 60%   Ejection fraction    EF 55-60%, echo, September, 2011   Gastroesophageal reflux disease    Hyperlipidemia    Persistent atrial fibrillation ([facility])        Ptosis of eyelid, left    Surgery in the past   Sweating abnormality    Episodes of sweating appeared to be related to a delayed reaction to niacin   Vitamin D insufficiency     Past Surgical History:  Procedure Laterality Date   APPENDECTOMY     CATARACT EXTRACTION, BILATERAL     INGUINAL HERNIA REPAIR Left 07/02/2019   INGUINAL HERNIA REPAIR Left 07/01/2020   Procedure: LEFT INGUINAL HERNIA REPAIR WITH MESH;  Surgeon: Bridges, Lindsay C, MD;  Location: [facility];  Service: General;  Laterality: Left;   KNEE SURGERY Right 2019   torn ligament?    TRANSURETHRAL  RESECTION OF PROSTATE N/A 08/19/2018   Procedure: TRANSURETHRAL RESECTION OF THE PROSTATE (TURP) OF PROSTATE ABCESS;  Surgeon: Manny, Theodore, MD;  Location: [facility];  Service: Urology;  Laterality: N/A;    Family History  Problem Relation Age of Onset   Heart attack Mother    Other Other        Insignificant for premature CAD   Heart disease Sister    Hyperlipidemia Sister    Kidney disease Son    Heart attack Paternal Grandfather     Social History   Socioeconomic History   Marital status: Married    Spouse name: shirley   Number of children: 5   Years of education: 11   Highest education level: 11th grade  Occupational History   Occupation: Retired Maintence Tech   Tobacco Use   Smoking status: Former    Packs/day: 3.00    Years: 50.00    Pack years: 150.00    Types: Cigarettes    Quit date: 03/18/1990    Years since quitting: 30.9   Smokeless tobacco: Former    Types: Chew    Quit date: 06/18/2000  Vaping Use   Vaping Use: Never used  Substance and Sexual Activity   Alcohol use: No    Alcohol/week: 0.0 standard drinks   Drug use: No     Sexual activity: Not Currently  Other Topics Concern   Not on file  Social History Narrative   Quit smoking in 1993. No significant alcohol abuse. Retired from trucking.    Social Determinants of Health   Financial Resource Strain: Not on file  Food Insecurity: Not on file  Transportation Needs: Not on file  Physical Activity: Not on file  Stress: Not on file  Social Connections: Not on file  Intimate Partner Violence: Not on file    Outpatient Medications Prior to Visit  Medication Sig Dispense Refill   albuterol (PROVENTIL) (2.5 MG/3ML) 0.083% nebulizer solution Take 3 mLs (2.5 mg total) by nebulization every 6 (six) hours as needed for wheezing or shortness of breath. 72 mL 0   albuterol (VENTOLIN HFA) 108 (90 Base) MCG/ACT inhaler Inhale 2 puffs into the lungs every 6 (six) hours as needed for wheezing or shortness of  breath. 18 g 11   ARTIFICIAL TEAR OP Place 1 drop into the left eye at bedtime.     Budeson-Glycopyrrol-Formoterol (BREZTRI AEROSPHERE) 160-9-4.8 MCG/ACT AERO Inhale 2 puffs into the lungs in the morning and at bedtime. 10.7 g 12   Evolocumab (REPATHA SURECLICK) 140 MG/ML SOAJ Inject 140 mg into the skin every 14 (fourteen) days. 2 mL 11   ketorolac (ACULAR) 0.5 % ophthalmic solution Place 1 drop into the left eye 4 (four) times daily. 5 mL 2   metoprolol tartrate (LOPRESSOR) 25 MG tablet TAKE 1 AND 1/2 TABLETS TWICE DAILY ( NEW DOSE ) 270 tablet 3   omeprazole (PRILOSEC) 20 MG capsule TAKE 1 CAPSULE EVERY DAY 90 capsule 1   traZODone (DESYREL) 50 MG tablet Take 1 tablet (50 mg total) by mouth at bedtime as needed for sleep. 90 tablet 1   Vitamin D, Ergocalciferol, (DRISDOL) 1.25 MG (50000 UNIT) CAPS capsule TAKE 1 CAPSULE EVERY 7 DAYS. 12 capsule 1   warfarin (COUMADIN) 5 MG tablet TAKE 1 TABLET DAILY OR AS DIRECTED 90 tablet 3   No facility-administered medications prior to visit.    Allergies  Allergen Reactions   Asa [Aspirin]     GI upset   Niacin     hot flashes   Pravastatin     myalgias   Rosuvastatin     myalgias   Simvastatin     Myalgias     Review of Systems  Constitutional: Negative.   HENT: Negative.    Respiratory: Negative.    Cardiovascular: Negative.   Gastrointestinal: Negative.   Skin:  Negative for rash.  Psychiatric/Behavioral: Negative.  The patient is not nervous/anxious.   All other systems reviewed and are negative.     Objective:    Physical Exam Vitals and nursing note reviewed.  Constitutional:      Appearance: Normal appearance.  HENT:     Head: Normocephalic.     Right Ear: External ear normal. There is no impacted cerumen.     Left Ear: External ear normal. There is no impacted cerumen.     Nose: Nose normal.     Mouth/Throat:     Mouth: Mucous membranes are moist.     Pharynx: Oropharynx is clear.  Eyes:     Conjunctiva/sclera:  Conjunctivae normal.  Cardiovascular:     Rate and Rhythm: Normal rate and regular rhythm.  Pulmonary:     Effort: Pulmonary effort is normal.  Abdominal:     General: Bowel sounds are normal.  Skin:    General: Skin is warm.     Findings:   No rash.  Neurological:     General: No focal deficit present.     Mental Status: He is alert and oriented to person, place, and time.     Cranial Nerves: Cranial nerves are intact.     Sensory: Sensation is intact.     Motor: Motor function is intact.     Coordination: Coordination is intact.     Gait: Gait is intact.  Psychiatric:        Behavior: Behavior normal.    BP (!) 162/69   Pulse 63   Temp 97.8 F (36.6 C) (Temporal)   Ht 5' 6" (1.676 m)   Wt 177 lb 6 oz (80.5 kg)   BMI 28.63 kg/m  Wt Readings from Last 3 Encounters:  02/13/21 177 lb 6 oz (80.5 kg)  11/08/20 178 lb 9.6 oz (81 kg)  09/15/20 176 lb 6.4 oz (80 kg)    Health Maintenance Due  Topic Date Due   Zoster Vaccines- Shingrix (1 of 2) Never done    There are no preventive care reminders to display for this patient.   Lab Results  Component Value Date   TSH 4.360 04/07/2019   Lab Results  Component Value Date   WBC 6.0 11/08/2020   HGB 15.1 11/08/2020   HCT 44.9 11/08/2020   MCV 89 11/08/2020   PLT 228 11/08/2020   Lab Results  Component Value Date   NA 144 11/08/2020   K 4.3 11/08/2020   CO2 29 11/08/2020   GLUCOSE 93 11/08/2020   BUN 15 11/08/2020   CREATININE 1.04 11/08/2020   BILITOT 0.3 11/08/2020   ALKPHOS 68 11/08/2020   AST 15 11/08/2020   ALT 8 11/08/2020   PROT 6.6 11/08/2020   ALBUMIN 4.0 11/08/2020   CALCIUM 9.4 11/08/2020   ANIONGAP 8 06/29/2020   EGFR 72 11/08/2020   Lab Results  Component Value Date   CHOL 139 11/08/2020   Lab Results  Component Value Date   HDL 36 (L) 11/08/2020   Lab Results  Component Value Date   LDLCALC 80 11/08/2020   Lab Results  Component Value Date   TRIG 127 11/08/2020   Lab Results   Component Value Date   CHOLHDL 3.9 11/08/2020   Lab Results  Component Value Date   HGBA1C 6.0 11/08/2020       Assessment & Plan:   Problem List Items Addressed This Visit       Other   Diplopia - Primary    Symptoms of diplopia in the last 3 days.  Only one episode and no recurrence. On assessment neurochecks intact, no signs and symptoms of stroke or palsy, no pain or dizzyness.  Advised patient to watch for repeat occurrence, seek emergency care, increase hydration, monitor blood pressure and keep eye appointment scheduled for 03/05/2021. Patient verbalize understanding.            No orders of the defined types were placed in this encounter.    Ivy Lynn, NP

## 2021-02-17 ENCOUNTER — Encounter: Payer: Self-pay | Admitting: Family Medicine

## 2021-02-17 ENCOUNTER — Ambulatory Visit (INDEPENDENT_AMBULATORY_CARE_PROVIDER_SITE_OTHER): Payer: Medicare Other | Admitting: Family Medicine

## 2021-02-17 ENCOUNTER — Other Ambulatory Visit: Payer: Self-pay

## 2021-02-17 VITALS — BP 136/76 | HR 65 | Temp 97.7°F | Ht 66.0 in | Wt 175.4 lb

## 2021-02-17 DIAGNOSIS — K219 Gastro-esophageal reflux disease without esophagitis: Secondary | ICD-10-CM | POA: Diagnosis not present

## 2021-02-17 DIAGNOSIS — R2689 Other abnormalities of gait and mobility: Secondary | ICD-10-CM | POA: Diagnosis not present

## 2021-02-17 DIAGNOSIS — R42 Dizziness and giddiness: Secondary | ICD-10-CM | POA: Diagnosis not present

## 2021-02-17 DIAGNOSIS — I480 Paroxysmal atrial fibrillation: Secondary | ICD-10-CM | POA: Diagnosis not present

## 2021-02-17 DIAGNOSIS — E782 Mixed hyperlipidemia: Secondary | ICD-10-CM | POA: Diagnosis not present

## 2021-02-17 DIAGNOSIS — E559 Vitamin D deficiency, unspecified: Secondary | ICD-10-CM

## 2021-02-17 DIAGNOSIS — Z86718 Personal history of other venous thrombosis and embolism: Secondary | ICD-10-CM | POA: Diagnosis not present

## 2021-02-17 DIAGNOSIS — R7303 Prediabetes: Secondary | ICD-10-CM | POA: Diagnosis not present

## 2021-02-17 DIAGNOSIS — R5383 Other fatigue: Secondary | ICD-10-CM | POA: Diagnosis not present

## 2021-02-17 DIAGNOSIS — J449 Chronic obstructive pulmonary disease, unspecified: Secondary | ICD-10-CM

## 2021-02-17 DIAGNOSIS — I2510 Atherosclerotic heart disease of native coronary artery without angina pectoris: Secondary | ICD-10-CM | POA: Diagnosis not present

## 2021-02-17 LAB — BAYER DCA HB A1C WAIVED: HB A1C (BAYER DCA - WAIVED): 5.9 % (ref <7.0)

## 2021-02-17 LAB — CMP14+EGFR
ALT: 11 IU/L (ref 0–44)
AST: 18 IU/L (ref 0–40)
Albumin/Globulin Ratio: 1.8 (ref 1.2–2.2)
Albumin: 4.1 g/dL (ref 3.6–4.6)
Alkaline Phosphatase: 63 IU/L (ref 44–121)
BUN/Creatinine Ratio: 17 (ref 10–24)
BUN: 19 mg/dL (ref 8–27)
Bilirubin Total: 0.4 mg/dL (ref 0.0–1.2)
CO2: 27 mmol/L (ref 20–29)
Calcium: 9.3 mg/dL (ref 8.6–10.2)
Chloride: 104 mmol/L (ref 96–106)
Creatinine, Ser: 1.1 mg/dL (ref 0.76–1.27)
Globulin, Total: 2.3 g/dL (ref 1.5–4.5)
Glucose: 93 mg/dL (ref 65–99)
Potassium: 4.6 mmol/L (ref 3.5–5.2)
Sodium: 142 mmol/L (ref 134–144)
Total Protein: 6.4 g/dL (ref 6.0–8.5)
eGFR: 67 mL/min/{1.73_m2} (ref >59)

## 2021-02-17 LAB — CBC WITH DIFFERENTIAL/PLATELET
Basophils Absolute: 0 10*3/uL (ref 0.0–0.2)
Basos: 0 %
EOS (ABSOLUTE): 0.1 10*3/uL (ref 0.0–0.4)
Eos: 1 %
Hematocrit: 44.8 % (ref 37.5–51.0)
Hemoglobin: 14.6 g/dL (ref 13.0–17.7)
Immature Grans (Abs): 0 10*3/uL (ref 0.0–0.1)
Immature Granulocytes: 0 %
Lymphocytes Absolute: 1.8 10*3/uL (ref 0.7–3.1)
Lymphs: 27 %
MCH: 29.5 pg (ref 26.6–33.0)
MCHC: 32.6 g/dL (ref 31.5–35.7)
MCV: 91 fL (ref 79–97)
Monocytes Absolute: 0.7 10*3/uL (ref 0.1–0.9)
Monocytes: 10 %
Neutrophils Absolute: 4.1 10*3/uL (ref 1.4–7.0)
Neutrophils: 62 %
Platelets: 236 10*3/uL (ref 150–450)
RBC: 4.95 x10E6/uL (ref 4.14–5.80)
RDW: 13.1 % (ref 11.6–15.4)
WBC: 6.7 10*3/uL (ref 3.4–10.8)

## 2021-02-17 LAB — LIPID PANEL
Chol/HDL Ratio: 3.6 ratio (ref 0.0–5.0)
Cholesterol, Total: 122 mg/dL (ref 100–199)
HDL: 34 mg/dL — ABNORMAL LOW (ref >39)
LDL Chol Calc (NIH): 63 mg/dL (ref 0–99)
Triglycerides: 145 mg/dL (ref 0–149)
VLDL Cholesterol Cal: 25 mg/dL (ref 5–40)

## 2021-02-17 NOTE — Progress Notes (Addendum)
Assessment & Plan:  1. 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   2. Prediabetes Well controlled without medication. - Bayer DCA Hb A1c Waived  3. Mixed hyperlipidemia Well controlled on current regimen. Managed by lipid clinic. - CBC with Differential/Platelet - CMP14+EGFR - Lipid panel  4. Paroxysmal atrial fibrillation (HCC) Well controlled on current regimen. Managed by cardiologist. - CBC with Differential/Platelet - CMP14+EGFR  5. Coronary artery disease involving native coronary artery of native heart without angina pectoris Well controlled on current regimen. Managed by cardiology/lipid clinic. - CMP14+EGFR - Lipid panel  6. 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 Encouraged to wear compression hose due to LLE edema. - CBC with Differential/Platelet - CMP14+EGFR  7. Gastroesophageal reflux disease, unspecified whether esophagitis present Well controlled on current regimen.  - CMP14+EGFR  8. Vertigo Resolved with physical therapy.  9. Vitamin D insufficiency Continue vitamin D supplement.  10. Shuffling gait Due to fatigue per patient/neurology.  11. Fatigue, unspecified type Negative work-up with labs, ECHO, and carotid dopplers. Will call neurologist and see if patient can get scheduled for a sleep study. Discussed with patient that this may be due to his age and chronic medical conditions taking a toll on his body.    Addendum: I spoke to [facility] on 02/21/2021 regarding an appointment for possible sleep study. They are going to call and get him scheduled. My office note has been faxed over to them as requested.   Return in about 3 months (around 05/19/2021) for follow-up of chronic medication conditions.  [name], MSN, APRN, FNP-C [facility]  Subjective:    Patient ID: [name],  male    DOB: [birth year], 80 y.o.   MRN: [id number]  Patient Care Team: Loman Brooklyn, FNP as PCP - General (Family Medicine) Harl Bowie Alphonse Guild, MD as PCP - Cardiology (Cardiology) Harl Bowie Alphonse Guild, MD as Consulting Physician (Cardiology) Irine Seal, MD as Attending Physician (Urology) Lavera Guise, [facility] (Pharmacist)   Chief Complaint:  Chief Complaint  Patient presents with   Prediabetes   Hyperlipidemia    Patient states that he seen double vision once x 2 weeks ago. Has eye apt 9/18   Fatigue    X 2 years     HPI: [name] is a 80 y.o. male presenting on 02/17/2021 for Prediabetes, Hyperlipidemia (Patient states that he seen double vision once x 2 weeks ago. Has eye apt 9/18), and Fatigue (X 2 years )  Patient is accompanied by his wife who he is okay with being present.  COPD: Patient is doing well with Breztri.  He has an albuterol inhaler to use if needed, as well as a nebulizer.   Hyperlipidemia/CAD: Patient is going to the lipid clinic.  He is doing Repatha injections every 2 weeks which he reports he is doing well with.    A-Fib: Managed by cardiology.  Patient goes to the anticoagulation clinic for INR monitoring and Coumadin adjustment.    Left leg DVT: Patient had to be off of his Coumadin for incarcerated hernia surgery after which he was subtherapeutic for several weeks.  During this time he developed a DVT in his left lower extremity. He reports he does still have some mild swelling in his left leg. He does not wear compression hose.   Vitamin D insufficiency: Patient continues to take vitamin D supplement once weekly.  Dizziness: patient has been doing physical therapy which has resolved his dizziness.  Gait abnormality: patient was referred to neurology where he was told he does not have Parkinson's disease after they watched him walk down the hallway. He reports today he is dragging his feet when he gets really tired. States he feels tired and just  doesn't have any energy all the time. He has been reporting fatigue for at least 2 years now. His wife reports he does snore, but not has much as he use to. He is not aware of any pausing in breathing during the night.   Height: 5' 6"  (1.676 m)  Weight: 175.4 lbs BMI: Body mass index is 28.31 kg/m.  Do you snore loudly (louder than talking or loud enough to be heard through closed doors)?  Yes Do you often feel tired, fatigued, or sleepy during daytime?  Yes Has anyone observed you stop breathing during your sleep?  No Do you have or are you being treated for high blood pressure?  No BMI more than 35 kg/m2?  No Age over 21 years?  Yes Neck circumference greater than 40 cm?  Unknown Male gender?  Yes  Total "Yes" Answers: 4   New complaints: None   Social history:  Relevant past medical, surgical, family and social history reviewed and updated as indicated. Interim medical history since our last visit reviewed.  Allergies and medications reviewed and updated.  DATA REVIEWED: CHART IN EPIC  ROS: Negative unless specifically indicated above in HPI.    Current Outpatient Medications:    albuterol (PROVENTIL) (2.5 MG/3ML) 0.083% nebulizer solution, Take 3 mLs (2.5 mg total) by nebulization every 6 (six) hours as needed for wheezing or shortness of breath., Disp: 72 mL, Rfl: 0   albuterol (VENTOLIN HFA) 108 (90 Base) MCG/ACT inhaler, Inhale 2 puffs into the lungs every 6 (six) hours as needed for wheezing or shortness of breath., Disp: 18 g, Rfl: 11   ARTIFICIAL TEAR OP, Place 1 drop into the left eye at bedtime., Disp: , Rfl:    Budeson-Glycopyrrol-Formoterol (BREZTRI AEROSPHERE) 160-9-4.8 MCG/ACT AERO, Inhale 2 puffs into the lungs in the morning and at bedtime., Disp: 10.7 g, Rfl: 12   Evolocumab (REPATHA SURECLICK) 937 MG/ML SOAJ, Inject 140 mg into the skin every 14 (fourteen) days., Disp: 2 mL, Rfl: 11   ketorolac (ACULAR) 0.5 % ophthalmic solution, Place 1 drop into  the left eye 4 (four) times daily., Disp: 5 mL, Rfl: 2   metoprolol tartrate (LOPRESSOR) 25 MG tablet, TAKE 1 AND 1/2 TABLETS TWICE DAILY ( NEW DOSE ), Disp: 270 tablet, Rfl: 3   omeprazole (PRILOSEC) 20 MG capsule, TAKE 1 CAPSULE EVERY DAY, Disp: 90 capsule, Rfl: 1   traZODone (DESYREL) 50 MG tablet, Take 1 tablet (50 mg total) by mouth at bedtime as needed for sleep., Disp: 90 tablet, Rfl: 1   Vitamin D, Ergocalciferol, (DRISDOL) 1.25 MG (50000 UNIT) CAPS capsule, TAKE 1 CAPSULE EVERY 7 DAYS., Disp: 12 capsule, Rfl: 1   warfarin (COUMADIN) 5 MG tablet, TAKE 1 TABLET DAILY OR AS DIRECTED, Disp: 90 tablet, Rfl: 3   Allergies  Allergen Reactions   Asa [Aspirin]     GI upset   Niacin     hot flashes   Pravastatin     myalgias   Rosuvastatin     myalgias   Simvastatin     Myalgias    Past Medical History:  Diagnosis Date   Anxiety disorder    Benign prostatic  hypertrophy    Bilateral cataracts    CAD (coronary artery disease)    Catheterization 2009, mild CAD   Carotid artery disease (HCC)    Doppler, September, 2011, less than 50% bilateral   COPD (chronic obstructive pulmonary disease) (HCC)    CVA (cerebral infarction)    Hospitalization.  MRI...    Left anterior cerebral artery territory, September, 2011... Coumadin started   DYSLIPIDEMIA 11/01/2009   Qualifier: Diagnosis of  By: Ron Parker, MD, [facility], [place]    Ejection fraction    EF 60%   Ejection fraction    EF 55-60%, echo, September, 2011   Gastroesophageal reflux disease    Hyperlipidemia    Persistent atrial fibrillation (HCC)        Ptosis of eyelid, left    Surgery in the past   Sweating abnormality    Episodes of sweating appeared to be related to a delayed reaction to niacin   Vitamin D insufficiency     Past Surgical History:  Procedure Laterality Date   APPENDECTOMY     CATARACT EXTRACTION, BILATERAL     INGUINAL HERNIA REPAIR Left 07/02/2019   INGUINAL HERNIA REPAIR Left 07/01/2020   Procedure:  LEFT INGUINAL HERNIA REPAIR WITH MESH;  Surgeon: Virl Cagey, MD;  Location: [facility];  Service: General;  Laterality: Left;   KNEE SURGERY Right 2019   torn ligament?    TRANSURETHRAL RESECTION OF PROSTATE N/A 08/19/2018   Procedure: TRANSURETHRAL RESECTION OF THE PROSTATE (TURP) OF PROSTATE ABCESS;  Surgeon: Alexis Frock, MD;  Location: [facility];  Service: Urology;  Laterality: N/A;    Social History   Socioeconomic History   Marital status: Married    Spouse name: shirley   Number of children: 5   Years of education: 11   Highest education level: 11th grade  Occupational History   Occupation: Retired Statistician   Tobacco Use   Smoking status: Former    Packs/day: 3.00    Years: 50.00    Pack years: 150.00    Types: Cigarettes    Quit date: 03/18/1990    Years since quitting: 30.9   Smokeless tobacco: Former    Types: Chew    Quit date: 06/18/2000  Vaping Use   Vaping Use: Never used  Substance and Sexual Activity   Alcohol use: No    Alcohol/week: 0.0 standard drinks   Drug use: No   Sexual activity: Not Currently  Other Topics Concern   Not on file  Social History Narrative   Quit smoking in 1993. No significant alcohol abuse. Retired from trucking.    Social Determinants of Health   Financial Resource Strain: Not on file  Food Insecurity: Not on file  Transportation Needs: Not on file  Physical Activity: Not on file  Stress: Not on file  Social Connections: Not on file  Intimate Partner Violence: Not on file        Objective:    BP 136/76   Pulse 65   Temp 97.7 F (36.5 C) (Temporal)   Ht 5' 6"  (1.676 m)   Wt 175 lb 6.4 oz (79.6 kg)   SpO2 96%   BMI 28.31 kg/m   Wt Readings from Last 3 Encounters:  02/17/21 175 lb 6.4 oz (79.6 kg)  02/13/21 177 lb 6 oz (80.5 kg)  11/08/20 178 lb 9.6 oz (81 kg)    Physical Exam Vitals reviewed.  Constitutional:      General: He is not in acute distress.  Appearance: Normal appearance. He is  overweight. He is not ill-appearing, toxic-appearing or diaphoretic.  HENT:     Head: Normocephalic and atraumatic.  Eyes:     General: No scleral icterus.       Right eye: No discharge.        Left eye: No discharge.     Conjunctiva/sclera: Conjunctivae normal.  Cardiovascular:     Rate and Rhythm: Normal rate. Rhythm irregular.     Heart sounds: Normal heart sounds. No murmur heard.   No friction rub. No gallop.  Pulmonary:     Effort: Pulmonary effort is normal. No respiratory distress.     Breath sounds: Normal breath sounds. No stridor. No wheezing, rhonchi or rales.  Musculoskeletal:        General: Normal range of motion.     Cervical back: Normal range of motion.  Skin:    General: Skin is warm and dry.  Neurological:     Mental Status: He is alert and oriented to person, place, and time. Mental status is at baseline.     Gait: Gait abnormal (shuffling).  Psychiatric:        Mood and Affect: Mood normal.        Behavior: Behavior normal.        Thought Content: Thought content normal.        Judgment: Judgment normal.    Lab Results  Component Value Date   TSH 4.360 04/07/2019   Lab Results  Component Value Date   WBC 6.0 11/08/2020   HGB 15.1 11/08/2020   HCT 44.9 11/08/2020   MCV 89 11/08/2020   PLT 228 11/08/2020   Lab Results  Component Value Date   NA 144 11/08/2020   K 4.3 11/08/2020   CO2 29 11/08/2020   GLUCOSE 93 11/08/2020   BUN 15 11/08/2020   CREATININE 1.04 11/08/2020   BILITOT 0.3 11/08/2020   ALKPHOS 68 11/08/2020   AST 15 11/08/2020   ALT 8 11/08/2020   PROT 6.6 11/08/2020   ALBUMIN 4.0 11/08/2020   CALCIUM 9.4 11/08/2020   ANIONGAP 8 06/29/2020   EGFR 72 11/08/2020   Lab Results  Component Value Date   CHOL 139 11/08/2020   Lab Results  Component Value Date   HDL 36 (L) 11/08/2020   Lab Results  Component Value Date   LDLCALC 80 11/08/2020   Lab Results  Component Value Date   TRIG 127 11/08/2020   Lab Results   Component Value Date   CHOLHDL 3.9 11/08/2020   Lab Results  Component Value Date   HGBA1C 6.0 11/08/2020

## 2021-02-17 NOTE — Patient Instructions (Signed)
Reminder: please return after mid-September for your flu shot. If you receive this elsewhere, such as your pharmacy, please let us know so we can get this documented in your chart.   

## 2021-02-23 DIAGNOSIS — R269 Unspecified abnormalities of gait and mobility: Secondary | ICD-10-CM | POA: Diagnosis not present

## 2021-02-23 DIAGNOSIS — R5383 Other fatigue: Secondary | ICD-10-CM | POA: Diagnosis not present

## 2021-02-23 DIAGNOSIS — D510 Vitamin B12 deficiency anemia due to intrinsic factor deficiency: Secondary | ICD-10-CM | POA: Diagnosis not present

## 2021-02-23 DIAGNOSIS — H8113 Benign paroxysmal vertigo, bilateral: Secondary | ICD-10-CM | POA: Diagnosis not present

## 2021-02-23 DIAGNOSIS — I482 Chronic atrial fibrillation, unspecified: Secondary | ICD-10-CM | POA: Diagnosis not present

## 2021-02-23 DIAGNOSIS — R278 Other lack of coordination: Secondary | ICD-10-CM | POA: Diagnosis not present

## 2021-02-23 DIAGNOSIS — G4733 Obstructive sleep apnea (adult) (pediatric): Secondary | ICD-10-CM | POA: Diagnosis not present

## 2021-02-28 ENCOUNTER — Ambulatory Visit (INDEPENDENT_AMBULATORY_CARE_PROVIDER_SITE_OTHER): Payer: Medicare Other | Admitting: *Deleted

## 2021-02-28 DIAGNOSIS — I482 Chronic atrial fibrillation, unspecified: Secondary | ICD-10-CM | POA: Diagnosis not present

## 2021-02-28 DIAGNOSIS — Z5181 Encounter for therapeutic drug level monitoring: Secondary | ICD-10-CM

## 2021-02-28 DIAGNOSIS — I4891 Unspecified atrial fibrillation: Secondary | ICD-10-CM

## 2021-02-28 LAB — POCT INR: INR: 2.4 (ref 2.0–3.0)

## 2021-02-28 NOTE — Patient Instructions (Signed)
Continue warfarin 1 tablet daily except 1/2 tablet on Sundays and Wednesdays Recheck in 4 wks 

## 2021-03-17 ENCOUNTER — Ambulatory Visit (INDEPENDENT_AMBULATORY_CARE_PROVIDER_SITE_OTHER): Payer: Medicare Other | Admitting: Cardiology

## 2021-03-17 ENCOUNTER — Ambulatory Visit (INDEPENDENT_AMBULATORY_CARE_PROVIDER_SITE_OTHER): Payer: Medicare Other

## 2021-03-17 ENCOUNTER — Encounter: Payer: Self-pay | Admitting: Cardiology

## 2021-03-17 ENCOUNTER — Other Ambulatory Visit: Payer: Self-pay

## 2021-03-17 VITALS — BP 142/62 | HR 71 | Ht 66.0 in | Wt 178.6 lb

## 2021-03-17 VITALS — Ht 66.0 in | Wt 176.0 lb

## 2021-03-17 DIAGNOSIS — Z20828 Contact with and (suspected) exposure to other viral communicable diseases: Secondary | ICD-10-CM | POA: Diagnosis not present

## 2021-03-17 DIAGNOSIS — I6523 Occlusion and stenosis of bilateral carotid arteries: Secondary | ICD-10-CM | POA: Diagnosis not present

## 2021-03-17 DIAGNOSIS — I2510 Atherosclerotic heart disease of native coronary artery without angina pectoris: Secondary | ICD-10-CM | POA: Diagnosis not present

## 2021-03-17 DIAGNOSIS — I4891 Unspecified atrial fibrillation: Secondary | ICD-10-CM

## 2021-03-17 DIAGNOSIS — Z0000 Encounter for general adult medical examination without abnormal findings: Secondary | ICD-10-CM | POA: Diagnosis not present

## 2021-03-17 DIAGNOSIS — Z23 Encounter for immunization: Secondary | ICD-10-CM | POA: Diagnosis not present

## 2021-03-17 DIAGNOSIS — E782 Mixed hyperlipidemia: Secondary | ICD-10-CM | POA: Diagnosis not present

## 2021-03-17 MED ORDER — METOPROLOL TARTRATE 25 MG PO TABS: 25.0000 mg | ORAL_TABLET | Freq: Two times a day (BID) | ORAL | Status: DC

## 2021-03-17 NOTE — Patient Instructions (Signed)
Medication Instructions:  Decrease Lopressor to 25mg twice a day   Continue all other medications.     Labwork: none  Testing/Procedures: none  Follow-Up: 6 months   Any Other Special Instructions Will Be Listed Below (If Applicable).   If you need a refill on your cardiac medications before your next appointment, please call your pharmacy.  

## 2021-03-17 NOTE — Patient Instructions (Signed)
[name] , Thank you for taking time to come for your Medicare Wellness Visit. I appreciate your ongoing commitment to your health goals. Please review the following plan we discussed and let me know if I can assist you in the future.   Screening recommendations/referrals: Colonoscopy: No longer required Recommended yearly ophthalmology/optometry visit for glaucoma screening and checkup Recommended yearly dental visit for hygiene and checkup  Vaccinations: Influenza vaccine: Done 03/17/2021 - Repeat in 10 years  Pneumococcal vaccine: Done 03/18/2005 & 12/17/2013 Tdap vaccine: Due. Every 10 years Shingles vaccine: Shingrix discussed. Please contact your pharmacy for coverage information.     Covid-19: Done 09/30/2019 & 10/27/2019  Advanced directives: Please bring a copy of your health care power of attorney and living will to the office to be added to your chart at your convenience.   Conditions/risks identified: Aim for 30 minutes of exercise or brisk walking each day, drink 6-8 glasses of water and eat lots of fruits and vegetables.   Next appointment: Follow up in one year for your annual wellness visit.   Preventive Care 26 Years and Older, Male  Preventive care refers to lifestyle choices and visits with your health care provider that can promote health and wellness. What does preventive care include? A yearly physical exam. This is also called an annual well check. Dental exams once or twice a year. Routine eye exams. Ask your health care provider how often you should have your eyes checked. Personal lifestyle choices, including: Daily care of your teeth and gums. Regular physical activity. Eating a healthy diet. Avoiding tobacco and drug use. Limiting alcohol use. Practicing safe sex. Taking low doses of aspirin every day. Taking vitamin and mineral supplements as recommended by your health care provider. What happens during an annual well check? The services and screenings  done by your health care provider during your annual well check will depend on your age, overall health, lifestyle risk factors, and family history of disease. Counseling  Your health care provider may ask you questions about your: Alcohol use. Tobacco use. Drug use. Emotional well-being. Home and relationship well-being. Sexual activity. Eating habits. History of falls. Memory and ability to understand (cognition). Work and work Statistician. Screening  You may have the following tests or measurements: Height, weight, and BMI. Blood pressure. Lipid and cholesterol levels. These may be checked every 5 years, or more frequently if you are over 81 years old. Skin check. Lung cancer screening. You may have this screening every year starting at age 56 if you have a 30-pack-year history of smoking and currently smoke or have quit within the past 15 years. Fecal occult blood test (FOBT) of the stool. You may have this test every year starting at age 58. Flexible sigmoidoscopy or colonoscopy. You may have a sigmoidoscopy every 5 years or a colonoscopy every 10 years starting at age 63. Prostate cancer screening. Recommendations will vary depending on your family history and other risks. Hepatitis C blood test. Hepatitis B blood test. Sexually transmitted disease (STD) testing. Diabetes screening. This is done by checking your blood sugar (glucose) after you have not eaten for a while (fasting). You may have this done every 1-3 years. Abdominal aortic aneurysm (AAA) screening. You may need this if you are a current or former smoker. Osteoporosis. You may be screened starting at age 47 if you are at high risk. Talk with your health care provider about your test results, treatment options, and if necessary, the need for more tests. Vaccines  Your health care provider may recommend certain vaccines, such as: Influenza vaccine. This is recommended every year. Tetanus, diphtheria, and acellular  pertussis (Tdap, Td) vaccine. You may need a Td booster every 10 years. Zoster vaccine. You may need this after age 7. Pneumococcal 13-valent conjugate (PCV13) vaccine. One dose is recommended after age 84. Pneumococcal polysaccharide (PPSV23) vaccine. One dose is recommended after age 65. Talk to your health care provider about which screenings and vaccines you need and how often you need them. This information is not intended to replace advice given to you by your health care provider. Make sure you discuss any questions you have with your health care provider. Document Released: 07/01/2015 Document Revised: 02/22/2016 Document Reviewed: 04/05/2015 Elsevier Interactive Patient Education  2017 [place] Prevention in the Home Falls can cause injuries. They can happen to people of all ages. There are many things you can do to make your home safe and to help prevent falls. What can I do on the outside of my home? Regularly fix the edges of walkways and driveways and fix any cracks. Remove anything that might make you trip as you walk through a door, such as a raised step or threshold. Trim any bushes or trees on the path to your home. Use bright outdoor lighting. Clear any walking paths of anything that might make someone trip, such as rocks or tools. Regularly check to see if handrails are loose or broken. Make sure that both sides of any steps have handrails. Any raised decks and porches should have guardrails on the edges. Have any leaves, snow, or ice cleared regularly. Use sand or salt on walking paths during winter. Clean up any spills in your garage right away. This includes oil or grease spills. What can I do in the bathroom? Use night lights. Install grab bars by the toilet and in the tub and shower. Do not use towel bars as grab bars. Use non-skid mats or decals in the tub or shower. If you need to sit down in the shower, use a plastic, non-slip stool. Keep the floor  dry. Clean up any water that spills on the floor as soon as it happens. Remove soap buildup in the tub or shower regularly. Attach bath mats securely with double-sided non-slip rug tape. Do not have throw rugs and other things on the floor that can make you trip. What can I do in the bedroom? Use night lights. Make sure that you have a light by your bed that is easy to reach. Do not use any sheets or blankets that are too big for your bed. They should not hang down onto the floor. Have a firm chair that has side arms. You can use this for support while you get dressed. Do not have throw rugs and other things on the floor that can make you trip. What can I do in the kitchen? Clean up any spills right away. Avoid walking on wet floors. Keep items that you use a lot in easy-to-reach places. If you need to reach something above you, use a strong step stool that has a grab bar. Keep electrical cords out of the way. Do not use floor polish or wax that makes floors slippery. If you must use wax, use non-skid floor wax. Do not have throw rugs and other things on the floor that can make you trip. What can I do with my stairs? Do not leave any items on the stairs. Make sure that there are  handrails on both sides of the stairs and use them. Fix handrails that are broken or loose. Make sure that handrails are as long as the stairways. Check any carpeting to make sure that it is firmly attached to the stairs. Fix any carpet that is loose or worn. Avoid having throw rugs at the top or bottom of the stairs. If you do have throw rugs, attach them to the floor with carpet tape. Make sure that you have a light switch at the top of the stairs and the bottom of the stairs. If you do not have them, ask someone to add them for you. What else can I do to help prevent falls? Wear shoes that: Do not have high heels. Have rubber bottoms. Are comfortable and fit you well. Are closed at the toe. Do not wear  sandals. If you use a stepladder: Make sure that it is fully opened. Do not climb a closed stepladder. Make sure that both sides of the stepladder are locked into place. Ask someone to hold it for you, if possible. Clearly mark and make sure that you can see: Any grab bars or handrails. First and last steps. Where the edge of each step is. Use tools that help you move around (mobility aids) if they are needed. These include: Canes. Walkers. Scooters. Crutches. Turn on the lights when you go into a dark area. Replace any light bulbs as soon as they burn out. Set up your furniture so you have a clear path. Avoid moving your furniture around. If any of your floors are uneven, fix them. If there are any pets around you, be aware of where they are. Review your medicines with your doctor. Some medicines can make you feel dizzy. This can increase your chance of falling. Ask your doctor what other things that you can do to help prevent falls. This information is not intended to replace advice given to you by your health care provider. Make sure you discuss any questions you have with your health care provider. Document Released: 03/31/2009 Document Revised: 11/10/2015 Document Reviewed: 07/09/2014 Elsevier Interactive Patient Education  2017 [employer].

## 2021-03-17 NOTE — Progress Notes (Signed)
Subjective:   [name] is a 80 y.o. male who presents for Medicare Annual/Subsequent preventive examination.  Virtual Visit via Telephone Note  I connected with  [name] on 03/17/21 at  4:15 PM EDT by telephone and verified that I am speaking with the correct person using two identifiers.  Location: Patient: Home Provider: [facility] Persons participating in the virtual visit: patient/Nurse Health Advisor   I discussed the limitations, risks, security and privacy concerns of performing an evaluation and management service by telephone and the availability of in person appointments. The patient expressed understanding and agreed to proceed.  Interactive audio and video telecommunications were attempted between this nurse and patient, however failed, due to patient having technical difficulties OR patient did not have access to video capability.  We continued and completed visit with audio only.  Some vital signs may be absent or patient reported.   Promiss Labarbera E Beverely Suen, LPN   Review of Systems     Cardiac Risk Factors include: advanced age (>55men, >29 women);dyslipidemia;male gender;sedentary lifestyle;Other (see comment), Risk factor comments: COPD, A.Fib     Objective:    Today's Vitals   03/17/21 1617  Weight: 176 lb (79.8 kg)  Height: 5\' 6"  (1.676 m)   Body mass index is 28.41 kg/m.  Advanced Directives 03/17/2021 07/01/2020 06/29/2020 06/27/2020 04/10/2019 08/18/2018  Does Patient Have a Medical Advance Directive? Yes Yes Yes No Yes No  Type of Paramedic of [place];Living will Healthcare Power of [place];Living will -  Does patient want to make changes to medical advance directive? - No - Patient declined - - No - Patient declined -  Copy of [place] in Chart? No - copy requested No - copy requested No - copy requested - No - copy requested -  Would patient like  information on creating a medical advance directive? - No - Patient declined - No - Patient declined - No - Patient declined    Current Medications (verified) Outpatient Encounter Medications as of 03/17/2021  Medication Sig   albuterol (PROVENTIL) (2.5 MG/3ML) 0.083% nebulizer solution Take 3 mLs (2.5 mg total) by nebulization every 6 (six) hours as needed for wheezing or shortness of breath.   albuterol (VENTOLIN HFA) 108 (90 Base) MCG/ACT inhaler Inhale 2 puffs into the lungs every 6 (six) hours as needed for wheezing or shortness of breath.   ARTIFICIAL TEAR OP Place 1 drop into the left eye at bedtime.   Budeson-Glycopyrrol-Formoterol (BREZTRI AEROSPHERE) 160-9-4.8 MCG/ACT AERO Inhale 2 puffs into the lungs in the morning and at bedtime.   Evolocumab (REPATHA SURECLICK) 878 MG/ML SOAJ Inject 140 mg into the skin every 14 (fourteen) days.   ketorolac (ACULAR) 0.5 % ophthalmic solution Place 1 drop into the left eye 4 (four) times daily.   meclizine (ANTIVERT) 25 MG tablet meclizine 25 mg tablet   omeprazole (PRILOSEC) 20 MG capsule TAKE 1 CAPSULE EVERY DAY   traZODone (DESYREL) 50 MG tablet Take 1 tablet (50 mg total) by mouth at bedtime as needed for sleep.   Vitamin D, Ergocalciferol, (DRISDOL) 1.25 MG (50000 UNIT) CAPS capsule TAKE 1 CAPSULE EVERY 7 DAYS.   warfarin (COUMADIN) 5 MG tablet TAKE 1 TABLET DAILY OR AS DIRECTED   [DISCONTINUED] metoprolol tartrate (LOPRESSOR) 25 MG tablet TAKE 1 AND 1/2 TABLETS TWICE DAILY ( NEW DOSE )   No facility-administered encounter medications on file as of 03/17/2021.    Allergies (  verified) Asa [aspirin], Niacin, Pravastatin, Rosuvastatin, and Simvastatin   History: Past Medical History:  Diagnosis Date   Anxiety disorder    Benign prostatic hypertrophy    Bilateral cataracts    CAD (coronary artery disease)    Catheterization 2009, mild CAD   Carotid artery disease ([place])    Doppler, September, 2011, less than 50% bilateral   COPD (chronic  obstructive pulmonary disease) ([place])    CVA (cerebral infarction)    Hospitalization.  MRI...    Left anterior cerebral artery territory, September, 2011... Coumadin started   DYSLIPIDEMIA 11/01/2009   Qualifier: Diagnosis of  By: Ron Parker, MD, [facility], [place]    Ejection fraction    EF 60%   Ejection fraction    EF 55-60%, echo, September, 2011   Gastroesophageal reflux disease    Hyperlipidemia    Persistent atrial fibrillation (HCC)        Ptosis of eyelid, left    Surgery in the past   Sweating abnormality    Episodes of sweating appeared to be related to a delayed reaction to niacin   Vitamin D insufficiency    Past Surgical History:  Procedure Laterality Date   APPENDECTOMY     CATARACT EXTRACTION, BILATERAL     INGUINAL HERNIA REPAIR Left 07/02/2019   INGUINAL HERNIA REPAIR Left 07/01/2020   Procedure: LEFT INGUINAL HERNIA REPAIR WITH MESH;  Surgeon: Virl Cagey, MD;  Location: [facility];  Service: General;  Laterality: Left;   KNEE SURGERY Right 2019   torn ligament?    TRANSURETHRAL RESECTION OF PROSTATE N/A 08/19/2018   Procedure: TRANSURETHRAL RESECTION OF THE PROSTATE (TURP) OF PROSTATE ABCESS;  Surgeon: Alexis Frock, MD;  Location: [facility];  Service: Urology;  Laterality: N/A;   Family History  Problem Relation Age of Onset   Heart attack Mother    Other Other        Insignificant for premature CAD   Heart disease Sister    Hyperlipidemia Sister    Kidney disease Son    Heart attack Paternal Grandfather    Social History   Socioeconomic History   Marital status: Married    Spouse name: shirley   Number of children: 5   Years of education: 11   Highest education level: 11th grade  Occupational History   Occupation: Retired Statistician   Tobacco Use   Smoking status: Former    Packs/day: 3.00    Years: 50.00    Pack years: 150.00    Types: Cigarettes    Quit date: 03/18/1990    Years since quitting: 31.0   Smokeless tobacco: Former    Types:  Chew    Quit date: 06/18/2000  Vaping Use   Vaping Use: Never used  Substance and Sexual Activity   Alcohol use: No    Alcohol/week: 0.0 standard drinks   Drug use: No   Sexual activity: Not Currently  Other Topics Concern   Not on file  Social History Narrative   Quit smoking in 1993. No significant alcohol abuse. Retired from trucking.    Social Determinants of Health   Financial Resource Strain: Low Risk    Difficulty of Paying Living Expenses: Not very hard  Food Insecurity: No Food Insecurity   Worried About Charity fundraiser in the Last Year: Never true   Ran Out of Food in the Last Year: Never true  Transportation Needs: No Transportation Needs   Lack of Transportation (Medical): No   Lack of Transportation (Non-Medical):  No  Physical Activity: Insufficiently Active   Days of Exercise per Week: 3 days   Minutes of Exercise per Session: 30 min  Stress: No Stress Concern Present   Feeling of Stress : Not at all  Social Connections: Socially Integrated   Frequency of Communication with Friends and Family: More than three times a week   Frequency of Social Gatherings with Friends and Family: More than three times a week   Attends Religious Services: More than 4 times per year   Active Member of [employer] or Organizations: Yes   Attends Music therapist: More than 4 times per year   Marital Status: Married    Tobacco Counseling Counseling given: Not Answered   Clinical Intake:  Pre-visit preparation completed: Yes  Pain : No/denies pain     BMI - recorded: 28.41 Nutritional Status: BMI 25 -29 Overweight Nutritional Risks: None Diabetes: No  How often do you need to have someone help you when you read instructions, pamphlets, or other written materials from your doctor or pharmacy?: 1 - Never  Diabetic? no  Interpreter Needed?: No  Information entered by :: Eliud Polo, LPN   Activities of Daily Living In your present state of health, do you  have any difficulty performing the following activities: 03/17/2021 06/29/2020  Hearing? N N  Vision? N N  Difficulty concentrating or making decisions? N N  Walking or climbing stairs? N N  Dressing or bathing? N N  Doing errands, shopping? N N  Preparing Food and eating ? N -  Using the Toilet? N -  In the past six months, have you accidently leaked urine? N -  Do you have problems with loss of bowel control? N -  Managing your Medications? N -  Managing your Finances? N -  Housekeeping or managing your Housekeeping? N -  Some recent data might be hidden    Patient Care Team: Loman Brooklyn, FNP as PCP - General (Family Medicine) Harl Bowie Alphonse Guild, MD as PCP - Cardiology (Cardiology) Harl Bowie Alphonse Guild, MD as Consulting Physician (Cardiology) Irine Seal, MD as Attending Physician (Urology) Lavera Guise, [facility] (Pharmacist)  Indicate any recent Medical Services you may have received from other than [facility] providers in the past year (date may be approximate).     Assessment:   This is a routine wellness examination for [place].  Hearing/Vision screen Hearing Screening - Comments:: Denies hearing difficulties  Vision Screening - Comments:: Wears rx glasses for reading prn only - up to date with annual eye exams with Dr Katy Fitch.  Dietary issues and exercise activities discussed: Current Exercise Habits: Home exercise routine, Type of exercise: walking, Time (Minutes): 30, Frequency (Times/Week): 3, Weekly Exercise (Minutes/Week): 90, Intensity: Mild, Exercise limited by: cardiac condition(s);respiratory conditions(s)   Goals Addressed             This Visit's Progress    DIET - INCREASE WATER INTAKE   On track    Try to drink 6-8 glasses of water daily.       Depression Screen PHQ 2/9 Scores 02/17/2021 02/13/2021 11/08/2020 09/02/2020 08/05/2020 02/12/2020 11/09/2019  PHQ - 2 Score 0 0 0 0 0 0 0  PHQ- 9 Score 0 0 3 - - - 3    Fall Risk Fall Risk  03/17/2021 02/17/2021 02/13/2021  11/08/2020 09/02/2020  Falls in the past year? 0 0 0 0 0  Number falls in past yr: 0 - - - -  Injury with Fall? 0 - - - -  Risk for fall due to : No Fall Risks - - - -  Follow up Falls prevention discussed - - - -  Comment - - - - -    FALL RISK PREVENTION PERTAINING TO THE HOME:  Any stairs in or around the home? No  If so, are there any without handrails? No  Home free of loose throw rugs in walkways, pet beds, electrical cords, etc? Yes  Adequate lighting in your home to reduce risk of falls? Yes   ASSISTIVE DEVICES UTILIZED TO PREVENT FALLS:  Life alert? No  Use of a cane, walker or w/c? No  Grab bars in the bathroom? Yes  Shower chair or bench in shower? Yes  Elevated toilet seat or a handicapped toilet? Yes   TIMED UP AND GO:  Was the test performed? No . Telephonic visit.  Cognitive Function: Normal cognitive status assessed by direct observation by this Nurse Health Advisor. No abnormalities found.      6CIT Screen 04/10/2019  What Year? 0 points  What month? 0 points  What time? 0 points  Count back from 20 0 points  Months in reverse 0 points  Repeat phrase 2 points  Total Score 2    Immunizations Immunization History  Administered Date(s) Administered   Fluad Quad(high Dose 65+) 03/18/2020   Influenza Whole 03/18/2012   Influenza, High Dose Seasonal PF 03/17/2019   Influenza,inj,Quad PF,6+ Mos 04/02/2017, 03/17/2018   Influenza-Unspecified 03/17/2019, 03/17/2021   Moderna Sars-Covid-2 Vaccination 09/30/2019, 10/27/2019   Pneumococcal Conjugate-13 12/17/2013   Pneumococcal Polysaccharide-23 03/18/2005    TDAP status: Due, Education has been provided regarding the importance of this vaccine. Advised may receive this vaccine at local pharmacy or Health Dept. Aware to provide a copy of the vaccination record if obtained from local pharmacy or Health Dept. Verbalized acceptance and understanding.  Flu Vaccine status: Due, Education has been provided  regarding the importance of this vaccine. Advised may receive this vaccine at local pharmacy or Health Dept. Aware to provide a copy of the vaccination record if obtained from local pharmacy or Health Dept. Verbalized acceptance and understanding.  Pneumococcal vaccine status: Up to date  Covid-19 vaccine status: Information provided on how to obtain vaccines.   Qualifies for Shingles Vaccine? Yes   Zostavax completed No   Shingrix Completed?: No.    Education has been provided regarding the importance of this vaccine. Patient has been advised to call insurance company to determine out of pocket expense if they have not yet received this vaccine. Advised may also receive vaccine at local pharmacy or Health Dept. Verbalized acceptance and understanding.  Screening Tests Health Maintenance  Topic Date Due   Zoster Vaccines- Shingrix (1 of 2) 05/19/2021 (Originally 10/25/1988)   TETANUS/TDAP  08/05/2021 (Originally 10/25/1959)   COVID-19 Vaccine (3 - Booster for Moderna series) 03/01/2022 (Originally 03/28/2020)   INFLUENZA VACCINE  Completed   HPV VACCINES  Aged Out    Health Maintenance  There are no preventive care reminders to display for this patient.  Colorectal cancer screening: No longer required.   Lung Cancer Screening: (Low Dose CT Chest recommended if Age 1-80 years, 30 pack-year currently smoking OR have quit w/in 15years.) does not qualify.   Additional Screening:  Hepatitis C Screening: does not qualify  Vision Screening: Recommended annual ophthalmology exams for early detection of glaucoma and other disorders of the eye. Is the patient up to date with their annual eye exam?  Yes  Who is the provider or what  is the name of the office in which the patient attends annual eye exams? [facility] If pt is not established with a provider, would they like to be referred to a provider to establish care? No .   Dental Screening: Recommended annual dental exams for proper oral  hygiene  Community Resource Referral / Chronic Care Management: CRR required this visit?  No   CCM required this visit?  No      Plan:     I have personally reviewed and noted the following in the patient's chart:   Medical and social history Use of alcohol, tobacco or illicit drugs  Current medications and supplements including opioid prescriptions. Patient is not currently taking opioid prescriptions. Functional ability and status Nutritional status Physical activity Advanced directives List of other physicians Hospitalizations, surgeries, and ER visits in previous 12 months Vitals Screenings to include cognitive, depression, and falls Referrals and appointments  In addition, I have reviewed and discussed with patient certain preventive protocols, quality metrics, and best practice recommendations. A written personalized care plan for preventive services as well as general preventive health recommendations were provided to patient.     Sandrea Hammond, LPN   [id number]   Nurse Notes: None

## 2021-03-17 NOTE — Progress Notes (Signed)
Clinical Summary [name] is a 80 y.o.male seen today for follow up of the following medical problems.      1. Chronic afib -  DOACs were too expensive in the past, he has elected to remain on coumadin.  - recent admission with a febrile illness, rigors at [name]. Caused his afib rates to elevate in the hospital, lopressor was increased to 100mg  bid. Took a few days after discharge, but starting causing significant fatigue.  - he lowered lopressor back to 50mg  bid. Symptoms have improved.    - seen by EP, did not meet criteria to consider watchman device   - no palpitations - compliant with meds - no bleeding on coumadin.      2. Carotid stenosis - 02/2015 carotid US: bilateral 1-39% ICA disease.   - 10/2020 mild bilateral dises   3. Hyperlipidemia - joint pains on simvaststin, atorvastatin, rosuvastatin, pravastatin. 01/2020 TC 224 TG 112 HDL 36 LDL 168   - he is on repatha, followed lipid clinic - LDL down from 168 to 71  02/2021 TC 122 TG 145 HDL 34 LDL 63     4. CAD - mild disease by cath 2009 - - no chest pains   5. LE edema - reports singificant bilateral leg edema over the last few weeks. Denies any SOB or orthopnea. Weight is up 6 lbs since 04/2018.  - Jan 2020 LVEF 60-65%, cannot eval diastolic function due to afib but sever biatrial enlargements is suggestive.    - mild swelling at times.      6. Aortic regurgitation - moderate AI by echo Jan 2020 - 10/2020 echo mild AI    7. 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 DVT  - 07/2020 [place] with deep vein thrombosis involving the left common femoral vein, femoral vein, popliteal vein and profundus femoral vein. - occurred after left inguinal hernia repair. Note coumadin was held in periop period, prior to that his INR had been subtherapeutic.    -if needs to be off in the future would consider bridging.  - he reports pcp working on [name] assistance. Reports a copay of 30-40$ dollars would  be too much   9. Chronic fatigue  10. Elevated bp - homes 130s/60s  SH: retired Forensic psychologist. 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 [place] Past Medical History:  Diagnosis Date   Anxiety disorder    Benign prostatic hypertrophy    Bilateral cataracts    CAD (coronary artery disease)    Catheterization 2009, mild CAD   Carotid artery disease (HCC)    Doppler, September, 2011, less than 50% bilateral   COPD (chronic obstructive pulmonary disease) (HCC)    CVA (cerebral infarction)    Hospitalization.  MRI...    Left anterior cerebral artery territory, September, 2011... Coumadin started   DYSLIPIDEMIA 11/01/2009   Qualifier: Diagnosis of  By: Sean Parker, [name], [name], [name]    Ejection fraction    EF 60%   Ejection fraction    EF 55-60%, echo, September, 2011   Gastroesophageal reflux disease    Hyperlipidemia    Persistent atrial fibrillation (HCC)        Ptosis of eyelid, left    Surgery in the past   Sweating abnormality    Episodes of sweating appeared to be related to a delayed reaction to niacin   Vitamin D insufficiency      Allergies  Allergen Reactions   Diona Fanti [  Aspirin]     GI upset   Niacin     hot flashes   Pravastatin     myalgias   Rosuvastatin     myalgias   Simvastatin     Myalgias      Current Outpatient Medications  Medication Sig Dispense Refill   albuterol (PROVENTIL) (2.5 MG/3ML) 0.083% nebulizer solution Take 3 mLs (2.5 mg total) by nebulization every 6 (six) hours as needed for wheezing or shortness of breath. 72 mL 0   albuterol (VENTOLIN HFA) 108 (90 Base) MCG/ACT inhaler Inhale 2 puffs into the lungs every 6 (six) hours as needed for wheezing or shortness of breath. 18 g 11   ARTIFICIAL TEAR OP Place 1 drop into the left eye at bedtime.     Budeson-Glycopyrrol-Formoterol (BREZTRI AEROSPHERE) 160-9-4.8 MCG/ACT AERO Inhale 2 puffs into the lungs in the morning and at bedtime. 10.7 g 12   Evolocumab (REPATHA SURECLICK) 784  MG/ML SOAJ Inject 140 mg into the skin every 14 (fourteen) days. 2 mL 11   ketorolac (ACULAR) 0.5 % ophthalmic solution Place 1 drop into the left eye 4 (four) times daily. 5 mL 2   metoprolol tartrate (LOPRESSOR) 25 MG tablet TAKE 1 AND 1/2 TABLETS TWICE DAILY ( NEW DOSE ) 270 tablet 3   omeprazole (PRILOSEC) 20 MG capsule TAKE 1 CAPSULE EVERY DAY 90 capsule 1   traZODone (DESYREL) 50 MG tablet Take 1 tablet (50 mg total) by mouth at bedtime as needed for sleep. 90 tablet 1   Vitamin D, Ergocalciferol, (DRISDOL) 1.25 MG (50000 UNIT) CAPS capsule TAKE 1 CAPSULE EVERY 7 DAYS. 12 capsule 1   warfarin (COUMADIN) 5 MG tablet TAKE 1 TABLET DAILY OR AS DIRECTED 90 tablet 3   No current facility-administered medications for this visit.     Past Surgical History:  Procedure Laterality Date   APPENDECTOMY     CATARACT EXTRACTION, BILATERAL     INGUINAL HERNIA REPAIR Left 07/02/2019   INGUINAL HERNIA REPAIR Left 07/01/2020   Procedure: LEFT INGUINAL HERNIA REPAIR WITH MESH;  Surgeon: Sean Cagey, [name];  Location: [facility];  Service: General;  Laterality: Left;   KNEE SURGERY Right 2019   torn ligament?    TRANSURETHRAL RESECTION OF PROSTATE N/A 08/19/2018   Procedure: TRANSURETHRAL RESECTION OF THE PROSTATE (TURP) OF PROSTATE ABCESS;  Surgeon: Sean Frock, [name];  Location: [facility];  Service: Urology;  Laterality: N/A;     Allergies  Allergen Reactions   Asa [Aspirin]     GI upset   Niacin     hot flashes   Pravastatin     myalgias   Rosuvastatin     myalgias   Simvastatin     Myalgias       Family History  Problem Relation Age of Onset   Heart attack Mother    Other Other        Insignificant for premature CAD   Heart disease Sister    Hyperlipidemia Sister    Kidney disease Son    Heart attack Paternal Grandfather      Social History [name] reports that he quit smoking about 31 years ago. His smoking use included cigarettes. He has a 150.00 pack-year smoking  history. He quit smokeless tobacco use about 20 years ago.  His smokeless tobacco use included chew. [name] reports no history of alcohol use.   Review of Systems CONSTITUTIONAL: chronic fatigue HEENT: Eyes: No visual loss, blurred vision, double vision or yellow sclerae.No hearing  loss, sneezing, congestion, runny nose or sore throat.  SKIN: No rash or itching.  CARDIOVASCULAR: per hpi RESPIRATORY: No shortness of breath, cough or sputum.  GASTROINTESTINAL: No anorexia, nausea, vomiting or diarrhea. No abdominal pain or blood.  GENITOURINARY: No burning on urination, no polyuria NEUROLOGICAL:chronic dizziness MUSCULOSKELETAL: No muscle, back pain, joint pain or stiffness.  LYMPHATICS: No enlarged nodes. No history of splenectomy.  PSYCHIATRIC: No history of depression or anxiety.  ENDOCRINOLOGIC: No reports of sweating, cold or heat intolerance. No polyuria or polydipsia.  Marland Kitchen   Physical Examination Today's Vitals   03/17/21 1431  BP: (!) 142/62  Pulse: 71  SpO2: 98%  Weight: 178 lb 9.6 oz (81 kg)  Height: 5\' 6"  (1.676 m)   Body mass index is 28.83 kg/m.  Gen: resting comfortably, no acute distress HEENT: no scleral icterus, pupils equal round and reactive, no palptable cervical adenopathy,  CV: RRR, no m/r/g no jvd Resp: Clear to auscultation bilaterally GI: abdomen is soft, non-tender, non-distended, normal bowel sounds, no hepatosplenomegaly MSK: extremities are warm, no edema.  Skin: warm, no rash Neuro:  no focal deficits Psych: appropriate affect   Diagnostic Studies   02/2015 carotid US: bilateral 1-39% ICA disease. Recs for f/u 2 years   02/2008 cath FINDINGS:  The aortic pressure 98/71 with a mean of 83, left ventricular  pressure 99/20.    Coronary angiography.  The left mainstem is very short.  There is no  stenosis present.    The LAD is a large-caliber vessel that courses down and reaches the LV  apex.  The anatomy is a bit unusual as there is a  Masiya Claassen off the  proximal LAD that runs the course of an intermediate.  It supplies  multiple Hasina Kreager vessels to the lateral wall.  There has been more of a  traditional appearing diagonal Orissa Arreaga from further down in the mid-LAD.  The LAD at the origin of the second diagonal Annalucia Laino has a 30% stenosis.  Further down in the vessel, there is minor nonobstructive plaque but no  significant stenosis in the LAD or its Patrizia Paule vessels.  There are two  diagonals in the intermediate territory Roshunda Keir but all arising from the  proximal and mid LAD.    The left circumflex is very small.  It courses down the AV groove and  supplies no significant Rowyn Spilde vessels.    Right coronary artery.  The right coronary artery is dominant.  Proximally, there is an eccentric 50% stenosis.  This is a smooth  appearing lesion of the vessels.  The midportion of the right coronary  artery is dilated.  There is a transition zone into more normal-  appearing vessel where there is a 30% stenosis.  There is a large acute  marginal Carleta Woodrow and a moderate size PDA and posterolateral Merridith Dershem.  There are no other significant stenoses noted.  With intracoronary  nitroglycerin, the lesion still appears no worse than 50%.    Left ventriculography shows low normal LV function with an LVEF in the  range of 50-55%.  There was no significant mitral regurgitation.    ASSESSMENT:  1. Mild-to-moderate right coronary artery stenosis as outlined above.  2. Minimal left anterior descending and left circumflex stenosis.  3. Normal left ventricular function.    Post cat PLAN:  Recommend medical therapy for nonobstructive CAD.  The patient  will resume Coumadin tonight.  He will drop his aspirin dose to 81 mg.  In reviewing Dr. Ron [name] notes, the patient  may not require long-term  Coumadin.  Recommend aggressive secondary risk reduction for the  patient's diffuse nonobstructive CAD.     08/2016 event monitor Telemetry tracings show rate  controlled atrial fibrillation No symptoms reported    10/2020 echo IMPRESSIONS     1. Left ventricular ejection fraction, by estimation, is 60 to 65%. The  left ventricle has normal function. The left ventricle has no regional  wall motion abnormalities. Left ventricular diastolic parameters are  indeterminate. Elevated left atrial  pressure.   2. Right ventricular systolic function is normal. The right ventricular  size is normal.   3. Left atrial size was severely dilated.   4. Right atrial size was severely dilated.   5. The mitral valve is normal in structure. Trivial mitral valve  regurgitation. No evidence of mitral stenosis.   6. The aortic valve has an indeterminant number of cusps. There is mild  calcification of the aortic valve. There is mild thickening of the aortic  valve. Aortic valve regurgitation is mild. No aortic stenosis is present.   7. The inferior vena cava is normal in size with greater than 50%  respiratory variability, suggesting right atrial pressure of 3 mmHg.     Assessment and Plan  1. Chronic afib - DOACs have been too expensive, he prefers staying on coumadin - no symptoms,continue current meds - reports chronic fatigue, unclear if related to lopressor, try lowering to 25mg  bid.    2. CAD - mild disease on prior cath - no symptoms, continue current meds   3. Carotid stenosis - mild disease by recent US, continue to monitor   4. Aortic regurgitation - mild by recent echo, continue to monitor  5. HYperlipidemia - intolerant to multiple statins - doing great on repatha, LDL is at goal. Continue current therapy      Arnoldo Lenis, M.D.

## 2021-03-28 ENCOUNTER — Ambulatory Visit (INDEPENDENT_AMBULATORY_CARE_PROVIDER_SITE_OTHER): Payer: Medicare Other | Admitting: *Deleted

## 2021-03-28 DIAGNOSIS — I4891 Unspecified atrial fibrillation: Secondary | ICD-10-CM | POA: Diagnosis not present

## 2021-03-28 DIAGNOSIS — I482 Chronic atrial fibrillation, unspecified: Secondary | ICD-10-CM | POA: Diagnosis not present

## 2021-03-28 DIAGNOSIS — Z5181 Encounter for therapeutic drug level monitoring: Secondary | ICD-10-CM

## 2021-03-28 LAB — POCT INR: INR: 1.7 — AB (ref 2.0–3.0)

## 2021-03-28 NOTE — Patient Instructions (Signed)
Take warfarin 1 1/2 tablets tonight, 1 tablet tomorrow night then resume 1 tablet daily except 1/2 tablet on Sundays and Wednesdays Recheck in 3 wks 

## 2021-03-29 ENCOUNTER — Ambulatory Visit (INDEPENDENT_AMBULATORY_CARE_PROVIDER_SITE_OTHER): Payer: Medicare Other | Admitting: Family Medicine

## 2021-03-29 ENCOUNTER — Encounter: Payer: Self-pay | Admitting: Family Medicine

## 2021-03-29 ENCOUNTER — Other Ambulatory Visit: Payer: Self-pay

## 2021-03-29 VITALS — BP 144/72 | HR 83 | Temp 97.0°F | Ht 66.0 in | Wt 178.4 lb

## 2021-03-29 DIAGNOSIS — M545 Low back pain, unspecified: Secondary | ICD-10-CM

## 2021-03-29 MED ORDER — METHYLPREDNISOLONE ACETATE 80 MG/ML IJ SUSP
80.0000 mg | Freq: Once | INTRAMUSCULAR | Status: AC
  Administered 2021-03-29: 80 mg via INTRAMUSCULAR

## 2021-03-29 MED ORDER — PREDNISONE 10 MG (21) PO TBPK: ORAL_TABLET | ORAL | 0 refills | Status: DC

## 2021-03-29 NOTE — Progress Notes (Signed)
Assessment & Plan:  1. Acute left-sided low back pain without sciatica - encouraged use of tylenol, heat packs, and rubs to help with pain - education provided on low back pain - methylPREDNISolone acetate (DEPO-MEDROL) injection 80 mg - predniSONE (STERAPRED UNI-PAK 21 TAB) 10 MG (21) TBPK tablet; As directed x 6 days. Start tomorrow.  Dispense: 21 tablet; Refill: 0   Follow up plan: Return if symptoms worsen or fail to improve.  Lucile Crater, NP Student  I personally was present during the history, physical exam, and medical decision-making activities of this service and have verified that the service and findings are accurately documented in the nurse practitioner student's note.  [name], MSN, APRN, FNP-C [facility]   Subjective:   Patient ID: [name], male    DOB: 1940-06-21, 80 y.o.   MRN: [id number]  HPI: [name] is a 80 y.o. male presenting on 03/29/2021 for Back Pain (Lower back pain x 1 week )   He states he was working on his tractor for several days requiring bending over and lifting. He states the pain has been present for a week. He has tried tylenol and heat packs which did not help. He states the pain is worse on his left side than his right, and worsens with bending and twisting. He denies pain radiating down his legs.    ROS: Negative unless specifically indicated above in HPI.   Relevant past medical history reviewed and updated as indicated.   Allergies and medications reviewed and updated.   Current Outpatient Medications:    albuterol (PROVENTIL) (2.5 MG/3ML) 0.083% nebulizer solution, Take 3 mLs (2.5 mg total) by nebulization every 6 (six) hours as needed for wheezing or shortness of breath., Disp: 72 mL, Rfl: 0   albuterol (VENTOLIN HFA) 108 (90 Base) MCG/ACT inhaler, Inhale 2 puffs into the lungs every 6 (six) hours as needed for wheezing or shortness of breath., Disp: 18 g, Rfl: 11   ARTIFICIAL TEAR OP,  Place 1 drop into the left eye at bedtime., Disp: , Rfl:    Budeson-Glycopyrrol-Formoterol (BREZTRI AEROSPHERE) 160-9-4.8 MCG/ACT AERO, Inhale 2 puffs into the lungs in the morning and at bedtime., Disp: 10.7 g, Rfl: 12   Evolocumab (REPATHA SURECLICK) 409 MG/ML SOAJ, Inject 140 mg into the skin every 14 (fourteen) days., Disp: 2 mL, Rfl: 11   ketorolac (ACULAR) 0.5 % ophthalmic solution, Place 1 drop into the left eye 4 (four) times daily., Disp: 5 mL, Rfl: 2   meclizine (ANTIVERT) 25 MG tablet, meclizine 25 mg tablet, Disp: , Rfl:    metoprolol tartrate (LOPRESSOR) 25 MG tablet, Take 1 tablet (25 mg total) by mouth 2 (two) times daily., Disp: , Rfl:    omeprazole (PRILOSEC) 20 MG capsule, TAKE 1 CAPSULE EVERY DAY, Disp: 90 capsule, Rfl: 1   predniSONE (STERAPRED UNI-PAK 21 TAB) 10 MG (21) TBPK tablet, As directed x 6 days. Start tomorrow., Disp: 21 tablet, Rfl: 0   traZODone (DESYREL) 50 MG tablet, Take 1 tablet (50 mg total) by mouth at bedtime as needed for sleep., Disp: 90 tablet, Rfl: 1   Vitamin D, Ergocalciferol, (DRISDOL) 1.25 MG (50000 UNIT) CAPS capsule, TAKE 1 CAPSULE EVERY 7 DAYS., Disp: 12 capsule, Rfl: 1   warfarin (COUMADIN) 5 MG tablet, TAKE 1 TABLET DAILY OR AS DIRECTED, Disp: 90 tablet, Rfl: 3  Current Facility-Administered Medications:    methylPREDNISolone acetate (DEPO-MEDROL) injection 80 mg, 80 mg, Intramuscular, Once, Loman Brooklyn, FNP  Allergies  Allergen Reactions   Asa [Aspirin]     GI upset   Niacin     hot flashes   Pravastatin     myalgias   Rosuvastatin     myalgias   Simvastatin     Myalgias     Objective:   BP (!) 144/72   Pulse 83   Temp (!) 97 F (36.1 C) (Temporal)   Ht 5\' 6"  (1.676 m)   Wt 80.9 kg   SpO2 95%   BMI 28.79 kg/m    Physical Exam Vitals reviewed.  Constitutional:      General: He is not in acute distress.    Appearance: Normal appearance. He is normal weight. He is not ill-appearing, toxic-appearing or diaphoretic.   HENT:     Head: Normocephalic and atraumatic.  Eyes:     General: No scleral icterus.       Right eye: No discharge.        Left eye: No discharge.     Conjunctiva/sclera: Conjunctivae normal.  Cardiovascular:     Rate and Rhythm: Normal rate and regular rhythm.     Heart sounds: Normal heart sounds. No murmur heard.   No friction rub. No gallop.  Pulmonary:     Effort: Pulmonary effort is normal. No respiratory distress.     Breath sounds: Normal breath sounds. No stridor. No wheezing, rhonchi or rales.  Musculoskeletal:        General: Tenderness (lower back) present.     Cervical back: Normal range of motion.     Right lower leg: No edema.     Left lower leg: No edema.  Skin:    General: Skin is warm and dry.  Neurological:     Mental Status: He is alert and oriented to person, place, and time. Mental status is at baseline.  Psychiatric:        Mood and Affect: Mood normal.        Behavior: Behavior normal.        Thought Content: Thought content normal.        Judgment: Judgment normal.

## 2021-04-20 ENCOUNTER — Ambulatory Visit (INDEPENDENT_AMBULATORY_CARE_PROVIDER_SITE_OTHER): Payer: Medicare Other | Admitting: *Deleted

## 2021-04-20 DIAGNOSIS — Z5181 Encounter for therapeutic drug level monitoring: Secondary | ICD-10-CM | POA: Diagnosis not present

## 2021-04-20 DIAGNOSIS — I482 Chronic atrial fibrillation, unspecified: Secondary | ICD-10-CM | POA: Diagnosis not present

## 2021-04-20 DIAGNOSIS — I4891 Unspecified atrial fibrillation: Secondary | ICD-10-CM | POA: Diagnosis not present

## 2021-04-20 LAB — POCT INR: INR: 3.2 — AB (ref 2.0–3.0)

## 2021-04-20 NOTE — Patient Instructions (Signed)
Take warfarin 1/2 tablet tonight then resume 1 tablet daily except 1/2 tablet on Sundays and Wednesdays Recheck in 4 wks 

## 2021-04-21 ENCOUNTER — Other Ambulatory Visit (HOSPITAL_BASED_OUTPATIENT_CLINIC_OR_DEPARTMENT_OTHER): Payer: Self-pay

## 2021-04-21 DIAGNOSIS — R5383 Other fatigue: Secondary | ICD-10-CM

## 2021-05-01 ENCOUNTER — Other Ambulatory Visit: Payer: Self-pay | Admitting: Family Medicine

## 2021-05-05 DIAGNOSIS — M25551 Pain in right hip: Secondary | ICD-10-CM | POA: Diagnosis not present

## 2021-05-05 DIAGNOSIS — M545 Low back pain, unspecified: Secondary | ICD-10-CM | POA: Diagnosis not present

## 2021-05-08 ENCOUNTER — Other Ambulatory Visit: Payer: Self-pay | Admitting: Orthopedic Surgery

## 2021-05-08 ENCOUNTER — Other Ambulatory Visit (HOSPITAL_COMMUNITY): Payer: Self-pay | Admitting: Orthopedic Surgery

## 2021-05-08 DIAGNOSIS — M5441 Lumbago with sciatica, right side: Secondary | ICD-10-CM

## 2021-05-08 DIAGNOSIS — M5442 Lumbago with sciatica, left side: Secondary | ICD-10-CM

## 2021-05-18 ENCOUNTER — Ambulatory Visit (INDEPENDENT_AMBULATORY_CARE_PROVIDER_SITE_OTHER): Payer: Medicare Other | Admitting: *Deleted

## 2021-05-18 DIAGNOSIS — Z5181 Encounter for therapeutic drug level monitoring: Secondary | ICD-10-CM | POA: Diagnosis not present

## 2021-05-18 DIAGNOSIS — I4891 Unspecified atrial fibrillation: Secondary | ICD-10-CM | POA: Diagnosis not present

## 2021-05-18 DIAGNOSIS — I482 Chronic atrial fibrillation, unspecified: Secondary | ICD-10-CM

## 2021-05-18 LAB — POCT INR: INR: 2.3 (ref 2.0–3.0)

## 2021-05-18 NOTE — Patient Instructions (Signed)
Continue 1 tablet daily except 1/2 tablet on Sundays and Wednesdays Recheck in 4 wks

## 2021-05-19 ENCOUNTER — Encounter: Payer: Self-pay | Admitting: Family Medicine

## 2021-05-19 ENCOUNTER — Other Ambulatory Visit: Payer: Self-pay

## 2021-05-19 ENCOUNTER — Ambulatory Visit (HOSPITAL_COMMUNITY)
Admission: RE | Admit: 2021-05-19 | Discharge: 2021-05-19 | Disposition: A | Discharge status: Home / Self Care | Payer: Medicare Other | Source: Ambulatory Visit | Attending: Orthopedic Surgery | Admitting: Orthopedic Surgery

## 2021-05-19 ENCOUNTER — Telehealth: Payer: Self-pay | Admitting: Family Medicine

## 2021-05-19 ENCOUNTER — Ambulatory Visit (INDEPENDENT_AMBULATORY_CARE_PROVIDER_SITE_OTHER): Payer: Medicare Other | Admitting: Family Medicine

## 2021-05-19 VITALS — BP 135/70 | HR 99 | Temp 98.1°F | Ht 66.0 in | Wt 174.8 lb

## 2021-05-19 DIAGNOSIS — N182 Chronic kidney disease, stage 2 (mild): Secondary | ICD-10-CM

## 2021-05-19 DIAGNOSIS — E559 Vitamin D deficiency, unspecified: Secondary | ICD-10-CM

## 2021-05-19 DIAGNOSIS — R7303 Prediabetes: Secondary | ICD-10-CM | POA: Diagnosis not present

## 2021-05-19 DIAGNOSIS — R5383 Other fatigue: Secondary | ICD-10-CM | POA: Diagnosis not present

## 2021-05-19 DIAGNOSIS — E782 Mixed hyperlipidemia: Secondary | ICD-10-CM

## 2021-05-19 DIAGNOSIS — R269 Unspecified abnormalities of gait and mobility: Secondary | ICD-10-CM | POA: Diagnosis not present

## 2021-05-19 DIAGNOSIS — N400 Enlarged prostate without lower urinary tract symptoms: Secondary | ICD-10-CM | POA: Diagnosis not present

## 2021-05-19 DIAGNOSIS — M5442 Lumbago with sciatica, left side: Secondary | ICD-10-CM | POA: Insufficient documentation

## 2021-05-19 DIAGNOSIS — I480 Paroxysmal atrial fibrillation: Secondary | ICD-10-CM | POA: Diagnosis not present

## 2021-05-19 DIAGNOSIS — M5441 Lumbago with sciatica, right side: Secondary | ICD-10-CM | POA: Insufficient documentation

## 2021-05-19 DIAGNOSIS — I2510 Atherosclerotic heart disease of native coronary artery without angina pectoris: Secondary | ICD-10-CM | POA: Diagnosis not present

## 2021-05-19 DIAGNOSIS — K219 Gastro-esophageal reflux disease without esophagitis: Secondary | ICD-10-CM

## 2021-05-19 DIAGNOSIS — M47816 Spondylosis without myelopathy or radiculopathy, lumbar region: Secondary | ICD-10-CM | POA: Diagnosis not present

## 2021-05-19 DIAGNOSIS — J449 Chronic obstructive pulmonary disease, unspecified: Secondary | ICD-10-CM

## 2021-05-19 MED ORDER — REPATHA SURECLICK 140 MG/ML ~~LOC~~ SOAJ: 140.0000 mg | SUBCUTANEOUS | 5 refills | Status: DC

## 2021-05-19 NOTE — Progress Notes (Signed)
Assessment & Plan:  1. 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   2. Mixed hyperlipidemia Well controlled on current regimen.  - CMP14+EGFR; Future - Lipid panel; Future - Evolocumab (REPATHA SURECLICK) 409 MG/ML SOAJ; Inject 140 mg into the skin every 14 (fourteen) days.  Dispense: 2 mL; Refill: 5  3. Coronary artery disease involving native coronary artery of native heart without angina pectoris Continue statin. - CMP14+EGFR; Future - Lipid panel; Future  4. Paroxysmal atrial fibrillation ([place]) Managed by cardiology.  - CBC with Differential/Platelet; Future - CMP14+EGFR; Future - Vitamin B12; Future  5. Chronic kidney disease (CKD), stage II (mild) - CMP14+EGFR; Future  6. Benign prostatic hyperplasia, unspecified whether lower urinary tract symptoms present - CMP14+EGFR; Future - PSA, total and free; Future  7. Gastroesophageal reflux disease, unspecified whether esophagitis present Well controlled on current regimen.  - CMP14+EGFR; Future  8. Vitamin D insufficiency Labs to assess. - VITAMIN D 25 Hydroxy (Vit-D Deficiency, Fractures); Future  9. Prediabetes Labs to assess. - Bayer DCA Hb A1c Waived; Future  10. Unspecified abnormalities of gait and mobility - Vitamin B12; Future  11. Fatigue, unspecified type Negative work-up with previous labs, ECHO, and carotid dopplers. Plan to repeat labs and for patient to complete sleep study next month.  - CBC with Differential/Platelet; Future - CMP14+EGFR; Future - Vitamin B12; Future - TSH; Future - Testosterone,Free and Total; Future   Return in about 4 months (around 09/17/2021) for follow-up of chronic medication conditions.  [name], MSN, APRN, FNP-C [facility]  Subjective:    Patient ID: [name], male    DOB: [birth year], 80 y.o.   MRN: [id number]  Patient Care Team: Loman Brooklyn, FNP as PCP - General (Family Medicine) Harl Bowie,  Alphonse Guild, MD as PCP - Cardiology (Cardiology) Harl Bowie Alphonse Guild, MD as Consulting Physician (Cardiology) Irine Seal, MD as Attending Physician (Urology) Lavera Guise, [facility] (Pharmacist)   Chief Complaint:  Chief Complaint  Patient presents with   Prediabetes   Hyperlipidemia    3 month follow up of chronic medical conditions    Fatigue    Ongoing     HPI: [name] is a 80 y.o. male presenting on 05/19/2021 for Prediabetes, Hyperlipidemia (3 month follow up of chronic medical conditions ), and Fatigue (Ongoing )  Patient is accompanied by his wife who he is okay with being present.  COPD: Patient is doing well with Breztri.  He has an albuterol inhaler to use if needed, as well as a nebulizer.   Hyperlipidemia/CAD: Patient was previously going to the lipid clinic.  He is doing Repatha injections every 2 weeks which he reports he is doing well with.    A-Fib: Managed by cardiology.  Patient goes to the anticoagulation clinic for INR monitoring and Coumadin adjustment.    GERD: taking Prilosec.  Vitamin D insufficiency: Patient continues to take vitamin D supplement once weekly.  Gait abnormality: patient was referred to neurology where he was told he does not have Parkinson's disease after they watched him walk down the hallway. He reports today he is dragging his feet when he gets really tired. States he feels tired and just doesn't have any energy all the time. He has been reporting fatigue for at least 2 years now. At our last visit I arranged for his neurologist to assess for sleep apnea; his sleep study is scheduled for 06/25/2021.  New complaints: None   Social  history:  Relevant past medical, surgical, family and social history reviewed and updated as indicated. Interim medical history since our last visit reviewed.  Allergies and medications reviewed and updated.  DATA REVIEWED: CHART IN EPIC  ROS: Negative unless specifically indicated above in HPI.     Current Outpatient Medications:    albuterol (PROVENTIL) (2.5 MG/3ML) 0.083% nebulizer solution, Take 3 mLs (2.5 mg total) by nebulization every 6 (six) hours as needed for wheezing or shortness of breath., Disp: 72 mL, Rfl: 0   albuterol (VENTOLIN HFA) 108 (90 Base) MCG/ACT inhaler, Inhale 2 puffs into the lungs every 6 (six) hours as needed for wheezing or shortness of breath., Disp: 18 g, Rfl: 11   ARTIFICIAL TEAR OP, Place 1 drop into the left eye at bedtime., Disp: , Rfl:    Budeson-Glycopyrrol-Formoterol (BREZTRI AEROSPHERE) 160-9-4.8 MCG/ACT AERO, Inhale 2 puffs into the lungs in the morning and at bedtime., Disp: 10.7 g, Rfl: 12   ketorolac (ACULAR) 0.5 % ophthalmic solution, Place 1 drop into the left eye 4 (four) times daily., Disp: 5 mL, Rfl: 2   metoprolol tartrate (LOPRESSOR) 25 MG tablet, Take 1 tablet (25 mg total) by mouth 2 (two) times daily., Disp: , Rfl:    omeprazole (PRILOSEC) 20 MG capsule, TAKE 1 CAPSULE EVERY DAY, Disp: 90 capsule, Rfl: 1   traZODone (DESYREL) 50 MG tablet, TAKE 1 TABLET AT BEDTIME AS NEEDED FOR SLEEP, Disp: 90 tablet, Rfl: 1   Vitamin D, Ergocalciferol, (DRISDOL) 1.25 MG (50000 UNIT) CAPS capsule, TAKE 1 CAPSULE EVERY 7 DAYS., Disp: 12 capsule, Rfl: 1   warfarin (COUMADIN) 5 MG tablet, TAKE 1 TABLET DAILY OR AS DIRECTED, Disp: 90 tablet, Rfl: 3   Evolocumab (REPATHA SURECLICK) 923 MG/ML SOAJ, Inject 140 mg into the skin every 14 (fourteen) days. (Patient not taking: Reported on 05/19/2021), Disp: 2 mL, Rfl: 11   Allergies  Allergen Reactions   Asa [Aspirin]     GI upset   Niacin     hot flashes   Pravastatin     myalgias   Rosuvastatin     myalgias   Simvastatin     Myalgias    Past Medical History:  Diagnosis Date   Anxiety disorder    Benign prostatic hypertrophy    Bilateral cataracts    CAD (coronary artery disease)    Catheterization 2009, mild CAD   Carotid artery disease ([place])    Doppler, September, 2011, less than 50% bilateral    COPD (chronic obstructive pulmonary disease) (HCC)    CVA (cerebral infarction)    Hospitalization.  MRI...    Left anterior cerebral artery territory, September, 2011... Coumadin started   DYSLIPIDEMIA 11/01/2009   Qualifier: Diagnosis of  By: Ron Parker, MD, [facility], [place]    Ejection fraction    EF 60%   Ejection fraction    EF 55-60%, echo, September, 2011   Gastroesophageal reflux disease    Hyperlipidemia    Persistent atrial fibrillation (HCC)        Ptosis of eyelid, left    Surgery in the past   Sweating abnormality    Episodes of sweating appeared to be related to a delayed reaction to niacin   Vitamin D insufficiency     Past Surgical History:  Procedure Laterality Date   APPENDECTOMY     CATARACT EXTRACTION, BILATERAL     INGUINAL HERNIA REPAIR Left 07/02/2019   INGUINAL HERNIA REPAIR Left 07/01/2020   Procedure: LEFT INGUINAL HERNIA REPAIR WITH MESH;  Surgeon: Virl Cagey, MD;  Location: [facility];  Service: General;  Laterality: Left;   KNEE SURGERY Right 2019   torn ligament?    TRANSURETHRAL RESECTION OF PROSTATE N/A 08/19/2018   Procedure: TRANSURETHRAL RESECTION OF THE PROSTATE (TURP) OF PROSTATE ABCESS;  Surgeon: Alexis Frock, MD;  Location: [facility];  Service: Urology;  Laterality: N/A;    Social History   Socioeconomic History   Marital status: Married    Spouse name: shirley   Number of children: 5   Years of education: 11   Highest education level: 11th grade  Occupational History   Occupation: Retired Statistician   Tobacco Use   Smoking status: Former    Packs/day: 3.00    Years: 50.00    Pack years: 150.00    Types: Cigarettes    Quit date: 03/18/1990    Years since quitting: 31.1   Smokeless tobacco: Former    Types: Chew    Quit date: 06/18/2000  Vaping Use   Vaping Use: Never used  Substance and Sexual Activity   Alcohol use: No    Alcohol/week: 0.0 standard drinks   Drug use: No   Sexual activity: Not Currently  Other Topics  Concern   Not on file  Social History Narrative   Quit smoking in 1993. No significant alcohol abuse. Retired from trucking.    Social Determinants of Health   Financial Resource Strain: Low Risk    Difficulty of Paying Living Expenses: Not very hard  Food Insecurity: No Food Insecurity   Worried About Charity fundraiser in the Last Year: Never true   Ran Out of Food in the Last Year: Never true  Transportation Needs: No Transportation Needs   Lack of Transportation (Medical): No   Lack of Transportation (Non-Medical): No  Physical Activity: Insufficiently Active   Days of Exercise per Week: 3 days   Minutes of Exercise per Session: 30 min  Stress: No Stress Concern Present   Feeling of Stress : Not at all  Social Connections: Socially Integrated   Frequency of Communication with Friends and Family: More than three times a week   Frequency of Social Gatherings with Friends and Family: More than three times a week   Attends Religious Services: More than 4 times per year   Active Member of [employer] or Organizations: Yes   Attends Music therapist: More than 4 times per year   Marital Status: Married  Human resources officer Violence: Not At Risk   Fear of Current or Ex-Partner: No   Emotionally Abused: No   Physically Abused: No   Sexually Abused: No        Objective:    BP 135/70   Pulse 99   Temp 98.1 F (36.7 C) (Temporal)   Ht 5' 6"  (1.676 m)   Wt 174 lb 12.8 oz (79.3 kg)   SpO2 92%   BMI 28.21 kg/m   Wt Readings from Last 3 Encounters:  05/19/21 174 lb 12.8 oz (79.3 kg)  03/29/21 178 lb 6.4 oz (80.9 kg)  03/17/21 176 lb (79.8 kg)    Physical Exam Vitals reviewed.  Constitutional:      General: He is not in acute distress.    Appearance: Normal appearance. He is overweight. He is not ill-appearing, toxic-appearing or diaphoretic.  HENT:     Head: Normocephalic and atraumatic.  Eyes:     General: No scleral icterus.       Right eye: No discharge.  Left eye: No discharge.     Conjunctiva/sclera: Conjunctivae normal.  Cardiovascular:     Rate and Rhythm: Normal rate. Rhythm irregular.     Heart sounds: Normal heart sounds. No murmur heard.   No friction rub. No gallop.  Pulmonary:     Effort: Pulmonary effort is normal. No respiratory distress.     Breath sounds: Normal breath sounds. No stridor. No wheezing, rhonchi or rales.  Musculoskeletal:        General: Normal range of motion.     Cervical back: Normal range of motion.  Skin:    General: Skin is warm and dry.  Neurological:     Mental Status: He is alert and oriented to person, place, and time. Mental status is at baseline.     Gait: Gait abnormal (shuffling).  Psychiatric:        Mood and Affect: Mood normal.        Behavior: Behavior normal.        Thought Content: Thought content normal.        Judgment: Judgment normal.    Lab Results  Component Value Date   TSH 4.360 04/07/2019   Lab Results  Component Value Date   WBC 6.7 02/17/2021   HGB 14.6 02/17/2021   HCT 44.8 02/17/2021   MCV 91 02/17/2021   PLT 236 02/17/2021   Lab Results  Component Value Date   NA 142 02/17/2021   K 4.6 02/17/2021   CO2 27 02/17/2021   GLUCOSE 93 02/17/2021   BUN 19 02/17/2021   CREATININE 1.10 02/17/2021   BILITOT 0.4 02/17/2021   ALKPHOS 63 02/17/2021   AST 18 02/17/2021   ALT 11 02/17/2021   PROT 6.4 02/17/2021   ALBUMIN 4.1 02/17/2021   CALCIUM 9.3 02/17/2021   ANIONGAP 8 06/29/2020   EGFR 67 02/17/2021   Lab Results  Component Value Date   CHOL 122 02/17/2021   Lab Results  Component Value Date   HDL 34 (L) 02/17/2021   Lab Results  Component Value Date   LDLCALC 63 02/17/2021   Lab Results  Component Value Date   TRIG 145 02/17/2021   Lab Results  Component Value Date   CHOLHDL 3.6 02/17/2021   Lab Results  Component Value Date   HGBA1C 5.9 02/17/2021

## 2021-05-19 NOTE — Telephone Encounter (Signed)
Since we did not discuss this, he will need an appointment, even if it is a telephone appointment.

## 2021-05-20 DIAGNOSIS — J101 Influenza due to other identified influenza virus with other respiratory manifestations: Secondary | ICD-10-CM | POA: Diagnosis not present

## 2021-05-20 DIAGNOSIS — I4891 Unspecified atrial fibrillation: Secondary | ICD-10-CM | POA: Diagnosis not present

## 2021-05-20 DIAGNOSIS — E7800 Pure hypercholesterolemia, unspecified: Secondary | ICD-10-CM | POA: Diagnosis not present

## 2021-05-20 DIAGNOSIS — Z823 Family history of stroke: Secondary | ICD-10-CM | POA: Diagnosis not present

## 2021-05-20 DIAGNOSIS — Z7951 Long term (current) use of inhaled steroids: Secondary | ICD-10-CM | POA: Diagnosis not present

## 2021-05-20 DIAGNOSIS — Z87891 Personal history of nicotine dependence: Secondary | ICD-10-CM | POA: Diagnosis not present

## 2021-05-20 DIAGNOSIS — I509 Heart failure, unspecified: Secondary | ICD-10-CM | POA: Diagnosis not present

## 2021-05-20 DIAGNOSIS — R059 Cough, unspecified: Secondary | ICD-10-CM | POA: Diagnosis not present

## 2021-05-20 DIAGNOSIS — J9601 Acute respiratory failure with hypoxia: Secondary | ICD-10-CM | POA: Diagnosis not present

## 2021-05-20 DIAGNOSIS — Z79899 Other long term (current) drug therapy: Secondary | ICD-10-CM | POA: Diagnosis not present

## 2021-05-20 DIAGNOSIS — R5383 Other fatigue: Secondary | ICD-10-CM | POA: Diagnosis not present

## 2021-05-20 DIAGNOSIS — N400 Enlarged prostate without lower urinary tract symptoms: Secondary | ICD-10-CM | POA: Diagnosis not present

## 2021-05-20 DIAGNOSIS — R7989 Other specified abnormal findings of blood chemistry: Secondary | ICD-10-CM | POA: Diagnosis not present

## 2021-05-20 DIAGNOSIS — K219 Gastro-esophageal reflux disease without esophagitis: Secondary | ICD-10-CM | POA: Diagnosis not present

## 2021-05-20 DIAGNOSIS — Z7901 Long term (current) use of anticoagulants: Secondary | ICD-10-CM | POA: Diagnosis not present

## 2021-05-20 DIAGNOSIS — Z9981 Dependence on supplemental oxygen: Secondary | ICD-10-CM | POA: Diagnosis not present

## 2021-05-20 DIAGNOSIS — Z20822 Contact with and (suspected) exposure to covid-19: Secondary | ICD-10-CM | POA: Diagnosis not present

## 2021-05-20 DIAGNOSIS — J441 Chronic obstructive pulmonary disease with (acute) exacerbation: Secondary | ICD-10-CM | POA: Diagnosis not present

## 2021-05-20 DIAGNOSIS — I5030 Unspecified diastolic (congestive) heart failure: Secondary | ICD-10-CM | POA: Diagnosis not present

## 2021-05-20 DIAGNOSIS — N182 Chronic kidney disease, stage 2 (mild): Secondary | ICD-10-CM | POA: Diagnosis present

## 2021-05-20 DIAGNOSIS — I4811 Longstanding persistent atrial fibrillation: Secondary | ICD-10-CM | POA: Diagnosis not present

## 2021-05-20 DIAGNOSIS — I2510 Atherosclerotic heart disease of native coronary artery without angina pectoris: Secondary | ICD-10-CM | POA: Diagnosis present

## 2021-05-20 DIAGNOSIS — J449 Chronic obstructive pulmonary disease, unspecified: Secondary | ICD-10-CM | POA: Diagnosis not present

## 2021-05-20 DIAGNOSIS — I5031 Acute diastolic (congestive) heart failure: Secondary | ICD-10-CM | POA: Diagnosis not present

## 2021-05-20 DIAGNOSIS — E785 Hyperlipidemia, unspecified: Secondary | ICD-10-CM | POA: Diagnosis not present

## 2021-05-20 DIAGNOSIS — I482 Chronic atrial fibrillation, unspecified: Secondary | ICD-10-CM | POA: Diagnosis not present

## 2021-05-20 DIAGNOSIS — R531 Weakness: Secondary | ICD-10-CM | POA: Diagnosis not present

## 2021-05-22 ENCOUNTER — Encounter: Payer: Self-pay | Admitting: Family Medicine

## 2021-05-22 ENCOUNTER — Telehealth: Payer: Self-pay

## 2021-05-22 NOTE — Telephone Encounter (Signed)
PA repatha   Rx #: [id number]  Sent to plan

## 2021-05-22 NOTE — Telephone Encounter (Signed)
Informed pt of this. He is currently in [place] at the hospital. He will call office back if needed.

## 2021-05-23 NOTE — Telephone Encounter (Signed)
Your request has been approved PA Case: [id number], Status: Approved, Coverage Starts on: 05/22/2021 12:00:00 AM, Coverage Ends on: 11/18/2021 12:00:00 AM. Questions? Contact [phone].  Patient and pharmacy aware.

## 2021-05-24 ENCOUNTER — Telehealth: Payer: Self-pay | Admitting: Family Medicine

## 2021-05-24 ENCOUNTER — Telehealth: Payer: Self-pay

## 2021-05-24 NOTE — Telephone Encounter (Signed)
Patient has appt 12-8 for hospital f/u

## 2021-05-24 NOTE — Telephone Encounter (Signed)
Transition Care Management Follow-up Telephone Call Date of discharge and from where: [facility] - 05/23/21 - acute respiratory failure How have you been since you were released from the hospital? Doing okay, just very tired Any questions or concerns? No  Items Reviewed: Did the pt receive and understand the discharge instructions provided? Yes  Medications obtained and verified?  He went to pharmacy yesterday and meds were not ready - his albuterol is expired - I told him to go to pharmacy now to pick up prescriptions and xopenex was called in which will take the place of albuterol  Other? No  Any new allergies since your discharge? No  Dietary orders reviewed? Yes Do you have support at home? Yes   Home Care and Equipment/Supplies: Were home health services ordered? yes If so, what is the name of the agency? [facility]  Has the agency set up a time to come to the patient's home? no Were any new equipment or medical supplies ordered?  Yes: oxygen What is the name of the medical supply agency? [employer] Were you able to get the supplies/equipment? yes Do you have any questions related to the use of the equipment or supplies? No  Functional Questionnaire: (I = Independent and D = Dependent) ADLs: I  Bathing/Dressing- I  Meal Prep- I  Eating- I  Maintaining continence- I  Transferring/Ambulation- I  Managing Meds- I  Follow up appointments reviewed:  PCP Hospital f/u appt confirmed? Yes  Scheduled to see [name] on 05/25/21 @ 3:50. [facility] f/u appt confirmed? No   Are transportation arrangements needed? No  If their condition worsens, is the pt aware to call PCP or go to the Emergency Dept.? Yes Was the patient provided with contact information for the PCP's office or ED? Yes Was to pt encouraged to call back with questions or concerns? Yes

## 2021-05-25 ENCOUNTER — Encounter: Payer: Self-pay | Admitting: Family Medicine

## 2021-05-25 ENCOUNTER — Ambulatory Visit (INDEPENDENT_AMBULATORY_CARE_PROVIDER_SITE_OTHER): Payer: Medicare Other | Admitting: Family Medicine

## 2021-05-25 VITALS — BP 154/80 | HR 93 | Temp 97.0°F | Ht 66.0 in | Wt 169.2 lb

## 2021-05-25 DIAGNOSIS — R8290 Unspecified abnormal findings in urine: Secondary | ICD-10-CM | POA: Diagnosis not present

## 2021-05-25 DIAGNOSIS — J441 Chronic obstructive pulmonary disease with (acute) exacerbation: Secondary | ICD-10-CM | POA: Diagnosis not present

## 2021-05-25 DIAGNOSIS — J9601 Acute respiratory failure with hypoxia: Secondary | ICD-10-CM | POA: Diagnosis not present

## 2021-05-25 DIAGNOSIS — Z09 Encounter for follow-up examination after completed treatment for conditions other than malignant neoplasm: Secondary | ICD-10-CM

## 2021-05-25 DIAGNOSIS — R0602 Shortness of breath: Secondary | ICD-10-CM | POA: Diagnosis not present

## 2021-05-25 NOTE — Progress Notes (Signed)
Assessment & Plan:  1. Hospital discharge follow-up - improving, continue antibiotics and steroids - home health for PT and oxygen needs - CBC with Differential/Platelet - CMP14+EGFR - Brain natriuretic peptide  2. COPD exacerbation ([place]) - continue Breztri BID and albuterol as needed - discussed weaning of oxygen once he is feeling better - CBC with Differential/Platelet - CMP14+EGFR - CoaguChek XS/INR Waived  3. Abnormal urinalysis - Urinalysis, Routine w reflex microscopic  4. Shortness of breath - Brain natriuretic peptide   Follow up plan: Return if symptoms worsen or fail to improve.  Lucile Crater, NP Student  I personally was present during the history, physical exam, and medical decision-making activities of this service and have verified that the service and findings are accurately documented in the nurse practitioner student's note.  [name], MSN, APRN, FNP-C [facility]   Subjective:   Patient ID: [name], male    DOB: 1941-01-08, 80 y.o.   MRN: [id number]  HPI: [name] is a 80 y.o. male presenting on 05/25/2021 for Transitions [facility] 05/23/21- acute respiratory failure. C/O fatigue )  He is here today for hospital follow up [facility] 12/3-12/6) for acute respiratory failure secondary to influenza A and COPD exacerbation.   He states home health was at the house today. He has been using his Breztri inhaler since hospital discharge, but he was using Breo while in the hospital. He states he is still weak but is more mobile now that he is out of the hospital. He is wearing 3L oxygen and was told to follow up with his PCP to determine if he still needs it and for how long.    ROS: Negative unless specifically indicated above in HPI.   Relevant past medical history reviewed and updated as indicated.   Allergies and medications reviewed and updated.   Current Outpatient Medications:    albuterol  (PROVENTIL) (2.5 MG/3ML) 0.083% nebulizer solution, Take 3 mLs (2.5 mg total) by nebulization every 6 (six) hours as needed for wheezing or shortness of breath., Disp: 72 mL, Rfl: 0   albuterol (VENTOLIN HFA) 108 (90 Base) MCG/ACT inhaler, Inhale 2 puffs into the lungs every 6 (six) hours as needed for wheezing or shortness of breath., Disp: 18 g, Rfl: 11   ARTIFICIAL TEAR OP, Place 1 drop into the left eye at bedtime., Disp: , Rfl:    Budeson-Glycopyrrol-Formoterol (BREZTRI AEROSPHERE) 160-9-4.8 MCG/ACT AERO, Inhale 2 puffs into the lungs in the morning and at bedtime., Disp: 10.7 g, Rfl: 12   cefdinir (OMNICEF) 300 MG capsule, Take by mouth., Disp: , Rfl:    Evolocumab (REPATHA SURECLICK) 211 MG/ML SOAJ, Inject 140 mg into the skin every 14 (fourteen) days., Disp: 2 mL, Rfl: 5   fluticasone furoate-vilanterol (BREO ELLIPTA) 100-25 MCG/ACT AEPB, Inhale 1 puff into the lungs daily., Disp: , Rfl:    levalbuterol (XOPENEX) 0.63 MG/3ML nebulizer solution, Inhale into the lungs., Disp: , Rfl:    metoprolol tartrate (LOPRESSOR) 25 MG tablet, Take 1 tablet (25 mg total) by mouth 2 (two) times daily., Disp: , Rfl:    omeprazole (PRILOSEC) 20 MG capsule, TAKE 1 CAPSULE EVERY DAY, Disp: 90 capsule, Rfl: 1   predniSONE (DELTASONE) 20 MG tablet, Take by mouth., Disp: , Rfl:    traZODone (DESYREL) 50 MG tablet, TAKE 1 TABLET AT BEDTIME AS NEEDED FOR SLEEP, Disp: 90 tablet, Rfl: 1   Vitamin D, Ergocalciferol, (DRISDOL) 1.25 MG (50000 UNIT) CAPS capsule, TAKE 1  CAPSULE EVERY 7 DAYS., Disp: 12 capsule, Rfl: 1   warfarin (COUMADIN) 5 MG tablet, TAKE 1 TABLET DAILY OR AS DIRECTED, Disp: 90 tablet, Rfl: 3   ketorolac (ACULAR) 0.5 % ophthalmic solution, Place 1 drop into the left eye 4 (four) times daily. (Patient not taking: Reported on 05/25/2021), Disp: 5 mL, Rfl: 2  Allergies  Allergen Reactions   Asa [Aspirin]     GI upset   Niacin     hot flashes   Pravastatin     myalgias   Rosuvastatin     myalgias    Simvastatin     Myalgias     Objective:   BP (!) 154/80   Pulse 93   Temp (!) 97 F (36.1 C) (Temporal)   Ht 5' 6"  (1.676 m)   Wt 76.7 kg   SpO2 98%   BMI 27.31 kg/m    Physical Exam Vitals reviewed.  Constitutional:      General: He is not in acute distress.    Appearance: Normal appearance. He is obese. He is not ill-appearing, toxic-appearing or diaphoretic.  HENT:     Head: Normocephalic and atraumatic.     Nose: Nose normal. No congestion or rhinorrhea.     Mouth/Throat:     Mouth: Mucous membranes are moist.     Pharynx: Oropharynx is clear. No oropharyngeal exudate or posterior oropharyngeal erythema.  Eyes:     General: No scleral icterus.       Right eye: No discharge.        Left eye: No discharge.     Extraocular Movements: Extraocular movements intact.     Conjunctiva/sclera: Conjunctivae normal.     Pupils: Pupils are equal, round, and reactive to light.  Cardiovascular:     Rate and Rhythm: Normal rate. Rhythm irregular.     Pulses: Normal pulses.     Heart sounds: Normal heart sounds. No murmur heard.   No friction rub. No gallop.  Pulmonary:     Effort: Pulmonary effort is normal. No respiratory distress.     Breath sounds: No stridor. Wheezing and rhonchi present. No rales.  Abdominal:     General: Bowel sounds are normal. There is no distension.     Palpations: Abdomen is soft. There is no mass.     Tenderness: There is no abdominal tenderness.     Hernia: No hernia is present.  Musculoskeletal:        General: Normal range of motion.     Cervical back: Normal range of motion.     Right lower leg: No edema.     Left lower leg: No edema.  Skin:    General: Skin is warm and dry.     Capillary Refill: Capillary refill takes less than 2 seconds.  Neurological:     General: No focal deficit present.     Mental Status: He is alert and oriented to person, place, and time. Mental status is at baseline.     Motor: No weakness.  Psychiatric:         Mood and Affect: Mood normal.        Behavior: Behavior normal.        Thought Content: Thought content normal.        Judgment: Judgment normal.

## 2021-05-26 LAB — CMP14+EGFR
ALT: 33 IU/L (ref 0–44)
AST: 26 IU/L (ref 0–40)
Albumin/Globulin Ratio: 1.6 (ref 1.2–2.2)
Albumin: 3.8 g/dL (ref 3.6–4.6)
Alkaline Phosphatase: 56 IU/L (ref 44–121)
BUN/Creatinine Ratio: 25 — ABNORMAL HIGH (ref 10–24)
BUN: 26 mg/dL (ref 8–27)
Bilirubin Total: 0.3 mg/dL (ref 0.0–1.2)
CO2: 31 mmol/L — ABNORMAL HIGH (ref 20–29)
Calcium: 8.9 mg/dL (ref 8.6–10.2)
Chloride: 100 mmol/L (ref 96–106)
Creatinine, Ser: 1.03 mg/dL (ref 0.76–1.27)
Globulin, Total: 2.4 g/dL (ref 1.5–4.5)
Glucose: 129 mg/dL — ABNORMAL HIGH (ref 70–99)
Potassium: 4.3 mmol/L (ref 3.5–5.2)
Sodium: 143 mmol/L (ref 134–144)
Total Protein: 6.2 g/dL (ref 6.0–8.5)
eGFR: 73 mL/min/{1.73_m2} (ref >59)

## 2021-05-26 LAB — URINALYSIS, ROUTINE W REFLEX MICROSCOPIC
Bilirubin, UA: NEGATIVE
Glucose, UA: NEGATIVE
Ketones, UA: NEGATIVE
Leukocytes,UA: NEGATIVE
Nitrite, UA: NEGATIVE
Protein,UA: NEGATIVE
Specific Gravity, UA: 1.025 (ref 1.005–1.030)
Urobilinogen, Ur: 0.2 mg/dL (ref 0.2–1.0)
pH, UA: 5.5 (ref 5.0–7.5)

## 2021-05-26 LAB — CBC WITH DIFFERENTIAL/PLATELET
Basophils Absolute: 0.1 10*3/uL (ref 0.0–0.2)
Basos: 0 %
EOS (ABSOLUTE): 0 10*3/uL (ref 0.0–0.4)
Eos: 0 %
Hematocrit: 47.2 % (ref 37.5–51.0)
Hemoglobin: 15.6 g/dL (ref 13.0–17.7)
Immature Grans (Abs): 0.3 10*3/uL — ABNORMAL HIGH (ref 0.0–0.1)
Immature Granulocytes: 2 %
Lymphocytes Absolute: 1.5 10*3/uL (ref 0.7–3.1)
Lymphs: 12 %
MCH: 29.4 pg (ref 26.6–33.0)
MCHC: 33.1 g/dL (ref 31.5–35.7)
MCV: 89 fL (ref 79–97)
Monocytes Absolute: 0.8 10*3/uL (ref 0.1–0.9)
Monocytes: 7 %
Neutrophils Absolute: 9.4 10*3/uL — ABNORMAL HIGH (ref 1.4–7.0)
Neutrophils: 79 %
Platelets: 339 10*3/uL (ref 150–450)
RBC: 5.3 x10E6/uL (ref 4.14–5.80)
RDW: 12.3 % (ref 11.6–15.4)
WBC: 12 10*3/uL — ABNORMAL HIGH (ref 3.4–10.8)

## 2021-05-26 LAB — COAGUCHEK XS/INR WAIVED
INR: 2.2 — ABNORMAL HIGH (ref 0.9–1.1)
Prothrombin Time: 26.1 s

## 2021-05-26 LAB — MICROSCOPIC EXAMINATION

## 2021-05-26 LAB — BRAIN NATRIURETIC PEPTIDE: BNP: 73.6 pg/mL (ref 0.0–100.0)

## 2021-06-06 ENCOUNTER — Ambulatory Visit (INDEPENDENT_AMBULATORY_CARE_PROVIDER_SITE_OTHER): Payer: Medicare Other

## 2021-06-06 ENCOUNTER — Other Ambulatory Visit: Payer: Self-pay

## 2021-06-06 DIAGNOSIS — J9601 Acute respiratory failure with hypoxia: Secondary | ICD-10-CM

## 2021-06-06 DIAGNOSIS — I4821 Permanent atrial fibrillation: Secondary | ICD-10-CM

## 2021-06-06 DIAGNOSIS — Z7951 Long term (current) use of inhaled steroids: Secondary | ICD-10-CM | POA: Diagnosis not present

## 2021-06-06 DIAGNOSIS — I2510 Atherosclerotic heart disease of native coronary artery without angina pectoris: Secondary | ICD-10-CM | POA: Diagnosis not present

## 2021-06-06 DIAGNOSIS — F419 Anxiety disorder, unspecified: Secondary | ICD-10-CM

## 2021-06-06 DIAGNOSIS — I69351 Hemiplegia and hemiparesis following cerebral infarction affecting right dominant side: Secondary | ICD-10-CM | POA: Diagnosis not present

## 2021-06-06 DIAGNOSIS — J441 Chronic obstructive pulmonary disease with (acute) exacerbation: Secondary | ICD-10-CM

## 2021-06-06 DIAGNOSIS — N182 Chronic kidney disease, stage 2 (mild): Secondary | ICD-10-CM

## 2021-06-14 ENCOUNTER — Telehealth: Payer: Self-pay | Admitting: Family Medicine

## 2021-06-14 ENCOUNTER — Telehealth: Payer: Self-pay | Admitting: Cardiology

## 2021-06-14 NOTE — Telephone Encounter (Signed)
Pt c/o medication issue:  1. Name of Medication:  Evolocumab (REPATHA SURECLICK) 191 MG/ML SOAJ  2. How are you currently taking this medication (dosage and times per day)?  As prescribed  3. Are you having a reaction (difficulty breathing--STAT)?    4. What is your medication issue?   Patient states he contacted his local pharmacy for his refill and he informed that his prescription would cost him $139, but he is unable to afford it. He would like to know if we can offer assistance with purchasing. Please advise.

## 2021-06-14 NOTE — Telephone Encounter (Signed)
Informed patient to call his pcp as they did his last PA for the [place].  He verbalized understanding.

## 2021-06-14 NOTE — Telephone Encounter (Signed)
Pt called stating that he cant get his Repatha Rx refilled because his insurance isnt covering it. Says it will cost him $139 for 2 shots and he takes them every 2 wks. Said he spoke with his Cardiologist about this (see telephone message from today) and was advised to call PCP office about this.   Pt aware that PCP is on vacation and wont be back until next Tuesday. Wants to know if another provider can advise on this.   Please advise and call patient.

## 2021-06-15 ENCOUNTER — Other Ambulatory Visit: Payer: Self-pay

## 2021-06-15 ENCOUNTER — Ambulatory Visit (INDEPENDENT_AMBULATORY_CARE_PROVIDER_SITE_OTHER): Payer: Medicare Other | Admitting: *Deleted

## 2021-06-15 DIAGNOSIS — I4891 Unspecified atrial fibrillation: Secondary | ICD-10-CM | POA: Diagnosis not present

## 2021-06-15 DIAGNOSIS — Z5181 Encounter for therapeutic drug level monitoring: Secondary | ICD-10-CM | POA: Diagnosis not present

## 2021-06-15 LAB — POCT INR: INR: 2.7 (ref 2.0–3.0)

## 2021-06-15 NOTE — Telephone Encounter (Signed)
Patient calling back about his RX on Repatha. States it cost $139. Can't afford that.

## 2021-06-15 NOTE — Telephone Encounter (Signed)
Pt informed that [name] will be back next week to address. Pt is fine with that. He states that if he cannot get the Repatha for free then he does not want to stay on it.

## 2021-06-15 NOTE — Telephone Encounter (Signed)
Ok to wait on brittany to return to address

## 2021-06-15 NOTE — Patient Instructions (Signed)
Continue 1 tablet daily except 1/2 tablet on Sundays and Wednesdays Recheck in 4 wks

## 2021-06-19 NOTE — Telephone Encounter (Signed)
Please call pharmacy to ask if a PA is needed.  It looks like the last one was approved through June 2023, so I am not sure that is the issue.  If not please get him set up to see Almyra Free for prescription assistance.

## 2021-06-20 ENCOUNTER — Encounter: Payer: Self-pay | Admitting: Family Medicine

## 2021-06-20 ENCOUNTER — Ambulatory Visit (INDEPENDENT_AMBULATORY_CARE_PROVIDER_SITE_OTHER): Payer: Medicare Other | Admitting: Family Medicine

## 2021-06-20 VITALS — BP 161/72 | HR 85 | Temp 97.3°F | Ht 66.0 in | Wt 171.6 lb

## 2021-06-20 DIAGNOSIS — Z596 Low income: Secondary | ICD-10-CM | POA: Diagnosis not present

## 2021-06-20 DIAGNOSIS — Z789 Other specified health status: Secondary | ICD-10-CM

## 2021-06-20 NOTE — Telephone Encounter (Signed)
Spoke to patient - he apt with Almyra Free

## 2021-06-20 NOTE — Progress Notes (Signed)
Assessment & Plan:  1. On supplemental oxygen by nasal cannula Oxygen to be discontinued after 6-minute walk without it as he stayed >96% on room air.   2. Patient cannot afford medications Patient has an appointment with our clinical pharmacist next week for prescription assistance of Repatha.    Follow up plan: Return in about 3 months (around 09/18/2021) for follow-up of chronic medication conditions.  [name], MSN, APRN, FNP-C [facility]  Subjective:   Patient ID: ODUS CLASBY, male    DOB: 1940/09/16, 81 y.o.   MRN: [id number]  HPI: EDUARDO HONOR is a 81 y.o. male presenting on 06/20/2021 for oxygen test   Patient is accompanied by his wife, who he is okay with being present.   Patient reports he is here to see if he still needs his oxygen. It is supplied through [facility].  He is also having trouble affording his Repatha. It was previously free, but he was told when he went to pick it up last month it was going to cost $139. His prior authorization was already completed and approved through June 2023.   ROS: Negative unless specifically indicated above in HPI.   Relevant past medical history reviewed and updated as indicated.   Allergies and medications reviewed and updated.   Current Outpatient Medications:    albuterol (PROVENTIL) (2.5 MG/3ML) 0.083% nebulizer solution, Take 3 mLs (2.5 mg total) by nebulization every 6 (six) hours as needed for wheezing or shortness of breath., Disp: 72 mL, Rfl: 0   albuterol (VENTOLIN HFA) 108 (90 Base) MCG/ACT inhaler, Inhale 2 puffs into the lungs every 6 (six) hours as needed for wheezing or shortness of breath., Disp: 18 g, Rfl: 11   ARTIFICIAL TEAR OP, Place 1 drop into the left eye at bedtime., Disp: , Rfl:    Budeson-Glycopyrrol-Formoterol (BREZTRI AEROSPHERE) 160-9-4.8 MCG/ACT AERO, Inhale 2 puffs into the lungs in the morning and at bedtime., Disp: 10.7 g, Rfl: 12   Evolocumab (REPATHA  SURECLICK) 510 MG/ML SOAJ, Inject 140 mg into the skin every 14 (fourteen) days., Disp: 2 mL, Rfl: 5   fluticasone furoate-vilanterol (BREO ELLIPTA) 100-25 MCG/ACT AEPB, Inhale 1 puff into the lungs daily., Disp: , Rfl:    ketorolac (ACULAR) 0.5 % ophthalmic solution, Place 1 drop into the left eye 4 (four) times daily., Disp: 5 mL, Rfl: 2   levalbuterol (XOPENEX) 0.63 MG/3ML nebulizer solution, Inhale into the lungs., Disp: , Rfl:    metoprolol tartrate (LOPRESSOR) 25 MG tablet, Take 1 tablet (25 mg total) by mouth 2 (two) times daily., Disp: , Rfl:    omeprazole (PRILOSEC) 20 MG capsule, TAKE 1 CAPSULE EVERY DAY, Disp: 90 capsule, Rfl: 1   traZODone (DESYREL) 50 MG tablet, TAKE 1 TABLET AT BEDTIME AS NEEDED FOR SLEEP, Disp: 90 tablet, Rfl: 1   Vitamin D, Ergocalciferol, (DRISDOL) 1.25 MG (50000 UNIT) CAPS capsule, TAKE 1 CAPSULE EVERY 7 DAYS., Disp: 12 capsule, Rfl: 1   warfarin (COUMADIN) 5 MG tablet, TAKE 1 TABLET DAILY OR AS DIRECTED, Disp: 90 tablet, Rfl: 3  Allergies  Allergen Reactions   Asa [Aspirin]     GI upset   Niacin     hot flashes   Pravastatin     myalgias   Rosuvastatin     myalgias   Simvastatin     Myalgias     Objective:   BP (!) 161/72    Pulse 85    Temp (!) 97.3 F (36.3  C) (Temporal)    Ht 5\' 6"  (1.676 m)    Wt 171 lb 9.6 oz (77.8 kg)    SpO2 96% Comment: 6 min walk   BMI 27.70 kg/m    Physical Exam Vitals reviewed.  Constitutional:      General: He is not in acute distress.    Appearance: Normal appearance. He is overweight. He is not ill-appearing, toxic-appearing or diaphoretic.  HENT:     Head: Normocephalic and atraumatic.  Eyes:     General: No scleral icterus.       Right eye: No discharge.        Left eye: No discharge.     Conjunctiva/sclera: Conjunctivae normal.  Cardiovascular:     Rate and Rhythm: Normal rate and regular rhythm.     Heart sounds: Normal heart sounds. No murmur heard.   No friction rub. No gallop.  Pulmonary:      Effort: Pulmonary effort is normal. No respiratory distress.     Breath sounds: Normal breath sounds. No stridor. No wheezing, rhonchi or rales.  Musculoskeletal:        General: Normal range of motion.     Cervical back: Normal range of motion.  Skin:    General: Skin is warm and dry.  Neurological:     Mental Status: He is alert and oriented to person, place, and time. Mental status is at baseline.  Psychiatric:        Mood and Affect: Mood normal.        Behavior: Behavior normal.        Thought Content: Thought content normal.        Judgment: Judgment normal.

## 2021-06-20 NOTE — Progress Notes (Signed)
[facility] and they state that they do not do the oxygen. To call [facility] [phone] Called and the number was talking about a life line.  Do you know how to cancel the oxygen or have another number to call?

## 2021-06-20 NOTE — Patient Instructions (Signed)
Almyra Free will call you next Tuesday, 06/27/2021, at 3 PM to help with Repatha cost.

## 2021-06-21 ENCOUNTER — Other Ambulatory Visit: Payer: Self-pay | Admitting: Family Medicine

## 2021-06-24 DIAGNOSIS — Z7901 Long term (current) use of anticoagulants: Secondary | ICD-10-CM | POA: Diagnosis not present

## 2021-06-24 DIAGNOSIS — J441 Chronic obstructive pulmonary disease with (acute) exacerbation: Secondary | ICD-10-CM | POA: Diagnosis not present

## 2021-06-24 DIAGNOSIS — I4821 Permanent atrial fibrillation: Secondary | ICD-10-CM | POA: Diagnosis not present

## 2021-06-24 DIAGNOSIS — Z7952 Long term (current) use of systemic steroids: Secondary | ICD-10-CM | POA: Diagnosis not present

## 2021-06-24 DIAGNOSIS — I69351 Hemiplegia and hemiparesis following cerebral infarction affecting right dominant side: Secondary | ICD-10-CM | POA: Diagnosis not present

## 2021-06-24 DIAGNOSIS — I2510 Atherosclerotic heart disease of native coronary artery without angina pectoris: Secondary | ICD-10-CM | POA: Diagnosis not present

## 2021-06-24 DIAGNOSIS — Z87891 Personal history of nicotine dependence: Secondary | ICD-10-CM | POA: Diagnosis not present

## 2021-06-24 DIAGNOSIS — Z7951 Long term (current) use of inhaled steroids: Secondary | ICD-10-CM | POA: Diagnosis not present

## 2021-06-24 DIAGNOSIS — Z9981 Dependence on supplemental oxygen: Secondary | ICD-10-CM | POA: Diagnosis not present

## 2021-06-24 DIAGNOSIS — J9601 Acute respiratory failure with hypoxia: Secondary | ICD-10-CM | POA: Diagnosis not present

## 2021-06-24 DIAGNOSIS — F419 Anxiety disorder, unspecified: Secondary | ICD-10-CM | POA: Diagnosis not present

## 2021-06-24 DIAGNOSIS — N182 Chronic kidney disease, stage 2 (mild): Secondary | ICD-10-CM | POA: Diagnosis not present

## 2021-06-25 ENCOUNTER — Encounter: Payer: Medicare Other | Admitting: Neurology

## 2021-06-27 ENCOUNTER — Encounter: Payer: Self-pay | Admitting: Pharmacist

## 2021-06-27 ENCOUNTER — Ambulatory Visit (INDEPENDENT_AMBULATORY_CARE_PROVIDER_SITE_OTHER): Payer: Medicare Other | Admitting: Pharmacist

## 2021-06-27 VITALS — BP 120/78

## 2021-06-27 DIAGNOSIS — N182 Chronic kidney disease, stage 2 (mild): Secondary | ICD-10-CM | POA: Diagnosis not present

## 2021-06-27 DIAGNOSIS — J441 Chronic obstructive pulmonary disease with (acute) exacerbation: Secondary | ICD-10-CM | POA: Diagnosis not present

## 2021-06-27 DIAGNOSIS — J9601 Acute respiratory failure with hypoxia: Secondary | ICD-10-CM | POA: Diagnosis not present

## 2021-06-27 DIAGNOSIS — I2510 Atherosclerotic heart disease of native coronary artery without angina pectoris: Secondary | ICD-10-CM | POA: Diagnosis not present

## 2021-06-27 DIAGNOSIS — E782 Mixed hyperlipidemia: Secondary | ICD-10-CM

## 2021-06-27 DIAGNOSIS — I69351 Hemiplegia and hemiparesis following cerebral infarction affecting right dominant side: Secondary | ICD-10-CM | POA: Diagnosis not present

## 2021-06-27 DIAGNOSIS — I4821 Permanent atrial fibrillation: Secondary | ICD-10-CM | POA: Diagnosis not present

## 2021-06-27 MED ORDER — REPATHA SURECLICK 140 MG/ML ~~LOC~~ SOAJ: 140.0000 mg | SUBCUTANEOUS | 5 refills | Status: DC

## 2021-06-27 NOTE — Progress Notes (Signed)
Chronic Care Management Pharmacy Note  06/27/2021 Name:  [name] MRN:  [id number] DOB:  12-19-1940  Summary: HYPERLIPIDEMIA  Recommendations/Changes made from today's visit: Hyperlipidemia:  New goal. Controlled; current treatment:REPATHA;  Patient unable to afford repatha--has gone without medication due to cost Application submitted for [employer] foundation grant (take copay card to local pharmacy) Medications previously tried: pravastatin, rosuvastatin, simvastatin  Current dietary patterns: recommended FOLLOWING A HEART HEALTHY DIET/HEALTHY PLATE METHOD Assessed patient finances. Application submitted to [employer]well foundation grant Lipid Panel     Component Value Date/Time   CHOL 122 02/17/2021 1105   TRIG 145 02/17/2021 1105   HDL 34 (L) 02/17/2021 1105   CHOLHDL 3.6 02/17/2021 1105   LDLCALC 63 02/17/2021 1105   LABVLDL 25 02/17/2021 1105  Blood pressure &medications reviewed---documented/AT GOAL BP<130/80    Patient Goals/Self-Care Activities patient will:  - take medications as prescribed as evidenced by patient report and record review collaborate with provider on medication access solutions target a minimum of 150 minutes of moderate intensity exercise weekly engage in dietary modifications by FOLLOWING A HEART HEALTHY DIET/HEALTHY PLATE METHOD  Plan: F/U IN 2 MONTHS  Subjective: [name] is an 81 y.o. year old male who is a primary patient of Loman Brooklyn, FNP.  The CCM team was consulted for assistance with disease management and care coordination needs.    Engaged with patient by telephone for initial visit in response to provider referral for pharmacy case management and/or care coordination services.   Consent to Services:  The patient was given the following information about Chronic Care Management services today, agreed to services, and gave verbal consent: 1. CCM service includes personalized support from designated clinical staff  supervised by the primary care provider, including individualized plan of care and coordination with other care providers 2. 24/7 contact phone numbers for assistance for urgent and routine care needs. 3. Service will only be billed when office clinical staff spend 20 minutes or more in a month to coordinate care. 4. Only one practitioner may furnish and bill the service in a calendar month. 5.The patient may stop CCM services at any time (effective at the end of the month) by phone call to the office staff. 6. The patient will be responsible for cost sharing (co-pay) of up to 20% of the service fee (after annual deductible is met). Patient agreed to services and consent obtained.  Patient Care Team: Loman Brooklyn, FNP as PCP - General (Family Medicine) Harl Bowie, Alphonse Guild, MD as PCP - Cardiology (Cardiology) Harl Bowie Alphonse Guild, MD as Consulting Physician (Cardiology) Irine Seal, MD as Attending Physician (Urology) Lavera Guise, [facility] (Pharmacist)  Objective:  Lab Results  Component Value Date   CREATININE 1.03 05/25/2021   CREATININE 1.10 02/17/2021   CREATININE 1.04 11/08/2020    Lab Results  Component Value Date   HGBA1C 5.9 02/17/2021   Last diabetic Eye exam: No results found for: HMDIABEYEEXA  Last diabetic Foot exam: No results found for: HMDIABFOOTEX      Component Value Date/Time   CHOL 122 02/17/2021 1105   TRIG 145 02/17/2021 1105   HDL 34 (L) 02/17/2021 1105   CHOLHDL 3.6 02/17/2021 1105   [place] 63 02/17/2021 1105    Hepatic Function Latest Ref Rng & Units 05/25/2021 02/17/2021 11/08/2020  Total Protein 6.0 - 8.5 g/dL 6.2 6.4 6.6  Albumin 3.6 - 4.6 g/dL 3.8 4.1 4.0  AST 0 - 40 IU/L 26 18 15   ALT 0 -  44 IU/L 33 11 8  Alk Phosphatase 44 - 121 IU/L 56 63 68  Total Bilirubin 0.0 - 1.2 mg/dL 0.3 0.4 0.3    Lab Results  Component Value Date/Time   TSH 4.360 04/07/2019 03:09 PM    CBC Latest Ref Rng & Units 05/25/2021 02/17/2021 11/08/2020  WBC 3.4 - 10.8 x10E3/uL  12.0(H) 6.7 6.0  Hemoglobin 13.0 - 17.7 g/dL 15.6 14.6 15.1  Hematocrit 37.5 - 51.0 % 47.2 44.8 44.9  Platelets 150 - 450 x10E3/uL 339 236 228    Lab Results  Component Value Date/Time   VD25OH 62.6 08/05/2020 09:02 AM   VD25OH 20.4 (L) 04/07/2019 03:09 PM    Clinical ASCVD: No  The ASCVD Risk score (Arnett DK, et al., 2019) failed to calculate for the following reasons:   The 2019 ASCVD risk score is only valid for ages 72 to 84    Other: (CHADS2VASc if Afib, PHQ9 if depression, MMRC or CAT for COPD, ACT, DEXA)  Social History   Tobacco Use  Smoking Status Former   Packs/day: 3.00   Years: 50.00   Pack years: 150.00   Types: Cigarettes   Quit date: 03/18/1990   Years since quitting: 31.2  Smokeless Tobacco Former   Types: Chew   Quit date: 06/18/2000   BP Readings from Last 3 Encounters:  06/27/21 120/78  06/20/21 (!) 161/72  05/25/21 (!) 154/80   Pulse Readings from Last 3 Encounters:  06/20/21 85  05/25/21 93  05/19/21 99   Wt Readings from Last 3 Encounters:  06/20/21 171 lb 9.6 oz (77.8 kg)  05/25/21 169 lb 3.2 oz (76.7 kg)  05/19/21 174 lb 12.8 oz (79.3 kg)    Assessment: Review of patient past medical history, allergies, medications, health status, including review of consultants reports, laboratory and other test data, was performed as part of comprehensive evaluation and provision of chronic care management services.   SDOH:  (Social Determinants of Health) assessments and interventions performed:    CCM Care Plan  Allergies  Allergen Reactions   Asa [Aspirin]     GI upset   Niacin     hot flashes   Pravastatin     myalgias   Rosuvastatin     myalgias   Simvastatin     Myalgias     Medications Reviewed Today     Reviewed by Lavera Guise, [facility] (Pharmacist) on 06/27/21 at 2333  Med List Status: <None>   Medication Order Taking? Sig Documenting Provider Last Dose Status Informant  albuterol (PROVENTIL) (2.5 MG/3ML) 0.083% nebulizer  solution [id number] No Take 3 mLs (2.5 mg total) by nebulization every 6 (six) hours as needed for wheezing or shortness of breath. Loman Brooklyn, FNP Taking Active Self  albuterol (VENTOLIN HFA) 108 (90 Base) MCG/ACT inhaler [id number] No Inhale 2 puffs into the lungs every 6 (six) hours as needed for wheezing or shortness of breath. Loman Brooklyn, FNP Taking Active Self  ARTIFICIAL TEAR OP [id number] No Place 1 drop into the left eye at bedtime. [provider] Taking Active Self  Budeson-Glycopyrrol-Formoterol (BREZTRI AEROSPHERE) 160-9-4.8 MCG/ACT AERO [id number] No Inhale 2 puffs into the lungs in the morning and at bedtime. Loman Brooklyn, FNP Taking Active   Evolocumab [facility] SURECLICK) 945 MG/ML Darden Palmer [id number]  Inject 140 mg into the skin every 14 (fourteen) days. Loman Brooklyn, FNP  Active   fluticasone furoate-vilanterol (BREO ELLIPTA) 100-25 MCG/ACT AEPB [id number] No Inhale 1 puff into the lungs daily. [provider] Taking Active   ketorolac (ACULAR) 0.5 % ophthalmic solution [id number] No Place 1 drop into the left eye 4 (four) times daily. Loman Brooklyn, FNP Taking Active   levalbuterol Penne Lash) 0.63 MG/3ML nebulizer solution [id number] No Inhale into the lungs. [provider] Taking Active   metoprolol tartrate (LOPRESSOR) 25 MG tablet [id number] No Take 1 tablet (25 mg total) by mouth 2 (two) times daily. Arnoldo Lenis, MD Taking Active   omeprazole (PRILOSEC) 20 MG capsule [id number]  TAKE 1 CAPSULE EVERY DAY Loman Brooklyn, FNP  Active   traZODone (DESYREL) 50 MG tablet [id number] No TAKE 1 TABLET AT BEDTIME AS NEEDED FOR SLEEP Loman Brooklyn, FNP Taking Active   Vitamin D, Ergocalciferol, (DRISDOL) 1.25 MG (50000 UNIT) CAPS capsule [id number] No TAKE 1 CAPSULE EVERY 7 DAYS. 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 03/22/2020   Vitamin D insufficiency    Prediabetes 03/31/2018   BPH (benign prostatic hyperplasia) 02/07/2017   Chronic kidney disease (CKD), 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   Vertigo 02/07/2017   Chronic anticoagulation 02/27/2013   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  01/21/2013   Pulmonary nodules 12/19/2012   CAD (coronary artery disease)    HLD (hyperlipidemia) 11/01/2009   ATRIAL FIBRILLATION, CHRONIC 11/01/2009   GERD 11/01/2009    Immunization History  Administered Date(s) Administered   Fluad Quad(high Dose 65+) 03/18/2020   Influenza Whole 03/18/2012   Influenza, High Dose Seasonal PF 03/17/2019   Influenza,inj,Quad PF,6+ Mos 04/02/2017, 03/17/2018   Influenza-Unspecified 03/17/2019, 03/17/2021   Moderna Sars-Covid-2 Vaccination 09/30/2019, 10/27/2019   Pneumococcal Conjugate-13 12/17/2013   Pneumococcal Polysaccharide-23 03/18/2005    Conditions to be addressed/monitored: HTN and HLD  Care Plan : PHARMD MEDICATION MANAGEMENT  Updates made by Lavera Guise, [place] since 06/27/2021 12:00 AM     Problem: DISEASE PROGRESSION PREVENTION      Long-Range Goal: HYPERLIPIDEMIA PHARMD GOAL   This Visit's Progress: On track  Priority: High  Note:   Current Barriers:  Unable to independently afford treatment regimen  Pharmacist Clinical Goal(s):  patient will verbalize ability to afford treatment regimen maintain control of hyperlipidemia as evidenced by goal LDL<70  through collaboration with PharmD and provider.    Interventions: 1:1 collaboration with Loman Brooklyn, FNP regarding development and update of comprehensive plan  of care as evidenced by provider attestation and co-signature Inter-disciplinary care team collaboration (see longitudinal plan of care) Comprehensive medication review performed; medication list updated in electronic medical record  Hyperlipidemia:  New goal. Controlled; current treatment:REPATHA;  Patient unable to afford repatha--has gone without medication due to cost Application submitted for healthwell foundation grant (take copay card to local pharmacy) Medications previously tried: pravastatin, rosuvastatin, simvastatin  Current dietary patterns: recommended FOLLOWING A HEART HEALTHY DIET/HEALTHY PLATE METHOD Assessed patient finances. Application submitted to [employer]well foundation grant Lipid Panel     Component Value Date/Time   CHOL 122 02/17/2021 1105   TRIG 145 02/17/2021 1105   HDL  34 (L) 02/17/2021 1105   CHOLHDL 3.6 02/17/2021 1105   LDLCALC 63 02/17/2021 1105   LABVLDL 25 02/17/2021 1105  Blood pressure &medications reviewed---documented   Patient Goals/Self-Care Activities patient will:  - take medications as prescribed as evidenced by patient report and record review collaborate with provider on medication access solutions target a minimum of 150 minutes of moderate intensity exercise weekly engage in dietary modifications by   FOLLOWING A HEART HEALTHY DIET/HEALTHY PLATE METHOD       Medication Assistance:  [place] REPATHA--PATIENT TO TAKE THAT COPAY CARD TO LOCAL PHARMACY AND PICK UP REPATHA--CAN ONLY DO 1 MONTH SUPPLIES AT [facility]  Patient's preferred pharmacy is: [facility], [place] [place] [address] Phone: [phone] Fax: [place], [place] [address] Phone: [phone] Fax: [phone]  Uses pill box? No - N/A Pt endorses 100% compliance  Follow Up:   Patient agrees to Care Plan and Follow-up.  Plan: Telephone follow up appointment with care management team member scheduled for:  3 MONTHS   Regina Eck, PharmD, BCPS Clinical Pharmacist, [place]  II Phone [phone]

## 2021-06-27 NOTE — Patient Instructions (Signed)
Visit Information  Thank you for taking time to visit with me today. Please don't hesitate to contact me if I can be of assistance to you before our next scheduled telephone appointment.  Following are the goals we discussed today:  Current Barriers:  Unable to independently afford treatment regimen  Pharmacist Clinical Goal(s):  patient will verbalize ability to afford treatment regimen maintain control of hyperlipidemia as evidenced by goal LDL<70  through collaboration with PharmD and provider.    Interventions: 1:1 collaboration with Loman Brooklyn, FNP regarding development and update of comprehensive plan of care as evidenced by provider attestation and co-signature Inter-disciplinary care team collaboration (see longitudinal plan of care) Comprehensive medication review performed; medication list updated in electronic medical record  Hyperlipidemia:  New goal. Controlled; current treatment:REPATHA;  Patient unable to afford repatha--has gone without medication due to cost Application submitted for healthwell foundation grant (take copay card to local pharmacy) Medications previously tried: pravastatin, rosuvastatin, simvastatin  Current dietary patterns: recommended FOLLOWING A HEART HEALTHY DIET/HEALTHY PLATE METHOD Assessed patient finances. Application submitted to [employer]well foundation grant Lipid Panel     Component Value Date/Time   CHOL 122 02/17/2021 1105   TRIG 145 02/17/2021 1105   HDL 34 (L) 02/17/2021 1105   CHOLHDL 3.6 02/17/2021 1105   LDLCALC 63 02/17/2021 1105   LABVLDL 25 02/17/2021 1105  Blood pressure &medications reviewed---documented   Patient Goals/Self-Care Activities patient will:  - take medications as prescribed as evidenced by patient report and record review collaborate with provider on medication access solutions target a minimum of 150 minutes of moderate intensity exercise weekly engage in dietary modifications by FOLLOWING A HEART  HEALTHY DIET/HEALTHY PLATE METHOD  Please call the care guide team at [phone] if you need to cancel or reschedule your appointment.    The patient verbalized understanding of instructions, educational materials, and care plan provided today and declined offer to receive copy of patient instructions, educational materials, and care plan.   Signature Regina Eck, PharmD, BCPS Clinical Pharmacist, [place]  II Phone [phone]

## 2021-06-29 ENCOUNTER — Ambulatory Visit (INDEPENDENT_AMBULATORY_CARE_PROVIDER_SITE_OTHER): Payer: Medicare Other | Admitting: Family Medicine

## 2021-06-29 ENCOUNTER — Encounter: Payer: Self-pay | Admitting: Family Medicine

## 2021-06-29 DIAGNOSIS — J069 Acute upper respiratory infection, unspecified: Secondary | ICD-10-CM | POA: Diagnosis not present

## 2021-06-29 MED ORDER — PREDNISONE 20 MG PO TABS: ORAL_TABLET | ORAL | 0 refills | Status: DC

## 2021-06-29 MED ORDER — AZITHROMYCIN 250 MG PO TABS: ORAL_TABLET | ORAL | 0 refills | Status: DC

## 2021-06-29 NOTE — Progress Notes (Signed)
Virtual Visit via telephone Note Due to COVID-19 pandemic this visit was conducted virtually. This visit type was conducted due to national recommendations for restrictions regarding the COVID-19 Pandemic (e.g. social distancing, sheltering in place) in an effort to limit this patient's exposure and mitigate transmission in our community. All issues noted in this document were discussed and addressed.  A physical exam was not performed with this format.   I connected with [name] on 06/29/2021 at 1100 by telephone and verified that I am speaking with the correct person using two identifiers. [name] is currently located at home and family is currently with them during visit. The provider, Monia Pouch, FNP is located in their office at time of visit.  I discussed the limitations, risks, security and privacy concerns of performing an evaluation and management service by virtual visit and the availability of in person appointments. I also discussed with the patient that there may be a patient responsible charge related to this service. The patient expressed understanding and agreed to proceed.  Subjective:  Patient ID: [name], male    DOB: 1941-01-04, 81 y.o.   MRN: [id number]  Chief Complaint:  COPD and URI   HPI: [name] is a 81 y.o. male presenting on 06/29/2021 for COPD and URI   Pt with history of COPD with URI symptoms over last week, worsening not improving. Has been using Mucinex without relief of symptoms.   URI  This is a new problem. The current episode started in the past 7 days. The problem has been gradually worsening. There has been no fever. Associated symptoms include congestion, coughing, headaches, rhinorrhea, a sore throat and wheezing. Pertinent negatives include no abdominal pain, chest pain, diarrhea, dysuria, ear pain, joint pain, joint swelling, nausea, neck pain, plugged ear sensation, rash, sinus pain, sneezing, swollen glands or  vomiting. He has tried inhaler use and decongestant for the symptoms. The treatment provided no relief.    Relevant past medical, surgical, family, and social history reviewed and updated as indicated.  Allergies and medications reviewed and updated.   Past Medical History:  Diagnosis Date   Anxiety disorder    Benign prostatic hypertrophy    Bilateral cataracts    CAD (coronary artery disease)    Catheterization 2009, mild CAD   Carotid artery disease (HCC)    Doppler, September, 2011, less than 50% bilateral   COPD (chronic obstructive pulmonary disease) (HCC)    CVA (cerebral infarction)    Hospitalization.  MRI...    Left anterior cerebral artery territory, September, 2011... Coumadin started   DYSLIPIDEMIA 11/01/2009   Qualifier: Diagnosis of  By: Ron Parker, MD, [facility], [place]    Ejection fraction    EF 60%   Ejection fraction    EF 55-60%, echo, September, 2011   Gastroesophageal reflux disease    History of deep venous thrombosis (DVT) of distal vein of left lower extremity 08/05/2020   History of left inguinal hernia    Hyperlipidemia    Persistent atrial fibrillation (HCC)        Ptosis of eyelid, left    Surgery in the past   Sweating abnormality    Episodes of sweating appeared to be related to a delayed reaction to niacin   Vitamin D insufficiency     Past Surgical History:  Procedure Laterality Date   APPENDECTOMY     CATARACT EXTRACTION, BILATERAL     INGUINAL HERNIA REPAIR Left 07/02/2019   INGUINAL HERNIA REPAIR  Left 07/01/2020   Procedure: LEFT INGUINAL HERNIA REPAIR WITH MESH;  Surgeon: Virl Cagey, MD;  Location: [facility];  Service: General;  Laterality: Left;   KNEE SURGERY Right 2019   torn ligament?    TRANSURETHRAL RESECTION OF PROSTATE N/A 08/19/2018   Procedure: TRANSURETHRAL RESECTION OF THE PROSTATE (TURP) OF PROSTATE ABCESS;  Surgeon: Alexis Frock, MD;  Location: [facility];  Service: Urology;  Laterality: N/A;    Social History    Socioeconomic History   Marital status: Married    Spouse name: shirley   Number of children: 5   Years of education: 11   Highest education level: 11th grade  Occupational History   Occupation: Retired Statistician   Tobacco Use   Smoking status: Former    Packs/day: 3.00    Years: 50.00    Pack years: 150.00    Types: Cigarettes    Quit date: 03/18/1990    Years since quitting: 31.3   Smokeless tobacco: Former    Types: Chew    Quit date: 06/18/2000  Vaping Use   Vaping Use: Never used  Substance and Sexual Activity   Alcohol use: No    Alcohol/week: 0.0 standard drinks   Drug use: No   Sexual activity: Not Currently  Other Topics Concern   Not on file  Social History Narrative   Quit smoking in 1993. No significant alcohol abuse. Retired from trucking.    Social Determinants of Health   Financial Resource Strain: Low Risk    Difficulty of Paying Living Expenses: Not very hard  Food Insecurity: No Food Insecurity   Worried About Charity fundraiser in the Last Year: Never true   Ran Out of Food in the Last Year: Never true  Transportation Needs: No Transportation Needs   Lack of Transportation (Medical): No   Lack of Transportation (Non-Medical): No  Physical Activity: Insufficiently Active   Days of Exercise per Week: 3 days   Minutes of Exercise per Session: 30 min  Stress: No Stress Concern Present   Feeling of Stress : Not at all  Social Connections: Socially Integrated   Frequency of Communication with Friends and Family: More than three times a week   Frequency of Social Gatherings with Friends and Family: More than three times a week   Attends Religious Services: More than 4 times per year   Active Member of [employer] or Organizations: Yes   Attends Music therapist: More than 4 times per year   Marital Status: Married  Human resources officer Violence: Not At Risk   Fear of Current or Ex-Partner: No   Emotionally Abused: No   Physically Abused:  No   Sexually Abused: No    Outpatient Encounter Medications as of 06/29/2021  Medication Sig   azithromycin (ZITHROMAX Z-PAK) 250 MG tablet As directed   predniSONE (DELTASONE) 20 MG tablet 2 po at sametime daily for 5 days- start tomorrow   albuterol (PROVENTIL) (2.5 MG/3ML) 0.083% nebulizer solution Take 3 mLs (2.5 mg total) by nebulization every 6 (six) hours as needed for wheezing or shortness of breath.   albuterol (VENTOLIN HFA) 108 (90 Base) MCG/ACT inhaler Inhale 2 puffs into the lungs every 6 (six) hours as needed for wheezing or shortness of breath.   ARTIFICIAL TEAR OP Place 1 drop into the left eye at bedtime.   Budeson-Glycopyrrol-Formoterol (BREZTRI AEROSPHERE) 160-9-4.8 MCG/ACT AERO Inhale 2 puffs into the lungs in the morning and at bedtime.   Evolocumab (REPATHA SURECLICK) 177  MG/ML SOAJ Inject 140 mg into the skin every 14 (fourteen) days.   fluticasone furoate-vilanterol (BREO ELLIPTA) 100-25 MCG/ACT AEPB Inhale 1 puff into the lungs daily.   ketorolac (ACULAR) 0.5 % ophthalmic solution Place 1 drop into the left eye 4 (four) times daily.   levalbuterol (XOPENEX) 0.63 MG/3ML nebulizer solution Inhale into the lungs.   metoprolol tartrate (LOPRESSOR) 25 MG tablet Take 1 tablet (25 mg total) by mouth 2 (two) times daily.   omeprazole (PRILOSEC) 20 MG capsule TAKE 1 CAPSULE EVERY DAY   traZODone (DESYREL) 50 MG tablet TAKE 1 TABLET AT BEDTIME AS NEEDED FOR SLEEP   Vitamin D, Ergocalciferol, (DRISDOL) 1.25 MG (50000 UNIT) CAPS capsule TAKE 1 CAPSULE EVERY 7 DAYS.   warfarin (COUMADIN) 5 MG tablet TAKE 1 TABLET DAILY OR AS DIRECTED   No facility-administered encounter medications on file as of 06/29/2021.    Allergies  Allergen Reactions   Asa [Aspirin]     GI upset   Niacin     hot flashes   Pravastatin     myalgias   Rosuvastatin     myalgias   Simvastatin     Myalgias     Review of Systems  Constitutional:  Positive for fatigue. Negative for activity change,  appetite change, chills, diaphoresis, fever and unexpected weight change.  HENT:  Positive for congestion, postnasal drip, rhinorrhea and sore throat. Negative for dental problem, drooling, ear discharge, ear pain, facial swelling, hearing loss, mouth sores, nosebleeds, sinus pressure, sinus pain, sneezing, tinnitus, trouble swallowing and voice change.   Respiratory:  Positive for cough and wheezing. Negative for shortness of breath.   Cardiovascular:  Negative for chest pain.  Gastrointestinal:  Negative for abdominal pain, diarrhea, nausea and vomiting.  Genitourinary:  Negative for decreased urine volume, difficulty urinating and dysuria.  Musculoskeletal:  Negative for joint pain and neck pain.  Skin:  Negative for rash.  Neurological:  Positive for headaches. Negative for dizziness, tremors, seizures, syncope, facial asymmetry, speech difficulty, weakness, light-headedness and numbness.  Psychiatric/Behavioral:  Negative for confusion.   All other systems reviewed and are negative.       Observations/Objective: No vital signs or physical exam, this was a virtual health encounter.  Pt alert and oriented, answers all questions appropriately, and able to speak in full sentences.    Assessment and Plan: [name] was seen today for copd and uri.  Diagnoses and all orders for this visit:  URI with cough and congestion Due to underlying COPD and recent hospital admission, will place on antibiotics and prednisone. Pt aware of red flags which require emergent evaluation and treatment. Medications as prescribed. Continue mucinex. Monitor INR closely over next 2 weeks due to antibiotic therapy.  -     azithromycin (ZITHROMAX Z-PAK) 250 MG tablet; As directed -     predniSONE (DELTASONE) 20 MG tablet; 2 po at sametime daily for 5 days- start tomorrow     Follow Up Instructions: Return in about 2 weeks (around 07/13/2021), or if symptoms worsen or fail to improve.    I discussed the  assessment and treatment plan with the patient. The patient was provided an opportunity to ask questions and all were answered. The patient agreed with the plan and demonstrated an understanding of the instructions.   The patient was advised to call back or seek an in-person evaluation if the symptoms worsen or if the condition fails to improve as anticipated.  The above assessment and management plan was discussed with the  patient. The patient verbalized understanding of and has agreed to the management plan. Patient is aware to call the clinic if they develop any new symptoms or if symptoms persist or worsen. Patient is aware when to return to the clinic for a follow-up visit. Patient educated on when it is appropriate to go to the emergency department.    I provided 15 minutes of time during this telephone encounter.   Monia Pouch, FNP-C [facility] [address] [place], [place] [address] [phone] 06/29/2021

## 2021-07-05 DIAGNOSIS — J441 Chronic obstructive pulmonary disease with (acute) exacerbation: Secondary | ICD-10-CM | POA: Diagnosis not present

## 2021-07-05 DIAGNOSIS — J9601 Acute respiratory failure with hypoxia: Secondary | ICD-10-CM | POA: Diagnosis not present

## 2021-07-05 DIAGNOSIS — I2510 Atherosclerotic heart disease of native coronary artery without angina pectoris: Secondary | ICD-10-CM | POA: Diagnosis not present

## 2021-07-05 DIAGNOSIS — I4821 Permanent atrial fibrillation: Secondary | ICD-10-CM | POA: Diagnosis not present

## 2021-07-05 DIAGNOSIS — N182 Chronic kidney disease, stage 2 (mild): Secondary | ICD-10-CM | POA: Diagnosis not present

## 2021-07-05 DIAGNOSIS — I69351 Hemiplegia and hemiparesis following cerebral infarction affecting right dominant side: Secondary | ICD-10-CM | POA: Diagnosis not present

## 2021-07-07 DIAGNOSIS — I2510 Atherosclerotic heart disease of native coronary artery without angina pectoris: Secondary | ICD-10-CM | POA: Diagnosis not present

## 2021-07-07 DIAGNOSIS — I69351 Hemiplegia and hemiparesis following cerebral infarction affecting right dominant side: Secondary | ICD-10-CM | POA: Diagnosis not present

## 2021-07-07 DIAGNOSIS — J9601 Acute respiratory failure with hypoxia: Secondary | ICD-10-CM | POA: Diagnosis not present

## 2021-07-07 DIAGNOSIS — N182 Chronic kidney disease, stage 2 (mild): Secondary | ICD-10-CM | POA: Diagnosis not present

## 2021-07-07 DIAGNOSIS — J441 Chronic obstructive pulmonary disease with (acute) exacerbation: Secondary | ICD-10-CM | POA: Diagnosis not present

## 2021-07-07 DIAGNOSIS — I4821 Permanent atrial fibrillation: Secondary | ICD-10-CM | POA: Diagnosis not present

## 2021-07-12 DIAGNOSIS — I69351 Hemiplegia and hemiparesis following cerebral infarction affecting right dominant side: Secondary | ICD-10-CM | POA: Diagnosis not present

## 2021-07-12 DIAGNOSIS — I4821 Permanent atrial fibrillation: Secondary | ICD-10-CM | POA: Diagnosis not present

## 2021-07-12 DIAGNOSIS — J441 Chronic obstructive pulmonary disease with (acute) exacerbation: Secondary | ICD-10-CM | POA: Diagnosis not present

## 2021-07-12 DIAGNOSIS — J9601 Acute respiratory failure with hypoxia: Secondary | ICD-10-CM | POA: Diagnosis not present

## 2021-07-12 DIAGNOSIS — N182 Chronic kidney disease, stage 2 (mild): Secondary | ICD-10-CM | POA: Diagnosis not present

## 2021-07-12 DIAGNOSIS — I2510 Atherosclerotic heart disease of native coronary artery without angina pectoris: Secondary | ICD-10-CM | POA: Diagnosis not present

## 2021-07-13 ENCOUNTER — Ambulatory Visit (INDEPENDENT_AMBULATORY_CARE_PROVIDER_SITE_OTHER): Payer: Medicare Other | Admitting: *Deleted

## 2021-07-13 DIAGNOSIS — I4891 Unspecified atrial fibrillation: Secondary | ICD-10-CM

## 2021-07-13 DIAGNOSIS — Z5181 Encounter for therapeutic drug level monitoring: Secondary | ICD-10-CM | POA: Diagnosis not present

## 2021-07-13 LAB — POCT INR: INR: 3.1 — AB (ref 2.0–3.0)

## 2021-07-13 NOTE — Patient Instructions (Signed)
Continue 1 tablet daily except 1/2 tablet on Sundays and Wednesdays Recheck in 4 wks

## 2021-07-15 DIAGNOSIS — I2510 Atherosclerotic heart disease of native coronary artery without angina pectoris: Secondary | ICD-10-CM | POA: Diagnosis not present

## 2021-07-15 DIAGNOSIS — J9601 Acute respiratory failure with hypoxia: Secondary | ICD-10-CM | POA: Diagnosis not present

## 2021-07-15 DIAGNOSIS — N182 Chronic kidney disease, stage 2 (mild): Secondary | ICD-10-CM | POA: Diagnosis not present

## 2021-07-15 DIAGNOSIS — J441 Chronic obstructive pulmonary disease with (acute) exacerbation: Secondary | ICD-10-CM | POA: Diagnosis not present

## 2021-07-15 DIAGNOSIS — I4821 Permanent atrial fibrillation: Secondary | ICD-10-CM | POA: Diagnosis not present

## 2021-07-15 DIAGNOSIS — I69351 Hemiplegia and hemiparesis following cerebral infarction affecting right dominant side: Secondary | ICD-10-CM | POA: Diagnosis not present

## 2021-07-17 ENCOUNTER — Encounter: Payer: Medicare Other | Admitting: Neurology

## 2021-07-18 DIAGNOSIS — E7800 Pure hypercholesterolemia, unspecified: Secondary | ICD-10-CM | POA: Diagnosis not present

## 2021-07-18 DIAGNOSIS — R911 Solitary pulmonary nodule: Secondary | ICD-10-CM | POA: Diagnosis not present

## 2021-07-18 DIAGNOSIS — Z886 Allergy status to analgesic agent status: Secondary | ICD-10-CM | POA: Diagnosis not present

## 2021-07-18 DIAGNOSIS — R0600 Dyspnea, unspecified: Secondary | ICD-10-CM | POA: Diagnosis not present

## 2021-07-18 DIAGNOSIS — Z87891 Personal history of nicotine dependence: Secondary | ICD-10-CM | POA: Diagnosis not present

## 2021-07-18 DIAGNOSIS — Z7901 Long term (current) use of anticoagulants: Secondary | ICD-10-CM | POA: Diagnosis not present

## 2021-07-18 DIAGNOSIS — R531 Weakness: Secondary | ICD-10-CM | POA: Diagnosis not present

## 2021-07-18 DIAGNOSIS — N182 Chronic kidney disease, stage 2 (mild): Secondary | ICD-10-CM | POA: Diagnosis not present

## 2021-07-18 DIAGNOSIS — Z79899 Other long term (current) drug therapy: Secondary | ICD-10-CM | POA: Diagnosis not present

## 2021-07-18 DIAGNOSIS — I509 Heart failure, unspecified: Secondary | ICD-10-CM | POA: Diagnosis not present

## 2021-07-18 DIAGNOSIS — R0602 Shortness of breath: Secondary | ICD-10-CM | POA: Diagnosis not present

## 2021-07-18 DIAGNOSIS — J449 Chronic obstructive pulmonary disease, unspecified: Secondary | ICD-10-CM | POA: Diagnosis not present

## 2021-07-18 DIAGNOSIS — D72829 Elevated white blood cell count, unspecified: Secondary | ICD-10-CM | POA: Diagnosis not present

## 2021-07-18 DIAGNOSIS — F419 Anxiety disorder, unspecified: Secondary | ICD-10-CM | POA: Diagnosis not present

## 2021-07-18 DIAGNOSIS — R5383 Other fatigue: Secondary | ICD-10-CM | POA: Diagnosis not present

## 2021-07-18 DIAGNOSIS — K219 Gastro-esophageal reflux disease without esophagitis: Secondary | ICD-10-CM | POA: Diagnosis not present

## 2021-07-18 DIAGNOSIS — Z8673 Personal history of transient ischemic attack (TIA), and cerebral infarction without residual deficits: Secondary | ICD-10-CM | POA: Diagnosis not present

## 2021-07-18 DIAGNOSIS — Z888 Allergy status to other drugs, medicaments and biological substances status: Secondary | ICD-10-CM | POA: Diagnosis not present

## 2021-07-18 DIAGNOSIS — I4821 Permanent atrial fibrillation: Secondary | ICD-10-CM | POA: Diagnosis not present

## 2021-07-18 DIAGNOSIS — N400 Enlarged prostate without lower urinary tract symptoms: Secondary | ICD-10-CM | POA: Diagnosis not present

## 2021-07-18 DIAGNOSIS — J9811 Atelectasis: Secondary | ICD-10-CM | POA: Diagnosis not present

## 2021-07-21 DIAGNOSIS — I2510 Atherosclerotic heart disease of native coronary artery without angina pectoris: Secondary | ICD-10-CM | POA: Diagnosis not present

## 2021-07-21 DIAGNOSIS — N182 Chronic kidney disease, stage 2 (mild): Secondary | ICD-10-CM | POA: Diagnosis not present

## 2021-07-21 DIAGNOSIS — I69351 Hemiplegia and hemiparesis following cerebral infarction affecting right dominant side: Secondary | ICD-10-CM | POA: Diagnosis not present

## 2021-07-21 DIAGNOSIS — J9601 Acute respiratory failure with hypoxia: Secondary | ICD-10-CM | POA: Diagnosis not present

## 2021-07-21 DIAGNOSIS — J441 Chronic obstructive pulmonary disease with (acute) exacerbation: Secondary | ICD-10-CM | POA: Diagnosis not present

## 2021-07-21 DIAGNOSIS — I4821 Permanent atrial fibrillation: Secondary | ICD-10-CM | POA: Diagnosis not present

## 2021-07-26 ENCOUNTER — Encounter: Payer: Self-pay | Admitting: Family Medicine

## 2021-07-26 ENCOUNTER — Ambulatory Visit (INDEPENDENT_AMBULATORY_CARE_PROVIDER_SITE_OTHER): Payer: Medicare Other | Admitting: Family Medicine

## 2021-07-26 VITALS — BP 164/93 | HR 80 | Temp 97.4°F | Ht 66.0 in | Wt 171.6 lb

## 2021-07-26 DIAGNOSIS — R918 Other nonspecific abnormal finding of lung field: Secondary | ICD-10-CM | POA: Diagnosis not present

## 2021-07-26 DIAGNOSIS — Z09 Encounter for follow-up examination after completed treatment for conditions other than malignant neoplasm: Secondary | ICD-10-CM

## 2021-07-26 DIAGNOSIS — H02056 Trichiasis without entropian left eye, unspecified eyelid: Secondary | ICD-10-CM | POA: Diagnosis not present

## 2021-07-26 DIAGNOSIS — R5383 Other fatigue: Secondary | ICD-10-CM | POA: Diagnosis not present

## 2021-07-26 DIAGNOSIS — J449 Chronic obstructive pulmonary disease, unspecified: Secondary | ICD-10-CM

## 2021-07-26 DIAGNOSIS — D72829 Elevated white blood cell count, unspecified: Secondary | ICD-10-CM

## 2021-07-26 DIAGNOSIS — R0602 Shortness of breath: Secondary | ICD-10-CM

## 2021-07-26 DIAGNOSIS — R7989 Other specified abnormal findings of blood chemistry: Secondary | ICD-10-CM

## 2021-07-26 LAB — URINALYSIS, ROUTINE W REFLEX MICROSCOPIC
Bilirubin, UA: NEGATIVE
Glucose, UA: NEGATIVE
Ketones, UA: NEGATIVE
Leukocytes,UA: NEGATIVE
Nitrite, UA: NEGATIVE
Protein,UA: NEGATIVE
RBC, UA: NEGATIVE
Specific Gravity, UA: 1.02 (ref 1.005–1.030)
Urobilinogen, Ur: 0.2 mg/dL (ref 0.2–1.0)
pH, UA: 5.5 (ref 5.0–7.5)

## 2021-07-26 MED ORDER — KETOROLAC TROMETHAMINE 0.5 % OP SOLN: 1.0000 [drp] | Freq: Four times a day (QID) | OPHTHALMIC | 2 refills | Status: DC

## 2021-07-26 NOTE — Progress Notes (Signed)
Assessment & Plan:  1. Shortness of breath Improved.  Continue inhalers and nebulizer as prescribed. - Brain natriuretic peptide  2. Hospital discharge follow-up  3. Leukocytosis, unspecified type - Urinalysis, Routine w reflex microscopic - CBC with Differential/Platelet  4-6. Fatigue/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/Pulmonary nodules Referring to pulmonology for further work-up regarding abnormal chest CT, pulmonary nodules, and COPD.  Questioning if this is contributing to patient's chronic fatigue. - Ambulatory referral to Pulmonology   Return As scheduled.  [name], MSN, APRN, FNP-C [facility]  Subjective:    Patient ID: [name], male    DOB: Dec 03, 1940, 81 y.o.   MRN: [id number]  Patient Care Team: Loman Brooklyn, FNP as PCP - General (Family Medicine) Harl Bowie Alphonse Guild, MD as PCP - Cardiology (Cardiology) Harl Bowie Alphonse Guild, MD as Consulting Physician (Cardiology) Irine Seal, MD as Attending Physician (Urology) Lavera Guise, [facility] (Pharmacist)   Chief Complaint:  Chief Complaint  Patient presents with   ER follow up     1/31/ [facility]- Dyspnea. C/O fatigue     HPI: [name] is a 81 y.o. male presenting on 07/26/2021 for ER follow up  (1/31/ [facility]- Dyspnea. C/O fatigue )  Patient is accompanied by his wife who he is okay with being present.  Patient was seen at [facility] ER on 07/18/2021 due to shortness of breath.  States he was treated with a nebulizer, that improved his symptoms.  Patient was noted to have an elevated WBC with unclear source of infection.  BNP elevated at 976.  He had a chest CT that showed a stable left upper lobe groundglass pulmonary nodule.  The report also says adenocarcinoma cannot be excluded and to follow-up with CT in 12 months.  He was given a prescription of steroids to take at discharge, which she has completed.  New complaints: His continued concern is fatigue.  In the  past he has been evaluated with lab work, echocardiogram, and carotid Dopplers.  He has had CT scans of his chest, abdomen, and pelvis which only revealed the above on the chest CT.  I had arranged for a sleep study with his neurologist, which the patient canceled.  States he feels like he sleeps good and he got a bill for lab work from the neurology office and did not want to have one for the sleep study.   Social history:  Relevant past medical, surgical, family and social history reviewed and updated as indicated. Interim medical history since our last visit reviewed.  Allergies and medications reviewed and updated.  DATA REVIEWED: CHART IN EPIC  ROS: Negative unless specifically indicated above in HPI.    Current Outpatient Medications:    albuterol (PROVENTIL) (2.5 MG/3ML) 0.083% nebulizer solution, Take 3 mLs (2.5 mg total) by nebulization every 6 (six) hours as needed for wheezing or shortness of breath., Disp: 72 mL, Rfl: 0   albuterol (VENTOLIN HFA) 108 (90 Base) MCG/ACT inhaler, Inhale 2 puffs into the lungs every 6 (six) hours as needed for wheezing or shortness of breath., Disp: 18 g, Rfl: 11   ARTIFICIAL TEAR OP, Place 1 drop into the left eye at bedtime., Disp: , Rfl:    Budeson-Glycopyrrol-Formoterol (BREZTRI AEROSPHERE) 160-9-4.8 MCG/ACT AERO, Inhale 2 puffs into the lungs in the morning and at bedtime., Disp: 10.7 g, Rfl: 12   Evolocumab (REPATHA SURECLICK) 711 MG/ML SOAJ, Inject 140 mg into the skin every 14 (fourteen) days., Disp: 2 mL, Rfl:  5   fluticasone furoate-vilanterol (BREO ELLIPTA) 100-25 MCG/ACT AEPB, Inhale 1 puff into the lungs daily., Disp: , Rfl:    ketorolac (ACULAR) 0.5 % ophthalmic solution, Place 1 drop into the left eye 4 (four) times daily., Disp: 5 mL, Rfl: 2   levalbuterol (XOPENEX) 0.63 MG/3ML nebulizer solution, Inhale into the lungs., Disp: , Rfl:    metoprolol tartrate (LOPRESSOR) 25 MG tablet, Take 1 tablet (25 mg total) by mouth 2 (two) times  daily., Disp: , Rfl:    omeprazole (PRILOSEC) 20 MG capsule, TAKE 1 CAPSULE EVERY DAY, Disp: 90 capsule, Rfl: 0   traZODone (DESYREL) 50 MG tablet, TAKE 1 TABLET AT BEDTIME AS NEEDED FOR SLEEP, Disp: 90 tablet, Rfl: 1   Vitamin D, Ergocalciferol, (DRISDOL) 1.25 MG (50000 UNIT) CAPS capsule, TAKE 1 CAPSULE EVERY 7 DAYS., Disp: 12 capsule, Rfl: 1   warfarin (COUMADIN) 5 MG tablet, TAKE 1 TABLET DAILY OR AS DIRECTED, Disp: 90 tablet, Rfl: 3   Allergies  Allergen Reactions   Asa [Aspirin]     GI upset   Niacin     hot flashes   Pravastatin     myalgias   Rosuvastatin     myalgias   Simvastatin     Myalgias    Past Medical History:  Diagnosis Date   Anxiety disorder    Benign prostatic hypertrophy    Bilateral cataracts    CAD (coronary artery disease)    Catheterization 2009, mild CAD   Carotid artery disease (HCC)    Doppler, September, 2011, less than 50% bilateral   COPD (chronic obstructive pulmonary disease) (HCC)    CVA (cerebral infarction)    Hospitalization.  MRI...    Left anterior cerebral artery territory, September, 2011... Coumadin started   DYSLIPIDEMIA 11/01/2009   Qualifier: Diagnosis of  By: Ron Parker, MD, [facility], [place]    Ejection fraction    EF 60%   Ejection fraction    EF 55-60%, echo, September, 2011   Gastroesophageal reflux disease    History of deep venous thrombosis (DVT) of distal vein of left lower extremity 08/05/2020   History of left inguinal hernia    Hyperlipidemia    Persistent atrial fibrillation (HCC)        Ptosis of eyelid, left    Surgery in the past   Sweating abnormality    Episodes of sweating appeared to be related to a delayed reaction to niacin   Vitamin D insufficiency     Past Surgical History:  Procedure Laterality Date   APPENDECTOMY     CATARACT EXTRACTION, BILATERAL     INGUINAL HERNIA REPAIR Left 07/02/2019   INGUINAL HERNIA REPAIR Left 07/01/2020   Procedure: LEFT INGUINAL HERNIA REPAIR WITH MESH;  Surgeon:  Virl Cagey, MD;  Location: [facility];  Service: General;  Laterality: Left;   KNEE SURGERY Right 2019   torn ligament?    TRANSURETHRAL RESECTION OF PROSTATE N/A 08/19/2018   Procedure: TRANSURETHRAL RESECTION OF THE PROSTATE (TURP) OF PROSTATE ABCESS;  Surgeon: Alexis Frock, MD;  Location: [facility];  Service: Urology;  Laterality: N/A;    Social History   Socioeconomic History   Marital status: Married    Spouse name: shirley   Number of children: 5   Years of education: 11   Highest education level: 11th grade  Occupational History   Occupation: Retired Statistician   Tobacco Use   Smoking status: Former    Packs/day: 3.00    Years: 50.00  Pack years: 150.00    Types: Cigarettes    Quit date: 03/18/1990    Years since quitting: 31.3   Smokeless tobacco: Former    Types: Chew    Quit date: 06/18/2000  Vaping Use   Vaping Use: Never used  Substance and Sexual Activity   Alcohol use: No    Alcohol/week: 0.0 standard drinks   Drug use: No   Sexual activity: Not Currently  Other Topics Concern   Not on file  Social History Narrative   Quit smoking in 1993. No significant alcohol abuse. Retired from trucking.    Social Determinants of Health   Financial Resource Strain: Low Risk    Difficulty of Paying Living Expenses: Not very hard  Food Insecurity: No Food Insecurity   Worried About Charity fundraiser in the Last Year: Never true   Ran Out of Food in the Last Year: Never true  Transportation Needs: No Transportation Needs   Lack of Transportation (Medical): No   Lack of Transportation (Non-Medical): No  Physical Activity: Insufficiently Active   Days of Exercise per Week: 3 days   Minutes of Exercise per Session: 30 min  Stress: No Stress Concern Present   Feeling of Stress : Not at all  Social Connections: Socially Integrated   Frequency of Communication with Friends and Family: More than three times a week   Frequency of Social Gatherings with Friends and  Family: More than three times a week   Attends Religious Services: More than 4 times per year   Active Member of [employer] or Organizations: Yes   Attends Music therapist: More than 4 times per year   Marital Status: Married  Human resources officer Violence: Not At Risk   Fear of Current or Ex-Partner: No   Emotionally Abused: No   Physically Abused: No   Sexually Abused: No        Objective:    BP (!) 164/93    Pulse 80    Temp (!) 97.4 F (36.3 C) (Temporal)    Ht _0  (1.676 m)    Wt 171 lb 9.6 oz (77.8 kg)    SpO2 96%    BMI 27.70 kg/m   Wt Readings from Last 3 Encounters:  07/26/21 171 lb 9.6 oz (77.8 kg)  06/20/21 171 lb 9.6 oz (77.8 kg)  05/25/21 169 lb 3.2 oz (76.7 kg)    Physical Exam Vitals reviewed.  Constitutional:      General: He is not in acute distress.    Appearance: Normal appearance. He is overweight. He is not ill-appearing, toxic-appearing or diaphoretic.  HENT:     Head: Normocephalic and atraumatic.  Eyes:     General: No scleral icterus.       Right eye: No discharge.        Left eye: No discharge.     Conjunctiva/sclera: Conjunctivae normal.  Cardiovascular:     Rate and Rhythm: Normal rate and regular rhythm.     Heart sounds: Normal heart sounds. No murmur heard.   No friction rub. No gallop.  Pulmonary:     Effort: Pulmonary effort is normal. No respiratory distress.     Breath sounds: Normal breath sounds. No stridor. No wheezing, rhonchi or rales.  Musculoskeletal:        General: Normal range of motion.     Cervical back: Normal range of motion.  Skin:    General: Skin is warm and dry.  Neurological:     Mental  Status: He is alert and oriented to person, place, and time. Mental status is at baseline.     Motor: Weakness present.     Gait: Gait abnormal.  Psychiatric:        Mood and Affect: Mood normal.        Behavior: Behavior normal.        Thought Content: Thought content normal.        Judgment: Judgment normal.     Lab Results  Component Value Date   TSH 4.360 04/07/2019   Lab Results  Component Value Date   WBC 12.0 (H) 05/25/2021   HGB 15.6 05/25/2021   HCT 47.2 05/25/2021   MCV 89 05/25/2021   PLT 339 05/25/2021   Lab Results  Component Value Date   NA 143 05/25/2021   K 4.3 05/25/2021   CO2 31 (H) 05/25/2021   GLUCOSE 129 (H) 05/25/2021   BUN 26 05/25/2021   CREATININE 1.03 05/25/2021   BILITOT 0.3 05/25/2021   ALKPHOS 56 05/25/2021   AST 26 05/25/2021   ALT 33 05/25/2021   PROT 6.2 05/25/2021   ALBUMIN 3.8 05/25/2021   CALCIUM 8.9 05/25/2021   ANIONGAP 8 06/29/2020   EGFR 73 05/25/2021   Lab Results  Component Value Date   CHOL 122 02/17/2021   Lab Results  Component Value Date   HDL 34 (L) 02/17/2021   Lab Results  Component Value Date   LDLCALC 63 02/17/2021   Lab Results  Component Value Date   TRIG 145 02/17/2021   Lab Results  Component Value Date   CHOLHDL 3.6 02/17/2021   Lab Results  Component Value Date   HGBA1C 5.9 02/17/2021

## 2021-07-27 LAB — CBC WITH DIFFERENTIAL/PLATELET
Basophils Absolute: 0 10*3/uL (ref 0.0–0.2)
Basos: 0 %
EOS (ABSOLUTE): 0 10*3/uL (ref 0.0–0.4)
Eos: 0 %
Hematocrit: 44.3 % (ref 37.5–51.0)
Hemoglobin: 14.5 g/dL (ref 13.0–17.7)
Immature Grans (Abs): 0 10*3/uL (ref 0.0–0.1)
Immature Granulocytes: 0 %
Lymphocytes Absolute: 1.6 10*3/uL (ref 0.7–3.1)
Lymphs: 16 %
MCH: 29.6 pg (ref 26.6–33.0)
MCHC: 32.7 g/dL (ref 31.5–35.7)
MCV: 90 fL (ref 79–97)
Monocytes Absolute: 0.5 10*3/uL (ref 0.1–0.9)
Monocytes: 5 %
Neutrophils Absolute: 7.7 10*3/uL — ABNORMAL HIGH (ref 1.4–7.0)
Neutrophils: 79 %
Platelets: 333 10*3/uL (ref 150–450)
RBC: 4.9 x10E6/uL (ref 4.14–5.80)
RDW: 12.8 % (ref 11.6–15.4)
WBC: 9.9 10*3/uL (ref 3.4–10.8)

## 2021-07-27 LAB — BRAIN NATRIURETIC PEPTIDE: BNP: 94.9 pg/mL (ref 0.0–100.0)

## 2021-07-31 ENCOUNTER — Encounter: Payer: Self-pay | Admitting: Family Medicine

## 2021-08-10 ENCOUNTER — Ambulatory Visit (INDEPENDENT_AMBULATORY_CARE_PROVIDER_SITE_OTHER): Payer: Medicare Other | Admitting: *Deleted

## 2021-08-10 DIAGNOSIS — Z5181 Encounter for therapeutic drug level monitoring: Secondary | ICD-10-CM

## 2021-08-10 DIAGNOSIS — I4891 Unspecified atrial fibrillation: Secondary | ICD-10-CM | POA: Diagnosis not present

## 2021-08-10 LAB — POCT INR: INR: 2.1 (ref 2.0–3.0)

## 2021-08-10 NOTE — Patient Instructions (Signed)
Continue 1 tablet daily except 1/2 tablet on Sundays and Wednesdays Recheck in 5 wks

## 2021-08-30 ENCOUNTER — Telehealth: Payer: Self-pay

## 2021-08-30 NOTE — Chronic Care Management (AMB) (Signed)
?  Chronic Care Management  ? ?Note ? ?08/30/2021 ?Name: ABIR CRAINE MRN: [id number] DOB: 17-May-1941 ? ?DEONTRE ALLSUP is a 81 y.o. year old male who is a primary care patient of Loman Brooklyn, FNP. SADLER TESCHNER is currently enrolled in care management services. An additional referral for RNCM was placed.  ? ?Follow up plan: ?Patient declines further follow up and engagement by the RN CM. Appropriate care team members and provider have been notified via electronic communication.  ? ?Noreene Larsson, RMA ?Care Guide, Embedded Care Coordination ?[facility]  ?[place], [place] [address] ?Direct Dial: [phone] ?[email or web address] ?Website: [email or web address]  ? ?

## 2021-09-05 ENCOUNTER — Ambulatory Visit (INDEPENDENT_AMBULATORY_CARE_PROVIDER_SITE_OTHER): Payer: Medicare Other | Admitting: Internal Medicine

## 2021-09-05 ENCOUNTER — Other Ambulatory Visit: Payer: Self-pay

## 2021-09-05 ENCOUNTER — Encounter: Payer: Self-pay | Admitting: Internal Medicine

## 2021-09-05 DIAGNOSIS — R911 Solitary pulmonary nodule: Secondary | ICD-10-CM | POA: Diagnosis not present

## 2021-09-05 DIAGNOSIS — J449 Chronic obstructive pulmonary disease, unspecified: Secondary | ICD-10-CM | POA: Diagnosis not present

## 2021-09-05 DIAGNOSIS — R0609 Other forms of dyspnea: Secondary | ICD-10-CM | POA: Diagnosis not present

## 2021-09-05 MED ORDER — BREZTRI AEROSPHERE 160-9-4.8 MCG/ACT IN AERO: 2.0000 | INHALATION_SPRAY | Freq: Two times a day (BID) | RESPIRATORY_TRACT | 0 refills | Status: DC

## 2021-09-05 NOTE — Assessment & Plan Note (Addendum)
s/p smoking cessation in 1991 ?- PFT's FEV1 1.10 (44%) ratio 54 and 41% reversibilty  DLCO 69 corrects to 99%  ?- 09/05/2021  After extensive coaching inhaler device,  effectiveness =  80%  > breztri Take 2 puffs first thing in am and then another 2 puffs about 12 hours later.  ? ? ?When respiratory symptoms begin or become refractory well after a patient reports complete smoking cessation,  Especially when this wasn't the case while they were smoking, a red flag is raised based on the work of Dr Kris Mouton which states: ? if you quit smoking when your best day FEV1 is still well preserved it is highly unlikely you will progress to severe disease. ? ?That is to say, once the smoking stops,  the symptoms should not suddenly erupt or markedly worsen.  If so, the differential diagnosis should include  obesity/deconditioning,  LPR/Reflux/Aspiration syndromes,  occult CHF, or  especially side effect of medications commonly used in this population.   ? ?Will try breztri 2 bid to see if helps ex tol and have pt return for pfts.  In meantime advised on reconditioning  ? ? ?Each maintenance medication was reviewed in detail including emphasizing most importantly the difference between maintenance and prns and under what circumstances the prns are to be triggered using an action plan format where appropriate. ? ?Total time for H and P, extensive chart review, counseling, reviewing hfa  device(s) , directly observing portions of ambulatory 02 saturation study/ and generating customized AVS unique to this "new pt"  office visit / same day charting  > 60  min  ?     ?  ?      ?

## 2021-09-05 NOTE — Progress Notes (Signed)
?Subjective:  ? ? Patient ID: [name], male    DOB: 13-Nov-1940    MRN: [id number] ? ? ?Brief patient profile:  ?27 yowm quit smoking 1991 with cough resolved then recurred in 2011 treated as pna and symptoms since then of recurrent pattern spring and fall cough assoc with nasal symptoms completely resolved  After zpak and prednisone then this episode Dec 2013 never completely resolved and had CT chest 12/09/12 with as dz so referred 12/18/2012 to pulmonary by Dr [name] p starting levaquin 12/15/12. 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 ? ? ?HPI ?12/18/2012 1st pulmonary ov/ [name] cc persistent daily x 6 months variable sometimes worse in am's with mucus variably green. Assoc with mild doe but not adls  ?rec ?Finish levaquin but have inr checked on Monday July 7 ?Stop fish oil ?GERD  ?For cough ok to try delsym cough syrup or mucindex dm up to 1200 every 12 hours  ? ? ?  ? ?03/09/2013 f/u ov/[name] re: copd ?Chief Complaint  ?Patient presents with  ? Follow-up  ?  Pt states his breathing continues to improve, and he denies any new co's today.   ? no limiting sob, rare need for saba  hafa ?rec ?Symbicort 160 mg Take 2 puffs first thing in am and then another 2 puffs about 12 hours later (optional) ?Work on Interior and spatial designer:    ?Only use your albuterol(proaire) as a rescue ? ? ?03/16/2014  Pulmonary office visit/ [name]  / symbicort 160  2 pffs only (did not increase as rec) ?Chief Complaint  ?Patient presents with  ? Followup with CXR  ?  Pt states that he had URI end of Aug 2015- was txed with pred and symptoms improved but have since returned. He c/o lack of energy, cough and increased SOB. Cough is prod with minimal yellow sputum in the am's.  He has only used rescue inhaler x 1 since last visit here.   ?sleeping  well s cough now and no longer using codeine ?Seemed to do the best p rx with prednisone  ?Rec ?Symbicort 160 Take 2 puffs first thing in am and then another 2  puffs about 12 hours later.  ?Only use your albuterol as a rescue medication   ?Work on maintaining perfect inhaler technique:   ? ?? Sepsis from urology surgery around 2020 > never the same stamina since with basline wt 170 ? ?09/05/2021  re-establish  ov/[name] office/[name] re: COPD 3 maint on breztri   ?Chief Complaint  ?Patient presents with  ? Consult  ?  Consult for fatigue abnormal Cta   ? Dyspnea:  200 ft uphill back from the shop could last do it 3 y prior to [name]   ?Cough: clears throat a lot  ?Sleeping: sleeping fine bed is flat with one pillow ?SABA use: once a week  ?02: none  ?Covid status: vax x 2 ? ?No obvious day to day or daytime variability or assoc excess/ purulent sputum or mucus plugs or hemoptysis or cp or chest tightness, subjective wheeze or overt sinus or hb symptoms.  ? ?Sleeping  without nocturnal  or early am exacerbation  of respiratory  c/o's or need for noct saba. Also denies any obvious fluctuation of symptoms with weather or environmental changes or other aggravating or alleviating factors except as outlined above  ? ?No unusual exposure hx or h/o childhood pna/ asthma or knowledge of premature birth. ? ?Current  Allergies, Complete Past Medical History, Past Surgical History, Family History, and Social History were reviewed in [employer] record. ? ?ROS  The following are not active complaints unless bolded ?Hoarseness, sore throat, dysphagia, dental problems, itching, sneezing,  nasal congestion or discharge of excess mucus or purulent secretions, ear ache,   fever, chills, sweats, unintended wt loss or wt gain, classically pleuritic or exertional cp,  orthopnea pnd or arm/hand swelling  or leg swelling, presyncope, palpitations, abdominal pain, anorexia, nausea, vomiting, diarrhea  or change in bowel habits or change in bladder habits, change in stools or change in urine, dysuria, hematuria,  rash, arthralgias, visual complaints, headache, numbness, weakness  or ataxia or problems with walking or coordination,  change in mood or  memory. Balance ?      ? ?Current Meds  ?Medication Sig  ? albuterol (PROVENTIL) (2.5 MG/3ML) 0.083% nebulizer solution Take 3 mLs (2.5 mg total) by nebulization every 6 (six) hours as needed for wheezing or shortness of breath.  ? Budeson-Glycopyrrol-Formoterol (BREZTRI AEROSPHERE) 160-9-4.8 MCG/ACT AERO Inhale 2 puffs into the lungs in the morning and at bedtime.  ? Evolocumab (REPATHA SURECLICK) 419 MG/ML SOAJ Inject 140 mg into the skin every 14 (fourteen) days.  ? metoprolol tartrate (LOPRESSOR) 25 MG tablet Take 1 tablet (25 mg total) by mouth 2 (two) times daily.  ? omeprazole (PRILOSEC) 20 MG capsule TAKE 1 CAPSULE EVERY DAY  ? traZODone (DESYREL) 50 MG tablet TAKE 1 TABLET AT BEDTIME AS NEEDED FOR SLEEP  ? Vitamin D, Ergocalciferol, (DRISDOL) 1.25 MG (50000 UNIT) CAPS capsule TAKE 1 CAPSULE EVERY 7 DAYS.  ? warfarin (COUMADIN) 5 MG tablet TAKE 1 TABLET DAILY OR AS DIRECTED  ?     ? ?  ? ? ?   ?Objective:  ? Physical Exam ? ? wt ? ?09/05/2021        174  ?03/16/2014        174  ?   ?03/09/13 179 lb (81.194 kg)  ?02/27/13 177 lb (80.287 kg)  ?01/21/13 176 lb (79.833 kg)  ?   ? ?Vital signs reviewed  09/05/2021  - Note at rest 02 sats  97% on RA  ? ?General appearance:    somber amb wm rattling cough   ? ? ?HEENT : pt wearing mask not removed for exam due to covid - 19 concerns.  ? ? ?NECK :  without JVD/Nodes/TM/ nl carotid upstrokes bilaterally ? ? ?LUNGS: no acc muscle use,  Mild barrel  contour chest wall with bilateral  Distant bs s audible wheeze and  without cough on insp or exp maneuvers  and mild  Hyperresonant  to  percussion bilaterally   ? ? ?CV:  RRR  no s3 or murmur or increase in P2, and no edema  ? ?ABD:  soft and nontender with pos end  insp Hoover's  in the supine position. No bruits or organomegaly appreciated, bowel sounds nl ? ?MS:   Nl gait/  ext warm without deformities, calf tenderness, cyanosis or clubbing ?No obvious  joint restrictions  ? ?SKIN: warm and dry without lesions   ? ?NEURO:  alert, approp, nl sensorium with  no motor or cerebellar deficits apparent.  ?    ?  ?CTa [facility] 07/18/21  ?Stable LUL GG nodule vs 12/2019 with rec q12 m x 3 years total ? ? ?   ?Assessment & Plan:  ? ?

## 2021-09-05 NOTE — Assessment & Plan Note (Signed)
Onset p urosepsis ? 2019 ?- Echo 10/2010 Mild AR   With severe LAE ?- 09/05/2021   Walked on RA  x  3  lap(s) =  approx 450  ft  @ moderate pace, stopped due to balance > sob  with lowest 02 sats 94% ? ?Most concerned here with deconditioning and accelerated geriactric decline along with significant elevation in Left atrial pressure at rest that may be contributing to significant elevations of pulmonary capillary pressures, esp if Afterload increases for any reason.  ? ?Would need cpst to sort out   ?

## 2021-09-05 NOTE — Assessment & Plan Note (Signed)
CTa [facility] 07/18/21 : Stable LUL GG nodule vs 12/2019 with rec q12 m x 3 years total ? ?PLaced in reminder file to complete the rec f/u scan 07/18/22 but most likely this is benign ? ?Discussed in detail all the  indications, usual  risks and alternatives  relative to the benefits with patient who agrees to proceed with w/u as outlined to close the loop  ?

## 2021-09-05 NOTE — Patient Instructions (Signed)
Breztri Take 2 puffs first thing in am and then another 2 puffs about 12 hours later.  ? ?Work on inhaler technique:  relax and gently blow all the way out then take a nice smooth full deep breath back in, triggering the inhaler at same time you start breathing in.  Hold for up to 5 seconds if you can. Blow out thru nose. Rinse and gargle with water when done.  If mouth or throat bother you at all,  try brushing teeth/gums/tongue with arm and hammer toothpaste/ make a slurry and gargle and spit out.  ? ?  ** remember the golfer warm up = practice swings** ?  ?Ok to try albuterol 15 min before an activity (on alternating days)  that you know would usually make you short of breath and see if it makes any difference and if makes none then don't take albuterol after activity unless you can't catch your breath as this means it's the resting that helps, not the albuterol. ? ?To get the most out of exercise, you need to be continuously aware that you are short of breath, but never out of breath, for at least 30 minutes daily. As you improve, it will actually be easier for you to do the same amount of exercise  in  30 minutes so always push to the level where you are short of breath.   ? ?Return with PFTs when available  ?    ?

## 2021-09-06 ENCOUNTER — Encounter: Payer: Self-pay | Admitting: Family Medicine

## 2021-09-06 ENCOUNTER — Ambulatory Visit (INDEPENDENT_AMBULATORY_CARE_PROVIDER_SITE_OTHER): Payer: Medicare Other | Admitting: Family Medicine

## 2021-09-06 DIAGNOSIS — J441 Chronic obstructive pulmonary disease with (acute) exacerbation: Secondary | ICD-10-CM

## 2021-09-06 MED ORDER — AZITHROMYCIN 250 MG PO TABS: ORAL_TABLET | ORAL | 0 refills | Status: DC

## 2021-09-06 MED ORDER — METHYLPREDNISOLONE 4 MG PO TBPK: ORAL_TABLET | ORAL | 0 refills | Status: DC

## 2021-09-06 NOTE — Progress Notes (Signed)
? ?Virtual Visit via Telephone Note ? ?I connected with [name] on 09/06/21 at 1:30 PM by telephone and verified that I am speaking with the correct person using two identifiers. [name] is currently located at home and nobody is currently with him during this visit. The provider, Loman Brooklyn, FNP is located in their office at time of visit. ? ?I discussed the limitations, risks, security and privacy concerns of performing an evaluation and management service by telephone and the availability of in person appointments. I also discussed with the patient that there may be a patient responsible charge related to this service. The patient expressed understanding and agreed to proceed. ? ?Subjective: ?PCP: Loman Brooklyn, FNP ? ?Chief Complaint  ?Patient presents with  ? Cough  ? ?Patient complains of cough, head/chest congestion, runny nose, sore throat, and wheezing. Cough is productive with more and thicker sputum than usual. He is wheezing more than usual. No shortness of breath. Onset of symptoms was 2 days ago, unchanged since that time. He is drinking plenty of fluids. Treatment to date:  Mucinex . He has a history of COPD. He does not smoke.  ? ? ?ROS: Per HPI ? ?Current Outpatient Medications:  ?  albuterol (PROVENTIL) (2.5 MG/3ML) 0.083% nebulizer solution, Take 3 mLs (2.5 mg total) by nebulization every 6 (six) hours as needed for wheezing or shortness of breath., Disp: 72 mL, Rfl: 0 ?  Budeson-Glycopyrrol-Formoterol (BREZTRI AEROSPHERE) 160-9-4.8 MCG/ACT AERO, Inhale 2 puffs into the lungs in the morning and at bedtime., Disp: 10.7 g, Rfl: 12 ?  Budeson-Glycopyrrol-Formoterol (BREZTRI AEROSPHERE) 160-9-4.8 MCG/ACT AERO, Inhale 2 puffs into the lungs in the morning and at bedtime., Disp: 10.7 g, Rfl: 0 ?  Evolocumab (REPATHA SURECLICK) 413 MG/ML SOAJ, Inject 140 mg into the skin every 14 (fourteen) days., Disp: 2 mL, Rfl: 5 ?  metoprolol tartrate (LOPRESSOR) 25 MG tablet, Take 1 tablet  (25 mg total) by mouth 2 (two) times daily., Disp: , Rfl:  ?  omeprazole (PRILOSEC) 20 MG capsule, TAKE 1 CAPSULE EVERY DAY, Disp: 90 capsule, Rfl: 0 ?  traZODone (DESYREL) 50 MG tablet, TAKE 1 TABLET AT BEDTIME AS NEEDED FOR SLEEP, Disp: 90 tablet, Rfl: 1 ?  Vitamin D, Ergocalciferol, (DRISDOL) 1.25 MG (50000 UNIT) CAPS capsule, TAKE 1 CAPSULE EVERY 7 DAYS., Disp: 12 capsule, Rfl: 1 ?  warfarin (COUMADIN) 5 MG tablet, TAKE 1 TABLET DAILY OR AS DIRECTED, Disp: 90 tablet, Rfl: 3 ? ?Allergies  ?Allergen Reactions  ? Asa [Aspirin]   ?  GI upset  ? Niacin   ?  hot flashes  ? Pravastatin   ?  myalgias  ? Rosuvastatin   ?  myalgias  ? Simvastatin   ?  Myalgias ?  ? ?Past Medical History:  ?Diagnosis Date  ? Anxiety disorder   ? Benign prostatic hypertrophy   ? Bilateral cataracts   ? CAD (coronary artery disease)   ? Catheterization 2009, mild CAD  ? Carotid artery disease ([place])   ? Doppler, September, 2011, less than 50% bilateral  ? COPD (chronic obstructive pulmonary disease) ([place])   ? CVA (cerebral infarction)   ? Hospitalization.  MRI...    Left anterior cerebral artery territory, September, 2011... Coumadin started  ? DYSLIPIDEMIA 11/01/2009  ? Qualifier: Diagnosis of  By: Ron Parker, MD, Caffie Damme   ? Ejection fraction   ? EF 60%  ? Ejection fraction   ? EF 55-60%, echo, September, 2011  ? Gastroesophageal reflux  disease   ? History of deep venous thrombosis (DVT) of distal vein of left lower extremity 08/05/2020  ? History of left inguinal hernia   ? Hyperlipidemia   ? Persistent atrial fibrillation ([place])   ?    ? Ptosis of eyelid, left   ? Surgery in the past  ? Sweating abnormality   ? Episodes of sweating appeared to be related to a delayed reaction to niacin  ? Vitamin D insufficiency   ? ? ?Observations/Objective: ?A&O  ?No respiratory distress or wheezing audible over the phone ?Mood, judgement, and thought processes all WNL ? ?Assessment and Plan: ?1. COPD exacerbation ([place]) ?Encouraged to start daily  antihistamine.  ?- azithromycin (ZITHROMAX Z-PAK) 250 MG tablet; Take 2 tablets (500 mg) PO today, then 1 tablet (250 mg) PO daily x4 days.  Dispense: 6 tablet; Refill: 0 ?- methylPREDNISolone (MEDROL DOSEPAK) 4 MG TBPK tablet; Use as directed  Dispense: 21 each; Refill: 0 ? ? ?Follow Up Instructions: ? ?I discussed the assessment and treatment plan with the patient. The patient was provided an opportunity to ask questions and all were answered. The patient agreed with the plan and demonstrated an understanding of the instructions. ?  ?The patient was advised to call back or seek an in-person evaluation if the symptoms worsen or if the condition fails to improve as anticipated. ? ?The above assessment and management plan was discussed with the patient. The patient verbalized understanding of and has agreed to the management plan. Patient is aware to call the clinic if symptoms persist or worsen. Patient is aware when to return to the clinic for a follow-up visit. Patient educated on when it is appropriate to go to the emergency department.  ? ?Time call ended: 1:41 PM ? ?I provided 11 minutes of non-face-to-face time during this encounter. ? ?[name], MSN, APRN, FNP-C ?[place] ?09/06/21 ?

## 2021-09-14 ENCOUNTER — Ambulatory Visit (INDEPENDENT_AMBULATORY_CARE_PROVIDER_SITE_OTHER): Payer: Medicare Other | Admitting: *Deleted

## 2021-09-14 DIAGNOSIS — I4891 Unspecified atrial fibrillation: Secondary | ICD-10-CM | POA: Diagnosis not present

## 2021-09-14 DIAGNOSIS — Z5181 Encounter for therapeutic drug level monitoring: Secondary | ICD-10-CM | POA: Diagnosis not present

## 2021-09-14 LAB — POCT INR: INR: 2.6 (ref 2.0–3.0)

## 2021-09-14 NOTE — Patient Instructions (Signed)
Continue 1 tablet daily except 1/2 tablet on Sundays and Wednesdays Recheck in 6 wks 

## 2021-09-18 ENCOUNTER — Ambulatory Visit (HOSPITAL_COMMUNITY)
Admission: RE | Admit: 2021-09-18 | Discharge: 2021-09-18 | Disposition: A | Discharge status: Home / Self Care | Payer: Medicare Other | Source: Ambulatory Visit | Attending: Internal Medicine | Admitting: Internal Medicine

## 2021-09-18 DIAGNOSIS — J449 Chronic obstructive pulmonary disease, unspecified: Secondary | ICD-10-CM | POA: Diagnosis not present

## 2021-09-18 LAB — PULMONARY FUNCTION TEST
DL/VA % pred: 83 %
DL/VA: 3.28 ml/min/mmHg/L
DLCO unc % pred: 68 %
DLCO unc: 14.65 ml/min/mmHg
FEF 25-75 Post: 1.01 L/sec
FEF 25-75 Pre: 0.54 L/sec
FEF2575-%Change-Post: 87 %
FEF2575-%Pred-Post: 66 %
FEF2575-%Pred-Pre: 35 %
FEV1-%Change-Post: 25 %
FEV1-%Pred-Post: 65 %
FEV1-%Pred-Pre: 51 %
FEV1-Post: 1.49 L
FEV1-Pre: 1.18 L
FEV1FVC-%Change-Post: 0 %
FEV1FVC-%Pred-Pre: 77 %
FEV6-%Change-Post: 20 %
FEV6-%Pred-Post: 82 %
FEV6-%Pred-Pre: 67 %
FEV6-Post: 2.49 L
FEV6-Pre: 2.06 L
FEV6FVC-%Change-Post: -3 %
FEV6FVC-%Pred-Post: 99 %
FEV6FVC-%Pred-Pre: 103 %
FVC-%Change-Post: 25 %
FVC-%Pred-Post: 82 %
FVC-%Pred-Pre: 65 %
FVC-Post: 2.71 L
FVC-Pre: 2.16 L
Post FEV1/FVC ratio: 55 %
Post FEV6/FVC ratio: 92 %
Pre FEV1/FVC ratio: 55 %
Pre FEV6/FVC Ratio: 95 %
RV % pred: 178 %
RV: 4.43 L
TLC % pred: 107 %
TLC: 6.76 L

## 2021-09-18 MED ORDER — ALBUTEROL SULFATE (2.5 MG/3ML) 0.083% IN NEBU
2.5000 mg | INHALATION_SOLUTION | Freq: Once | RESPIRATORY_TRACT | Status: AC
  Administered 2021-09-18: 2.5 mg via RESPIRATORY_TRACT

## 2021-09-19 ENCOUNTER — Ambulatory Visit (INDEPENDENT_AMBULATORY_CARE_PROVIDER_SITE_OTHER): Payer: Medicare Other | Admitting: Family Medicine

## 2021-09-19 ENCOUNTER — Encounter: Payer: Self-pay | Admitting: Family Medicine

## 2021-09-19 VITALS — BP 135/85 | HR 85 | Temp 97.7°F | Ht 67.0 in | Wt 171.6 lb

## 2021-09-19 DIAGNOSIS — R140 Abdominal distension (gaseous): Secondary | ICD-10-CM

## 2021-09-19 DIAGNOSIS — Z23 Encounter for immunization: Secondary | ICD-10-CM | POA: Diagnosis not present

## 2021-09-19 DIAGNOSIS — I4891 Unspecified atrial fibrillation: Secondary | ICD-10-CM | POA: Diagnosis not present

## 2021-09-19 DIAGNOSIS — K219 Gastro-esophageal reflux disease without esophagitis: Secondary | ICD-10-CM | POA: Diagnosis not present

## 2021-09-19 DIAGNOSIS — G479 Sleep disorder, unspecified: Secondary | ICD-10-CM

## 2021-09-19 DIAGNOSIS — R7303 Prediabetes: Secondary | ICD-10-CM

## 2021-09-19 DIAGNOSIS — R5382 Chronic fatigue, unspecified: Secondary | ICD-10-CM | POA: Diagnosis not present

## 2021-09-19 DIAGNOSIS — E559 Vitamin D deficiency, unspecified: Secondary | ICD-10-CM | POA: Diagnosis not present

## 2021-09-19 DIAGNOSIS — N182 Chronic kidney disease, stage 2 (mild): Secondary | ICD-10-CM | POA: Diagnosis not present

## 2021-09-19 DIAGNOSIS — I2510 Atherosclerotic heart disease of native coronary artery without angina pectoris: Secondary | ICD-10-CM | POA: Diagnosis not present

## 2021-09-19 DIAGNOSIS — J449 Chronic obstructive pulmonary disease, unspecified: Secondary | ICD-10-CM

## 2021-09-19 DIAGNOSIS — E782 Mixed hyperlipidemia: Secondary | ICD-10-CM | POA: Diagnosis not present

## 2021-09-19 LAB — BAYER DCA HB A1C WAIVED: HB A1C (BAYER DCA - WAIVED): 5.9 % — ABNORMAL HIGH (ref 4.8–5.6)

## 2021-09-19 MED ORDER — TRAZODONE HCL 50 MG PO TABS: 50.0000 mg | ORAL_TABLET | Freq: Every day | ORAL | 1 refills | Status: DC

## 2021-09-19 NOTE — Patient Instructions (Signed)
Try taking a probiotic to see if it helps with bloating. ?

## 2021-09-19 NOTE — Progress Notes (Signed)
? ?Assessment & Plan:  ?1. Prediabetes ?Labs to assess. ?- Bayer DCA Hb A1c Waived ? ?2. Vitamin D insufficiency ?Labs to assess. Option to come off weekly supplement if level is still normal. ?- VITAMIN D 25 Hydroxy (Vit-D Deficiency, Fractures) ? ?3. Mixed hyperlipidemia ?Well controlled on current regimen.  ?- CBC with Differential/Platelet ?- CMP14+EGFR ?- Lipid panel ? ?4. Coronary artery disease involving native coronary artery of native heart without angina pectoris ?Well controlled on current regimen.  ?- CMP14+EGFR ?- Lipid panel ? ?5. Atrial fibrillation, unspecified type ([place]) ?Well controlled on current regimen. Managed by cardiology. ?- CBC with Differential/Platelet ?- CMP14+EGFR ? ?6. 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  ?Well controlled on current regimen. Managed by Dr. Melvyn Novas. ? ?7. Gastroesophageal reflux disease, unspecified whether esophagitis present ?Well controlled on current regimen.  ?- CMP14+EGFR ? ?8. Chronic fatigue ?Continue work-up with pulmonology. Encouraged patient to reschedule sleep study. ? ?9. Difficulty sleeping ?Encouraged patient to reschedule sleep study. ?- traZODone (DESYREL) 50 MG tablet; Take 1 tablet (50 mg total) by mouth at bedtime.  Dispense: 90 tablet; Refill: 1 ? ?10. Bloating ?Encouraged to try probiotic. Education provided on bloating. ? ?11. Immunization due ?- Varicella-zoster vaccine IM (Shingrix) ? ? ?Return in about 4 months (around 01/19/2022) for follow-up of chronic medication conditions. ? ?[name], MSN, APRN, FNP-C ?[place] ? ?Subjective:  ? ? Patient ID: [name], male    DOB: 30-Dec-1940, 81 y.o.   MRN: [id number] ? ?Patient Care Team: ?Loman Brooklyn, FNP as PCP - General (Family Medicine) ?Arnoldo Lenis, MD as PCP - Cardiology (Cardiology) ?Arnoldo Lenis, MD as Consulting Physician (Cardiology) ?Irine Seal, MD as Attending Physician (Urology) ?Lavera Guise, [facility] (Pharmacist)  ? ?Chief Complaint:   ?Chief Complaint  ?Patient presents with  ? Medical Management of Chronic Issues  ? Fatigue  ?  Ongoing 3-4 years  ? ? ?HPI: ?[name] is a 81 y.o. male presenting on 09/19/2021 for Medical Management of Chronic Issues and Fatigue (Ongoing 3-4 years) ? ?Patient is accompanied by his wife who he is okay with being present. ? ?COPD: Patient is doing well with Breztri.  He has an albuterol inhaler to use if needed, as well as a nebulizer. He last saw pulmonology on 09/05/2021.  He had PFTs completed yesterday. ? ?Hyperlipidemia/CAD: Patient was previously going to the lipid clinic.  He is doing Repatha injections every 2 weeks which he reports he is doing well with.   ? ?A-Fib: Managed by cardiology.  Patient goes to the anticoagulation clinic for INR monitoring and Coumadin adjustment.   ? ?Prediabetes: most recent A1c 5.9 seven months ago. ? ?Vitamin D Insufficiency: taking weekly vitamin D supplement. Last level normal in February 2022. ? ?GERD: taking Prilosec. ? ?Vitamin D insufficiency: Patient continues to take vitamin D supplement once weekly. ? ?Gait abnormality: patient was referred to neurology where he was told he does not have Parkinson's disease after they watched him walk down the hallway. He reports today he is dragging his feet when he gets really tired. States he feels tired and just doesn't have any energy all the time. He has been reporting fatigue for at least 3 years now. I previously arranged for his neurologist to assess for sleep apnea. A sleep study was ordered, but patient cancelled it.  ? ?New complaints: ?Patient reports he always looks bloated. Denies abdominal pain, constipation or diarrhea; he has regular bowel movements.  ? ? ?  Social history: ? ?Relevant past medical, surgical, family and social history reviewed and updated as indicated. Interim medical history since our last visit reviewed. ? ?Allergies and medications reviewed and updated. ? ?DATA REVIEWED: CHART IN EPIC ? ?ROS:  Negative unless specifically indicated above in HPI.  ? ? ?Current Outpatient Medications:  ?  albuterol (PROVENTIL) (2.5 MG/3ML) 0.083% nebulizer solution, Take 3 mLs (2.5 mg total) by nebulization every 6 (six) hours as needed for wheezing or shortness of breath., Disp: 72 mL, Rfl: 0 ?  Budeson-Glycopyrrol-Formoterol (BREZTRI AEROSPHERE) 160-9-4.8 MCG/ACT AERO, Inhale 2 puffs into the lungs in the morning and at bedtime., Disp: 10.7 g, Rfl: 12 ?  Budeson-Glycopyrrol-Formoterol (BREZTRI AEROSPHERE) 160-9-4.8 MCG/ACT AERO, Inhale 2 puffs into the lungs in the morning and at bedtime., Disp: 10.7 g, Rfl: 0 ?  Evolocumab (REPATHA SURECLICK) 106 MG/ML SOAJ, Inject 140 mg into the skin every 14 (fourteen) days., Disp: 2 mL, Rfl: 5 ?  metoprolol tartrate (LOPRESSOR) 25 MG tablet, Take 1 tablet (25 mg total) by mouth 2 (two) times daily., Disp: , Rfl:  ?  omeprazole (PRILOSEC) 20 MG capsule, TAKE 1 CAPSULE EVERY DAY, Disp: 90 capsule, Rfl: 0 ?  traZODone (DESYREL) 50 MG tablet, TAKE 1 TABLET AT BEDTIME AS NEEDED FOR SLEEP, Disp: 90 tablet, Rfl: 1 ?  Vitamin D, Ergocalciferol, (DRISDOL) 1.25 MG (50000 UNIT) CAPS capsule, TAKE 1 CAPSULE EVERY 7 DAYS., Disp: 12 capsule, Rfl: 1 ?  warfarin (COUMADIN) 5 MG tablet, TAKE 1 TABLET DAILY OR AS DIRECTED, Disp: 90 tablet, Rfl: 3  ? ?Allergies  ?Allergen Reactions  ? Asa [Aspirin]   ?  GI upset  ? Niacin   ?  hot flashes  ? Pravastatin   ?  myalgias  ? Rosuvastatin   ?  myalgias  ? Simvastatin   ?  Myalgias ?  ? ?Past Medical History:  ?Diagnosis Date  ? Anxiety disorder   ? Benign prostatic hypertrophy   ? Bilateral cataracts   ? CAD (coronary artery disease)   ? Catheterization 2009, mild CAD  ? Carotid artery disease ([place])   ? Doppler, September, 2011, less than 50% bilateral  ? COPD (chronic obstructive pulmonary disease) ([place])   ? CVA (cerebral infarction)   ? Hospitalization.  MRI...    Left anterior cerebral artery territory, September, 2011... Coumadin started  ? DYSLIPIDEMIA  11/01/2009  ? Qualifier: Diagnosis of  By: Ron Parker, MD, Caffie Damme   ? Ejection fraction   ? EF 60%  ? Ejection fraction   ? EF 55-60%, echo, September, 2011  ? Gastroesophageal reflux disease   ? History of deep venous thrombosis (DVT) of distal vein of left lower extremity 08/05/2020  ? History of left inguinal hernia   ? Hyperlipidemia   ? Persistent atrial fibrillation ([place])   ?    ? Ptosis of eyelid, left   ? Surgery in the past  ? Sweating abnormality   ? Episodes of sweating appeared to be related to a delayed reaction to niacin  ? Vitamin D insufficiency   ?  ?Past Surgical History:  ?Procedure Laterality Date  ? APPENDECTOMY    ? CATARACT EXTRACTION, BILATERAL    ? INGUINAL HERNIA REPAIR Left 07/02/2019  ? INGUINAL HERNIA REPAIR Left 07/01/2020  ? Procedure: LEFT INGUINAL HERNIA REPAIR WITH MESH;  Surgeon: Virl Cagey, MD;  Location: [facility];  Service: General;  Laterality: Left;  ? KNEE SURGERY Right 2019  ? torn ligament?   ? TRANSURETHRAL RESECTION OF  PROSTATE N/A 08/19/2018  ? Procedure: TRANSURETHRAL RESECTION OF THE PROSTATE (TURP) OF PROSTATE ABCESS;  Surgeon: Alexis Frock, MD;  Location: [facility];  Service: Urology;  Laterality: N/A;  ?  ?Social History  ? ?Socioeconomic History  ? Marital status: Married  ?  Spouse name: shirley  ? Number of children: 5  ? Years of education: 72  ? Highest education level: 11th grade  ?Occupational History  ? Occupation: Retired Statistician   ?Tobacco Use  ? Smoking status: Former  ?  Packs/day: 3.00  ?  Years: 50.00  ?  Pack years: 150.00  ?  Types: Cigarettes  ?  Quit date: 03/18/1990  ?  Years since quitting: 31.5  ? Smokeless tobacco: Former  ?  Types: Chew  ?  Quit date: 06/18/2000  ?Vaping Use  ? Vaping Use: Never used  ?Substance and Sexual Activity  ? Alcohol use: No  ?  Alcohol/week: 0.0 standard drinks  ? Drug use: No  ? Sexual activity: Not Currently  ?Other Topics Concern  ? Not on file  ?Social History Narrative  ? Quit smoking in 1993. No  significant alcohol abuse. Retired from trucking.   ? ?Social Determinants of Health  ? ?Financial Resource Strain: Low Risk   ? Difficulty of Paying Living Expenses: Not very hard  ?Food Insecurity: No Food I

## 2021-09-20 ENCOUNTER — Telehealth: Payer: Self-pay | Admitting: Family Medicine

## 2021-09-20 DIAGNOSIS — J449 Chronic obstructive pulmonary disease, unspecified: Secondary | ICD-10-CM

## 2021-09-20 LAB — CBC WITH DIFFERENTIAL/PLATELET
Basophils Absolute: 0 10*3/uL (ref 0.0–0.2)
Basos: 0 %
EOS (ABSOLUTE): 0.1 10*3/uL (ref 0.0–0.4)
Eos: 1 %
Hematocrit: 47.8 % (ref 37.5–51.0)
Hemoglobin: 15.4 g/dL (ref 13.0–17.7)
Immature Grans (Abs): 0 10*3/uL (ref 0.0–0.1)
Immature Granulocytes: 0 %
Lymphocytes Absolute: 1.8 10*3/uL (ref 0.7–3.1)
Lymphs: 21 %
MCH: 29.2 pg (ref 26.6–33.0)
MCHC: 32.2 g/dL (ref 31.5–35.7)
MCV: 91 fL (ref 79–97)
Monocytes Absolute: 0.8 10*3/uL (ref 0.1–0.9)
Monocytes: 9 %
Neutrophils Absolute: 6 10*3/uL (ref 1.4–7.0)
Neutrophils: 69 %
Platelets: 246 10*3/uL (ref 150–450)
RBC: 5.28 x10E6/uL (ref 4.14–5.80)
RDW: 12.4 % (ref 11.6–15.4)
WBC: 8.8 10*3/uL (ref 3.4–10.8)

## 2021-09-20 LAB — CMP14+EGFR
ALT: 11 IU/L (ref 0–44)
AST: 18 IU/L (ref 0–40)
Albumin/Globulin Ratio: 1.9 (ref 1.2–2.2)
Albumin: 4.1 g/dL (ref 3.6–4.6)
Alkaline Phosphatase: 63 IU/L (ref 44–121)
BUN/Creatinine Ratio: 13 (ref 10–24)
BUN: 18 mg/dL (ref 8–27)
Bilirubin Total: 0.4 mg/dL (ref 0.0–1.2)
CO2: 27 mmol/L (ref 20–29)
Calcium: 9.3 mg/dL (ref 8.6–10.2)
Chloride: 103 mmol/L (ref 96–106)
Creatinine, Ser: 1.4 mg/dL — ABNORMAL HIGH (ref 0.76–1.27)
Globulin, Total: 2.2 g/dL (ref 1.5–4.5)
Glucose: 110 mg/dL — ABNORMAL HIGH (ref 70–99)
Potassium: 4.3 mmol/L (ref 3.5–5.2)
Sodium: 143 mmol/L (ref 134–144)
Total Protein: 6.3 g/dL (ref 6.0–8.5)
eGFR: 50 mL/min/{1.73_m2} — ABNORMAL LOW (ref >59)

## 2021-09-20 LAB — LIPID PANEL
Chol/HDL Ratio: 3.1 ratio (ref 0.0–5.0)
Cholesterol, Total: 113 mg/dL (ref 100–199)
HDL: 36 mg/dL — ABNORMAL LOW (ref >39)
LDL Chol Calc (NIH): 41 mg/dL (ref 0–99)
Triglycerides: 228 mg/dL — ABNORMAL HIGH (ref 0–149)
VLDL Cholesterol Cal: 36 mg/dL (ref 5–40)

## 2021-09-20 LAB — VITAMIN D 25 HYDROXY (VIT D DEFICIENCY, FRACTURES): Vit D, 25-Hydroxy: 91.7 ng/mL (ref 30.0–100.0)

## 2021-09-20 MED ORDER — ALBUTEROL SULFATE (2.5 MG/3ML) 0.083% IN NEBU: 2.5000 mg | INHALATION_SOLUTION | Freq: Four times a day (QID) | RESPIRATORY_TRACT | 0 refills | Status: DC | PRN

## 2021-09-20 NOTE — Telephone Encounter (Signed)
Pt aware refill sent to pharmacy 

## 2021-09-20 NOTE — Telephone Encounter (Signed)
?  Prescription Request ? ?09/20/2021 ? ?Is this a "Controlled Substance" medicine? no ? ?Have you seen your PCP in the last 2 weeks? no ? ?If YES, route message to pool  -  If NO, patient needs to be scheduled for appointment. ? ?What is the name of the medication or equipment? Albuterol 2.5 MG/3ML ? ?Have you contacted your pharmacy to request a refill? NO  ? ?Which pharmacy would you like this sent to? [facility] in [place] ? ? ?Patient notified that their request is being sent to the clinical staff for review and that they should receive a response within 2 business days.  ?  ?

## 2021-09-29 ENCOUNTER — Other Ambulatory Visit: Payer: Self-pay | Admitting: Family Medicine

## 2021-09-29 DIAGNOSIS — E559 Vitamin D deficiency, unspecified: Secondary | ICD-10-CM

## 2021-10-01 NOTE — Telephone Encounter (Signed)
Please let patient know his vitamin D level is now normal and he does not need to continue the weekly supplement. He can start doing OTC vitamin D3 1,000 units daily.  ?

## 2021-10-02 NOTE — Telephone Encounter (Signed)
Patient aware and verbalizes understanding. 

## 2021-10-23 ENCOUNTER — Telehealth: Payer: Self-pay

## 2021-10-23 NOTE — Telephone Encounter (Signed)
Authorization already on file for this request. Authorization starting on 10/17/2020 and ending on 06/17/2022. ?

## 2021-10-26 ENCOUNTER — Ambulatory Visit (INDEPENDENT_AMBULATORY_CARE_PROVIDER_SITE_OTHER): Payer: Medicare Other | Admitting: *Deleted

## 2021-10-26 DIAGNOSIS — I4891 Unspecified atrial fibrillation: Secondary | ICD-10-CM | POA: Diagnosis not present

## 2021-10-26 DIAGNOSIS — Z5181 Encounter for therapeutic drug level monitoring: Secondary | ICD-10-CM

## 2021-10-26 LAB — POCT INR: INR: 2.1 (ref 2.0–3.0)

## 2021-10-26 NOTE — Patient Instructions (Signed)
Continue 1 tablet daily except 1/2 tablet on Sundays and Wednesdays Recheck in 6 wks 

## 2021-11-16 DIAGNOSIS — M79661 Pain in right lower leg: Secondary | ICD-10-CM | POA: Diagnosis not present

## 2021-11-16 DIAGNOSIS — S8011XA Contusion of right lower leg, initial encounter: Secondary | ICD-10-CM | POA: Diagnosis not present

## 2021-11-16 DIAGNOSIS — N183 Chronic kidney disease, stage 3 unspecified: Secondary | ICD-10-CM | POA: Diagnosis not present

## 2021-11-16 DIAGNOSIS — I509 Heart failure, unspecified: Secondary | ICD-10-CM | POA: Diagnosis not present

## 2021-11-16 DIAGNOSIS — W228XXA Striking against or struck by other objects, initial encounter: Secondary | ICD-10-CM | POA: Diagnosis not present

## 2021-11-16 DIAGNOSIS — Z7901 Long term (current) use of anticoagulants: Secondary | ICD-10-CM | POA: Diagnosis not present

## 2021-11-16 DIAGNOSIS — J449 Chronic obstructive pulmonary disease, unspecified: Secondary | ICD-10-CM | POA: Diagnosis not present

## 2021-11-16 DIAGNOSIS — M79604 Pain in right leg: Secondary | ICD-10-CM | POA: Diagnosis not present

## 2021-11-16 DIAGNOSIS — E7800 Pure hypercholesterolemia, unspecified: Secondary | ICD-10-CM | POA: Diagnosis not present

## 2021-11-16 DIAGNOSIS — I4891 Unspecified atrial fibrillation: Secondary | ICD-10-CM | POA: Diagnosis not present

## 2021-11-16 DIAGNOSIS — Z87891 Personal history of nicotine dependence: Secondary | ICD-10-CM | POA: Diagnosis not present

## 2021-11-22 ENCOUNTER — Telehealth: Payer: Self-pay

## 2021-11-22 DIAGNOSIS — E782 Mixed hyperlipidemia: Secondary | ICD-10-CM

## 2021-11-22 MED ORDER — REPATHA SURECLICK 140 MG/ML ~~LOC~~ SOAJ: 140.0000 mg | SUBCUTANEOUS | 3 refills | Status: DC

## 2021-11-22 NOTE — Telephone Encounter (Signed)
Pt called and requested to speak with Allean Found, CMA. Pt stated he was out of his Repatha. Message sent to [facility].

## 2021-11-22 NOTE — Telephone Encounter (Signed)
Returned a call to pt and they stated that they actually didn't call me

## 2021-11-22 NOTE — Telephone Encounter (Signed)
Returned a call to pt and they stated that all they needed was a refill in which I did and they voiced gratitude and understanding

## 2021-12-04 ENCOUNTER — Encounter: Payer: Self-pay | Admitting: Internal Medicine

## 2021-12-04 ENCOUNTER — Ambulatory Visit (INDEPENDENT_AMBULATORY_CARE_PROVIDER_SITE_OTHER): Payer: Medicare Other | Admitting: Internal Medicine

## 2021-12-04 ENCOUNTER — Ambulatory Visit: Payer: Medicare Other | Admitting: Internal Medicine

## 2021-12-04 ENCOUNTER — Other Ambulatory Visit: Payer: Self-pay | Admitting: Internal Medicine

## 2021-12-04 DIAGNOSIS — R911 Solitary pulmonary nodule: Secondary | ICD-10-CM

## 2021-12-04 DIAGNOSIS — I2510 Atherosclerotic heart disease of native coronary artery without angina pectoris: Secondary | ICD-10-CM

## 2021-12-04 DIAGNOSIS — R0609 Other forms of dyspnea: Secondary | ICD-10-CM

## 2021-12-04 DIAGNOSIS — J449 Chronic obstructive pulmonary disease, unspecified: Secondary | ICD-10-CM | POA: Diagnosis not present

## 2021-12-04 MED ORDER — FAMOTIDINE 20 MG PO TABS: ORAL_TABLET | ORAL | 11 refills | Status: DC

## 2021-12-04 MED ORDER — PANTOPRAZOLE SODIUM 40 MG PO TBEC: 40.0000 mg | DELAYED_RELEASE_TABLET | Freq: Every day | ORAL | 2 refills | Status: DC

## 2021-12-04 NOTE — Patient Instructions (Signed)
Plan A = Automatic = Always=    Breztri Take 2 puffs first thing in am and then another 2 puffs about 12 hours later.     Plan B = Backup (to supplement plan A, not to replace it) Only use your albuterol inhaler as a rescue medication to be used if you can't catch your breath by resting or doing a relaxed purse lip breathing pattern.  - The less you use it, the better it will work when you need it. - Ok to use the inhaler up to 2 puffs  every 4 hours if you must but call for appointment if use goes up over your usual need - Don't leave home without it !!  (think of it like the spare tire for your car)   Plan C = Crisis (instead of Plan B but only if Plan B stops working) - only use your albuterol nebulizer if you first try Plan B and it fails to help > ok to use the nebulizer up to every 4 hours but if start needing it regularly call for immediate appointment    Ok to try albuterol 15 min before an activity (on alternating days between the inhaler / nebulizer and nothing )  that you know would usually make you short of breath and see if it makes any difference and if makes none then don't take albuterol after activity unless you can't catch your breath as this means it's the resting that helps, not the albuterol.   Pantoprazole (protonix) 40 mg   Take  30-60 min before first meal of the day and Pepcid (famotidine)  20 mg after supper until return to office - this is the best way to tell whether stomach acid is contributing to your problem.     GERD (REFLUX)  is an extremely common cause of respiratory symptoms just like yours , many times with no obvious heartburn at all.    It can be treated with medication, but also with lifestyle changes including elevation of the head of your bed (ideally with 6 -8inch blocks under the headboard of your bed),  Smoking cessation, avoidance of late meals, excessive alcohol, and avoid fatty foods, chocolate, peppermint, colas, red wine, and acidic juices such  as orange juice.  NO MINT OR MENTHOL PRODUCTS SO NO COUGH DROPS  USE SUGARLESS CANDY INSTEAD (Jolley ranchers or Stover's or Life Savers) or even ice chips will also do - the key is to swallow to prevent all throat clearing. NO OIL BASED VITAMINS - use powdered substitutes.  Avoid fish oil when coughing.   Please schedule a follow up office visit in 6 weeks

## 2021-12-04 NOTE — Progress Notes (Unsigned)
Subjective:    Patient ID: [name], male    DOB: 08-May-1941    MRN: [id number]   Brief patient profile:  79 yowm quit smoking 1991 with cough resolved then recurred in 2011 treated as pna and symptoms since then of recurrent pattern spring and fall cough assoc with nasal symptoms completely resolved  After zpak and prednisone then this episode Dec 2013 never completely resolved and had CT chest 12/09/12 with as dz so referred 12/18/2012 to pulmonary by Dr Scotty Court p starting levaquin 12/15/12. 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 Assoc with mild doe but not adls  rec Finish levaquin but have inr checked on Monday July 7 Stop fish oil GERD  For cough ok to try delsym cough syrup or mucindex dm up to 1200 every 12 hours       03/09/2013 f/u ov/[name] re: copd Chief Complaint  Patient presents with   Follow-up    Pt states his breathing continues to improve, and he denies any new co's today.    no limiting sob, rare need for saba  hafa rec Symbicort 160 mg Take 2 puffs first thing in am and then another 2 puffs about 12 hours later (optional) Work on Engineer, technical sales technique:    Only use your albuterol(proaire) as a rescue   03/16/2014  Pulmonary [name] visit/ [name]  / symbicort 160  2 pffs only (did not increase as rec) Chief Complaint  Patient presents with   Followup with CXR    Pt states that he had URI end of Aug 2015- was txed with pred and symptoms improved but have since returned. He c/o lack of energy, cough and increased SOB. Cough is prod with minimal yellow sputum in the am's.  He has only used rescue inhaler x 1 since last visit here.   sleeping  well s cough now and no longer using codeine Seemed to do the best p rx with prednisone  Rec Symbicort 160 Take 2 puffs first thing in am and then another 2  puffs about 12 hours later.  Only use your albuterol as a rescue medication   Work on maintaining perfect inhaler technique:    ? Sepsis from urology surgery around 2020 > never the same stamina since with basline wt 170    09/05/2021  re-establish  ov/[name]/[name] re: COPD 3 maint on breztri   Chief Complaint  Patient presents with   Consult    Consult for fatigue abnormal Cta    Dyspnea:  200 ft uphill back from the shop could last do it 3 y prior to OV   Cough: clears throat a lot  Sleeping: sleeping fine bed is flat with one pillow SABA use: once a week  02: none  Covid status: vax x 2 Rec Breztri Take 2 puffs first thing in am and then another 2 puffs about 12 hours later.  Work on inhaler technique:  Ok to try albuterol 15 min before an activity (on alternating days)  that you know would usually make you short of breath and see if it makes any difference and if makes none then don't take albuterol after activity unless you can't catch your breath as this means it's the resting that helps, not the albuterol.       .12/04/2021  f/u ov/[place]  [name]/[name] re: COPD GOLD 2  maint on breztri 2bid   Chief Complaint  Patient presents with   Follow-up    Pft done on 09/28/2021   Dyspnea:  mb is 250 yards has to stop maybe half way energy gives out as well as breathing  Cough: variable mucoid min vol day > noct  Sleeping: flat bed one pillow no resp cc  SABA use: rarely / never prechallenges 02: none      No obvious day to day or daytime variability or assoc excess/ purulent sputum or mucus plugs or hemoptysis or cp or chest tightness, subjective wheeze or overt sinus or hb symptoms.   Sleeping  without nocturnal  or early am exacerbation  of respiratory  c/o's or need for noct saba. Also denies any obvious fluctuation of symptoms with weather or environmental changes or other aggravating or alleviating factors except as outlined above   No unusual exposure hx or  h/o childhood pna/ asthma or knowledge of premature birth.  Current Allergies, Complete Past Medical History, Past Surgical History, Family History, and Social History were reviewed in [employer] record.  ROS  The following are not active complaints unless bolded Hoarseness, sore throat, dysphagia, dental problems, itching, sneezing,  nasal congestion or discharge of excess mucus or purulent secretions, ear ache,   fever, chills, sweats, unintended wt loss or wt gain, classically pleuritic or exertional cp,  orthopnea pnd or arm/hand swelling  or leg swelling, presyncope, palpitations, abdominal pain, anorexia, nausea, vomiting, diarrhea  or change in bowel habits or change in bladder habits, change in stools or change in urine, dysuria, hematuria,  rash, arthralgias, visual complaints, headache, numbness, weakness or ataxia or problems with walking or coordination,  change in mood or  memory.        Current Meds  Medication Sig   albuterol (PROVENTIL) (2.5 MG/3ML) 0.083% nebulizer solution Take 3 mLs (2.5 mg total) by nebulization every 6 (six) hours as needed for wheezing or shortness of breath.   Budeson-Glycopyrrol-Formoterol (BREZTRI AEROSPHERE) 160-9-4.8 MCG/ACT AERO Inhale 2 puffs into the lungs in the morning and at bedtime.   Evolocumab (REPATHA SURECLICK) 203 MG/ML SOAJ Inject 140 mg into the skin every 14 (fourteen) days.   metoprolol tartrate (LOPRESSOR) 25 MG tablet Take 1 tablet (25 mg total) by mouth 2 (two) times daily.   omeprazole (PRILOSEC) 20 MG capsule TAKE 1 CAPSULE EVERY DAY   traZODone (DESYREL) 50 MG tablet Take 1 tablet (50 mg total) by mouth at bedtime.   warfarin (COUMADIN) 5 MG tablet TAKE 1 TABLET DAILY OR AS DIRECTED              Objective:   Physical Exam   Wt  12/04/2021        170  09/05/2021        174  03/16/2014        174     03/09/13 179 lb (81.194 kg)  02/27/13 177 lb (80.287 kg)  01/21/13 176 lb (79.833 kg)     Vital  signs reviewed  12/04/2021  - Note at rest 02 sats  97% on RA   General appearance:    amb pleasant mod hoarse wm nad  / prominent upper airway wheeze ? More on L   HEENT : Oropharynx  clear   Nasal turbinates nl    NECK :  without  apparent JVD/ palpable Nodes/TM    LUNGS: no acc muscle use,  Mild barrel  contour chest  wall with bilateral  Distant bs s audible wheeze and  without cough on insp or exp maneuvers  and mild  Hyperresonant  to  percussion bilaterally     CV:  RRR  no s3 or murmur or increase in P2, and no edema   ABD:  soft and nontender with pos end  insp Hoover's  in the supine position.  No bruits or organomegaly appreciated   MS:  Nl gait/ ext warm without deformities Or obvious joint restrictions  calf tenderness, cyanosis or clubbing     SKIN: warm and dry without lesions    NEURO:  alert, approp, nl sensorium with  no motor or cerebellar deficits apparent.           Assessment & Plan:

## 2021-12-05 ENCOUNTER — Encounter: Payer: Self-pay | Admitting: Internal Medicine

## 2021-12-05 NOTE — Assessment & Plan Note (Addendum)
CTa [facility] 07/18/21  Stable LUL GG nodule vs 12/2019 with rec q12 m x 3 years total  Upper airway quality wheeze on exam s signifianct findings on CTa as above so rec rx for GERD and f/u in 6 weeks, sooner if needed  Each maintenance medication was reviewed in detail including emphasizing most importantly the difference between maintenance and prns and under what circumstances the prns are to be triggered using an action plan format where appropriate.  Total time for H and P, chart review, counseling, reviewing hfa device(s) , directly observing portions of ambulatory 02 saturation study/ and generating customized AVS unique to this office visit / same day charting    > 30 min for multiple  refractory respiratory  symptoms of uncertain etiology

## 2021-12-05 NOTE — Assessment & Plan Note (Addendum)
Quit smoking 1991 - PFT's 2014  FEV1 1.10 (44%) ratio 54 and 41% reversibilty  DLCO 69 corrects to 99% and 41% improvement p saba  - 09/05/2021  After extensive coaching inhaler device,  effectiveness =  80%  > breztri Take 2 puffs first thing in am and then another 2 puffs about 12 hours later. - PFT's  09/18/21  FEV1 1.49  (65 % ) ratio 0.55  p 25 % improvement from saba p ? prior to study with DLCO  14.68 (68%)   and FV curve mildly concave   - 12/04/2021  After extensive coaching inhaler device,  effectiveness =  80%  - 12/04/2021   Walked on RA  x  3  lap(s) =  approx 450  ft  @ mod pace, stopped due to end of study c/o feeling "wobbly" but no sob  with lowest 02 sats 94 % - Labs ordered 12/04/2021      alpha one AT phenotype     Group D (now reclassified as E) in terms of symptom/risk and laba/lama/ICS  therefore appropriate rx at this point >>>  Continue breztri and approp saba  Again advised: Re SABA :  I spent extra time with pt today reviewing appropriate use of albuterol for prn use on exertion with the following points: 1) saba is for relief of sob that does not improve by walking a slower pace or resting but rather if the pt does not improve after trying this first. 2) If the pt is convinced, as many are, that saba helps recover from activity faster then it's easy to tell if this is the case by re-challenging : ie stop, take the inhaler, then p 5 minutes try the exact same activity (intensity of workload) that just caused the symptoms and see if they are substantially diminished or not after saba 3) if there is an activity that reproducibly causes the symptoms, try the saba 15 min before the activity on alternate days   If in fact the saba really does help, then fine to continue to use it prn but advised may need to look closer at the maintenance regimen being used to achieve better control of airways disease with exertion.

## 2021-12-05 NOTE — Assessment & Plan Note (Signed)
Onset p urosepsis ? 2019 - Echo 10/27/20  Mild AR   With severe LAE - 09/05/2021   Walked on RA  x  3  lap(s) =  approx 450  ft  @ moderate pace, stopped due to balance > sob  with lowest 02 sats 94%  - 12/04/2021 repeated walking sats still 94% "felt wobbly" but appeared to have nl gait  Labs reviewed and look fine including bnp (despite above echo) but needs TSH to complete the w/u for doe

## 2021-12-08 LAB — TSH: TSH: 4.16 u[IU]/mL (ref 0.450–4.500)

## 2021-12-08 LAB — ALPHA-1-ANTITRYPSIN PHENOTYP: A-1 Antitrypsin: 143 mg/dL (ref 101–187)

## 2021-12-14 ENCOUNTER — Ambulatory Visit (INDEPENDENT_AMBULATORY_CARE_PROVIDER_SITE_OTHER): Payer: Medicare Other | Admitting: *Deleted

## 2021-12-14 DIAGNOSIS — I4891 Unspecified atrial fibrillation: Secondary | ICD-10-CM

## 2021-12-14 DIAGNOSIS — Z5181 Encounter for therapeutic drug level monitoring: Secondary | ICD-10-CM

## 2021-12-14 LAB — POCT INR: INR: 2.3 (ref 2.0–3.0)

## 2021-12-14 NOTE — Patient Instructions (Signed)
Continue 1 tablet daily except 1/2 tablet on Sundays and Wednesdays Recheck in 6 wks

## 2021-12-15 ENCOUNTER — Other Ambulatory Visit: Payer: Self-pay | Admitting: Cardiology

## 2021-12-15 ENCOUNTER — Other Ambulatory Visit: Payer: Self-pay | Admitting: Family Medicine

## 2021-12-15 DIAGNOSIS — H02056 Trichiasis without entropian left eye, unspecified eyelid: Secondary | ICD-10-CM

## 2021-12-15 DIAGNOSIS — I4891 Unspecified atrial fibrillation: Secondary | ICD-10-CM

## 2021-12-29 ENCOUNTER — Ambulatory Visit (INDEPENDENT_AMBULATORY_CARE_PROVIDER_SITE_OTHER): Payer: Medicare Other | Admitting: Cardiology

## 2021-12-29 ENCOUNTER — Encounter: Payer: Self-pay | Admitting: Cardiology

## 2021-12-29 VITALS — BP 110/60 | HR 78 | Wt 169.6 lb

## 2021-12-29 DIAGNOSIS — I6523 Occlusion and stenosis of bilateral carotid arteries: Secondary | ICD-10-CM

## 2021-12-29 DIAGNOSIS — N182 Chronic kidney disease, stage 2 (mild): Secondary | ICD-10-CM | POA: Diagnosis not present

## 2021-12-29 DIAGNOSIS — I2510 Atherosclerotic heart disease of native coronary artery without angina pectoris: Secondary | ICD-10-CM | POA: Diagnosis not present

## 2021-12-29 DIAGNOSIS — I482 Chronic atrial fibrillation, unspecified: Secondary | ICD-10-CM

## 2021-12-29 DIAGNOSIS — E782 Mixed hyperlipidemia: Secondary | ICD-10-CM | POA: Diagnosis not present

## 2021-12-29 DIAGNOSIS — R600 Localized edema: Secondary | ICD-10-CM

## 2021-12-29 MED ORDER — FUROSEMIDE 20 MG PO TABS
20.0000 mg | ORAL_TABLET | ORAL | 3 refills | Status: DC | PRN
Start: 2021-12-29 — End: 2022-02-21

## 2021-12-29 NOTE — Patient Instructions (Signed)
Medication Instructions:  Begin Lasix '20mg'$  as needed for swelling  Continue all other medications.     Labwork: BMET - order given today Office will contact with results via phone, letter or mychart.     Testing/Procedures: none  Follow-Up: 6 months   Any Other Special Instructions Will Be Listed Below (If Applicable).   If you need a refill on your cardiac medications before your next appointment, please call your pharmacy.

## 2021-12-29 NOTE — Progress Notes (Signed)
Clinical Summary [name] is a 81 y.o.male seen today for follow up of the following medical problems.      1. Chronic afib -  DOACs were too expensive in the past, he has elected to remain on coumadin.  - some fatigue on lopressor '100mg'$  bid, has been maintained on lower doses.   - seen by EP, did not meet criteria to consider watchman device   - no recent palpitations - no severe bleeding on coumadin   2. Carotid stenosis - 02/2015 carotid US: bilateral 1-39% ICA disease.   - 10/2020 mild bilateral disease   3. Hyperlipidemia - joint pains on simvaststin, atorvastatin, rosuvastatin, pravastatin. - he is on repatha, followed lipid clinic 02/2021 TC 122 TG 145 HDL 34 LDL 63 09/2021 TC 113 TG 228 HDL 36 LDL 41     4. CAD - mild disease by cath 2009 - - no chest pains   5. LE edema - occasional LE edema. Asking for prn diuretic     6. Aortic regurgitation - moderate AI by echo Jan 2020 - 10/2020 echo mild AI    7. 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 DVT  - 07/2020 [place] with deep vein thrombosis involving the left common femoral vein, femoral vein, popliteal vein and profundus femoral vein. - occurred after left inguinal hernia repair. Note coumadin was held in periop period, prior to that his INR had been subtherapeutic.    -if needs to be off in the future would consider bridging.  - he reports pcp working on [place] assistance. Reports a copay of 30-40$ dollars would be too much     9. 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 [place]         Past Medical History:  Diagnosis Date   Anxiety disorder    Benign prostatic hypertrophy    Bilateral cataracts    CAD (coronary artery disease)    Catheterization 2009, mild CAD   Carotid artery disease (HCC)    Doppler, September, 2011, less than 50% bilateral   COPD (chronic obstructive pulmonary disease) (HCC)    CVA (cerebral  infarction)    Hospitalization.  MRI...    Left anterior cerebral artery territory, September, 2011... Coumadin started   DYSLIPIDEMIA 11/01/2009   Qualifier: Diagnosis of  By: Ron Parker, MD, [facility], [place]    Ejection fraction    EF 60%   Ejection fraction    EF 55-60%, echo, September, 2011   Gastroesophageal reflux disease    History of deep venous thrombosis (DVT) of distal vein of left lower extremity 08/05/2020   History of left inguinal hernia    Hyperlipidemia    Persistent atrial fibrillation (HCC)        Ptosis of eyelid, left    Surgery in the past   Sweating abnormality    Episodes of sweating appeared to be related to a delayed reaction to niacin   Vitamin D insufficiency      Allergies  Allergen Reactions   Asa [Aspirin]     GI upset   Niacin     hot flashes   Pravastatin     myalgias   Rosuvastatin     myalgias   Simvastatin     Myalgias      Current Outpatient Medications  Medication Sig Dispense Refill   albuterol (PROVENTIL) (2.5 MG/3ML) 0.083% nebulizer solution Take 3  mLs (2.5 mg total) by nebulization every 6 (six) hours as needed for wheezing or shortness of breath. 120 mL 0   Budeson-Glycopyrrol-Formoterol (BREZTRI AEROSPHERE) 160-9-4.8 MCG/ACT AERO Inhale 2 puffs into the lungs in the morning and at bedtime. 10.7 g 0   Evolocumab (REPATHA SURECLICK) 413 MG/ML SOAJ Inject 140 mg into the skin every 14 (fourteen) days. 6 mL 3   famotidine (PEPCID) 20 MG tablet One after supper 30 tablet 11   ketorolac (ACULAR) 0.5 % ophthalmic solution PLACE 1 DROP INTO THE LEFT EYE 4 TIMES DAILY. 15 mL 0   metoprolol tartrate (LOPRESSOR) 25 MG tablet Take 1 tablet (25 mg total) by mouth 2 (two) times daily.     pantoprazole (PROTONIX) 40 MG tablet Take 1 tablet (40 mg total) by mouth daily. Take 30-60 min before first meal of the day 30 tablet 2   traZODone (DESYREL) 50 MG tablet Take 1 tablet (50 mg total) by mouth at bedtime. 90 tablet 1   warfarin (COUMADIN) 5  MG tablet TAKE 1 TABLET EVERY DAY OR AS DIRECTED 90 tablet 1   No current facility-administered medications for this visit.     Past Surgical History:  Procedure Laterality Date   APPENDECTOMY     CATARACT EXTRACTION, BILATERAL     INGUINAL HERNIA REPAIR Left 07/02/2019   INGUINAL HERNIA REPAIR Left 07/01/2020   Procedure: LEFT INGUINAL HERNIA REPAIR WITH MESH;  Surgeon: Virl Cagey, MD;  Location: [facility];  Service: General;  Laterality: Left;   KNEE SURGERY Right 2019   torn ligament?    TRANSURETHRAL RESECTION OF PROSTATE N/A 08/19/2018   Procedure: TRANSURETHRAL RESECTION OF THE PROSTATE (TURP) OF PROSTATE ABCESS;  Surgeon: Alexis Frock, MD;  Location: [facility];  Service: Urology;  Laterality: N/A;     Allergies  Allergen Reactions   Asa [Aspirin]     GI upset   Niacin     hot flashes   Pravastatin     myalgias   Rosuvastatin     myalgias   Simvastatin     Myalgias       Family History  Problem Relation Age of Onset   Heart attack Mother    Other Other        Insignificant for premature CAD   Heart disease Sister    Hyperlipidemia Sister    Kidney disease Son    Heart attack Paternal Grandfather      Social History [name] reports that he quit smoking about 31 years ago. His smoking use included cigarettes. He has a 150.00 pack-year smoking history. He quit smokeless tobacco use about 21 years ago.  His smokeless tobacco use included chew. [name] reports no history of alcohol use.   Review of Systems CONSTITUTIONAL: No weight loss, fever, chills, weakness or fatigue.  HEENT: Eyes: No visual loss, blurred vision, double vision or yellow sclerae.No hearing loss, sneezing, congestion, runny nose or sore throat.  SKIN: No rash or itching.  CARDIOVASCULAR: per hpi RESPIRATORY: No shortness of breath, cough or sputum.  GASTROINTESTINAL: No anorexia, nausea, vomiting or diarrhea. No abdominal pain or blood.  GENITOURINARY: No burning on  urination, no polyuria NEUROLOGICAL: No headache, dizziness, syncope, paralysis, ataxia, numbness or tingling in the extremities. No change in bowel or bladder control.  MUSCULOSKELETAL: No muscle, back pain, joint pain or stiffness.  LYMPHATICS: No enlarged nodes. No history of splenectomy.  PSYCHIATRIC: No history of depression or anxiety.  ENDOCRINOLOGIC: No reports of sweating, cold or heat  intolerance. No polyuria or polydipsia.  Marland Kitchen   Physical Examination Today's Vitals   12/29/21 0918  BP: 110/60  Pulse: 78  SpO2: 99%  Weight: 169 lb 9.6 oz (76.9 kg)   Body mass index is 26.56 kg/m.  Gen: resting comfortably, no acute distress HEENT: no scleral icterus, pupils equal round and reactive, no palptable cervical adenopathy,  CV: irreg, no m/r/g no jvd Resp: Clear to auscultation bilaterally GI: abdomen is soft, non-tender, non-distended, normal bowel sounds, no hepatosplenomegaly MSK: extremities are warm, no edema.  Skin: warm, no rash Neuro:  no focal deficits Psych: appropriate affect   Diagnostic Studies  02/2015 carotid US: bilateral 1-39% ICA disease. Recs for f/u 2 years   02/2008 cath FINDINGS:  The aortic pressure 98/71 with a mean of 83, left ventricular  pressure 99/20.    Coronary angiography.  The left mainstem is very short.  There is no  stenosis present.    The LAD is a large-caliber vessel that courses down and reaches the LV  apex.  The anatomy is a bit unusual as there is a Ilia Dimaano off the  proximal LAD that runs the course of an intermediate.  It supplies  multiple Kortnee Bas vessels to the lateral wall.  There has been more of a  traditional appearing diagonal Coreon Simkins from further down in the mid-LAD.  The LAD at the origin of the second diagonal Anaalicia Reimann has a 30% stenosis.  Further down in the vessel, there is minor nonobstructive plaque but no  significant stenosis in the LAD or its Yarieliz Wasser vessels.  There are two  diagonals in the intermediate territory  Jeret Goyer but all arising from the  proximal and mid LAD.    The left circumflex is very small.  It courses down the AV groove and  supplies no significant Machelle Raybon vessels.    Right coronary artery.  The right coronary artery is dominant.  Proximally, there is an eccentric 50% stenosis.  This is a smooth  appearing lesion of the vessels.  The midportion of the right coronary  artery is dilated.  There is a transition zone into more normal-  appearing vessel where there is a 30% stenosis.  There is a large acute  marginal Maytal Mijangos and a moderate size PDA and posterolateral Keelie Zemanek.  There are no other significant stenoses noted.  With intracoronary  nitroglycerin, the lesion still appears no worse than 50%.    Left ventriculography shows low normal LV function with an LVEF in the  range of 50-55%.  There was no significant mitral regurgitation.    ASSESSMENT:  1. Mild-to-moderate right coronary artery stenosis as outlined above.  2. Minimal left anterior descending and left circumflex stenosis.  3. Normal left ventricular function.    Post cat PLAN:  Recommend medical therapy for nonobstructive CAD.  The patient  will resume Coumadin tonight.  He will drop his aspirin dose to 81 mg.  In reviewing Dr. Ron Parker notes, the patient may not require long-term  Coumadin.  Recommend aggressive secondary risk reduction for the  patient's diffuse nonobstructive CAD.     08/2016 event monitor Telemetry tracings show rate controlled atrial fibrillation No symptoms reported     10/2020 echo IMPRESSIONS     1. Left ventricular ejection fraction, by estimation, is 60 to 65%. The  left ventricle has normal function. The left ventricle has no regional  wall motion abnormalities. Left ventricular diastolic parameters are  indeterminate. Elevated left atrial  pressure.   2. Right ventricular  systolic function is normal. The right ventricular  size is normal.   3. Left atrial size was severely dilated.    4. Right atrial size was severely dilated.   5. The mitral valve is normal in structure. Trivial mitral valve  regurgitation. No evidence of mitral stenosis.   6. The aortic valve has an indeterminant number of cusps. There is mild  calcification of the aortic valve. There is mild thickening of the aortic  valve. Aortic valve regurgitation is mild. No aortic stenosis is present.   7. The inferior vena cava is normal in size with greater than 50%  respiratory variability, suggesting right atrial pressure of 3 mmHg.      Assessment and Plan   1. Chronic afib/acquired thrombophilia - DOACs have been too expensive, he prefers staying on coumadin - no symptoms, continue current meds   2. CAD - mild disease on prior cath - no recent symptoms   3. Carotid stenosis - mild disease by recent US - we will continue to monitor   4. Aortic regurgitation - mild by recent echo - continue to monitor at this time   5. HYperlipidemia - intolerant to multiple statins - LDL at goal on repatha, we discussed dietary changes to lower TGs  6. LE edema - given Rx for prn lasix, discussed to take sparingly - elevated in Cr 09/2021 labs, repeat bmet   F/u 6 months    Arnoldo Lenis, M.D.

## 2022-01-01 ENCOUNTER — Other Ambulatory Visit (INDEPENDENT_AMBULATORY_CARE_PROVIDER_SITE_OTHER): Payer: Medicare Other

## 2022-01-01 DIAGNOSIS — Z5181 Encounter for therapeutic drug level monitoring: Secondary | ICD-10-CM

## 2022-01-01 DIAGNOSIS — N182 Chronic kidney disease, stage 2 (mild): Secondary | ICD-10-CM | POA: Diagnosis not present

## 2022-01-01 DIAGNOSIS — R600 Localized edema: Secondary | ICD-10-CM | POA: Diagnosis not present

## 2022-01-01 DIAGNOSIS — I4891 Unspecified atrial fibrillation: Secondary | ICD-10-CM

## 2022-01-02 LAB — BASIC METABOLIC PANEL
BUN/Creatinine Ratio: 17 (ref 10–24)
BUN: 21 mg/dL (ref 8–27)
CO2: 26 mmol/L (ref 20–29)
Calcium: 9.3 mg/dL (ref 8.6–10.2)
Chloride: 105 mmol/L (ref 96–106)
Creatinine, Ser: 1.25 mg/dL (ref 0.76–1.27)
Glucose: 105 mg/dL — ABNORMAL HIGH (ref 70–99)
Potassium: 4.7 mmol/L (ref 3.5–5.2)
Sodium: 144 mmol/L (ref 134–144)
eGFR: 57 mL/min/{1.73_m2} — ABNORMAL LOW (ref >59)

## 2022-01-18 ENCOUNTER — Telehealth: Payer: Self-pay | Admitting: Family Medicine

## 2022-01-18 DIAGNOSIS — J449 Chronic obstructive pulmonary disease, unspecified: Secondary | ICD-10-CM

## 2022-01-18 MED ORDER — BREZTRI AEROSPHERE 160-9-4.8 MCG/ACT IN AERO: 2.0000 | INHALATION_SPRAY | Freq: Two times a day (BID) | RESPIRATORY_TRACT | 11 refills | Status: DC

## 2022-01-18 NOTE — Telephone Encounter (Signed)
Which pharmacy would you like this sent to? [facility] [phone] [address] [place], [place]   Pt says that he has been getting this medication free. I asked him if he called DR Melvyn Novas to get a refill and he said no because on the box it has [facility] name on it. Pt says the rx has no more refills. I told him he would have to come in for an apt. He then said again that he was getting the medication free. Please call back

## 2022-01-18 NOTE — Telephone Encounter (Signed)
  Prescription Request  01/18/2022  Is this a "Controlled Substance" medicine? Budeson-Glycopyrrol-Formoterol (BREZTRI AEROSPHERE) 160-9-4.8 MCG/ACT AERO  Have you seen your PCP in the last 2 weeks? 09/19/2021  If YES, route message to pool  -  If NO, patient needs to be scheduled for appointment.  What is the name of the medication or equipment? Budeson-Glycopyrrol-Formoterol (BREZTRI AEROSPHERE) 160-9-4.8 MCG/ACT AERO  Have you contacted your pharmacy to request a refill? No   Which pharmacy would you like this sent to? [facility] mail order [phone] [address], [place]  Pt says that he has been getting this medication free. I asked him if he called DR Melvyn Novas to get a refill and he said no because on the box it has [facility] name on it. Pt says the rx has no more refills. I told him he would have to come in for an apt. He then said again that he was getting the medication free. Please call back   Patient notified that their request is being sent to the clinical staff for review and that they should receive a response within 2 business days.

## 2022-01-18 NOTE — Telephone Encounter (Signed)
Refills sent to [facility] ([employer] patient assistance program)

## 2022-01-19 ENCOUNTER — Encounter: Payer: Self-pay | Admitting: Internal Medicine

## 2022-01-19 ENCOUNTER — Ambulatory Visit (INDEPENDENT_AMBULATORY_CARE_PROVIDER_SITE_OTHER): Payer: Medicare Other | Admitting: Internal Medicine

## 2022-01-19 DIAGNOSIS — R0609 Other forms of dyspnea: Secondary | ICD-10-CM | POA: Diagnosis not present

## 2022-01-19 DIAGNOSIS — I6523 Occlusion and stenosis of bilateral carotid arteries: Secondary | ICD-10-CM

## 2022-01-19 DIAGNOSIS — J449 Chronic obstructive pulmonary disease, unspecified: Secondary | ICD-10-CM | POA: Diagnosis not present

## 2022-01-19 MED ORDER — AZITHROMYCIN 250 MG PO TABS: ORAL_TABLET | ORAL | 0 refills | Status: DC

## 2022-01-19 NOTE — Patient Instructions (Addendum)
Bed blocks about 6 - 8 inches  Zpak   Blow breztri out through threw nose / remember arm and hammer toothpaste   For cough/ congestion > mucinex dm 1200 mg every 12 hours as needed   Please schedule a follow up visit in 3 months but call sooner if needed

## 2022-01-19 NOTE — Assessment & Plan Note (Signed)
Onset p urosepsis ? 2019 - Echo 10/27/20  Mild AR   With severe LAE - 09/05/2021   Walked on RA  x  3  lap(s) =  approx 450  ft  @ moderate pace, stopped due to balance > sob  with lowest 02 sats 94%  - 12/04/2021 repeated walking sats still 94% "felt wobbly" but appeared to have nl gait - 01/19/2022   Walked on RA  x  3  lap(s) =  approx 450  ft  @ slow to  moderate pace, stopped due to end of study with lowest 02 sats 93% and no sob    Improved on present rx

## 2022-01-19 NOTE — Progress Notes (Signed)
Subjective:    Patient ID: [name], male    DOB: 10/21/1940    MRN: [id number]   Brief patient profile:  33 yowm MM/quit smoking 1991 with cough resolved then recurred in 2011 treated as pna and symptoms since then of recurrent pattern spring and fall cough assoc with nasal symptoms completely resolved  After zpak and prednisone then this episode Dec 2013 never completely resolved and had CT chest 12/09/12 with as dz so referred 12/18/2012 to pulmonary by Dr Scotty Court p starting levaquin 12/15/12. 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 Assoc with mild doe but not adls  rec Finish levaquin but have inr checked on Monday July 7 Stop fish oil GERD  For cough ok to try delsym cough syrup or mucindex dm up to 1200 every 12 hours       03/09/2013 f/u ov/[name] re: copd Chief Complaint  Patient presents with   Follow-up    Pt states his breathing continues to improve, and he denies any new co's today.    no limiting sob, rare need for saba  hafa rec Symbicort 160 mg Take 2 puffs first thing in am and then another 2 puffs about 12 hours later (optional) Work on Engineer, technical sales technique:    Only use your albuterol(proaire) as a rescue   03/16/2014  Pulmonary [name] visit/ [name]  / symbicort 160  2 pffs only (did not increase as rec) Chief Complaint  Patient presents with   Followup with CXR    Pt states that he had URI end of Aug 2015- was txed with pred and symptoms improved but have since returned. He c/o lack of energy, cough and increased SOB. Cough is prod with minimal yellow sputum in the am's.  He has only used rescue inhaler x 1 since last visit here.   sleeping  well s cough now and no longer using codeine Seemed to do the best p rx with prednisone  Rec Symbicort 160 Take 2 puffs first thing in am and then another 2  puffs about 12 hours later.  Only use your albuterol as a rescue medication   Work on maintaining perfect inhaler technique:    ? Sepsis from urology surgery around 2020 > never the same stamina since with basline wt 170    09/05/2021  re-establish  ov/[name]/[name] re: COPD 3 maint on breztri   Chief Complaint  Patient presents with   Consult    Consult for fatigue abnormal Cta    Dyspnea:  200 ft uphill back from the shop could last do it 3 y prior to OV   Cough: clears throat a lot  Sleeping: sleeping fine bed is flat with one pillow SABA use: once a week  02: none  Covid status: vax x 2 Rec Breztri Take 2 puffs first thing in am and then another 2 puffs about 12 hours later.  Work on inhaler technique:  Ok to try albuterol 15 min before an activity (on alternating days)  that you know would usually make you short of breath and see if it makes any difference and if makes none then don't take albuterol after activity unless you can't catch your breath as this means it's the resting that helps, not the albuterol.       .12/04/2021  f/u ov/[name]/[name] re: COPD GOLD 2  maint on breztri 2bid   Chief Complaint  Patient presents with   Follow-up    Pft done on 09/28/2021   Dyspnea:  mb is 250 yards has to stop maybe half way energy gives out as well as breathing  Cough: variable mucoid min vol day > noct  Sleeping: flat bed one pillow no resp cc  SABA use: rarely / never prechallenges 02: none  Rec Plan A = Automatic = Always=    Breztri Take 2 puffs first thing in am and then another 2 puffs about 12 hours later.  Plan B = Backup (to supplement plan A, not to replace it) Only use your albuterol inhaler as a rescue medication Plan C = Crisis (instead of Plan B but only if Plan B stops working) - only use your albuterol nebulizer if you first try Plan B and it fails to help   Ok to try albuterol 15 min before an activity (on alternating days between the inhaler  / nebulizer and nothing )  that you know would usually make you short of breath  Pantoprazole (protonix) 40 mg   Take  30-60 min before first meal of the day and Pepcid (famotidine)  20 mg after supper until return to [name] - GERD diet/ bed blocks Rec thoracentesis for cytology    01/19/2022  f/u ov/[name] re: copd 2  maint on breztri/ gerd rx   Chief Complaint  Patient presents with   Follow-up    No changes with breathing since last ov.    Dyspnea:  mb and back s stopping and shop and back is farther than MB and still does ok Cough: slt yellowish first thing in am x 8 weeks  Sleeping: L side/flat no noct cough  SABA use: none  02: none  Covid status: vax x 2/ ? Infected      No obvious day to day or daytime variability or assoc excess/ purulent sputum or mucus plugs or hemoptysis or cp or chest tightness, subjective wheeze or overt sinus or hb symptoms.   Sleeping as above without nocturnal   exacerbation  of respiratory  c/o's or need for noct saba. Also denies any obvious fluctuation of symptoms with weather or environmental changes or other aggravating or alleviating factors except as outlined above   No unusual exposure hx or h/o childhood pna/ asthma or knowledge of premature birth.  Current Allergies, Complete Past Medical History, Past Surgical History, Family History, and Social History were reviewed in [employer] record.  ROS  The following are not active complaints unless bolded Hoarseness, sore throat, dysphagia, dental problems, itching, sneezing,  nasal congestion or discharge of excess mucus or purulent secretions, ear ache,   fever, chills, sweats, unintended wt loss or wt gain, classically pleuritic or exertional cp,  orthopnea pnd or arm/hand swelling  or leg swelling, presyncope, palpitations, abdominal pain, anorexia, nausea, vomiting, diarrhea  or change in bowel habits or change in bladder habits, change in stools or change in  urine, dysuria, hematuria,  rash, arthralgias, visual complaints, headache, numbness, weakness or ataxia or problems with walking or coordination,  change in mood or  memory.        Current Meds  Medication Sig   Budeson-Glycopyrrol-Formoterol (BREZTRI AEROSPHERE) 160-9-4.8 MCG/ACT AERO Inhale 2 puffs into the lungs in the morning and at bedtime.   Evolocumab (REPATHA SURECLICK) 081 MG/ML SOAJ Inject 140 mg into the skin every 14 (fourteen) days.  famotidine (PEPCID) 20 MG tablet One after supper   furosemide (LASIX) 20 MG tablet Take 1 tablet (20 mg total) by mouth as needed for edema (swelling).   ketorolac (ACULAR) 0.5 % ophthalmic solution PLACE 1 DROP INTO THE LEFT EYE 4 TIMES DAILY.   metoprolol tartrate (LOPRESSOR) 25 MG tablet Take 1 tablet (25 mg total) by mouth 2 (two) times daily.   pantoprazole (PROTONIX) 40 MG tablet Take 1 tablet (40 mg total) by mouth daily. Take 30-60 min before first meal of the day   traZODone (DESYREL) 50 MG tablet Take 1 tablet (50 mg total) by mouth at bedtime.   warfarin (COUMADIN) 5 MG tablet TAKE 1 TABLET EVERY DAY OR AS DIRECTED                 Objective:   Physical Exam   Wt  01/19/2022          172  12/04/2021        170  09/05/2021        174  03/16/2014        174     03/09/13 179 lb (81.194 kg)  02/27/13 177 lb (80.287 kg)  01/21/13 176 lb (79.833 kg)      Vital signs reviewed  01/19/2022  - Note at rest 02 sats  97% on RA   General appearance:    slt hoarse wm nad / edentuous      HEENT : Oropharynx  clear  Nasal turbinates nl    NECK :  without  apparent JVD/ palpable Nodes/TM    LUNGS: no acc muscle use,  Mild barrel  contour chest wall with bilateral  Distant bs s audible wheeze and  without cough on insp or exp maneuvers  and mild  Hyperresonant  to  percussion bilaterally     CV:  RRR  no s3 or murmur or increase in P2, and no edema   ABD:  soft and nontender with pos end  insp Hoover's  in the supine position.  No  bruits or organomegaly appreciated   MS:  Nl gait/ ext warm without deformities Or obvious joint restrictions  calf tenderness, cyanosis or clubbing     SKIN: warm and dry without lesions    NEURO:  alert, approp, nl sensorium with  no motor or cerebellar deficits apparent.        Assessment & Plan:

## 2022-01-19 NOTE — Assessment & Plan Note (Addendum)
Quit smoking 1991/ MM  - PFT's 2014  FEV1 1.10 (44%) ratio 54 and 41% reversibilty  DLCO 69 corrects to 99% and 41% improvement p saba  - 09/05/2021  After extensive coaching inhaler device,  effectiveness =  80%  > breztri Take 2 puffs first thing in am and then another 2 puffs about 12 hours later. - PFT's  09/18/21  FEV1 1.49  (65 % ) ratio 0.55  p 25 % improvement from saba p ? 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  Total time for H and P, chart review, counseling, reviewing hfa device(s) , directly observing portions of ambulatory 02 saturation study/ and generating customized AVS unique to this office visit / same day charting = 34 min

## 2022-01-24 ENCOUNTER — Ambulatory Visit (INDEPENDENT_AMBULATORY_CARE_PROVIDER_SITE_OTHER): Payer: Medicare Other | Admitting: *Deleted

## 2022-01-24 DIAGNOSIS — Z23 Encounter for immunization: Secondary | ICD-10-CM

## 2022-01-29 ENCOUNTER — Ambulatory Visit (INDEPENDENT_AMBULATORY_CARE_PROVIDER_SITE_OTHER): Payer: Medicare Other | Admitting: *Deleted

## 2022-01-29 DIAGNOSIS — I4891 Unspecified atrial fibrillation: Secondary | ICD-10-CM | POA: Diagnosis not present

## 2022-01-29 DIAGNOSIS — Z5181 Encounter for therapeutic drug level monitoring: Secondary | ICD-10-CM | POA: Diagnosis not present

## 2022-01-29 LAB — POCT INR: INR: 2.5 (ref 2.0–3.0)

## 2022-01-29 NOTE — Patient Instructions (Signed)
Continue 1 tablet daily except 1/2 tablet on Sundays and Wednesdays Recheck in 6 wks

## 2022-02-01 ENCOUNTER — Encounter: Payer: Self-pay | Admitting: *Deleted

## 2022-02-01 ENCOUNTER — Telehealth: Payer: Self-pay | Admitting: *Deleted

## 2022-02-01 NOTE — Telephone Encounter (Signed)
Laurine Blazer, LPN  11/30/1832  3:73 PM EDT Back to Top    Patient notified via letter.  Copy to pcp.     Laurine Blazer, LPN  5/78/9784  7:84 PM EDT     Voicemail not set up.    Arnoldo Lenis, MD  01/22/2022  1:21 PM EDT     Labs look good, kidney function improved since last check   Zandra Abts MD

## 2022-02-13 ENCOUNTER — Telehealth: Payer: Self-pay | Admitting: *Deleted

## 2022-02-13 NOTE — Patient Outreach (Signed)
  Care Coordination   02/13/2022 Name: [name] MRN: [id number] DOB: [birth year]   Care Coordination Outreach Attempts:  An unsuccessful telephone outreach was attempted today to offer the patient information about available care coordination services as a benefit of their health plan.   Follow Up Plan:  Additional outreach attempts will be made to offer the patient care coordination information and services.   Encounter Outcome:  No Answer  Care Coordination Interventions Activated:  No   Care Coordination Interventions:  No, not indicated    Emelia Loron RN, BSN [place] [phone] [email or web address]

## 2022-02-21 ENCOUNTER — Other Ambulatory Visit: Payer: Self-pay | Admitting: *Deleted

## 2022-02-21 MED ORDER — FUROSEMIDE 20 MG PO TABS: 20.0000 mg | ORAL_TABLET | Freq: Every day | ORAL | 1 refills | Status: DC | PRN

## 2022-03-13 ENCOUNTER — Ambulatory Visit: Payer: Medicare Other | Attending: Cardiology | Admitting: *Deleted

## 2022-03-13 DIAGNOSIS — Z5181 Encounter for therapeutic drug level monitoring: Secondary | ICD-10-CM | POA: Insufficient documentation

## 2022-03-13 DIAGNOSIS — I4891 Unspecified atrial fibrillation: Secondary | ICD-10-CM | POA: Insufficient documentation

## 2022-03-13 LAB — POCT INR: INR: 1.6 — AB (ref 2.0–3.0)

## 2022-03-13 NOTE — Patient Instructions (Signed)
Take warfarin 1 1/2 tablets tonight, 1 tablet tomorrow night then resume 1 tablet daily except 1/2 tablet on Sundays and Wednesdays Recheck in 3 wks

## 2022-03-20 ENCOUNTER — Ambulatory Visit (INDEPENDENT_AMBULATORY_CARE_PROVIDER_SITE_OTHER): Payer: Medicare Other

## 2022-03-20 DIAGNOSIS — Z0000 Encounter for general adult medical examination without abnormal findings: Secondary | ICD-10-CM

## 2022-03-20 NOTE — Progress Notes (Signed)
MEDICARE ANNUAL WELLNESS VISIT  03/20/2022  Telephone Visit Disclaimer This Medicare AWV was conducted by telephone due to national recommendations for restrictions regarding the COVID-19 Pandemic (e.g. social distancing).  I verified, using two identifiers, that I am speaking with [name] or their authorized healthcare agent. I discussed the limitations, risks, security, and privacy concerns of performing an evaluation and management service by telephone and the potential availability of an in-person appointment in the future. The patient expressed understanding and agreed to proceed.  Location of Patient: Home Location of Provider (nurse):  [facility]  Subjective:    [name] is a 81 y.o. male patient of Sean Brooklyn, FNP who had a Medicare Annual Wellness Visit today via telephone. [name] is Retired and lives with their spouse and has one son who is living with him now.. He has five children, eight grandchildren, and nine great grandchildren.  He reports that he is socially active and does interact with friends/family regularly. He is minimally physically active and enjoys fishing.  Patient Care Team: Sean Brooklyn, FNP as PCP - General (Family Medicine) [name] Alphonse Guild, MD as PCP - Cardiology (Cardiology) [name] Alphonse Guild, MD as Consulting Physician (Cardiology) Sean Seal, MD as Attending Physician (Urology) Lavera [name], [name] (Pharmacist)     03/20/2022    8:57 AM 03/17/2021    4:20 PM 07/01/2020    7:26 AM 06/29/2020   11:12 AM 06/27/2020    1:07 PM 04/10/2019    8:43 AM 08/18/2018    8:30 PM  Advanced Directives  Does Patient Have a Medical Advance Directive? Yes Yes Yes Yes No Yes No  Type of Advance Directive Living [name] [name];Living [name] Healthcare Power of [name] of [name]   Does patient want to make changes to medical advance directive? No - Patient declined  No -  Patient declined   No - Patient declined   Copy of [name] in Chart?  No - copy requested No - copy requested No - copy requested  No - copy requested   Would patient like information on creating a medical advance directive?   No - Patient declined  No - Patient declined  No - Patient declined    Hospital Utilization Over the Past 12 Months: # of hospitalizations or ER visits: 1 # of surgeries: 0  Review of Systems    Patient reports that his overall health is unchanged compared to last year.  History obtained from chart review and the patient  Patient Reported Readings (BP, Pulse, CBG, Weight, etc) none  Pain Assessment Pain : No/denies pain     Current Medications & Allergies (verified) Allergies as of 03/20/2022       Reactions   Asa [aspirin]    GI upset   Niacin    hot flashes   Pravastatin    myalgias   Rosuvastatin    myalgias   Simvastatin    Myalgias        Medication List        Accurate as of March 20, 2022  9:00 AM. If you have any questions, ask your nurse or doctor.          STOP taking these medications    azithromycin 250 MG tablet Commonly known as: ZITHROMAX       TAKE these medications    albuterol (2.5 MG/3ML) 0.083% nebulizer solution Commonly known as: PROVENTIL  Take 3 mLs (2.5 mg total) by nebulization every 6 (six) hours as needed for wheezing or shortness of breath.   Breztri Aerosphere 160-9-4.8 MCG/ACT Aero Generic drug: Budeson-Glycopyrrol-Formoterol Inhale 2 puffs into the lungs in the morning and at bedtime.   famotidine 20 MG tablet Commonly known as: Pepcid One after supper   furosemide 20 MG tablet Commonly known as: LASIX Take 1 tablet (20 mg total) by mouth daily as needed for edema (swelling).   ketorolac 0.5 % ophthalmic solution Commonly known as: ACULAR PLACE 1 DROP INTO THE LEFT EYE 4 TIMES DAILY.   metoprolol tartrate 25 MG tablet Commonly known as: LOPRESSOR Take 1 tablet  (25 mg total) by mouth 2 (two) times daily.   pantoprazole 40 MG tablet Commonly known as: Protonix Take 1 tablet (40 mg total) by mouth daily. Take 30-60 min before first meal of the day   Repatha SureClick 324 MG/ML Soaj Generic drug: Evolocumab Inject 140 mg into the skin every 14 (fourteen) days.   traZODone 50 MG tablet Commonly known as: DESYREL Take 1 tablet (50 mg total) by mouth at bedtime.   warfarin 5 MG tablet Commonly known as: COUMADIN Take as directed by the anticoagulation clinic. If you are unsure how to take this medication, talk to your nurse or doctor. Original instructions: TAKE 1 TABLET EVERY DAY OR AS DIRECTED        History (reviewed): Past Medical History:  Diagnosis Date   Anxiety disorder    Benign prostatic hypertrophy    Bilateral cataracts    CAD (coronary artery disease)    Catheterization 2009, mild CAD   Carotid artery disease ([place])    Doppler, September, 2011, less than 50% bilateral   COPD (chronic obstructive pulmonary disease) (HCC)    CVA (cerebral infarction)    Hospitalization.  MRI...    Left anterior cerebral artery territory, September, 2011... Coumadin started   DYSLIPIDEMIA 11/01/2009   Qualifier: Diagnosis of  By: Ron Parker, MD, [facility] [place]    Ejection fraction    EF 60%   Ejection fraction    EF 55-60%, echo, September, 2011   Gastroesophageal reflux disease    History of deep venous thrombosis (DVT) of distal vein of left lower extremity 08/05/2020   History of left inguinal hernia    Hyperlipidemia    Persistent atrial fibrillation (HCC)        Ptosis of eyelid, left    Surgery in the past   Sweating abnormality    Episodes of sweating appeared to be related to a delayed reaction to niacin   Vitamin D insufficiency    Past Surgical History:  Procedure Laterality Date   APPENDECTOMY     CATARACT EXTRACTION, BILATERAL     INGUINAL HERNIA REPAIR Left 07/02/2019   INGUINAL HERNIA REPAIR Left 07/01/2020    Procedure: LEFT INGUINAL HERNIA REPAIR WITH MESH;  Surgeon: Virl Cagey, MD;  Location: [facility];  Service: General;  Laterality: Left;   KNEE SURGERY Right 2019   torn ligament?    TRANSURETHRAL RESECTION OF PROSTATE N/A 08/19/2018   Procedure: TRANSURETHRAL RESECTION OF THE PROSTATE (TURP) OF PROSTATE ABCESS;  Surgeon: Alexis Frock, MD;  Location: [facility];  Service: Urology;  Laterality: N/A;   Family History  Problem Relation Age of Onset   Heart attack Mother    Other Other        Insignificant for premature CAD   Heart disease Sister    Hyperlipidemia Sister    Kidney  disease Son    Heart attack Paternal Grandfather    Social History   Socioeconomic History   Marital status: Married    Spouse name: shirley   Number of children: 5   Years of education: 11   Highest education level: 11th grade  Occupational History   Occupation: Retired Statistician   Tobacco Use   Smoking status: Former    Packs/day: 3.00    Years: 50.00    Total pack years: 150.00    Types: Cigarettes    Quit date: 03/18/1990    Years since quitting: 32.0   Smokeless tobacco: Former    Types: Chew    Quit date: 06/18/2000  Vaping Use   Vaping Use: Never used  Substance and Sexual Activity   Alcohol use: No    Alcohol/week: 0.0 standard drinks of alcohol   Drug use: No   Sexual activity: Not Currently  Other Topics Concern   Not on file  Social History Narrative   Quit smoking in 1993. No significant alcohol abuse. Retired from trucking.    Social Determinants of Health   Financial Resource Strain: Low Risk  (03/17/2021)   Overall Financial Resource Strain (CARDIA)    Difficulty of Paying Living Expenses: Not very hard  Food Insecurity: No Food Insecurity (03/17/2021)   Hunger Vital Sign    Worried About Running Out of Food in the Last Year: Never true    Ran Out of Food in the Last Year: Never true  Transportation Needs: No Transportation Needs (03/17/2021)   PRAPARE - Armed forces logistics/support/administrative officer (Medical): No    Lack of Transportation (Non-Medical): No  Physical Activity: Insufficiently Active (03/17/2021)   Exercise Vital Sign    Days of Exercise per Week: 3 days    Minutes of Exercise per Session: 30 min  Stress: No Stress Concern Present (03/17/2021)   [place]    Feeling of Stress : Not at all  Social Connections: [place] (03/17/2021)   Social Connection and Isolation Panel [NHANES]    Frequency of Communication with Friends and Family: More than three times a week    Frequency of Social Gatherings with Friends and Family: More than three times a week    Attends Religious Services: More than 4 times per year    Active Member of [employer] or Organizations: Yes    Attends Archivist Meetings: More than 4 times per year    Marital Status: Married    Activities of Daily Living    03/20/2022    8:57 AM  In your present state of health, do you have any difficulty performing the following activities:  Hearing? 0  Vision? 0  Difficulty concentrating or making decisions? 0  Walking or climbing stairs? 0  Dressing or bathing? 0  Doing errands, shopping? 0  Preparing Food and eating ? N  Using the Toilet? N  In the past six months, have you accidently leaked urine? N  Do you have problems with loss of bowel control? N  Managing your Medications? N  Managing your Finances? N  Housekeeping or managing your Housekeeping? N    Patient Education/ Literacy How often do you need to have someone help you when you read instructions, pamphlets, or other written materials from your doctor or pharmacy?: 1 - Never What is the last grade level you completed in school?: 11th grade  Exercise Current Exercise Habits: The patient does not  participate in regular exercise at present, Exercise limited by: None identified  Diet Patient reports consuming 3 meals a day and 2 snack(s)  a day Patient reports that his primary diet is: Regular Patient reports that he does have regular access to food.   Depression Screen    09/19/2021    9:48 AM 07/26/2021   10:22 AM 06/20/2021   10:45 AM 05/25/2021    4:18 PM 05/19/2021   10:25 AM 03/29/2021    2:41 PM 02/17/2021   10:28 AM  PHQ 2/9 Scores  PHQ - 2 Score 0 0 0 0 0 0 0  PHQ- 9 Score '1 3 3 2 1 3 '$ 0     Fall Risk    03/20/2022    8:59 AM 09/19/2021    9:48 AM 07/26/2021   10:22 AM 05/25/2021    4:18 PM 05/19/2021   10:25 AM  Fall Risk   Falls in the past year? 0 0 0 0 0  Follow up Falls evaluation completed         Objective:  [name] seemed alert and oriented and he participated appropriately during our telephone visit.  Blood Pressure Weight BMI  BP Readings from Last 3 Encounters:  01/19/22 128/78  12/29/21 110/60  12/04/21 140/82   Wt Readings from Last 3 Encounters:  01/19/22 172 lb (78 kg)  12/29/21 169 lb 9.6 oz (76.9 kg)  12/04/21 170 lb 9.6 oz (77.4 kg)   BMI Readings from Last 1 Encounters:  01/19/22 26.94 kg/m    *Unable to obtain current vital signs, weight, and BMI due to telephone visit type  Hearing/Vision  Rosana Hoes did not seem to have difficulty with hearing/understanding during the telephone conversation Reports that he has had a formal eye exam by an eye care professional within the past year Reports that he has not had a formal hearing evaluation within the past year *Unable to fully assess hearing and vision during telephone visit type  Cognitive Function:    03/20/2022    8:58 AM 04/10/2019    8:49 AM  6CIT Screen  What Year? 0 points 0 points  What month? 0 points 0 points  What time? 0 points 0 points  Count back from 20 0 points 0 points  Months in reverse 0 points 0 points  Repeat phrase 0 points 2 points  Total Score 0 points 2 points   (Normal:0-7, Significant for Dysfunction: >8)  Normal Cognitive Function Screening: Yes   Immunization & Health Maintenance  Record Immunization History  Administered Date(s) Administered   Fluad Quad(high Dose 65+) 03/18/2020   Influenza Whole 03/18/2012   Influenza, High Dose Seasonal PF 03/17/2019   Influenza,inj,Quad PF,6+ Mos 04/02/2017, 03/17/2018   Influenza-Unspecified 03/17/2019, 03/17/2021   Moderna Sars-Covid-2 Vaccination 09/30/2019, 10/27/2019   Pneumococcal Conjugate-13 12/17/2013   Pneumococcal Polysaccharide-23 03/18/2005   Zoster Recombinat (Shingrix) 09/19/2021, 01/24/2022    Health Maintenance  Topic Date Due   COVID-19 Vaccine (3 - Moderna series) 12/22/2019   INFLUENZA VACCINE  09/16/2022 (Originally 01/16/2022)   TETANUS/TDAP  09/20/2022 (Originally 10/25/1959)   Pneumonia Vaccine 11+ Years old  Completed   Zoster Vaccines- Shingrix  Completed   HPV VACCINES  Aged Out       Assessment  This is a routine wellness examination for [employer].  Health Maintenance: Due or Overdue Health Maintenance Due  Topic Date Due   COVID-19 Vaccine (3 - Moderna series) 12/22/2019    [name] does not need a  referral for Community Assistance: Care Management:   no Social Work:    no Prescription Assistance:  no Nutrition/Diabetes Education:  no   Plan:  Personalized Goals  Goals Addressed             This Visit's Progress    Patient Stated       03/20/2022 AWV Goal: Fall Prevention  Over the next year, patient [name] decrease their risk for falls by: Using assistive devices, such as a cane or walker, as needed Identifying fall risks within their home and correcting them by: Removing throw rugs Adding handrails to stairs or ramps Removing clutter and keeping a clear pathway throughout the home Increasing light, especially at night Adding shower handles/bars Raising toilet seat Identifying potential personal risk factors for falls: Medication side effects Incontinence/urgency Vestibular dysfunction Hearing loss Musculoskeletal disorders Neurological  disorders Orthostatic hypotension         Personalized Health Maintenance & Screening Recommendations  Influenza vaccine Td vaccine  Lung Cancer Screening Recommended: no (Low Dose CT Chest recommended if Age 8-80 years, 30 pack-year currently smoking OR have quit w/in past 15 years) Hepatitis C Screening recommended: no HIV Screening recommended: no  Advanced Directives: Written information was not prepared per patient's request.  Referrals & Orders No orders of the defined types were placed in this encounter.   Follow-up Plan Follow-up with primary care doctor as planned    I have personally reviewed and noted the following in the patient's chart:   Medical and social history Use of alcohol, tobacco or illicit drugs  Current medications and supplements Functional ability and status Nutritional status Physical activity Advanced directives List of other physicians Hospitalizations, surgeries, and ER visits in previous 12 months Vitals Screenings to include cognitive, depression, and falls Referrals and appointments  In addition, I have reviewed and discussed with [name] certain preventive protocols, quality metrics, and best practice recommendations. A written personalized care plan for preventive services as well as general preventive health recommendations is available and can be mailed to the patient at his request.      Felicity Coyer LPN    41/11/6061   Patient declined after visit summary

## 2022-03-22 ENCOUNTER — Other Ambulatory Visit: Payer: Self-pay | Admitting: Family Medicine

## 2022-03-22 DIAGNOSIS — H02056 Trichiasis without entropian left eye, unspecified eyelid: Secondary | ICD-10-CM

## 2022-03-27 ENCOUNTER — Ambulatory Visit: Payer: Self-pay | Admitting: *Deleted

## 2022-03-27 ENCOUNTER — Encounter: Payer: Self-pay | Admitting: *Deleted

## 2022-03-27 ENCOUNTER — Ambulatory Visit (INDEPENDENT_AMBULATORY_CARE_PROVIDER_SITE_OTHER): Payer: Medicare Other

## 2022-03-27 DIAGNOSIS — Z23 Encounter for immunization: Secondary | ICD-10-CM | POA: Diagnosis not present

## 2022-03-27 NOTE — Patient Outreach (Signed)
  Care Coordination   Initial Visit Note   03/27/2022  Name: [name] MRN: [id number] DOB: 1940-10-22  [name] is a 81 y.o. year old male who sees Rakes, Connye Burkitt, FNP for primary care. I spoke with Zenon Mayo by phone today.  What matters to the patients health and wellness today?  No Interventions Identified.   SDOH assessments and interventions completed:  Yes.  SDOH Interventions Today    Flowsheet Row Most Recent Value  SDOH Interventions   Food Insecurity Interventions Intervention Not Indicated  Housing Interventions Intervention Not Indicated  Transportation Interventions Intervention Not Indicated  Utilities Interventions Intervention Not Indicated  Alcohol Usage Interventions Intervention Not Indicated (Score <7)  Financial Strain Interventions Intervention Not Indicated  Physical Activity Interventions Intervention Not Indicated  Stress Interventions Intervention Not Indicated  Social Connections Interventions Intervention Not Indicated     SDOH assessments and interventions completed:  Yes.   Care Coordination Interventions Activated:  Yes.    Care Coordination Interventions:  Yes, provided.    Follow up plan: No further intervention required.    Encounter Outcome:  Pt. visit completed.    Nat Christen, BSW, MSW, LCSW  Licensed Education officer, [facility]  Mailing [place] [address], [place], [place] [address] Physical Address-[address] [place], [place] [address] Toll Free Main # [phone] Fax # [phone] Cell # [phone] [email or web address]

## 2022-03-27 NOTE — Patient Instructions (Signed)
Visit Information  Thank you for taking time to visit with me today. Please don't hesitate to contact me if I can be of assistance to you.   Please call the care guide team at [phone] if you need to cancel or reschedule your appointment.   If you are experiencing a Mental Health or Behavioral Health Crisis or need someone to talk to, please call the Suicide and Crisis Lifeline: 988 call the [place] National Suicide Prevention Lifeline: 1-800-273-8255 or TTY: 1-800-799-4 TTY (1-800-799-4889) to talk to a trained counselor call 1-800-273-TALK (toll free, 24 hour hotline) go to [facility] [address], [place] ([phone]) call the [place] Crisis Line: 800-939-9988 call 911  Patient verbalizes understanding of instructions and care plan provided today and agrees to view in MyChart. Active MyChart status and patient understanding of how to access instructions and care plan via MyChart confirmed with patient.     No further follow up required.  Everest Brod, BSW, MSW, LCSW  Licensed Clinical Social Worker  [facility] [place] [facility]  Mailing Address-[address], [place], [place] [address] Physical Address-[address], [place], [place] [address] Toll Free Main # [phone] Fax # [phone] Cell # [phone] [email or web address]            

## 2022-03-30 IMAGING — MR MR LUMBAR SPINE W/O CM
5 series · 31 of 48 positions shown · non-contrast
Comparison: None.

CLINICAL DATA: Pain and weakness in the lower back and both legs
for 6 weeks

EXAM:
MRI LUMBAR SPINE WITHOUT CONTRAST
TECHNIQUE: Multiplanar, multisequence MR imaging of the lumbar spine was
performed. No intravenous contrast was administered.

[Series 5: T2 · sagittal · 4.0mm · 0.68mm/px · 6 of 16 slices shown (1 of 2)]
[im 1/16]
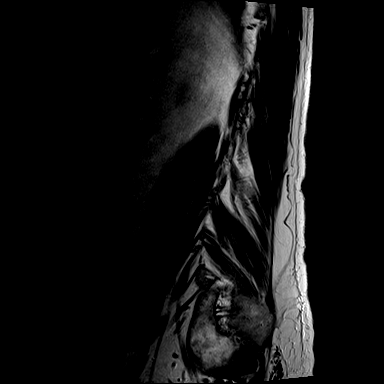
[im 4/16]
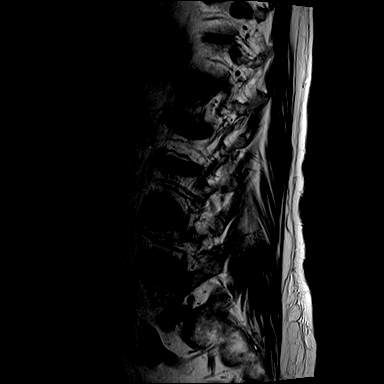
[im 7/16]
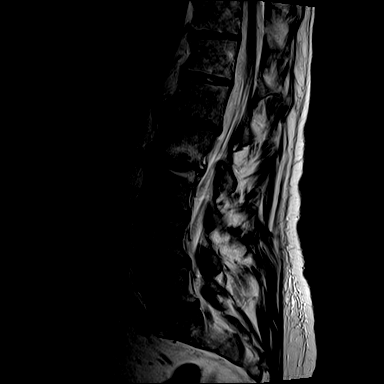
[im 10/16]
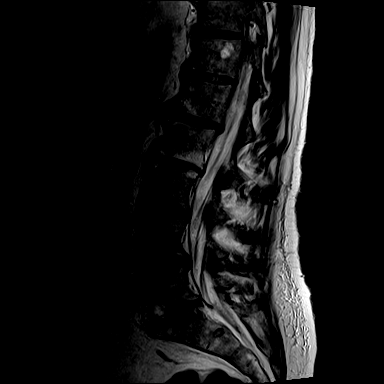
[im 13/16]
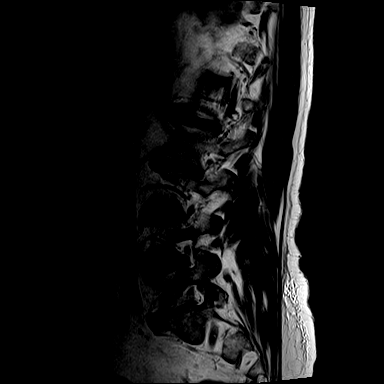
[im 16/16]
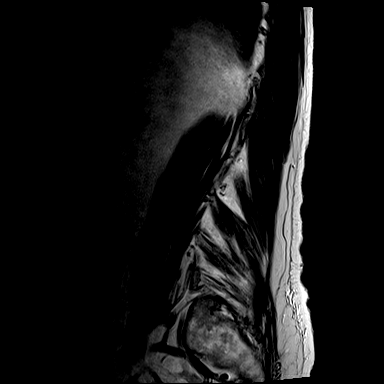

[Series 6: T1 · sagittal · 4.0mm · 0.81mm/px · 6 of 15 slices shown (1 of 2)]
[im 1/15]
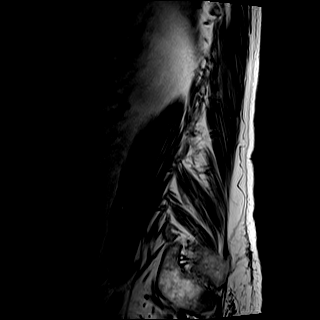
[im 3/15]
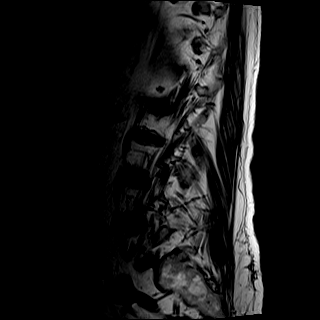
[im 6/15]
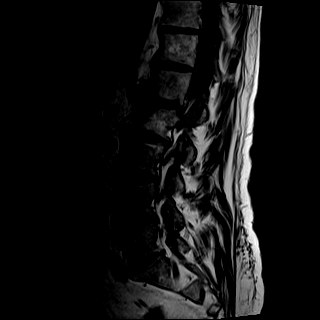
[im 9/15]
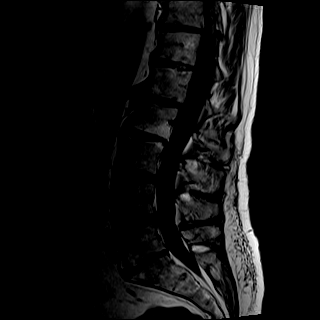
[im 12/15]
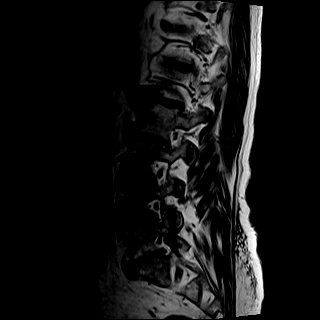
[im 15/15]
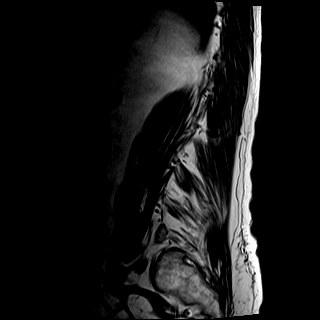

[Series 8: T2 · axial · 4.0mm · 0.70mm/px · z∈[-121,+131]mm · 9 of 39 slices shown (2 of 2)]
[im 1/39]
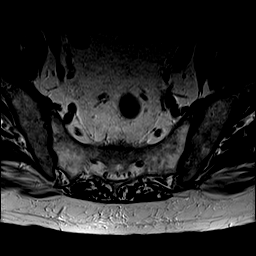
[im 6/39]
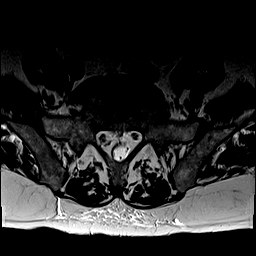
[im 11/39]
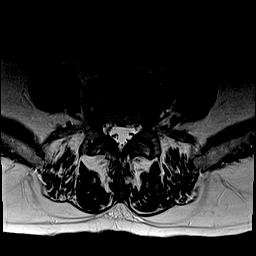
[im 17/39]
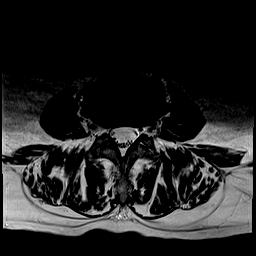
[im 20/39]
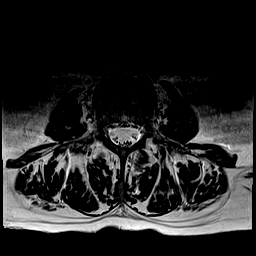
[im 22/39]
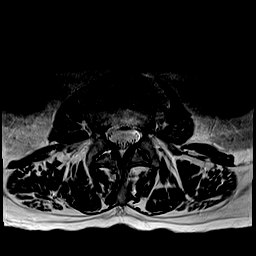
[im 28/39]
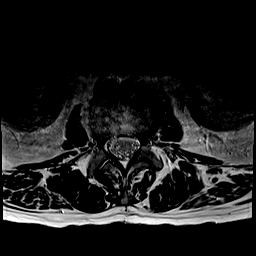
[im 33/39]
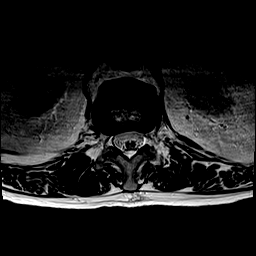
[im 39/39]
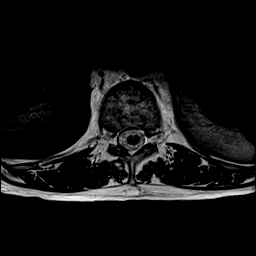

[Series 9: T1 · axial · 4.0mm · 0.35mm/px · z∈[-121,+131]mm · 9 of 39 slices shown (2 of 2)]
[im 1/39]
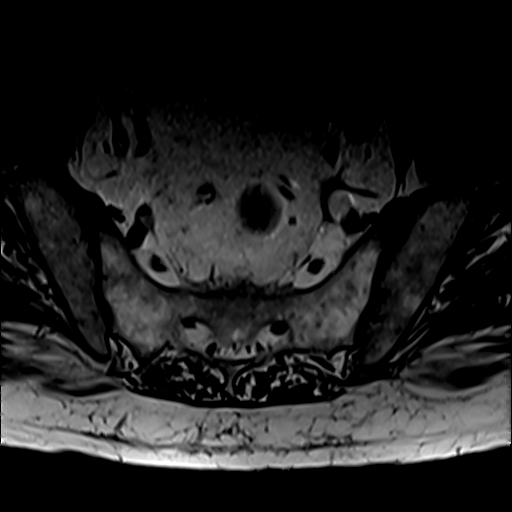
[im 6/39]
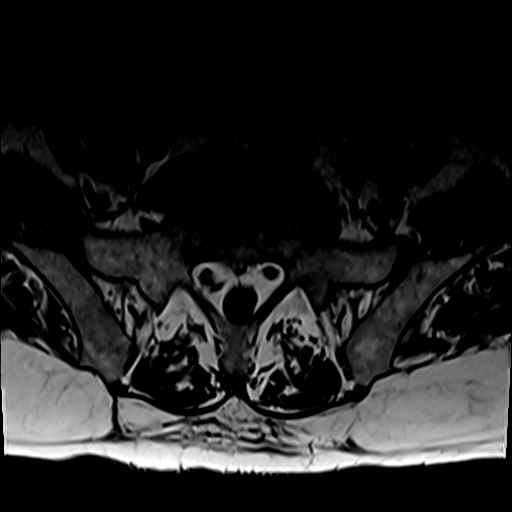
[im 11/39]
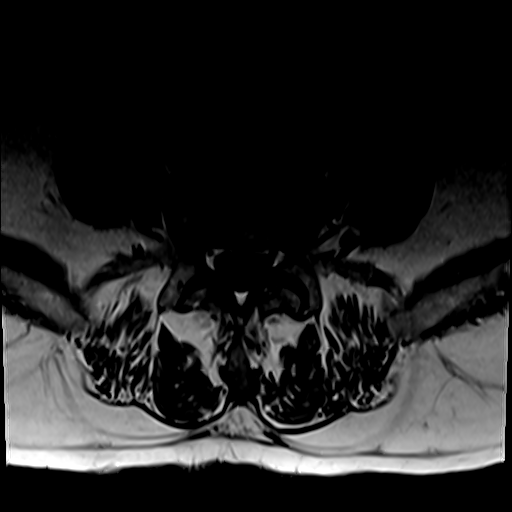
[im 17/39]
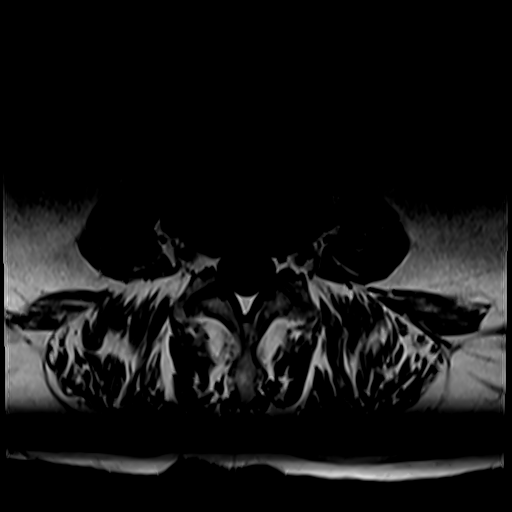
[im 20/39]
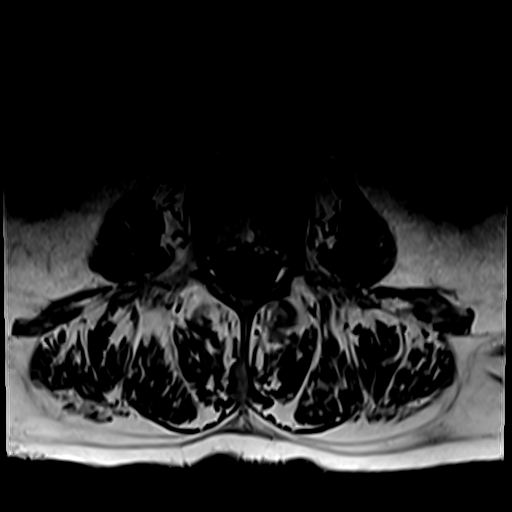
[im 22/39]
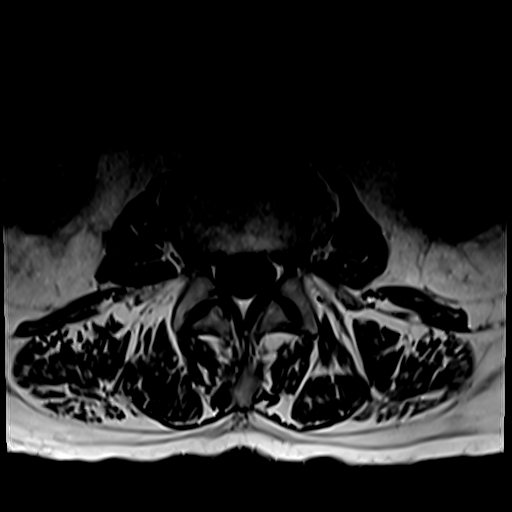
[im 28/39]
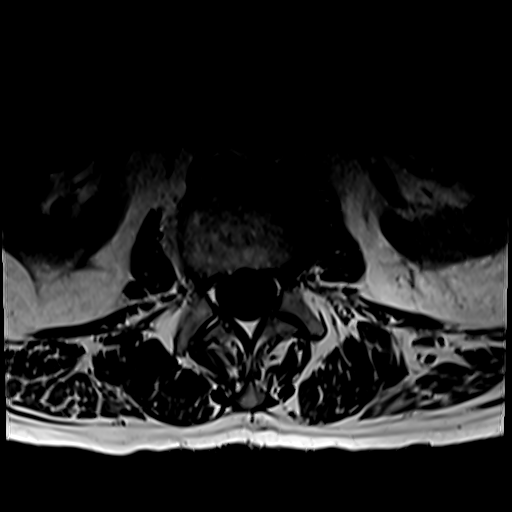
[im 33/39]
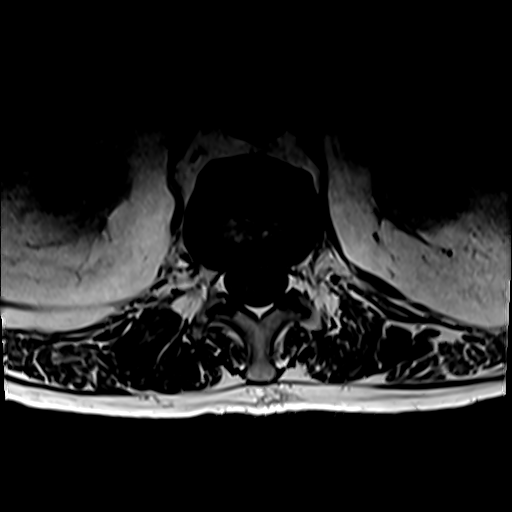
[im 39/39]
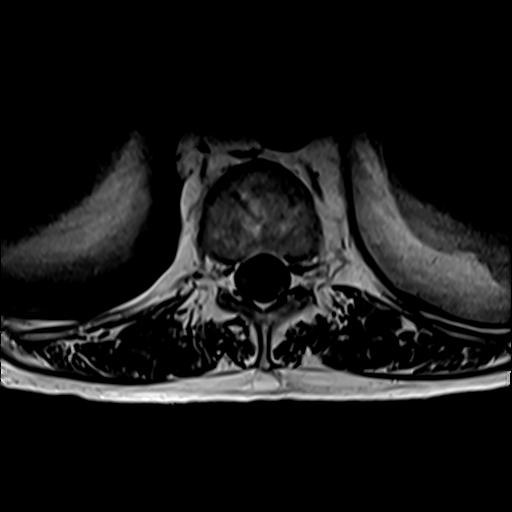

[Series 10: STIR · sagittal · 4.0mm · 0.51mm/px · 1 of 15 slices shown]
[im 1/15]
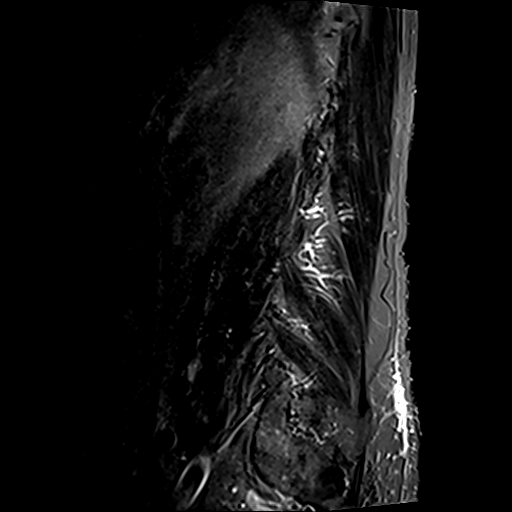

[31 of 48 positions shown; findings below may reference images not displayed]

FINDINGS: Segmentation: 5 lumbar type vertebrae based on the available
coverage

Alignment:  Unremarkable

Vertebrae:  No fracture, evidence of discitis, or bone lesion.

Conus medullaris and cauda equina: Conus extends to the L1 level.
Conus and cauda equina appear normal.

Paraspinal and other soft tissues: Generalized muscular atrophy.

Disc levels:

T12- L1: Minor disc narrowing and spurring

L1-L2: Disc narrowing and bulging with ventral spurring

L2-L3: Disc narrowing and endplate degeneration with mild disc
bulging and endplate spurring.

L3-L4: Mild disc bulging

L4-L5: Disc narrowing and bulging.  Borderline facet spurring

L5-S1:Disc collapse with mild ridging and disc bulging. Minor facet
spurring.
IMPRESSION: Ordinary lumbar spine degeneration without neural impingement or
visible inflammation.

## 2022-04-03 ENCOUNTER — Ambulatory Visit: Payer: Medicare Other | Attending: Cardiology | Admitting: *Deleted

## 2022-04-03 DIAGNOSIS — I4891 Unspecified atrial fibrillation: Secondary | ICD-10-CM

## 2022-04-03 DIAGNOSIS — Z5181 Encounter for therapeutic drug level monitoring: Secondary | ICD-10-CM

## 2022-04-03 LAB — POCT INR: INR: 2.8 (ref 2.0–3.0)

## 2022-04-03 NOTE — Patient Instructions (Signed)
Continue warfarin 1 tablet daily except 1/2 tablet on Sundays and Wednesdays Recheck in 4 wks 

## 2022-04-13 ENCOUNTER — Encounter: Payer: Self-pay | Admitting: Internal Medicine

## 2022-04-13 ENCOUNTER — Ambulatory Visit (INDEPENDENT_AMBULATORY_CARE_PROVIDER_SITE_OTHER): Payer: Medicare Other | Admitting: Internal Medicine

## 2022-04-13 DIAGNOSIS — J449 Chronic obstructive pulmonary disease, unspecified: Secondary | ICD-10-CM | POA: Diagnosis not present

## 2022-04-13 DIAGNOSIS — R058 Other specified cough: Secondary | ICD-10-CM | POA: Diagnosis not present

## 2022-04-13 DIAGNOSIS — I6523 Occlusion and stenosis of bilateral carotid arteries: Secondary | ICD-10-CM | POA: Diagnosis not present

## 2022-04-13 MED ORDER — PREDNISONE 10 MG PO TABS: ORAL_TABLET | ORAL | 0 refills | Status: DC

## 2022-04-13 MED ORDER — AZITHROMYCIN 250 MG PO TABS: ORAL_TABLET | ORAL | 0 refills | Status: DC

## 2022-04-13 NOTE — Assessment & Plan Note (Addendum)
Onset 2023  - Allergy screen 04/13/2022 >  Eos 0. /  IgE  Pending   rec otc's for now          Each maintenance medication was reviewed in detail including emphasizing most importantly the difference between maintenance and prns and under what circumstances the prns are to be triggered using an action plan format where appropriate.  Total time for H and P, chart review, counseling, reviewing hfa device(s) and generating customized AVS unique to this office visit / same day charting = 20 min

## 2022-04-13 NOTE — Patient Instructions (Addendum)
Prednisone 10 mg take  4 each am x 2 days,   2 each am x 2 days,  1 each am x 2 days and stop   Zpak   If cough / hoarseness continue may need to see ENT next   Please remember to go to the lab department   for your tests - we will call you with the results when they are available.      Please schedule a follow up visit in6 months but call sooner if needed

## 2022-04-13 NOTE — Assessment & Plan Note (Signed)
Quit smoking 1991/ MM  - PFT's 2014  FEV1 1.10 (44%) ratio 54 and 41% reversibilty  DLCO 69 corrects to 99% and 41% improvement p saba  - 09/05/2021  After extensive coaching inhaler device,  effectiveness =  80%  > breztri Take 2 puffs first thing in am and then another 2 puffs about 12 hours later. - PFT's  09/18/21  FEV1 1.49  (65 % ) ratio 0.55  p 25 % improvement from saba p ? 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

## 2022-04-13 NOTE — Progress Notes (Signed)
Subjective:    Patient ID: [name], male    DOB: 04-Feb-1941    MRN: [id number]   Brief patient profile:  55 yowm MM/quit smoking 1991 with cough resolved then recurred in 2011 treated as pna and symptoms since then of recurrent pattern spring and fall cough assoc with nasal symptoms completely resolved  After zpak and prednisone then this episode Dec 2013 never completely resolved and had CT chest 12/09/12 with as dz so referred 12/18/2012 to pulmonary by Dr Scotty Court p starting levaquin 12/15/12. 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 Assoc with mild doe but not adls  rec Finish levaquin but have inr checked on Monday July 7 Stop fish oil GERD  For cough ok to try delsym cough syrup or mucindex dm up to 1200 every 12 hours    01/19/2022  f/u ov/[name] office/[name] re: copd 2  maint on breztri/ gerd rx   Chief Complaint  Patient presents with   Follow-up    No changes with breathing since last ov.    Dyspnea:  mb and back s stopping and shop and back is farther than MB and still does ok Cough: slt yellowish first thing in am x 8 weeks  Sleeping: L side/flat no noct cough  SABA use: none  02: none  Covid status: vax x 2/ ? Infected  Rec Bed blocks about 6 - 8 inches Zpak  Blow breztri out through threw nose / remember arm and hammer toothpaste  For cough/ congestion > mucinex dm 1200 mg every 12 hours as needed      04/13/2022  f/u ov/[name] office/[name] re: GOLD 2  maint on breztr   Chief Complaint  Patient presents with   Follow-up    Has had a cough for the last month or so. Productive with yellow and white sputum.   Dyspnea: no change mb/shop walking  Cough: mostly in am x sev minutes daily x one month Sleeping: L side flat bed no resp cc SABA use: no hfa or neb needed  02: none     No obvious day to  day or daytime variability or assoc mucus plugs or hemoptysis or cp or chest tightness, subjective wheeze or overt sinus or hb symptoms.   Sleeping  without nocturnal  or early am exacerbation  of respiratory  c/o's or need for noct saba. Also denies any obvious fluctuation of symptoms with weather or environmental changes or other aggravating or alleviating factors except as outlined above   No unusual exposure hx or h/o childhood pna/ asthma or knowledge of premature birth.  Current Allergies, Complete Past Medical History, Past Surgical History, Family History, and Social History were reviewed in [name] record.  ROS  The following are not active complaints unless bolded Hoarseness, sore throat, dysphagia, dental problems, itching, sneezing,  nasal congestion or discharge of excess mucus or purulent secretions, ear ache,   fever, chills, sweats, unintended wt loss or wt gain, classically pleuritic or exertional cp,  orthopnea pnd or arm/hand swelling  or leg swelling when upright all day, resolves overnight , presyncope, palpitations, abdominal pain, anorexia, nausea, vomiting, diarrhea  or change in bowel habits or change in bladder habits, change in stools or change in urine, dysuria, hematuria,  rash, arthralgias, visual complaints, headache, numbness, weakness or ataxia or  problems with walking or coordination,  change in mood or  memory.        Current Meds  Medication Sig   albuterol (PROVENTIL) (2.5 MG/3ML) 0.083% nebulizer solution Take 3 mLs (2.5 mg total) by nebulization every 6 (six) hours as needed for wheezing or shortness of breath.   Budeson-Glycopyrrol-Formoterol (BREZTRI AEROSPHERE) 160-9-4.8 MCG/ACT AERO Inhale 2 puffs into the lungs in the morning and at bedtime.   Evolocumab (REPATHA SURECLICK) 591 MG/ML SOAJ Inject 140 mg into the skin every 14 (fourteen) days.   famotidine (PEPCID) 20 MG tablet One after supper   furosemide (LASIX) 20 MG tablet  Take 1 tablet (20 mg total) by mouth daily as needed for edema (swelling).   ketorolac (ACULAR) 0.5 % ophthalmic solution PLACE 1 DROP INTO THE LEFT EYE 4 TIMES DAILY.   metoprolol tartrate (LOPRESSOR) 25 MG tablet Take 1 tablet (25 mg total) by mouth 2 (two) times daily.   pantoprazole (PROTONIX) 40 MG tablet Take 1 tablet (40 mg total) by mouth daily. Take 30-60 min before first meal of the day   traZODone (DESYREL) 50 MG tablet Take 1 tablet (50 mg total) by mouth at bedtime.   warfarin (COUMADIN) 5 MG tablet TAKE 1 TABLET EVERY DAY OR AS DIRECTED                  Objective:   Physical Exam   Wt  04/13/2022      167  01/19/2022          172  12/04/2021        170  09/05/2021        174  03/16/2014        174     03/09/13 179 lb (81.194 kg)  02/27/13 177 lb (80.287 kg)  01/21/13 176 lb (79.833 kg)     Vital signs reviewed  04/13/2022  - Note at rest 02 sats  96% on RA   General appearance:   amb hoarse wm/ freq throat clearing    HEENT : Oropharynx  clear no cobblestoning/ pnds - edentulous   Nasal turbinates mild edema/ non specific    NECK :  without  apparent JVD/ palpable Nodes/TM    LUNGS: no acc muscle use,  Mild barrel  contour chest wall with bilateral  Distant bs s audible wheeze and  without cough on insp or exp maneuvers  and mild  Hyperresonant  to  percussion bilaterally     CV:  RRR  no s3 or murmur or increase in P2, and no edema   ABD:  soft and nontender with pos end  insp Hoover's  in the supine position.  No bruits or organomegaly appreciated   MS:  Nl gait/ ext warm without deformities Or obvious joint restrictions  calf tenderness, cyanosis or clubbing     SKIN: warm and dry without lesions    NEURO:  alert, approp, nl sensorium with  no motor or cerebellar deficits apparent.           Assessment & Plan:

## 2022-04-17 LAB — CBC WITH DIFFERENTIAL/PLATELET
Basophils Absolute: 0.1 10*3/uL (ref 0.0–0.2)
Basos: 1 %
EOS (ABSOLUTE): 0.1 10*3/uL (ref 0.0–0.4)
Eos: 1 %
Hematocrit: 45.3 % (ref 37.5–51.0)
Hemoglobin: 15 g/dL (ref 13.0–17.7)
Immature Grans (Abs): 0 10*3/uL (ref 0.0–0.1)
Immature Granulocytes: 0 %
Lymphocytes Absolute: 1.8 10*3/uL (ref 0.7–3.1)
Lymphs: 21 %
MCH: 29.8 pg (ref 26.6–33.0)
MCHC: 33.1 g/dL (ref 31.5–35.7)
MCV: 90 fL (ref 79–97)
Monocytes Absolute: 0.7 10*3/uL (ref 0.1–0.9)
Monocytes: 8 %
Neutrophils Absolute: 5.8 10*3/uL (ref 1.4–7.0)
Neutrophils: 69 %
Platelets: 248 10*3/uL (ref 150–450)
RBC: 5.04 x10E6/uL (ref 4.14–5.80)
RDW: 12.6 % (ref 11.6–15.4)
WBC: 8.4 10*3/uL (ref 3.4–10.8)

## 2022-04-17 LAB — IGE: IgE (Immunoglobulin E), Serum: 175 IU/mL (ref 6–495)

## 2022-05-01 ENCOUNTER — Encounter: Payer: Self-pay | Admitting: Nurse Practitioner

## 2022-05-01 ENCOUNTER — Ambulatory Visit (INDEPENDENT_AMBULATORY_CARE_PROVIDER_SITE_OTHER): Payer: Medicare Other | Admitting: Nurse Practitioner

## 2022-05-01 VITALS — BP 147/79 | HR 74 | Temp 97.3°F | Resp 20 | Ht 67.0 in | Wt 165.0 lb

## 2022-05-01 DIAGNOSIS — J069 Acute upper respiratory infection, unspecified: Secondary | ICD-10-CM | POA: Diagnosis not present

## 2022-05-01 DIAGNOSIS — I6523 Occlusion and stenosis of bilateral carotid arteries: Secondary | ICD-10-CM

## 2022-05-01 MED ORDER — AMOXICILLIN-POT CLAVULANATE 875-125 MG PO TABS: 1.0000 | ORAL_TABLET | Freq: Two times a day (BID) | ORAL | 0 refills | Status: DC

## 2022-05-01 NOTE — Progress Notes (Signed)
Subjective:    Patient ID: [name], male    DOB: 11-17-1940, 81 y.o.   MRN: [id number]   Chief Complaint: Cough, Nasal Congestion (For 1 1/2 months/), Shaking off and on yesterday, and Fever yesterday   URI  The current episode started yesterday. The problem has been gradually worsening. The maximum temperature recorded prior to his arrival was 100.4 - 100.9 F. Associated symptoms include congestion, coughing and rhinorrhea. Pertinent negatives include no headaches or sore throat. Treatments tried: mucinex. The treatment provided moderate relief.   Covid test at home was negative. He says he always end up in the hospital if he does not get an antibiotic.    Review of Systems  Constitutional:  Positive for chills and fever.  HENT:  Positive for congestion and rhinorrhea. Negative for sore throat.   Respiratory:  Positive for cough.   Neurological:  Negative for headaches.       Objective:   Physical Exam Constitutional:      Appearance: Normal appearance. He is obese.  HENT:     Right Ear: Tympanic membrane normal.     Left Ear: Tympanic membrane normal.     Nose: Congestion and rhinorrhea present.     Mouth/Throat:     Pharynx: No oropharyngeal exudate or posterior oropharyngeal erythema.  Cardiovascular:     Rate and Rhythm: Normal rate and regular rhythm.     Heart sounds: Normal heart sounds.  Pulmonary:     Effort: Pulmonary effort is normal.     Breath sounds: Normal breath sounds.  Skin:    General: Skin is warm.  Neurological:     General: No focal deficit present.     Mental Status: He is alert and oriented to person, place, and time.  Psychiatric:        Mood and Affect: Mood normal.        Behavior: Behavior normal.    BP (!) 147/79   Pulse 74   Temp (!) 97.3 F (36.3 C) (Temporal)   Resp 20   Ht '5\' 7"'$  (1.702 m)   Wt 165 lb (74.8 kg)   SpO2 95%   BMI 25.84 kg/m         Assessment & Plan:   [name] in today with chief  complaint of Cough, Nasal Congestion (For 1 1/2 months/), Shaking off and on yesterday, and Fever yesterday   1. URI with cough and congestion 1. Take meds as prescribed 2. Use a cool mist humidifier especially during the winter months and when heat has been humid. 3. Use saline nose sprays frequently 4. Saline irrigations of the nose can be very helpful if done frequently.  * 4X daily for 1 week*  * Use of a nettie pot can be helpful with this. Follow directions with this* 5. Drink plenty of fluids 6. Keep thermostat turn down low 7.For any cough or congestion- mucinex OTC 8. For fever or aces or pains- take tylenol or ibuprofen appropriate for age and weight.  * for fevers greater than 101 orally you may alternate ibuprofen and tylenol every  3 hours.   Meds ordered this encounter  Medications   amoxicillin-clavulanate (AUGMENTIN) 875-125 MG tablet    Sig: Take 1 tablet by mouth 2 (two) times daily.    Dispense:  14 tablet    Refill:  0    Order Specific Question:   Supervising Provider    Answer:   Caryl Pina A [id number]  The above assessment and management plan was discussed with the patient. The patient verbalized understanding of and has agreed to the management plan. Patient is aware to call the clinic if symptoms persist or worsen. Patient is aware when to return to the clinic for a follow-up visit. Patient educated on when it is appropriate to go to the emergency department.   Mary-Margaret Jensen Cheramie, FNP   

## 2022-05-01 NOTE — Patient Instructions (Signed)
1. Take meds as prescribed 2. Use a cool mist humidifier especially during the winter months and when heat has been humid. 3. Use saline nose sprays frequently 4. Saline irrigations of the nose can be very helpful if done frequently.  * 4X daily for 1 week*  * Use of a nettie pot can be helpful with this. Follow directions with this* 5. Drink plenty of fluids 6. Keep thermostat turn down low 7.For any cough or congestion- mucinex OTC 8. For fever or aces or pains- take tylenol or ibuprofen appropriate for age and weight.  * for fevers greater than 101 orally you may alternate ibuprofen and tylenol every  3 hours.

## 2022-05-03 ENCOUNTER — Ambulatory Visit: Payer: Medicare Other | Attending: Cardiology | Admitting: *Deleted

## 2022-05-03 DIAGNOSIS — I4891 Unspecified atrial fibrillation: Secondary | ICD-10-CM | POA: Diagnosis not present

## 2022-05-03 DIAGNOSIS — Z5181 Encounter for therapeutic drug level monitoring: Secondary | ICD-10-CM | POA: Diagnosis not present

## 2022-05-03 LAB — POCT INR: INR: 2.4 (ref 2.0–3.0)

## 2022-05-03 NOTE — Patient Instructions (Signed)
Continue warfarin 1 tablet daily except 1/2 tablet on Sundays and Wednesdays Recheck in 4 wks

## 2022-05-05 ENCOUNTER — Other Ambulatory Visit: Payer: Self-pay | Admitting: Family Medicine

## 2022-05-05 DIAGNOSIS — G479 Sleep disorder, unspecified: Secondary | ICD-10-CM

## 2022-05-31 ENCOUNTER — Ambulatory Visit: Payer: Medicare Other | Attending: Cardiology | Admitting: *Deleted

## 2022-05-31 DIAGNOSIS — Z5181 Encounter for therapeutic drug level monitoring: Secondary | ICD-10-CM | POA: Diagnosis not present

## 2022-05-31 DIAGNOSIS — I4891 Unspecified atrial fibrillation: Secondary | ICD-10-CM

## 2022-05-31 LAB — POCT INR: INR: 2.2 (ref 2.0–3.0)

## 2022-05-31 NOTE — Patient Instructions (Signed)
Continue warfarin 1 tablet daily except 1/2 tablet on Sundays and Wednesdays Recheck in 6 wks

## 2022-06-19 ENCOUNTER — Other Ambulatory Visit: Payer: Self-pay | Admitting: Family Medicine

## 2022-06-19 DIAGNOSIS — J449 Chronic obstructive pulmonary disease, unspecified: Secondary | ICD-10-CM

## 2022-06-19 NOTE — Telephone Encounter (Signed)
Michelle NTBS for chronic FU RF sent in

## 2022-06-20 NOTE — Telephone Encounter (Signed)
I called pt & made him an appt w/Rakes on 07-05-2022.

## 2022-06-25 ENCOUNTER — Ambulatory Visit: Payer: Medicare HMO | Attending: Cardiology | Admitting: Cardiology

## 2022-06-25 NOTE — Progress Notes (Deleted)
Clinical Summary [name] is a 82 y.o.male  seen today for follow up of the following medical problems.      1. Chronic afib -  DOACs were too expensive in the past, he has elected to remain on coumadin.  - some fatigue on lopressor '100mg'$  bid, has been maintained on lower doses.   - seen by EP, did not meet criteria to consider watchman device   - no recent palpitations - no severe bleeding on coumadin   2. Carotid stenosis - 02/2015 carotid US: bilateral 1-39% ICA disease.   - 10/2020 mild bilateral disease   3. Hyperlipidemia - joint pains on simvaststin, atorvastatin, rosuvastatin, pravastatin. - he is on repatha, followed lipid clinic 02/2021 TC 122 TG 145 HDL 34 LDL 63 09/2021 TC 113 TG 228 HDL 36 LDL 41     4. CAD - mild disease by cath 2009 - - no chest pains     5. LE edema - occasional LE edema. Asking for prn diuretic     6. Aortic regurgitation - moderate AI by echo Jan 2020 - 10/2020 echo mild AI    7. 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 DVT  - 07/2020 [place] with deep vein thrombosis involving the left common femoral vein, femoral vein, popliteal vein and profundus femoral vein. - occurred after left inguinal hernia repair. Note coumadin was held in periop period, prior to that his INR had been subtherapeutic.    -if needs to be off in the future would consider bridging.  - he reports pcp working on [place] assistance. Reports a copay of 30-40$ dollars would be too much     9. 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 [place] Past Medical History:  Diagnosis Date   Anxiety disorder    Benign prostatic hypertrophy    Bilateral cataracts    CAD (coronary artery disease)    Catheterization 2009, mild CAD   Carotid artery disease (HCC)    Doppler, September, 2011, less than 50% bilateral   COPD (chronic obstructive pulmonary disease) (HCC)    CVA (cerebral  infarction)    Hospitalization.  MRI...    Left anterior cerebral artery territory, September, 2011... Coumadin started   DYSLIPIDEMIA 11/01/2009   Qualifier: Diagnosis of  By: Ron Parker, MD, [facility], [place]    Ejection fraction    EF 60%   Ejection fraction    EF 55-60%, echo, September, 2011   Gastroesophageal reflux disease    History of deep venous thrombosis (DVT) of distal vein of left lower extremity 08/05/2020   History of left inguinal hernia    Hyperlipidemia    Persistent atrial fibrillation (HCC)        Ptosis of eyelid, left    Surgery in the past   Sweating abnormality    Episodes of sweating appeared to be related to a delayed reaction to niacin   Vitamin D insufficiency      Allergies  Allergen Reactions   Asa [Aspirin]     GI upset   Niacin     hot flashes   Pravastatin     myalgias   Rosuvastatin     myalgias   Simvastatin     Myalgias      Current Outpatient Medications  Medication Sig Dispense Refill   albuterol (PROVENTIL) (2.5 MG/3ML) 0.083% nebulizer solution USE 1 VIAL IN NEBULIZER  EVERY 6 HOURS AS NEEDED FOR WHEEZING FOR SHORTNESS OF BREATH 180 mL 0   amoxicillin-clavulanate (AUGMENTIN) 875-125 MG tablet Take 1 tablet by mouth 2 (two) times daily. 14 tablet 0   Budeson-Glycopyrrol-Formoterol (BREZTRI AEROSPHERE) 160-9-4.8 MCG/ACT AERO Inhale 2 puffs into the lungs in the morning and at bedtime. 32.1 g 11   Evolocumab (REPATHA SURECLICK) 417 MG/ML SOAJ Inject 140 mg into the skin every 14 (fourteen) days. 6 mL 3   famotidine (PEPCID) 20 MG tablet One after supper 30 tablet 11   furosemide (LASIX) 20 MG tablet Take 1 tablet (20 mg total) by mouth daily as needed for edema (swelling). 90 tablet 1   ketorolac (ACULAR) 0.5 % ophthalmic solution PLACE 1 DROP INTO THE LEFT EYE 4 TIMES DAILY. 25 mL 0   metoprolol tartrate (LOPRESSOR) 25 MG tablet Take 1 tablet (25 mg total) by mouth 2 (two) times daily.     pantoprazole (PROTONIX) 40 MG tablet Take 1  tablet (40 mg total) by mouth daily. Take 30-60 min before first meal of the day 30 tablet 2   traZODone (DESYREL) 50 MG tablet TAKE 1 TABLET AT BEDTIME AS NEEDED FOR SLEEP 90 tablet 0   warfarin (COUMADIN) 5 MG tablet TAKE 1 TABLET EVERY DAY OR AS DIRECTED 90 tablet 1   No current facility-administered medications for this visit.     Past Surgical History:  Procedure Laterality Date   APPENDECTOMY     CATARACT EXTRACTION, BILATERAL     INGUINAL HERNIA REPAIR Left 07/02/2019   INGUINAL HERNIA REPAIR Left 07/01/2020   Procedure: LEFT INGUINAL HERNIA REPAIR WITH MESH;  Surgeon: Virl Cagey, MD;  Location: [facility];  Service: General;  Laterality: Left;   KNEE SURGERY Right 2019   torn ligament?    TRANSURETHRAL RESECTION OF PROSTATE N/A 08/19/2018   Procedure: TRANSURETHRAL RESECTION OF THE PROSTATE (TURP) OF PROSTATE ABCESS;  Surgeon: Alexis Frock, MD;  Location: [facility];  Service: Urology;  Laterality: N/A;     Allergies  Allergen Reactions   Asa [Aspirin]     GI upset   Niacin     hot flashes   Pravastatin     myalgias   Rosuvastatin     myalgias   Simvastatin     Myalgias       Family History  Problem Relation Age of Onset   Heart attack Mother    Other Other        Insignificant for premature CAD   Heart disease Sister    Hyperlipidemia Sister    Kidney disease Son    Heart attack Paternal Grandfather      Social History [name] reports that he quit smoking about 32 years ago. His smoking use included cigarettes. He has a 150.00 pack-year smoking history. He has been exposed to tobacco smoke. He quit smokeless tobacco use about 22 years ago.  His smokeless tobacco use included chew. [name] reports no history of alcohol use.   Review of Systems CONSTITUTIONAL: No weight loss, fever, chills, weakness or fatigue.  HEENT: Eyes: No visual loss, blurred vision, double vision or yellow sclerae.No hearing loss, sneezing, congestion, runny nose or sore  throat.  SKIN: No rash or itching.  CARDIOVASCULAR:  RESPIRATORY: No shortness of breath, cough or sputum.  GASTROINTESTINAL: No anorexia, nausea, vomiting or diarrhea. No abdominal pain or blood.  GENITOURINARY: No burning on urination, no polyuria NEUROLOGICAL: No headache, dizziness, syncope, paralysis, ataxia, numbness or tingling in the extremities. No change  in bowel or bladder control.  MUSCULOSKELETAL: No muscle, back pain, joint pain or stiffness.  LYMPHATICS: No enlarged nodes. No history of splenectomy.  PSYCHIATRIC: No history of depression or anxiety.  ENDOCRINOLOGIC: No reports of sweating, cold or heat intolerance. No polyuria or polydipsia.  Marland Kitchen   Physical Examination There were no vitals filed for this visit. There were no vitals filed for this visit.  Gen: resting comfortably, no acute distress HEENT: no scleral icterus, pupils equal round and reactive, no palptable cervical adenopathy,  CV Resp: Clear to auscultation bilaterally GI: abdomen is soft, non-tender, non-distended, normal bowel sounds, no hepatosplenomegaly MSK: extremities are warm, no edema.  Skin: warm, no rash Neuro:  no focal deficits Psych: appropriate affect   Diagnostic Studies 02/2015 carotid US: bilateral 1-39% ICA disease. Recs for f/u 2 years   02/2008 cath FINDINGS:  The aortic pressure 98/71 with a mean of 83, left ventricular  pressure 99/20.    Coronary angiography.  The left mainstem is very short.  There is no  stenosis present.    The LAD is a large-caliber vessel that courses down and reaches the LV  apex.  The anatomy is a bit unusual as there is a Seneca Hoback off the  proximal LAD that runs the course of an intermediate.  It supplies  multiple Lanea Vankirk vessels to the lateral wall.  There has been more of a  traditional appearing diagonal Cadi Rhinehart from further down in the mid-LAD.  The LAD at the origin of the second diagonal Markise Haymer has a 30% stenosis.  Further down in the vessel,  there is minor nonobstructive plaque but no  significant stenosis in the LAD or its Dmarion Perfect vessels.  There are two  diagonals in the intermediate territory Carmita Boom but all arising from the  proximal and mid LAD.    The left circumflex is very small.  It courses down the AV groove and  supplies no significant Reinhardt Licausi vessels.    Right coronary artery.  The right coronary artery is dominant.  Proximally, there is an eccentric 50% stenosis.  This is a smooth  appearing lesion of the vessels.  The midportion of the right coronary  artery is dilated.  There is a transition zone into more normal-  appearing vessel where there is a 30% stenosis.  There is a large acute  marginal Ashanta Amoroso and a moderate size PDA and posterolateral Johntavius Shepard.  There are no other significant stenoses noted.  With intracoronary  nitroglycerin, the lesion still appears no worse than 50%.    Left ventriculography shows low normal LV function with an LVEF in the  range of 50-55%.  There was no significant mitral regurgitation.    ASSESSMENT:  1. Mild-to-moderate right coronary artery stenosis as outlined above.  2. Minimal left anterior descending and left circumflex stenosis.  3. Normal left ventricular function.    Post cat PLAN:  Recommend medical therapy for nonobstructive CAD.  The patient  will resume Coumadin tonight.  He will drop his aspirin dose to 81 mg.  In reviewing Dr. Ron Parker notes, the patient may not require long-term  Coumadin.  Recommend aggressive secondary risk reduction for the  patient's diffuse nonobstructive CAD.     08/2016 event monitor Telemetry tracings show rate controlled atrial fibrillation No symptoms reported     10/2020 echo IMPRESSIONS     1. Left ventricular ejection fraction, by estimation, is 60 to 65%. The  left ventricle has normal function. The left ventricle has no regional  wall motion abnormalities. Left ventricular diastolic parameters are  indeterminate. Elevated left  atrial  pressure.   2. Right ventricular systolic function is normal. The right ventricular  size is normal.   3. Left atrial size was severely dilated.   4. Right atrial size was severely dilated.   5. The mitral valve is normal in structure. Trivial mitral valve  regurgitation. No evidence of mitral stenosis.   6. The aortic valve has an indeterminant number of cusps. There is mild  calcification of the aortic valve. There is mild thickening of the aortic  valve. Aortic valve regurgitation is mild. No aortic stenosis is present.   7. The inferior vena cava is normal in size with greater than 50%  respiratory variability, suggesting right atrial pressure of 3 mmHg.         Assessment and Plan   1. Chronic afib/acquired thrombophilia - DOACs have been too expensive, he prefers staying on coumadin - no symptoms, continue current meds   2. CAD - mild disease on prior cath - no recent symptoms   3. Carotid stenosis - mild disease by recent US - we will continue to monitor   4. Aortic regurgitation - mild by recent echo - continue to monitor at this time   5. HYperlipidemia - intolerant to multiple statins - LDL at goal on repatha, we discussed dietary changes to lower TGs   6. LE edema - given Rx for prn lasix, discussed to take sparingly - elevated in Cr 09/2021 labs, repeat bmet     Arnoldo Lenis, M.D., F.A.C.C.

## 2022-06-28 ENCOUNTER — Telehealth: Payer: Self-pay | Admitting: *Deleted

## 2022-06-28 DIAGNOSIS — R911 Solitary pulmonary nodule: Secondary | ICD-10-CM

## 2022-06-28 NOTE — Telephone Encounter (Signed)
-----   Message from Tanda Rockers, MD sent at 09/05/2021  4:55 PM EDT ----- F/u ct s contrast due by end of jan    if not already done since 06/2021

## 2022-06-28 NOTE — Telephone Encounter (Signed)
Called and spoke with the pt  He is aware that the is due for CT  He is agreeable to having this done  I have placed order for this and he is aware to be expecting a phone call

## 2022-07-03 ENCOUNTER — Other Ambulatory Visit (HOSPITAL_COMMUNITY): Payer: Self-pay

## 2022-07-03 DIAGNOSIS — D485 Neoplasm of uncertain behavior of skin: Secondary | ICD-10-CM | POA: Diagnosis not present

## 2022-07-03 DIAGNOSIS — C44219 Basal cell carcinoma of skin of left ear and external auricular canal: Secondary | ICD-10-CM | POA: Diagnosis not present

## 2022-07-03 DIAGNOSIS — Z85828 Personal history of other malignant neoplasm of skin: Secondary | ICD-10-CM | POA: Diagnosis not present

## 2022-07-03 DIAGNOSIS — L820 Inflamed seborrheic keratosis: Secondary | ICD-10-CM | POA: Diagnosis not present

## 2022-07-03 DIAGNOSIS — D239 Other benign neoplasm of skin, unspecified: Secondary | ICD-10-CM | POA: Diagnosis not present

## 2022-07-03 DIAGNOSIS — L570 Actinic keratosis: Secondary | ICD-10-CM | POA: Diagnosis not present

## 2022-07-05 ENCOUNTER — Ambulatory Visit: Payer: Medicare HMO | Admitting: Family Medicine

## 2022-07-05 ENCOUNTER — Encounter: Payer: Self-pay | Admitting: Family Medicine

## 2022-07-05 VITALS — BP 132/62 | HR 82 | Temp 98.0°F | Ht 67.0 in | Wt 163.4 lb

## 2022-07-05 DIAGNOSIS — R7303 Prediabetes: Secondary | ICD-10-CM

## 2022-07-05 DIAGNOSIS — N182 Chronic kidney disease, stage 2 (mild): Secondary | ICD-10-CM

## 2022-07-05 DIAGNOSIS — E782 Mixed hyperlipidemia: Secondary | ICD-10-CM | POA: Diagnosis not present

## 2022-07-05 DIAGNOSIS — I4811 Longstanding persistent atrial fibrillation: Secondary | ICD-10-CM | POA: Diagnosis not present

## 2022-07-05 DIAGNOSIS — J449 Chronic obstructive pulmonary disease, unspecified: Secondary | ICD-10-CM

## 2022-07-05 LAB — BAYER DCA HB A1C WAIVED: HB A1C (BAYER DCA - WAIVED): 6.2 % — ABNORMAL HIGH (ref 4.8–5.6)

## 2022-07-05 MED ORDER — ALBUTEROL SULFATE HFA 108 (90 BASE) MCG/ACT IN AERS: 2.0000 | INHALATION_SPRAY | Freq: Four times a day (QID) | RESPIRATORY_TRACT | 11 refills | Status: DC | PRN

## 2022-07-05 MED ORDER — ALBUTEROL SULFATE (2.5 MG/3ML) 0.083% IN NEBU: INHALATION_SOLUTION | RESPIRATORY_TRACT | 11 refills | Status: DC

## 2022-07-05 NOTE — Progress Notes (Signed)
Subjective:  Patient ID: [name], male    DOB: August 27, 1940, 82 y.o.   MRN: [id number]  Patient Care Team: Baruch Gouty, FNP as PCP - General (Family Medicine) Harl Bowie Alphonse Guild, MD as PCP - Cardiology (Cardiology) Harl Bowie Alphonse Guild, MD as Consulting Physician (Cardiology) Irine Seal, MD as Attending Physician (Urology) Lavera Guise, [facility] (Pharmacist)   Chief Complaint:  Establish Care Blanch Media patient ) and Medical Management of Chronic Issues   HPI: [name] is a 82 y.o. male presenting on 07/05/2022 for Establish Care Blanch Media patient ) and Medical Management of Chronic Issues   Pt presents today to establish care with new PCP and form management of chronic medical conditions. He has a history of Vit D deficiency, last Vit D level normal. He is on a multivitamin daily. He has prediabetes and tries to follow a healthy diet. He is active daily but states he has been feeling some fatigue over the last several months. No other associated symptoms.  COPD is managed by Dr. Melvyn Novas who he sees in a few weeks. States he is out of his albuterol and needs a refill today. No worsening COPD symptoms.  A-Fib and hyperlipidemia are managed by cardiology and lipid clinic. He denies chest pain, palpitations, shortness of breath, dizziness, or weakness. No abnormal bleeding or bruising. Denies changes in urine output. No confusion or focal neurological deficits.    Relevant past medical, surgical, family, and social history reviewed and updated as indicated.  Allergies and medications reviewed and updated. Data reviewed: Chart in Epic.   Past Medical History:  Diagnosis Date   Anxiety disorder    Benign prostatic hypertrophy    Bilateral cataracts    CAD (coronary artery disease)    Catheterization 2009, mild CAD   Carotid artery disease (HCC)    Doppler, September, 2011, less than 50% bilateral   COPD (chronic obstructive pulmonary disease) (HCC)    CVA (cerebral infarction)     Hospitalization.  MRI...    Left anterior cerebral artery territory, September, 2011... Coumadin started   DYSLIPIDEMIA 11/01/2009   Qualifier: Diagnosis of  By: Ron Parker, MD, [facility]    Ejection fraction    EF 60%   Ejection fraction    EF 55-60%, echo, September, 2011   Gastroesophageal reflux disease    History of deep venous thrombosis (DVT) of distal vein of left lower extremity 08/05/2020   History of left inguinal hernia    Hyperlipidemia    Persistent atrial fibrillation (HCC)        Ptosis of eyelid, left    Surgery in the past   Sweating abnormality    Episodes of sweating appeared to be related to a delayed reaction to niacin   Vitamin D insufficiency     Past Surgical History:  Procedure Laterality Date   APPENDECTOMY     CATARACT EXTRACTION, BILATERAL     INGUINAL HERNIA REPAIR Left 07/02/2019   INGUINAL HERNIA REPAIR Left 07/01/2020   Procedure: LEFT INGUINAL HERNIA REPAIR WITH MESH;  Surgeon: Virl Cagey, MD;  Location: [facility];  Service: General;  Laterality: Left;   KNEE SURGERY Right 2019   torn ligament?    TRANSURETHRAL RESECTION OF PROSTATE N/A 08/19/2018   Procedure: TRANSURETHRAL RESECTION OF THE PROSTATE (TURP) OF PROSTATE ABCESS;  Surgeon: Alexis Frock, MD;  Location: [facility];  Service: Urology;  Laterality: N/A;    Social History   Socioeconomic History   Marital status: Married  Spouse name: Dorion Petillo   Number of children: 5   Years of education: 11   Highest education level: 11th grade  Occupational History   Occupation: Retired Statistician   Tobacco Use   Smoking status: Former    Packs/day: 3.00    Years: 50.00    Total pack years: 150.00    Types: Cigarettes    Quit date: 03/18/1990    Years since quitting: 32.3    Passive exposure: Past   Smokeless tobacco: Former    Types: Chew    Quit date: 06/18/2000  Vaping Use   Vaping Use: Never used  Substance and Sexual Activity   Alcohol use: No    Alcohol/week:  0.0 standard drinks of alcohol   Drug use: No   Sexual activity: Not Currently    Partners: Female  Other Topics Concern   Not on file  Social History Narrative   Quit smoking in 1993. No significant alcohol abuse. Retired from trucking.    Social Determinants of Health   Financial Resource Strain: Low Risk  (03/27/2022)   Overall Financial Resource Strain (CARDIA)    Difficulty of Paying Living Expenses: Not hard at all  Food Insecurity: No Food Insecurity (03/27/2022)   Hunger Vital Sign    Worried About Running Out of Food in the Last Year: Never true    Ran Out of Food in the Last Year: Never true  Transportation Needs: No Transportation Needs (03/27/2022)   PRAPARE - Hydrologist (Medical): No    Lack of Transportation (Non-Medical): No  Physical Activity: Sufficiently Active (03/27/2022)   Exercise Vital Sign    Days of Exercise per Week: 5 days    Minutes of Exercise per Session: 30 min  Stress: No Stress Concern Present (03/27/2022)   [place]    Feeling of Stress : Not at all  Social Connections: [place] (03/27/2022)   Social Connection and Isolation Panel [NHANES]    Frequency of Communication with Friends and Family: More than three times a week    Frequency of Social Gatherings with Friends and Family: More than three times a week    Attends Religious Services: More than 4 times per year    Active Member of [employer] or Organizations: Yes    Attends Music therapist: More than 4 times per year    Marital Status: Married  Human resources officer Violence: Not At Risk (03/27/2022)   Humiliation, Afraid, Rape, and Kick questionnaire    Fear of Current or Ex-Partner: No    Emotionally Abused: No    Physically Abused: No    Sexually Abused: No    Outpatient Encounter Medications as of 07/05/2022  Medication Sig   albuterol (VENTOLIN HFA) 108 (90 Base)  MCG/ACT inhaler Inhale 2 puffs into the lungs every 6 (six) hours as needed for wheezing or shortness of breath.   Budeson-Glycopyrrol-Formoterol (BREZTRI AEROSPHERE) 160-9-4.8 MCG/ACT AERO Inhale 2 puffs into the lungs in the morning and at bedtime.   Evolocumab (REPATHA SURECLICK) 951 MG/ML SOAJ Inject 140 mg into the skin every 14 (fourteen) days.   famotidine (PEPCID) 20 MG tablet One after supper   furosemide (LASIX) 20 MG tablet Take 1 tablet (20 mg total) by mouth daily as needed for edema (swelling).   ketorolac (ACULAR) 0.5 % ophthalmic solution PLACE 1 DROP INTO THE LEFT EYE 4 TIMES DAILY.   metoprolol tartrate (LOPRESSOR) 25 MG tablet Take  1 tablet (25 mg total) by mouth 2 (two) times daily.   pantoprazole (PROTONIX) 40 MG tablet Take 1 tablet (40 mg total) by mouth daily. Take 30-60 min before first meal of the day   traZODone (DESYREL) 50 MG tablet TAKE 1 TABLET AT BEDTIME AS NEEDED FOR SLEEP   warfarin (COUMADIN) 5 MG tablet TAKE 1 TABLET EVERY DAY OR AS DIRECTED   [DISCONTINUED] albuterol (PROVENTIL) (2.5 MG/3ML) 0.083% nebulizer solution USE 1 VIAL IN NEBULIZER EVERY 6 HOURS AS NEEDED FOR WHEEZING FOR SHORTNESS OF BREATH   albuterol (PROVENTIL) (2.5 MG/3ML) 0.083% nebulizer solution USE 1 VIAL IN NEBULIZER EVERY 6 HOURS AS NEEDED FOR WHEEZING FOR SHORTNESS OF BREATH   [DISCONTINUED] amoxicillin-clavulanate (AUGMENTIN) 875-125 MG tablet Take 1 tablet by mouth 2 (two) times daily.   No facility-administered encounter medications on file as of 07/05/2022.    Allergies  Allergen Reactions   Asa [Aspirin]     GI upset   Niacin     hot flashes   Pravastatin     myalgias   Rosuvastatin     myalgias   Simvastatin     Myalgias     Review of Systems  Constitutional:  Positive for fatigue. Negative for activity change, appetite change, chills, diaphoresis, fever and unexpected weight change.  HENT: Negative.    Eyes: Negative.  Negative for photophobia and visual disturbance.   Respiratory:  Positive for cough and wheezing. Negative for chest tightness and shortness of breath.   Cardiovascular:  Negative for chest pain, palpitations and leg swelling.  Gastrointestinal:  Negative for anal bleeding, blood in stool, constipation, diarrhea, nausea and vomiting.  Endocrine: Negative.   Genitourinary:  Negative for decreased urine volume, difficulty urinating, dysuria, frequency, hematuria and urgency.  Musculoskeletal:  Negative for arthralgias and myalgias.  Skin: Negative.   Allergic/Immunologic: Negative.   Neurological:  Negative for dizziness, tremors, seizures, syncope, facial asymmetry, speech difficulty, weakness, light-headedness, numbness and headaches.  Hematological: Negative.  Does not bruise/bleed easily.  Psychiatric/Behavioral:  Negative for confusion, hallucinations, sleep disturbance and suicidal ideas.   All other systems reviewed and are negative.       Objective:  BP 132/62   Pulse 82   Temp 98 F (36.7 C) (Temporal)   Ht '5\' 7"'$  (1.702 m)   Wt 163 lb 6.4 oz (74.1 kg)   SpO2 93%   BMI 25.59 kg/m    Wt Readings from Last 3 Encounters:  07/05/22 163 lb 6.4 oz (74.1 kg)  05/01/22 165 lb (74.8 kg)  04/13/22 167 lb (75.8 kg)    Physical Exam Vitals and nursing note reviewed.  Constitutional:      General: He is not in acute distress.    Appearance: Normal appearance. He is well-developed, well-groomed and normal weight. He is not ill-appearing, toxic-appearing or diaphoretic.  HENT:     Head: Normocephalic and atraumatic.     Jaw: There is normal jaw occlusion.     Right Ear: Hearing normal.     Left Ear: Hearing normal.     Nose: Nose normal.     Mouth/Throat:     Lips: Pink.     Mouth: Mucous membranes are moist.     Pharynx: Oropharynx is clear. Uvula midline.  Eyes:     General: Lids are normal.     Extraocular Movements: Extraocular movements intact.     Conjunctiva/sclera: Conjunctivae normal.     Pupils: Pupils are  equal, round, and reactive to light.  Neck:  Thyroid: No thyroid mass, thyromegaly or thyroid tenderness.     Vascular: No carotid bruit or JVD.     Trachea: Trachea and phonation normal.  Cardiovascular:     Rate and Rhythm: Normal rate. Rhythm irregularly irregular.     Chest Wall: PMI is not displaced.     Pulses: Normal pulses.     Heart sounds: No murmur heard.    No friction rub. No gallop.  Pulmonary:     Effort: Pulmonary effort is normal. No respiratory distress.     Breath sounds: Normal breath sounds. No wheezing.  Abdominal:     General: Bowel sounds are normal. There is no distension or abdominal bruit.     Palpations: Abdomen is soft. There is no hepatomegaly or splenomegaly.     Tenderness: There is no abdominal tenderness. There is no right CVA tenderness or left CVA tenderness.     Hernia: No hernia is present.  Musculoskeletal:        General: Normal range of motion.     Cervical back: Normal range of motion and neck supple.     Right lower leg: No edema.     Left lower leg: No edema.  Lymphadenopathy:     Cervical: No cervical adenopathy.  Skin:    General: Skin is warm and dry.     Capillary Refill: Capillary refill takes less than 2 seconds.     Coloration: Skin is not cyanotic, jaundiced or pale.     Findings: No rash.  Neurological:     General: No focal deficit present.     Mental Status: He is alert and oriented to person, place, and time.     Sensory: Sensation is intact.     Motor: Motor function is intact.     Coordination: Coordination is intact.     Gait: Gait is intact.     Deep Tendon Reflexes: Reflexes are normal and symmetric.  Psychiatric:        Attention and Perception: Attention and perception normal.        Mood and Affect: Mood and affect normal.        Speech: Speech normal.        Behavior: Behavior normal. Behavior is cooperative.        Thought Content: Thought content normal.        Cognition and Memory: Cognition and memory  normal.        Judgment: Judgment normal.     Results for orders placed or performed in visit on 05/31/22  POCT INR  Result Value Ref Range   INR 2.2 2.0 - 3.0   POC INR         Pertinent labs & imaging results that were available during my care of the patient were reviewed by me and considered in my medical decision making.  Assessment & Plan:  Ott was seen today for establish care and medical management of chronic issues.  Diagnoses and all orders for this visit:  Mixed hyperlipidemia On repatha and followed the lipid clinic.  -     Lipid panel -     CBC with Differential/Platelet -     CMP14+EGFR  Prediabetes A1C 6.2. Continue with diet and exercise. Avoid excessive sugary foods and drinks. -     Bayer DCA Hb A1c Waived  Longstanding persistent atrial fibrillation ([place]) Followed by Dr. Harl Bowie on a regular basis. On anticoagulation and rate control. Doing well.  -     Thyroid Panel With  TSH  Chronic kidney disease (CKD), stage II (mild) Labs pending. Discussed avoiding nephrotoxic medications and to stay adequately hydrated. Labs pending.  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 Has an upcoming appointment. Does need refills on albuterol today, will send in.  -     albuterol (VENTOLIN HFA) 108 (90 Base) MCG/ACT inhaler; Inhale 2 puffs into the lungs every 6 (six) hours as needed for wheezing or shortness of breath. -     albuterol (PROVENTIL) (2.5 MG/3ML) 0.083% nebulizer solution; USE 1 VIAL IN NEBULIZER EVERY 6 HOURS AS NEEDED FOR WHEEZING FOR SHORTNESS OF BREATH     Continue all other maintenance medications.  Follow up plan: Return in about 6 months (around 01/03/2023) for chronic follow up, need Vit D.   Continue healthy lifestyle choices, including diet (rich in fruits, vegetables, and lean proteins, and low in salt and simple carbohydrates) and exercise (at  least 30 minutes of moderate physical activity daily).  Educational handout given for health maintenance   The above assessment and management plan was discussed with the patient. The patient verbalized understanding of and has agreed to the management plan. Patient is aware to call the clinic if they develop any new symptoms or if symptoms persist or worsen. Patient is aware when to return to the clinic for a follow-up visit. Patient educated on when it is appropriate to go to the emergency department.   Monia Pouch, FNP-C [facility] [phone]

## 2022-07-06 LAB — THYROID PANEL WITH TSH
Free Thyroxine Index: 2.1 (ref 1.2–4.9)
T3 Uptake Ratio: 31 % (ref 24–39)
T4, Total: 6.9 ug/dL (ref 4.5–12.0)
TSH: 2.79 u[IU]/mL (ref 0.450–4.500)

## 2022-07-06 LAB — CMP14+EGFR
ALT: 11 IU/L (ref 0–44)
AST: 19 IU/L (ref 0–40)
Albumin/Globulin Ratio: 1.7 (ref 1.2–2.2)
Albumin: 3.9 g/dL (ref 3.7–4.7)
Alkaline Phosphatase: 68 IU/L (ref 44–121)
BUN/Creatinine Ratio: 17 (ref 10–24)
BUN: 22 mg/dL (ref 8–27)
Bilirubin Total: 0.4 mg/dL (ref 0.0–1.2)
CO2: 25 mmol/L (ref 20–29)
Calcium: 9.4 mg/dL (ref 8.6–10.2)
Chloride: 102 mmol/L (ref 96–106)
Creatinine, Ser: 1.32 mg/dL — ABNORMAL HIGH (ref 0.76–1.27)
Globulin, Total: 2.3 g/dL (ref 1.5–4.5)
Glucose: 161 mg/dL — ABNORMAL HIGH (ref 70–99)
Potassium: 4 mmol/L (ref 3.5–5.2)
Sodium: 144 mmol/L (ref 134–144)
Total Protein: 6.2 g/dL (ref 6.0–8.5)
eGFR: 54 mL/min/{1.73_m2} — ABNORMAL LOW (ref >59)

## 2022-07-06 LAB — CBC WITH DIFFERENTIAL/PLATELET
Basophils Absolute: 0 10*3/uL (ref 0.0–0.2)
Basos: 0 %
EOS (ABSOLUTE): 0 10*3/uL (ref 0.0–0.4)
Eos: 0 %
Hematocrit: 45.2 % (ref 37.5–51.0)
Hemoglobin: 14.5 g/dL (ref 13.0–17.7)
Immature Grans (Abs): 0 10*3/uL (ref 0.0–0.1)
Immature Granulocytes: 0 %
Lymphocytes Absolute: 2 10*3/uL (ref 0.7–3.1)
Lymphs: 22 %
MCH: 29.1 pg (ref 26.6–33.0)
MCHC: 32.1 g/dL (ref 31.5–35.7)
MCV: 91 fL (ref 79–97)
Monocytes Absolute: 0.5 10*3/uL (ref 0.1–0.9)
Monocytes: 6 %
Neutrophils Absolute: 6.5 10*3/uL (ref 1.4–7.0)
Neutrophils: 72 %
Platelets: 255 10*3/uL (ref 150–450)
RBC: 4.98 x10E6/uL (ref 4.14–5.80)
RDW: 12.8 % (ref 11.6–15.4)
WBC: 9.1 10*3/uL (ref 3.4–10.8)

## 2022-07-06 LAB — LIPID PANEL
Chol/HDL Ratio: 3.2 ratio (ref 0.0–5.0)
Cholesterol, Total: 123 mg/dL (ref 100–199)
HDL: 38 mg/dL — ABNORMAL LOW (ref >39)
LDL Chol Calc (NIH): 46 mg/dL (ref 0–99)
Triglycerides: 251 mg/dL — ABNORMAL HIGH (ref 0–149)
VLDL Cholesterol Cal: 39 mg/dL (ref 5–40)

## 2022-07-12 ENCOUNTER — Ambulatory Visit: Payer: Medicare HMO | Attending: Cardiology | Admitting: *Deleted

## 2022-07-12 DIAGNOSIS — Z5181 Encounter for therapeutic drug level monitoring: Secondary | ICD-10-CM

## 2022-07-12 DIAGNOSIS — I4891 Unspecified atrial fibrillation: Secondary | ICD-10-CM | POA: Diagnosis not present

## 2022-07-12 LAB — POCT INR: INR: 1.8 — AB (ref 2.0–3.0)

## 2022-07-12 NOTE — Patient Instructions (Signed)
Take warfarin 1 1/2 tablets tonight and tomorrow night then resume 1 tablet daily except 1/2 tablet on Sundays and Wednesdays Recheck in 4 wks

## 2022-07-19 ENCOUNTER — Other Ambulatory Visit: Payer: Self-pay | Admitting: Family Medicine

## 2022-07-19 DIAGNOSIS — G479 Sleep disorder, unspecified: Secondary | ICD-10-CM

## 2022-07-19 DIAGNOSIS — C44229 Squamous cell carcinoma of skin of left ear and external auricular canal: Secondary | ICD-10-CM | POA: Diagnosis not present

## 2022-07-19 NOTE — Telephone Encounter (Signed)
Last office visit 07/05/22 Last refill 05/07/22, 90, no refills

## 2022-07-20 ENCOUNTER — Telehealth: Payer: Self-pay

## 2022-07-20 NOTE — Telephone Encounter (Signed)
08/13/22 ok

## 2022-07-20 NOTE — Telephone Encounter (Signed)
-----   Message from Vivia Budge sent at 06/28/2022  3:21 PM EST ----- Regarding: CT order CT is scheduled for 2/26 and I do see a comment on the CT order to be due now, is this considered an ASAP or Urgent request?   Wanted to ensure that date was okay since its the next avail date for [facility].   Thanks

## 2022-07-20 NOTE — Telephone Encounter (Signed)
Dr. Melvyn Novas,  Is the CT scheduled for 08/13/22 okay or do we need to place this order as ASAP or Urgent Request? Please advise.

## 2022-07-21 ENCOUNTER — Other Ambulatory Visit: Payer: Self-pay | Admitting: Cardiology

## 2022-07-23 ENCOUNTER — Other Ambulatory Visit: Payer: Self-pay | Admitting: *Deleted

## 2022-08-09 ENCOUNTER — Ambulatory Visit: Payer: Medicare HMO | Attending: Cardiology | Admitting: *Deleted

## 2022-08-09 DIAGNOSIS — I4891 Unspecified atrial fibrillation: Secondary | ICD-10-CM

## 2022-08-09 DIAGNOSIS — Z5181 Encounter for therapeutic drug level monitoring: Secondary | ICD-10-CM | POA: Diagnosis not present

## 2022-08-09 LAB — POCT INR: INR: 3.2 — AB (ref 2.0–3.0)

## 2022-08-09 NOTE — Patient Instructions (Signed)
Take warfarin 1/2 tablet tonight then resume 1 tablet daily except 1/2 tablet on Sundays and Wednesdays Recheck in 4 wks

## 2022-08-13 ENCOUNTER — Ambulatory Visit (HOSPITAL_COMMUNITY)
Admission: RE | Admit: 2022-08-13 | Discharge: 2022-08-13 | Disposition: A | Discharge status: Home / Self Care | Payer: Medicare HMO | Source: Ambulatory Visit | Attending: Internal Medicine | Admitting: Internal Medicine

## 2022-08-13 DIAGNOSIS — R911 Solitary pulmonary nodule: Secondary | ICD-10-CM | POA: Diagnosis not present

## 2022-08-13 DIAGNOSIS — R918 Other nonspecific abnormal finding of lung field: Secondary | ICD-10-CM | POA: Diagnosis not present

## 2022-08-15 ENCOUNTER — Ambulatory Visit: Payer: Medicare HMO | Attending: Nurse Practitioner | Admitting: Nurse Practitioner

## 2022-08-15 ENCOUNTER — Telehealth: Payer: Self-pay | Admitting: Pharmacist Clinician (PhC)/ Clinical Pharmacy Specialist

## 2022-08-15 ENCOUNTER — Encounter: Payer: Self-pay | Admitting: Nurse Practitioner

## 2022-08-15 VITALS — BP 142/82 | HR 69 | Ht 66.5 in | Wt 166.0 lb

## 2022-08-15 DIAGNOSIS — I351 Nonrheumatic aortic (valve) insufficiency: Secondary | ICD-10-CM | POA: Diagnosis not present

## 2022-08-15 DIAGNOSIS — I2510 Atherosclerotic heart disease of native coronary artery without angina pectoris: Secondary | ICD-10-CM

## 2022-08-15 DIAGNOSIS — R600 Localized edema: Secondary | ICD-10-CM | POA: Diagnosis not present

## 2022-08-15 DIAGNOSIS — I4891 Unspecified atrial fibrillation: Secondary | ICD-10-CM | POA: Diagnosis not present

## 2022-08-15 DIAGNOSIS — I10 Essential (primary) hypertension: Secondary | ICD-10-CM

## 2022-08-15 DIAGNOSIS — I6523 Occlusion and stenosis of bilateral carotid arteries: Secondary | ICD-10-CM

## 2022-08-15 DIAGNOSIS — E785 Hyperlipidemia, unspecified: Secondary | ICD-10-CM

## 2022-08-15 MED ORDER — FUROSEMIDE 20 MG PO TABS: ORAL_TABLET | ORAL | 1 refills | Status: DC

## 2022-08-15 NOTE — Patient Instructions (Signed)
Medication Instructions:  Your physician recommends that you continue on your current medications as directed. Please refer to the Current Medication list given to you today.  I refilled your Lasix  Labwork: None today  Testing/Procedures: None today  Follow-Up: 6 months with Dr.Branch  Any Other Special Instructions Will Be Listed Below (If Applicable).  If you need a refill on your cardiac medications before your next appointment, please call your pharmacy.

## 2022-08-15 NOTE — Progress Notes (Signed)
Cardiology Office Note:    Date:  08/15/2022   ID:  [name], [name] [birth year], MRN [id number]  PCP:  Baruch Gouty, [place] [facility] Cardiologist:  Carlyle Dolly, MD     Referring MD: Baruch Gouty, FNP   CC: Here for follow-up  History of Present Illness:    [name] is a 82 y.o. male with a hx of the following:   Chronic A-fib CAD HLD HTN Carotid artery stenosis Leg edema Aortic regurgitation Hx of DVT Vertigo  Cardiac catheterization in 2009 revealed mild CAD.  Last seen by Dr. Carlyle Dolly on December 29, 2021.  Was doing well at the time.  Was instructed to begin Lasix 20 mg as needed for swelling.  Today he presents for follow-up.  He states he is doing well.  Denies any acute or recent changes to his health. Denies any chest pain, shortness of breath, palpitations, syncope, presyncope, dizziness, orthopnea, PND, significant weight changes, acute bleeding, or claudication.  Does admit to stable leg edema, takes Lasix every night, and swelling improves by the morning.  Does get interrupted sleep from taking Lasix in the evening time.  SH: Retired Air traffic controller.  Enjoys going to [facility] and cutting wood at his home.  Past Medical History:  Diagnosis Date   Anxiety disorder    Benign prostatic hypertrophy    Bilateral cataracts    CAD (coronary artery disease)    Catheterization 2009, mild CAD   Carotid artery disease (HCC)    Doppler, September, 2011, less than 50% bilateral   COPD (chronic obstructive pulmonary disease) (HCC)    CVA (cerebral infarction)    Hospitalization.  MRI...    Left anterior cerebral artery territory, September, 2011... Coumadin started   DYSLIPIDEMIA 11/01/2009   Qualifier: Diagnosis of  By: Ron Parker, MD, [facility] [place]    Ejection fraction    EF 60%   Ejection fraction    EF 55-60%, echo, September, 2011   Gastroesophageal reflux disease    History of deep venous thrombosis (DVT) of  distal vein of left lower extremity 08/05/2020   History of left inguinal hernia    Hyperlipidemia    Persistent atrial fibrillation (HCC)        Ptosis of eyelid, left    Surgery in the past   Sweating abnormality    Episodes of sweating appeared to be related to a delayed reaction to niacin   Vitamin D insufficiency     Past Surgical History:  Procedure Laterality Date   APPENDECTOMY     CATARACT EXTRACTION, BILATERAL     INGUINAL HERNIA REPAIR Left 07/02/2019   INGUINAL HERNIA REPAIR Left 07/01/2020   Procedure: LEFT INGUINAL HERNIA REPAIR WITH MESH;  Surgeon: Virl Cagey, MD;  Location: [facility];  Service: General;  Laterality: Left;   KNEE SURGERY Right 2019   torn ligament?    TRANSURETHRAL RESECTION OF PROSTATE N/A 08/19/2018   Procedure: TRANSURETHRAL RESECTION OF THE PROSTATE (TURP) OF PROSTATE ABCESS;  Surgeon: Alexis Frock, MD;  Location: [facility];  Service: Urology;  Laterality: N/A;    Current Medications: Current Meds  Medication Sig   albuterol (PROVENTIL) (2.5 MG/3ML) 0.083% nebulizer solution USE 1 VIAL IN NEBULIZER EVERY 6 HOURS AS NEEDED FOR WHEEZING FOR SHORTNESS OF BREATH   albuterol (VENTOLIN HFA) 108 (90 Base) MCG/ACT inhaler Inhale 2 puffs into the lungs every 6 (six) hours as needed for wheezing or shortness of breath.  Budeson-Glycopyrrol-Formoterol (BREZTRI AEROSPHERE) 160-9-4.8 MCG/ACT AERO Inhale 2 puffs into the lungs in the morning and at bedtime.   Evolocumab (REPATHA SURECLICK) [id number] MG/ML SOAJ Inject 140 mg into the skin every 14 (fourteen) days.   famotidine (PEPCID) 20 MG tablet One after supper   ketorolac (ACULAR) 0.5 % ophthalmic solution PLACE 1 DROP INTO THE LEFT EYE 4 TIMES DAILY.   metoprolol tartrate (LOPRESSOR) 25 MG tablet Take 1 tablet (25 mg total) by mouth 2 (two) times daily.   pantoprazole (PROTONIX) 40 MG tablet Take 1 tablet (40 mg total) by mouth daily. Take 30-60 min before first meal of the day   traZODone (DESYREL) 50 MG  tablet TAKE 1 TABLET AT BEDTIME AS NEEDED FOR SLEEP   warfarin (COUMADIN) 5 MG tablet TAKE 1 TABLET EVERY DAY OR AS DIRECTED   furosemide (LASIX) 20 MG tablet TAKE 1 TABLET DAILY AS NEEDED FOR EDEMA (SWELLING).     Allergies:   Asa [aspirin], Niacin, Pravastatin, Rosuvastatin, and Simvastatin   Social History   Socioeconomic History   Marital status: Married    Spouse name: Thuy Isaiah   Number of children: 5   Years of education: 11   Highest education level: 11th grade  Occupational History   Occupation: Retired Statistician   Tobacco Use   Smoking status: Former    Packs/day: 3.00    Years: 50.00    Total pack years: 150.00    Types: Cigarettes    Quit date: 03/18/1990    Years since quitting: 32.4    Passive exposure: Past   Smokeless tobacco: Former    Types: Chew    Quit date: 06/18/2000  Vaping Use   Vaping Use: Never used  Substance and Sexual Activity   Alcohol use: No    Alcohol/week: 0.0 standard drinks of alcohol   Drug use: No   Sexual activity: Not Currently    Partners: Female  Other Topics Concern   Not on file  Social History Narrative   Quit smoking in 1993. No significant alcohol abuse. Retired from trucking.    Social Determinants of Health   Financial Resource Strain: Low Risk  (03/27/2022)   Overall Financial Resource Strain (CARDIA)    Difficulty of Paying Living Expenses: Not hard at all  Food Insecurity: No Food Insecurity (03/27/2022)   Hunger Vital Sign    Worried About Running Out of Food in the Last Year: Never true    Ran Out of Food in the Last Year: Never true  Transportation Needs: No Transportation Needs (03/27/2022)   PRAPARE - Hydrologist (Medical): No    Lack of Transportation (Non-Medical): No  Physical Activity: Sufficiently Active (03/27/2022)   Exercise Vital Sign    Days of Exercise per Week: 5 days    Minutes of Exercise per Session: 30 min  Stress: No Stress Concern Present  (03/27/2022)   [place]    Feeling of Stress : Not at all  Social Connections: [place] (03/27/2022)   Social Connection and Isolation Panel [NHANES]    Frequency of Communication with Friends and Family: More than three times a week    Frequency of Social Gatherings with Friends and Family: More than three times a week    Attends Religious Services: More than 4 times per year    Active Member of [employer] or Organizations: Yes    Attends Archivist Meetings: More than 4 times per  year    Marital Status: Married     Family History: The patient's family history includes Heart attack in his mother and paternal grandfather; Heart disease in his sister; Hyperlipidemia in his sister; Kidney disease in his son; Other in an other family member.  ROS:   Please see the history of present illness.     All other systems reviewed and are negative.  EKGs/Labs/Other Studies Reviewed:    The following studies were reviewed today:   EKG:  EKG is  ordered today.  The ekg ordered today demonstrates atrial fibrillation, 70 bpm, ST/T wave changes, otherwise nothing acute.   Carotid duplex on Oct 27, 2020: Summary:  Right Carotid: Velocities in the right ICA are consistent with a 1-39%  stenosis.   Left Carotid: Velocities in the left ICA are consistent with a 1-39%  stenosis.   Vertebrals: Bilateral vertebral arteries demonstrate antegrade flow.  Subclavians: Normal flow hemodynamics were seen in bilateral subclavian               arteries.  Echocardiogram on Oct 27, 2020:  1. Left ventricular ejection fraction, by estimation, is 60 to 65%. The  left ventricle has normal function. The left ventricle has no regional  wall motion abnormalities. Left ventricular diastolic parameters are  indeterminate. Elevated left atrial  pressure.   2. Right ventricular systolic function is normal. The right ventricular   size is normal.   3. Left atrial size was severely dilated.   4. Right atrial size was severely dilated.   5. The mitral valve is normal in structure. Trivial mitral valve  regurgitation. No evidence of mitral stenosis.   6. The aortic valve has an indeterminant number of cusps. There is mild  calcification of the aortic valve. There is mild thickening of the aortic  valve. Aortic valve regurgitation is mild. No aortic stenosis is present.   7. The inferior vena cava is normal in size with greater than 50%  respiratory variability, suggesting right atrial pressure of 3 mmHg.   Comparison(s): Prior Echo showed LV EF 60-65%, moderate AI.  Recent Labs: 07/05/2022: ALT 11; BUN 22; Creatinine, Ser 1.32; Hemoglobin 14.5; Platelets 255; Potassium 4.0; Sodium 144; TSH 2.790  Recent Lipid Panel    Component Value Date/Time   CHOL 123 07/05/2022 1145   TRIG 251 (H) 07/05/2022 1145   HDL 38 (L) 07/05/2022 1145   CHOLHDL 3.2 07/05/2022 1145   LDLCALC 46 07/05/2022 1145     Risk Assessment/Calculations:    CHA2DS2-VASc Score = 6  his indicates a 9.7% annual risk of stroke. The patient's score is based upon: CHF History: 0 HTN History: 1 Diabetes History: 0 Stroke History: 2 Vascular Disease History: 1 Age Score: 2 Gender Score: 0     HYPERTENSION CONTROL Vitals:   08/15/22 0816 08/15/22 0855  BP: (!) 174/80 (!) 142/82    The patient's blood pressure is elevated above target today.  In order to address the patient's elevated BP: Blood pressure will be monitored at home to determine if medication changes need to be made.; The blood pressure is usually elevated in clinic.  Blood pressures monitored at home have been optimal.; Follow up with general cardiology has been recommended.; Follow up with primary care provider for management.            Physical Exam:    VS:  BP (!) 142/82 (BP Location: Right Arm, Patient Position: Sitting, Cuff Size: Normal)   Pulse 69   Ht 5'  6.5" (1.689 m)   Wt 166 lb (75.3 kg)   SpO2 96%   BMI 26.39 kg/m     Wt Readings from Last 3 Encounters:  08/15/22 166 lb (75.3 kg)  07/05/22 163 lb 6.4 oz (74.1 kg)  05/01/22 165 lb (74.8 kg)     GEN: Well nourished, well developed in no acute distress HEENT: Normal NECK: No JVD; No carotid bruits CARDIAC: S1/S2, irregular rhythm and regular rate,, no murmurs, rubs, gallops RESPIRATORY:  Clear to auscultation without rales, wheezing or rhonchi  MUSCULOSKELETAL: +1 pitting edema along bilateral lower extremities; No deformity  SKIN: Warm and dry NEUROLOGIC:  Alert and oriented x 3 PSYCHIATRIC:  Normal affect   ASSESSMENT:    1. Atrial fibrillation, unspecified type ([place])   2. Coronary artery disease involving native coronary artery of native heart without angina pectoris   3. Bilateral carotid artery stenosis   4. Hyperlipidemia, unspecified hyperlipidemia type   5. Leg edema   6. Hypertension, unspecified type   7. Nonrheumatic aortic valve insufficiency    PLAN:    In order of problems listed above:  Chronic A-fib Denies any palpitations or tachycardia.  Heart rate well-controlled today.  Continue Lopressor.  Denies any bleeding while on Coumadin.  Continue to follow-up at Coumadin clinic as scheduled. Heart healthy diet and regular cardiovascular exercise encouraged.   CAD Stable with no anginal symptoms. No indication for ischemic evaluation.  Not on aspirin due to being on Coumadin.  Continue current medication regimen. Heart healthy diet and regular cardiovascular exercise encouraged.   Carotid artery stenosis, hyperlipidemia Carotid Doppler in 2022 revealed 1 to 39% stenosis along bilateral ICAs.  Continue current medication regimen.  Denies any issues.  Will continue to monitor.  Leg edema Stable.  Instructed and recommended to wear compression stockings. Low salt, heart healthy diet and regular cardiovascular exercise encouraged.  Recommended to take Lasix  earlier in the day to avoid interrupted sleep.  He verbalized understanding. Continue to follow with PCP.  Will refill Lasix per his request.  5. HTN Elevated BP in office.  Does admit to whitecoat hypertension.  Per his report, BP is well-controlled at home. Discussed to monitor BP at home at least 2 hours after medications and sitting for 5-10 minutes. Heart healthy diet and regular cardiovascular exercise encouraged.  If BP is not controlled by next office visit, recommend increasing Lopressor or starting low dose Aldactone.    6. Aortic regurgitation Mild regurgitation noted along aortic valve in 2022.  Denies any symptoms.  Recommend updating echocardiogram in the next 3 to 5 years or sooner if clinically indicated. Heart healthy diet and regular cardiovascular exercise encouraged.     7.  Disposition: Follow-up with Dr. Carlyle Dolly in 6 months or sooner if anything changes.    Medication Adjustments/Labs and Tests Ordered: Current medicines are reviewed at length with the patient today.  Concerns regarding medicines are outlined above.  Orders Placed This Encounter  Procedures   EKG 12-Lead   Meds ordered this encounter  Medications   furosemide (LASIX) 20 MG tablet    Sig: TAKE 1 TABLET DAILY AS NEEDED FOR EDEMA (SWELLING).    Dispense:  90 tablet    Refill:  1    Patient Instructions  Medication Instructions:  Your physician recommends that you continue on your current medications as directed. Please refer to the Current Medication list given to you today.  I refilled your Lasix  Labwork: None today  Testing/Procedures: None today  Follow-Up: 6 months with Dr.Branch  Any Other Special Instructions Will Be Listed Below (If Applicable).  If you need a refill on your cardiac medications before your next appointment, please call your pharmacy.    Signed, Finis Bud, NP  08/15/2022 9:04 AM    [place]

## 2022-08-15 NOTE — Telephone Encounter (Signed)
[place] for [place] good to 07/16/23  ID [id number]  BIN  [id number] PCN   [id number] GRP  [id number]

## 2022-08-16 NOTE — Progress Notes (Signed)
Called the pt and there was no answer- LMTCB.  

## 2022-08-24 ENCOUNTER — Telehealth: Payer: Self-pay

## 2022-08-24 NOTE — Telephone Encounter (Signed)
Ok to add to Friday pm schedule

## 2022-08-24 NOTE — Telephone Encounter (Signed)
Dr. Melvyn Novas,  spoke with patient today.  You wanted to see him to review CT scan.  You do not have any openings in [place] next week. Please advise.

## 2022-08-27 NOTE — Telephone Encounter (Signed)
Patient is returning phone call. Patient phone number is [phone].

## 2022-08-27 NOTE — Telephone Encounter (Signed)
Scheduled patient for ov on Friday of this week in [facility] office

## 2022-08-31 ENCOUNTER — Encounter: Payer: Self-pay | Admitting: Internal Medicine

## 2022-08-31 ENCOUNTER — Ambulatory Visit (INDEPENDENT_AMBULATORY_CARE_PROVIDER_SITE_OTHER): Payer: Medicare HMO | Admitting: Internal Medicine

## 2022-08-31 VITALS — BP 155/71 | HR 83 | Ht 66.0 in | Wt 167.8 lb

## 2022-08-31 DIAGNOSIS — R918 Other nonspecific abnormal finding of lung field: Secondary | ICD-10-CM

## 2022-08-31 DIAGNOSIS — R058 Other specified cough: Secondary | ICD-10-CM | POA: Diagnosis not present

## 2022-08-31 DIAGNOSIS — J449 Chronic obstructive pulmonary disease, unspecified: Secondary | ICD-10-CM

## 2022-08-31 DIAGNOSIS — M7989 Other specified soft tissue disorders: Secondary | ICD-10-CM | POA: Diagnosis not present

## 2022-08-31 DIAGNOSIS — R911 Solitary pulmonary nodule: Secondary | ICD-10-CM | POA: Diagnosis not present

## 2022-08-31 MED ORDER — BUDESONIDE-FORMOTEROL FUMARATE 80-4.5 MCG/ACT IN AERO: INHALATION_SPRAY | RESPIRATORY_TRACT | 12 refills | Status: DC

## 2022-08-31 NOTE — Patient Instructions (Addendum)
Symbicort 80 Take 2 puffs first thing in am and then another 2 puffs about 12 hours later.    My office will be contacting you by phone for referral to PET  and venous dopplers  - if you don't hear back from my office within one week please call us back or notify us thru MyChart and we'll address it right away.   Elevate your legs higher than your heart at night and wear elastic knee high socks in daytime.  Please schedule a follow up visit in 3 months but call sooner if needed

## 2022-08-31 NOTE — Progress Notes (Unsigned)
Subjective:   Patient ID: [name], male    DOB: 03-11-1941    MRN: [id number]   Brief patient profile:  33 yowm MM/quit smoking 1991 with cough resolved then recurred in 2011 treated as pna and symptoms since then of recurrent pattern spring and fall cough assoc with nasal symptoms completely resolved  After zpak and prednisone then this episode Dec 2013 never completely resolved and had CT chest 12/09/12 with as dz so referred 12/18/2012 to pulmonary by Dr Scotty Court p starting levaquin 12/15/12. 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 Assoc with mild doe but not adls  rec Finish levaquin but have inr checked on Monday July 7 Stop fish oil GERD  For cough ok to try delsym cough syrup or mucindex dm up to 1200 every 12 hours    01/19/2022  f/u ov/[name] office/[name] re: copd 2  maint on breztri/ gerd rx   Chief Complaint  Patient presents with   Follow-up    No changes with breathing since last ov.    Dyspnea:  mb and back s stopping and shop and back is farther than MB and still does ok Cough: slt yellowish first thing in am x 8 weeks  Sleeping: L side/flat no noct cough  SABA use: none  02: none  Covid status: vax x 2/ ? Infected  Rec Bed blocks about 6 - 8 inches Zpak  Blow breztri out through threw nose / remember arm and hammer toothpaste  For cough/ congestion > mucinex dm 1200 mg every 12 hours as needed      04/13/2022  f/u ov/[name] office/[name] re: GOLD 2  maint on breztr   Chief Complaint  Patient presents with   Follow-up    Has had a cough for the last month or so. Productive with yellow and white sputum.   Dyspnea: no change mb/shop walking  Cough: mostly in am x sev minutes daily x one month Sleeping: L side flat bed no resp cc SABA use: no hfa or neb needed  02: none  Rec Prednisone 10 mg  take  4 each am x 2 days,   2 each am x 2 days,  1 each am x 2 days and stop  Zpak  If cough / hoarseness continue may need to see ENT next  Please remember to go to the lab department   for your tests -  Please schedule a follow up visit in6 months but call sooner if needed     08/31/2022  f/u ov/[name] office/[name] re: GOLD 2  maint on breztri   Chief Complaint  Patient presents with   Follow-up    PT f/u to discuss CT results, no other concerns    Dyspnea:  no change doe  Cough: p stirs am cough slt yellowish  x one minute Sleeping: flat bed on side one pillow does fine s noct cough  SABA use: once every 6 weeks neb no others  02: none      No obvious day to day or daytime variability or assoc excess/ purulent sputum or mucus plugs or hemoptysis or cp or chest tightness, subjective wheeze or overt sinus or hb symptoms.   *** without nocturnal  or early am exacerbation  of respiratory  c/o's or need for noct saba. Also denies any obvious fluctuation  of symptoms with weather or environmental changes or other aggravating or alleviating factors except as outlined above   No unusual exposure hx or h/o childhood pna/ asthma or knowledge of premature birth.  Current Allergies, Complete Past Medical History, Past Surgical History, Family History, and Social History were reviewed in [employer] record.  ROS  The following are not active complaints unless bolded Hoarseness, sore throat, dysphagia, dental problems, itching, sneezing,  nasal congestion or discharge of excess mucus or purulent secretions, ear ache,   fever, chills, sweats, unintended wt loss or wt gain, classically pleuritic or exertional cp,  orthopnea pnd or arm/hand swelling  or leg swelling, presyncope, palpitations, abdominal pain, anorexia, nausea, vomiting, diarrhea  or change in bowel habits or change in bladder habits, change in stools or change in urine, dysuria, hematuria,  rash, arthralgias,  visual complaints, headache, numbness, weakness or ataxia or problems with walking or coordination,  change in mood or  memory.        Current Meds  Medication Sig   albuterol (PROVENTIL) (2.5 MG/3ML) 0.083% nebulizer solution USE 1 VIAL IN NEBULIZER EVERY 6 HOURS AS NEEDED FOR WHEEZING FOR SHORTNESS OF BREATH   albuterol (VENTOLIN HFA) 108 (90 Base) MCG/ACT inhaler Inhale 2 puffs into the lungs every 6 (six) hours as needed for wheezing or shortness of breath.   Budeson-Glycopyrrol-Formoterol (BREZTRI AEROSPHERE) 160-9-4.8 MCG/ACT AERO Inhale 2 puffs into the lungs in the morning and at bedtime.   Evolocumab (REPATHA SURECLICK) [id number] MG/ML SOAJ Inject 140 mg into the skin every 14 (fourteen) days.   famotidine (PEPCID) 20 MG tablet One after supper   furosemide (LASIX) 20 MG tablet TAKE 1 TABLET DAILY AS NEEDED FOR EDEMA (SWELLING).   ketorolac (ACULAR) 0.5 % ophthalmic solution PLACE 1 DROP INTO THE LEFT EYE 4 TIMES DAILY.   metoprolol tartrate (LOPRESSOR) 25 MG tablet Take 1 tablet (25 mg total) by mouth 2 (two) times daily.   pantoprazole (PROTONIX) 40 MG tablet Take 1 tablet (40 mg total) by mouth daily. Take 30-60 min before first meal of the day   traZODone (DESYREL) 50 MG tablet TAKE 1 TABLET AT BEDTIME AS NEEDED FOR SLEEP   warfarin (COUMADIN) 5 MG tablet TAKE 1 TABLET EVERY DAY OR AS DIRECTED                   Objective:   Physical Exam  Wt  08/31/2022         ***  04/13/2022      167  01/19/2022          172  12/04/2021        170  09/05/2021        174  03/16/2014        174     03/09/13 179 lb (81.194 kg)  02/27/13 177 lb (80.287 kg)  01/21/13 176 lb (79.833 kg)     Vital signs reviewed  08/31/2022  - Note at rest 02 sats  ***% on ***   General appearance:    ***    Mild barr***            Assessment & Plan:

## 2022-09-01 ENCOUNTER — Encounter: Payer: Self-pay | Admitting: Internal Medicine

## 2022-09-01 NOTE — Assessment & Plan Note (Signed)
Quit smoking 1991/ MM  - PFT's 2014  FEV1 1.10 (44%) ratio 54 and 41% reversibilty  DLCO 69 corrects to 99% and 41% improvement p saba  - 09/05/2021  After extensive coaching inhaler device,  effectiveness =  80%  > breztri Take 2 puffs first thing in am and then another 2 puffs about 12 hours later. - PFT's  09/18/21  FEV1 1.49  (65 % ) ratio 0.55  p 25 % improvement from saba p ? 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

## 2022-09-01 NOTE — Assessment & Plan Note (Signed)
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  Total time for H and P, chart review, counseling, reviewing h device(s) and generating customized AVS unique to this office visit / same day charting  > 30 min for multiple   respiratory  symptoms/problems  of uncertain etiology

## 2022-09-01 NOTE — Assessment & Plan Note (Signed)
Quit smoking 1991 CTa [facility] 07/18/21  Stable LUL GG nodule vs 12/2019 with rec q12 m x 3 years total -  CT chest  08/13/22 1. 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 Solid component in subsolid nodule in the LEFT upper lobe nearly 6 cm. Could consider short interval follow-up CT at 3 months or PET imaging in the short interval as well for further evaluation. 2. Multifocal nodularity and ground-glass elsewhere as discussed compatible with infectious or inflammatory changes. For this reason either CT or PET imaging should be performed after some delay, between 8 and 12 weeks, after therapy as appropriate. 3. Aortic atherosclerosis and coronary artery disease. - 08/31/2022 rec PET   I am more concerned about the upper lobe nodules than then GG change else where which I suspect are secondary to MAI  which is really minimally symptomatic so ok to proceed with PET now   Discussed in detail all the  indications, usual  risks and alternatives  relative to the benefits with patient who agrees to proceed with w/u as outlined.

## 2022-09-03 ENCOUNTER — Other Ambulatory Visit: Payer: Self-pay | Admitting: *Deleted

## 2022-09-03 ENCOUNTER — Other Ambulatory Visit: Payer: Self-pay | Admitting: Internal Medicine

## 2022-09-03 MED ORDER — METOPROLOL TARTRATE 25 MG PO TABS: 25.0000 mg | ORAL_TABLET | Freq: Two times a day (BID) | ORAL | 2 refills | Status: DC

## 2022-09-05 ENCOUNTER — Other Ambulatory Visit: Payer: Self-pay | Admitting: Cardiology

## 2022-09-05 DIAGNOSIS — I4891 Unspecified atrial fibrillation: Secondary | ICD-10-CM

## 2022-09-06 ENCOUNTER — Encounter (HOSPITAL_COMMUNITY)
Admission: RE | Admit: 2022-09-06 | Discharge: 2022-09-06 | Disposition: A | Discharge status: Home / Self Care | Payer: Medicare HMO | Source: Ambulatory Visit | Attending: Internal Medicine | Admitting: Internal Medicine

## 2022-09-06 ENCOUNTER — Ambulatory Visit (INDEPENDENT_AMBULATORY_CARE_PROVIDER_SITE_OTHER): Payer: Medicare HMO

## 2022-09-06 DIAGNOSIS — Z5181 Encounter for therapeutic drug level monitoring: Secondary | ICD-10-CM | POA: Diagnosis not present

## 2022-09-06 DIAGNOSIS — I4891 Unspecified atrial fibrillation: Secondary | ICD-10-CM | POA: Insufficient documentation

## 2022-09-06 DIAGNOSIS — R918 Other nonspecific abnormal finding of lung field: Secondary | ICD-10-CM

## 2022-09-06 LAB — POCT INR: INR: 1.6 — AB (ref 2.0–3.0)

## 2022-09-06 MED ORDER — FLUDEOXYGLUCOSE F - 18 (FDG) INJECTION
8.9600 | Freq: Once | INTRAVENOUS | Status: AC | PRN
  Administered 2022-09-06: 8.96 via INTRAVENOUS

## 2022-09-06 NOTE — Patient Instructions (Signed)
Description   Take 1.5 tablets today and tomorrow, then resume same dosage of Warfarin 1 tablet daily except 1/2 tablet on Sundays and Wednesdays Recheck in 2 wks

## 2022-09-13 ENCOUNTER — Other Ambulatory Visit: Payer: Self-pay | Admitting: Internal Medicine

## 2022-09-13 DIAGNOSIS — R918 Other nonspecific abnormal finding of lung field: Secondary | ICD-10-CM

## 2022-09-13 NOTE — Progress Notes (Signed)
Spoke with the pt and notified of results/recs per Dr Melvyn Novas. He verbalized understanding. Order placed for 6 mo ct.

## 2022-09-20 ENCOUNTER — Ambulatory Visit: Payer: Medicare HMO | Attending: Cardiology | Admitting: *Deleted

## 2022-09-20 DIAGNOSIS — Z5181 Encounter for therapeutic drug level monitoring: Secondary | ICD-10-CM

## 2022-09-20 DIAGNOSIS — I4891 Unspecified atrial fibrillation: Secondary | ICD-10-CM | POA: Diagnosis not present

## 2022-09-20 LAB — POCT INR: INR: 2.4 (ref 2.0–3.0)

## 2022-09-20 NOTE — Patient Instructions (Signed)
Continue warfarin 1 tablet daily except 1/2 tablet on Sundays and Wednesdays Recheck in 4 wks 

## 2022-10-04 ENCOUNTER — Other Ambulatory Visit: Payer: Self-pay | Admitting: Internal Medicine

## 2022-10-05 ENCOUNTER — Ambulatory Visit (INDEPENDENT_AMBULATORY_CARE_PROVIDER_SITE_OTHER): Payer: Medicare HMO | Admitting: Family Medicine

## 2022-10-05 ENCOUNTER — Encounter: Payer: Self-pay | Admitting: Family Medicine

## 2022-10-05 VITALS — BP 146/82 | HR 75 | Temp 97.2°F | Ht 66.0 in | Wt 164.0 lb

## 2022-10-05 DIAGNOSIS — R7303 Prediabetes: Secondary | ICD-10-CM | POA: Diagnosis not present

## 2022-10-05 DIAGNOSIS — E782 Mixed hyperlipidemia: Secondary | ICD-10-CM

## 2022-10-05 DIAGNOSIS — I872 Venous insufficiency (chronic) (peripheral): Secondary | ICD-10-CM

## 2022-10-05 LAB — CMP14+EGFR
ALT: 10 IU/L (ref 0–44)
AST: 20 IU/L (ref 0–40)
Albumin/Globulin Ratio: 1.6 (ref 1.2–2.2)
Albumin: 4 g/dL (ref 3.7–4.7)
Alkaline Phosphatase: 74 IU/L (ref 44–121)
BUN/Creatinine Ratio: 12 (ref 10–24)
BUN: 15 mg/dL (ref 8–27)
Bilirubin Total: 0.5 mg/dL (ref 0.0–1.2)
CO2: 28 mmol/L (ref 20–29)
Calcium: 9.1 mg/dL (ref 8.6–10.2)
Chloride: 103 mmol/L (ref 96–106)
Creatinine, Ser: 1.23 mg/dL (ref 0.76–1.27)
Globulin, Total: 2.5 g/dL (ref 1.5–4.5)
Glucose: 116 mg/dL — ABNORMAL HIGH (ref 70–99)
Potassium: 3.7 mmol/L (ref 3.5–5.2)
Sodium: 146 mmol/L — ABNORMAL HIGH (ref 134–144)
Total Protein: 6.5 g/dL (ref 6.0–8.5)
eGFR: 58 mL/min/{1.73_m2} — ABNORMAL LOW (ref >59)

## 2022-10-05 LAB — LIPID PANEL
Chol/HDL Ratio: 3.1 ratio (ref 0.0–5.0)
Cholesterol, Total: 116 mg/dL (ref 100–199)
HDL: 38 mg/dL — ABNORMAL LOW (ref >39)
LDL Chol Calc (NIH): 59 mg/dL (ref 0–99)
Triglycerides: 98 mg/dL (ref 0–149)
VLDL Cholesterol Cal: 19 mg/dL (ref 5–40)

## 2022-10-05 LAB — BAYER DCA HB A1C WAIVED: HB A1C (BAYER DCA - WAIVED): 6.2 % — ABNORMAL HIGH (ref 4.8–5.6)

## 2022-10-05 NOTE — Addendum Note (Signed)
Addended by: Sonny Masters on: 10/05/2022 11:15 AM   Modules accepted: Level of Service

## 2022-10-05 NOTE — Patient Instructions (Signed)

## 2022-10-05 NOTE — Progress Notes (Signed)
Subjective:  Patient ID: [name], male    DOB: August 06, 1940, 82 y.o.   MRN: [id number]  Patient Care Team: Sonny Masters, FNP as PCP - General (Family Medicine) Wyline Mood Dorothe Pea, MD as PCP - Cardiology (Cardiology) Wyline Mood Dorothe Pea, MD as Consulting Physician (Cardiology) Bjorn Pippin, MD as Attending Physician (Urology) Danella Maiers, [facility] (Pharmacist)   Chief Complaint:  Medical Management of Chronic Issues (3 month follow up)   HPI: [name] is a 82 y.o. male presenting on 10/05/2022 for Medical Management of Chronic Issues (3 month follow up)   1. Prediabetes Has been doing with well diet and staying active. No polyuria, polyphagia, or polydipsia. Does report some swelling to his LLE with some changes in skin color. No wounds or pain reported. This has been present for several weeks. Not worsening. No worsening shortness of breath. No orthopnea or PND.   2. Mixed hyperlipidemia On Repatha and followed by the lipid clinic. Denies adverse side effects. No chest pain or worsening shortness of breath. Does follow a healthy diet and is active daily.      Relevant past medical, surgical, family, and social history reviewed and updated as indicated.  Allergies and medications reviewed and updated. Data reviewed: Chart in Epic.   Past Medical History:  Diagnosis Date   Anxiety disorder    Benign prostatic hypertrophy    Bilateral cataracts    CAD (coronary artery disease)    Catheterization 2009, mild CAD   Carotid artery disease    Doppler, September, 2011, less than 50% bilateral   COPD (chronic obstructive pulmonary disease)    CVA (cerebral infarction)    Hospitalization.  MRI...    Left anterior cerebral artery territory, September, 2011... Coumadin started   DYSLIPIDEMIA 11/01/2009   Qualifier: Diagnosis of  By: Myrtis Ser, MD, [facility], Lemmie Evens    Ejection fraction    EF 60%   Ejection fraction    EF 55-60%, echo, September, 2011   Gastroesophageal  reflux disease    History of deep venous thrombosis (DVT) of distal vein of left lower extremity 08/05/2020   History of left inguinal hernia    Hyperlipidemia    Persistent atrial fibrillation        Ptosis of eyelid, left    Surgery in the past   Sweating abnormality    Episodes of sweating appeared to be related to a delayed reaction to niacin   Vitamin D insufficiency     Past Surgical History:  Procedure Laterality Date   APPENDECTOMY     CATARACT EXTRACTION, BILATERAL     INGUINAL HERNIA REPAIR Left 07/02/2019   INGUINAL HERNIA REPAIR Left 07/01/2020   Procedure: LEFT INGUINAL HERNIA REPAIR WITH MESH;  Surgeon: Lucretia Roers, MD;  Location: [facility];  Service: General;  Laterality: Left;   KNEE SURGERY Right 2019   torn ligament?    TRANSURETHRAL RESECTION OF PROSTATE N/A 08/19/2018   Procedure: TRANSURETHRAL RESECTION OF THE PROSTATE (TURP) OF PROSTATE ABCESS;  Surgeon: Sebastian Ache, MD;  Location: [facility];  Service: Urology;  Laterality: N/A;    Social History   Socioeconomic History   Marital status: Married    Spouse name: Jadarion Halbig   Number of children: 5   Years of education: 11   Highest education level: 11th grade  Occupational History   Occupation: Retired Biochemist, clinical   Tobacco Use   Smoking status: Former    Packs/day: 3.00    Years:  50.00    Additional pack years: 0.00    Total pack years: 150.00    Types: Cigarettes    Quit date: 03/18/1990    Years since quitting: 32.5    Passive exposure: Past   Smokeless tobacco: Former    Types: Chew    Quit date: 06/18/2000  Vaping Use   Vaping Use: Never used  Substance and Sexual Activity   Alcohol use: No    Alcohol/week: 0.0 standard drinks of alcohol   Drug use: No   Sexual activity: Not Currently    Partners: Female  Other Topics Concern   Not on file  Social History Narrative   Quit smoking in 1993. No significant alcohol abuse. Retired from trucking.    Social Determinants of Health    Financial Resource Strain: Low Risk  (03/27/2022)   Overall Financial Resource Strain (CARDIA)    Difficulty of Paying Living Expenses: Not hard at all  Food Insecurity: No Food Insecurity (03/27/2022)   Hunger Vital Sign    Worried About Running Out of Food in the Last Year: Never true    Ran Out of Food in the Last Year: Never true  Transportation Needs: No Transportation Needs (03/27/2022)   PRAPARE - Administrator, Civil Service (Medical): No    Lack of Transportation (Non-Medical): No  Physical Activity: Sufficiently Active (03/27/2022)   Exercise Vital Sign    Days of Exercise per Week: 5 days    Minutes of Exercise per Session: 30 min  Stress: No Stress Concern Present (03/27/2022)   [employer] of Occupational Health - Occupational Stress Questionnaire    Feeling of Stress : Not at all  Social Connections: Socially Integrated (03/27/2022)   Social Connection and Isolation Panel [NHANES]    Frequency of Communication with Friends and Family: More than three times a week    Frequency of Social Gatherings with Friends and Family: More than three times a week    Attends Religious Services: More than 4 times per year    Active Member of [employer] or Organizations: Yes    Attends Engineer, structural: More than 4 times per year    Marital Status: Married  Catering manager Violence: Not At Risk (03/27/2022)   Humiliation, Afraid, Rape, and Kick questionnaire    Fear of Current or Ex-Partner: No    Emotionally Abused: No    Physically Abused: No    Sexually Abused: No    Outpatient Encounter Medications as of 10/05/2022  Medication Sig   albuterol (PROVENTIL) (2.5 MG/3ML) 0.083% nebulizer solution USE 1 VIAL IN NEBULIZER EVERY 6 HOURS AS NEEDED FOR WHEEZING FOR SHORTNESS OF BREATH   albuterol (VENTOLIN HFA) 108 (90 Base) MCG/ACT inhaler Inhale 2 puffs into the lungs every 6 (six) hours as needed for wheezing or shortness of breath.    budesonide-formoterol (SYMBICORT) 80-4.5 MCG/ACT inhaler Take 2 puffs first thing in am and then another 2 puffs about 12 hours later.   Evolocumab (REPATHA SURECLICK) 140 MG/ML SOAJ Inject 140 mg into the skin every 14 (fourteen) days.   famotidine (PEPCID) 20 MG tablet TAKE 1 TABLET EVERY DAY AFTER SUPPER   furosemide (LASIX) 20 MG tablet TAKE 1 TABLET DAILY AS NEEDED FOR EDEMA (SWELLING).   ketorolac (ACULAR) 0.5 % ophthalmic solution PLACE 1 DROP INTO THE LEFT EYE 4 TIMES DAILY.   metoprolol tartrate (LOPRESSOR) 25 MG tablet Take 1 tablet (25 mg total) by mouth 2 (two) times daily.   pantoprazole (PROTONIX) 40  MG tablet TAKE 1 TABLET BY MOUTH ONCE DAILY 30-60  MINUTES  BEFORE  FIRST  MEAL  OF  THE  DAY   traZODone (DESYREL) 50 MG tablet TAKE 1 TABLET AT BEDTIME AS NEEDED FOR SLEEP   warfarin (COUMADIN) 5 MG tablet TAKE 1/2 TO 1 TABLET BY MOUTH ONCE DAILY AS DIRECTED BY COUMADIN CLINIC   No facility-administered encounter medications on file as of 10/05/2022.    Allergies  Allergen Reactions   Asa [Aspirin]     GI upset   Niacin     hot flashes   Pravastatin     myalgias   Rosuvastatin     myalgias   Simvastatin     Myalgias     Review of Systems  Constitutional:  Negative for activity change, appetite change, chills, diaphoresis, fatigue, fever and unexpected weight change.  HENT: Negative.    Eyes: Negative.  Negative for photophobia and visual disturbance.  Respiratory:  Negative for cough, chest tightness and shortness of breath.   Cardiovascular:  Positive for leg swelling. Negative for chest pain and palpitations.  Gastrointestinal:  Negative for blood in stool, constipation, diarrhea, nausea and vomiting.  Endocrine: Negative.  Negative for polydipsia, polyphagia and polyuria.  Genitourinary:  Negative for decreased urine volume, difficulty urinating, dysuria, frequency and urgency.  Musculoskeletal:  Negative for arthralgias and myalgias.  Skin:  Positive for color  change.  Allergic/Immunologic: Negative.   Neurological:  Negative for dizziness, tremors, seizures, syncope, facial asymmetry, speech difficulty, weakness, light-headedness, numbness and headaches.  Hematological: Negative.   Psychiatric/Behavioral:  Negative for confusion, hallucinations, sleep disturbance and suicidal ideas.   All other systems reviewed and are negative.       Objective:  BP (!) 146/82 (BP Location: Right Arm, Cuff Size: Normal)   Pulse 75   Temp (!) 97.2 F (36.2 C) (Temporal)   Ht 5\' 6"  (1.676 m)   Wt 164 lb (74.4 kg)   SpO2 95%   BMI 26.47 kg/m    Wt Readings from Last 3 Encounters:  10/05/22 164 lb (74.4 kg)  08/31/22 167 lb 12.8 oz (76.1 kg)  08/15/22 166 lb (75.3 kg)    Physical Exam Vitals and nursing note reviewed.  Constitutional:      General: He is not in acute distress.    Appearance: Normal appearance. He is well-developed and well-groomed. He is not ill-appearing, toxic-appearing or diaphoretic.  HENT:     Head: Normocephalic and atraumatic.     Jaw: There is normal jaw occlusion.     Right Ear: Hearing normal.     Left Ear: Hearing normal.     Nose: Nose normal.     Mouth/Throat:     Lips: Pink.     Mouth: Mucous membranes are moist.     Pharynx: Oropharynx is clear. Uvula midline.  Eyes:     General: Lids are normal.     Extraocular Movements: Extraocular movements intact.     Conjunctiva/sclera: Conjunctivae normal.     Pupils: Pupils are equal, round, and reactive to light.  Neck:     Thyroid: No thyroid mass, thyromegaly or thyroid tenderness.     Vascular: No carotid bruit or JVD.     Trachea: Trachea and phonation normal.  Cardiovascular:     Rate and Rhythm: Normal rate and regular rhythm.     Chest Wall: PMI is not displaced.     Pulses: Normal pulses.     Heart sounds: Normal heart sounds. No murmur heard.  No friction rub. No gallop.     Comments: VV to bilateral lower extremities, hyperpigmentation to left ankle  and shin. No erythema.  Pulmonary:     Effort: Pulmonary effort is normal. No respiratory distress.     Breath sounds: Normal breath sounds. No wheezing.  Abdominal:     General: Bowel sounds are normal. There is no distension or abdominal bruit.     Palpations: Abdomen is soft. There is no hepatomegaly or splenomegaly.     Tenderness: There is no abdominal tenderness. There is no right CVA tenderness or left CVA tenderness.     Hernia: No hernia is present.  Musculoskeletal:     Cervical back: Normal range of motion and neck supple.     Right lower leg: No edema.     Left lower leg: 1+ Edema present.     Comments: Kyphosis  Lymphadenopathy:     Cervical: No cervical adenopathy.  Skin:    General: Skin is warm and dry.     Capillary Refill: Capillary refill takes less than 2 seconds.     Coloration: Skin is not cyanotic, jaundiced or pale.     Findings: No rash.  Neurological:     General: No focal deficit present.     Mental Status: He is alert and oriented to person, place, and time.     Sensory: Sensation is intact.     Motor: Motor function is intact.     Coordination: Coordination is intact.     Gait: Gait is intact.     Deep Tendon Reflexes: Reflexes are normal and symmetric.  Psychiatric:        Attention and Perception: Attention and perception normal.        Mood and Affect: Mood and affect normal.        Speech: Speech normal.        Behavior: Behavior normal. Behavior is cooperative.        Thought Content: Thought content normal.        Cognition and Memory: Cognition and memory normal.        Judgment: Judgment normal.     Results for orders placed or performed in visit on 09/20/22  POCT INR  Result Value Ref Range   INR 2.4 2.0 - 3.0   POC INR         Pertinent labs & imaging results that were available during my care of the patient were reviewed by me and considered in my medical decision making.  Assessment & Plan:  Rutilio was seen today for medical  management of chronic issues.  Diagnoses and all orders for this visit:  Prediabetes A1C 6.2 today,no changes. Continue with diet and exercise. Follow up in 6 months for reevaluation.  -     Bayer DCA Hb A1c Waived  Mixed hyperlipidemia Diet encouraged - increase intake of fresh fruits and vegetables, increase intake of lean proteins. Bake, broil, or grill foods. Avoid fried, greasy, and fatty foods. Avoid fast foods. Increase intake of fiber-rich whole grains. Exercise encouraged - at least 150 minutes per week and advance as tolerated.  Goal BMI < 25. Continue medications as prescribed. Follow up in 3-6 months as discussed.  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 Report new or worsening symptoms.    Continue all other maintenance medications.  Follow up plan: Return in  about 6 months (around 04/06/2023) for Chronic follow up.   Continue healthy lifestyle choices, including diet (rich in fruits, vegetables, and lean proteins, and low in salt and simple carbohydrates) and exercise (at least 30 minutes of moderate physical activity daily).  Educational handout given for DM  The above assessment and management plan was discussed with the patient. The patient verbalized understanding of and has agreed to the management plan. Patient is aware to call the clinic if they develop any new symptoms or if symptoms persist or worsen. Patient is aware when to return to the clinic for a follow-up visit. Patient educated on when it is appropriate to go to the emergency department.   Kari Baars, FNP-C [facility] [phone]

## 2022-10-18 ENCOUNTER — Ambulatory Visit: Payer: Medicare HMO | Attending: Cardiology | Admitting: *Deleted

## 2022-10-18 DIAGNOSIS — I4891 Unspecified atrial fibrillation: Secondary | ICD-10-CM | POA: Diagnosis not present

## 2022-10-18 DIAGNOSIS — Z5181 Encounter for therapeutic drug level monitoring: Secondary | ICD-10-CM

## 2022-10-18 LAB — POCT INR: INR: 2.1 (ref 2.0–3.0)

## 2022-10-18 NOTE — Patient Instructions (Signed)
Continue warfarin 1 tablet daily except 1/2 tablet on Sundays and Wednesdays Recheck in 4 wks 

## 2022-10-19 DIAGNOSIS — M25562 Pain in left knee: Secondary | ICD-10-CM | POA: Diagnosis not present

## 2022-10-21 NOTE — Progress Notes (Unsigned)
Subjective:   Patient ID: [name], male    DOB: December 04, 1940    MRN: [id number]   Brief patient profile:  66 yowm MM/quit smoking 1991 with cough resolved then recurred in 2011 treated as pna and symptoms since then of recurrent pattern spring and fall cough assoc with nasal symptoms completely resolved  After zpak and prednisone then this episode Dec 2013 never completely resolved and had CT chest 12/09/12 with as dz so referred 12/18/2012 to pulmonary by Dr Margo Common p starting levaquin 12/15/12. 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 Assoc with mild doe but not adls  rec Finish levaquin but have inr checked on Monday July 7 Stop fish oil GERD  For cough ok to try delsym cough syrup or mucindex dm up to 1200 every 12 hours         04/13/2022  f/u ov/[place] office/[name] re: GOLD 2  maint on breztr   Chief Complaint  Patient presents with   Follow-up    Has had a cough for the last month or so. Productive with yellow and white sputum.   Dyspnea: no change mb/shop walking  Cough: mostly in am x sev minutes daily x one month Sleeping: L side flat bed no resp cc SABA use: no hfa or neb needed  02: none  Rec Prednisone 10 mg take  4 each am x 2 days,   2 each am x 2 days,  1 each am x 2 days and stop  Zpak  If cough / hoarseness continue may need to see ENT next  Please remember to go to the lab department   for your tests -  Please schedule a follow up visit in 6 months but call sooner if needed     08/31/2022  f/u ov/[place] office/[name] re: GOLD 2  maint on breztri   Chief Complaint  Patient presents with   Follow-up    PT f/u to discuss CT results, no other concerns   Dyspnea:  no change doe  Cough: p stirs am cough slt yellowish  x one minute then resolves, never bloody  Sleeping: flat bed on side one  pillow does fine s noct cough  SABA use: once every 6 weeks neb no others  02: none  Rec Symbicort 80 Take 2 puffs first thing in am and then another 2 puffs about 12 hours later.     venous dopplers ordered. Not done ***        10/22/2022  f/u ov/[place] office/[name] re: GOLD 2/ spn neg pet   maint on ***  No chief complaint on file.   Dyspnea:  *** Cough: *** Sleeping: *** SABA use: *** 02: *** Covid status: *** Lung cancer screening: ***   No obvious day to day or daytime variability or assoc excess/ purulent sputum or mucus plugs or hemoptysis or cp or chest tightness, subjective wheeze or overt sinus or hb symptoms.   *** without nocturnal  or early am exacerbation  of respiratory  c/o's or need for noct saba. Also denies any obvious fluctuation of symptoms with weather or environmental changes or other aggravating or alleviating factors except as outlined above   No unusual exposure hx or h/o childhood pna/ asthma or knowledge of premature birth.  Current Allergies, Complete Past Medical History, Past Surgical History, Family  History, and Social History were reviewed in [employer] record.  ROS  The following are not active complaints unless bolded Hoarseness, sore throat, dysphagia, dental problems, itching, sneezing,  nasal congestion or discharge of excess mucus or purulent secretions, ear ache,   fever, chills, sweats, unintended wt loss or wt gain, classically pleuritic or exertional cp,  orthopnea pnd or arm/hand swelling  or leg swelling, presyncope, palpitations, abdominal pain, anorexia, nausea, vomiting, diarrhea  or change in bowel habits or change in bladder habits, change in stools or change in urine, dysuria, hematuria,  rash, arthralgias, visual complaints, headache, numbness, weakness or ataxia or problems with walking or coordination,  change in mood or  memory.        No outpatient medications have been marked as taking for the 10/22/22  encounter (Appointment) with [name], MD.               Objective:   Physical Exam  Wt  10/22/2022          ***  08/31/2022        167   04/13/2022      167  01/19/2022          172  12/04/2021        170  09/05/2021        174  03/16/2014        174     03/09/13 179 lb (81.194 kg)  02/27/13 177 lb (80.287 kg)  01/21/13 176 lb (79.833 kg)     Vital signs reviewed  10/22/2022  - Note at rest 02 sats  ***% on ***   General appearance:    ***    Mild bar  2+ ptting LLE/ 1+ R ***               Assessment & Plan:

## 2022-10-22 ENCOUNTER — Encounter: Payer: Self-pay | Admitting: Internal Medicine

## 2022-10-22 ENCOUNTER — Ambulatory Visit (INDEPENDENT_AMBULATORY_CARE_PROVIDER_SITE_OTHER): Payer: Medicare HMO | Admitting: Internal Medicine

## 2022-10-22 VITALS — BP 124/70 | HR 82 | Temp 98.2°F | Ht 66.0 in | Wt 167.0 lb

## 2022-10-22 DIAGNOSIS — M7989 Other specified soft tissue disorders: Secondary | ICD-10-CM

## 2022-10-22 DIAGNOSIS — J449 Chronic obstructive pulmonary disease, unspecified: Secondary | ICD-10-CM | POA: Diagnosis not present

## 2022-10-22 NOTE — Patient Instructions (Addendum)
Pantoprazole (protonix) 40 mg   Take  30-60 min before first meal of the day and Pepcid (famotidine)  20 mg after supper (or bedtime)  until return to office - this is the best way to tell whether stomach acid is contributing to your problem.    GERD (REFLUX)  is an extremely common cause of respiratory symptoms just like yours , many times with no obvious heartburn at all.    It can be treated with medication, but also with lifestyle changes including elevation of the head of your bed (ideally with 6-8inch blocks under the headboard of your bed),  Smoking cessation, avoidance of late meals, excessive alcohol, and avoid fatty foods, chocolate, peppermint, colas, red wine, and acidic juices such as orange juice.  NO MINT OR MENTHOL PRODUCTS SO NO COUGH DROPS  USE SUGARLESS CANDY INSTEAD (Jolley ranchers or Stover's or Life Savers) or even ice chips will also do - the key is to swallow to prevent all throat clearing. NO OIL BASED VITAMINS - use powdered substitutes.  Avoid fish oil when coughing.    Please schedule a follow up visit in 4months but call sooner if needed for ENT referral  - bring inhalers

## 2022-10-22 NOTE — Assessment & Plan Note (Addendum)
Quit smoking 1991/ MM  - PFT's 2014  FEV1 1.10 (44%) ratio 54 and 41% reversibilty  DLCO 69 corrects to 99% and 41% improvement p saba  - 09/05/2021  After extensive coaching inhaler device,  effectiveness =  80%  > breztri Take 2 puffs first thing in am and then another 2 puffs about 12 hours later. - PFT's  09/18/21  FEV1 1.49  (65 % ) ratio 0.55  p 25 % improvement from saba p ? prior to study with DLCO  14.68 (68%)   and FV curve mildly concave   - 12/04/2021  After extensive coaching inhaler device,  effectiveness =  80%  - 12/04/2021   Walked on RA  x  3  lap(s) =  approx 450  ft  @ mod pace, stopped due to end of study c/o feeling "wobbly" but no sob  with lowest 02 sats 94 % - Labs ordered 12/04/2021      alpha one AT phenotype   MM  Level 143  = 04/13/22 Eos 0.1  And IgE 175   Breathing worse on symb 80 but cough no better so resumed the breztri for now but may need to chagne back to symb 80 and add spiriva or change to stiolto if any evidence of MAI proression on f/u CT chest planned for 02/2023   In meantime 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   These are the same pts (now being labeled as having "irritable larynx syndrome" by some cough centers) who not infrequently have a history of having failed to tolerate ace inhibitors,  dry powder inhalers or biphosphonates or report having atypical/extraesophageal reflux symptoms that don't respond to standard doses of PPI  and are easily confused as having aecopd or asthma flares by even  experienced allergists/ pulmonologists (myself included).   Rec  Bed blocks Ppi ac q am  H2 at hs   F/u with ent if not improving and here in 02/2023 for f/u CT    Each maintenance medication was reviewed in detail including emphasizing most importantly the difference between maintenance and prns and under what circumstances the prns are to be triggered using an action plan format where appropriate.  Total time for H and P, chart review, counseling, reviewing hfa/ neb  device(s) and   refractory respiratory  symptoms of uncertain etiology

## 2022-10-23 ENCOUNTER — Other Ambulatory Visit: Payer: Self-pay | Admitting: *Deleted

## 2022-10-31 DIAGNOSIS — C44311 Basal cell carcinoma of skin of nose: Secondary | ICD-10-CM | POA: Diagnosis not present

## 2022-10-31 DIAGNOSIS — L570 Actinic keratosis: Secondary | ICD-10-CM | POA: Diagnosis not present

## 2022-10-31 DIAGNOSIS — D485 Neoplasm of uncertain behavior of skin: Secondary | ICD-10-CM | POA: Diagnosis not present

## 2022-11-05 ENCOUNTER — Other Ambulatory Visit: Payer: Self-pay | Admitting: Pharmacist Clinician (PhC)/ Clinical Pharmacy Specialist

## 2022-11-05 DIAGNOSIS — E782 Mixed hyperlipidemia: Secondary | ICD-10-CM

## 2022-11-05 MED ORDER — REPATHA SURECLICK 140 MG/ML ~~LOC~~ SOAJ
140.0000 mg | SUBCUTANEOUS | 3 refills | Status: DC
Start: 2022-11-05 — End: 2023-07-22

## 2022-11-08 DIAGNOSIS — C44311 Basal cell carcinoma of skin of nose: Secondary | ICD-10-CM | POA: Diagnosis not present

## 2022-11-15 ENCOUNTER — Ambulatory Visit: Payer: Medicare HMO | Attending: Cardiology | Admitting: *Deleted

## 2022-11-15 DIAGNOSIS — I4891 Unspecified atrial fibrillation: Secondary | ICD-10-CM

## 2022-11-15 DIAGNOSIS — Z5181 Encounter for therapeutic drug level monitoring: Secondary | ICD-10-CM | POA: Diagnosis not present

## 2022-11-15 LAB — POCT INR: POC INR: 2.8

## 2022-11-15 NOTE — Patient Instructions (Signed)
Description   Continue warfarin 1 tablet daily except 1/2 tablet on Sundays and Wednesdays Recheck in 6 wks

## 2022-12-17 ENCOUNTER — Other Ambulatory Visit: Payer: Self-pay

## 2022-12-17 ENCOUNTER — Emergency Department (HOSPITAL_COMMUNITY): Payer: Medicare HMO

## 2022-12-17 ENCOUNTER — Encounter (HOSPITAL_COMMUNITY): Payer: Self-pay | Admitting: Emergency Medicine

## 2022-12-17 ENCOUNTER — Emergency Department (HOSPITAL_COMMUNITY)
Admission: EM | Admit: 2022-12-17 | Discharge: 2022-12-17 | Disposition: A | Discharge status: Home / Self Care | Payer: Medicare HMO | Attending: Emergency Medicine | Admitting: Emergency Medicine

## 2022-12-17 DIAGNOSIS — W19XXXA Unspecified fall, initial encounter: Secondary | ICD-10-CM | POA: Insufficient documentation

## 2022-12-17 DIAGNOSIS — M545 Low back pain, unspecified: Secondary | ICD-10-CM | POA: Diagnosis not present

## 2022-12-17 DIAGNOSIS — Z043 Encounter for examination and observation following other accident: Secondary | ICD-10-CM | POA: Diagnosis not present

## 2022-12-17 DIAGNOSIS — R911 Solitary pulmonary nodule: Secondary | ICD-10-CM | POA: Diagnosis not present

## 2022-12-17 DIAGNOSIS — I4891 Unspecified atrial fibrillation: Secondary | ICD-10-CM | POA: Insufficient documentation

## 2022-12-17 DIAGNOSIS — M25551 Pain in right hip: Secondary | ICD-10-CM | POA: Diagnosis not present

## 2022-12-17 DIAGNOSIS — M160 Bilateral primary osteoarthritis of hip: Secondary | ICD-10-CM | POA: Diagnosis not present

## 2022-12-17 DIAGNOSIS — Z7901 Long term (current) use of anticoagulants: Secondary | ICD-10-CM | POA: Insufficient documentation

## 2022-12-17 DIAGNOSIS — S299XXA Unspecified injury of thorax, initial encounter: Secondary | ICD-10-CM | POA: Diagnosis not present

## 2022-12-17 DIAGNOSIS — S32591A Other specified fracture of right pubis, initial encounter for closed fracture: Secondary | ICD-10-CM | POA: Diagnosis not present

## 2022-12-17 DIAGNOSIS — I700 Atherosclerosis of aorta: Secondary | ICD-10-CM | POA: Diagnosis not present

## 2022-12-17 DIAGNOSIS — I6782 Cerebral ischemia: Secondary | ICD-10-CM | POA: Diagnosis not present

## 2022-12-17 DIAGNOSIS — S0990XA Unspecified injury of head, initial encounter: Secondary | ICD-10-CM | POA: Insufficient documentation

## 2022-12-17 DIAGNOSIS — S270XXA Traumatic pneumothorax, initial encounter: Secondary | ICD-10-CM | POA: Diagnosis not present

## 2022-12-17 LAB — CBC
HCT: 46.5 % (ref 39.0–52.0)
Hemoglobin: 15 g/dL (ref 13.0–17.0)
MCH: 29.5 pg (ref 26.0–34.0)
MCHC: 32.3 g/dL (ref 30.0–36.0)
MCV: 91.5 fL (ref 80.0–100.0)
Platelets: 290 10*3/uL (ref 150–400)
RBC: 5.08 MIL/uL (ref 4.22–5.81)
RDW: 13.2 % (ref 11.5–15.5)
WBC: 8.7 10*3/uL (ref 4.0–10.5)
nRBC: 0 % (ref 0.0–0.2)

## 2022-12-17 LAB — COMPREHENSIVE METABOLIC PANEL
ALT: 14 U/L (ref 0–44)
AST: 19 U/L (ref 15–41)
Albumin: 3.8 g/dL (ref 3.5–5.0)
Alkaline Phosphatase: 68 U/L (ref 38–126)
Anion gap: 7 (ref 5–15)
BUN: 21 mg/dL (ref 8–23)
CO2: 28 mmol/L (ref 22–32)
Calcium: 9 mg/dL (ref 8.9–10.3)
Chloride: 104 mmol/L (ref 98–111)
Creatinine, Ser: 1.09 mg/dL (ref 0.61–1.24)
GFR, Estimated: >60 mL/min (ref >60)
Glucose, Bld: 114 mg/dL — ABNORMAL HIGH (ref 70–99)
Potassium: 4.4 mmol/L (ref 3.5–5.1)
Sodium: 139 mmol/L (ref 135–145)
Total Bilirubin: 0.6 mg/dL (ref 0.3–1.2)
Total Protein: 7.3 g/dL (ref 6.5–8.1)

## 2022-12-17 LAB — TYPE AND SCREEN
ABO/RH(D): A NEG
Antibody Screen: NEGATIVE

## 2022-12-17 NOTE — ED Provider Notes (Signed)
EMERGENCY DEPARTMENT AT [facility] Provider Note   CSN: [id number] Arrival date & time: 12/17/22  1202     History  Chief Complaint  Patient presents with   [name] is a 82 y.o. male presents for concern of an imaging finding on his chest x-ray.  He states that in the left lower lobe his primary care provider pointed out a spot of concern.  He denies any shortness of breath, cough.  He also notes he had a mechanical fall 6/21 where he hit his head.  He is on Coumadin.  He denies any palpitations, changes in vision, vomiting, dizziness, headache.  He has had some generalized body soreness and right hip pain.   Fall Pertinent negatives include no shortness of breath.       Home Medications Prior to Admission medications   Medication Sig Start Date End Date Taking? Authorizing Provider  albuterol (PROVENTIL) (2.5 MG/3ML) 0.083% nebulizer solution USE 1 VIAL IN NEBULIZER EVERY 6 HOURS AS NEEDED FOR WHEEZING FOR SHORTNESS OF BREATH 07/05/22   Sonny Masters, FNP  albuterol (VENTOLIN HFA) 108 (90 Base) MCG/ACT inhaler Inhale 2 puffs into the lungs every 6 (six) hours as needed for wheezing or shortness of breath. 07/05/22   Sonny Masters, FNP  Budeson-Glycopyrrol-Formoterol (BREZTRI AEROSPHERE) 160-9-4.8 MCG/ACT AERO Inhale into the lungs.    [provider]  Evolocumab (REPATHA SURECLICK) 140 MG/ML SOAJ Inject 140 mg into the skin every 14 (fourteen) days. 11/05/22   Antoine Poche, MD  famotidine (PEPCID) 20 MG tablet TAKE 1 TABLET EVERY DAY AFTER SUPPER 10/04/22   [name], MD  furosemide (LASIX) 20 MG tablet TAKE 1 TABLET DAILY AS NEEDED FOR EDEMA (SWELLING). 08/15/22   Sharlene Dory, NP  ketorolac (ACULAR) 0.5 % ophthalmic solution PLACE 1 DROP INTO THE LEFT EYE 4 TIMES DAILY. 03/23/22   Daryll Drown, NP  metoprolol tartrate (LOPRESSOR) 25 MG tablet Take 1 tablet (25 mg total) by mouth 2 (two) times daily. 09/03/22   Antoine Poche, MD  pantoprazole (PROTONIX) 40 MG tablet TAKE 1 TABLET BY MOUTH ONCE DAILY 30-60  MINUTES  BEFORE  FIRST  MEAL  OF  THE  DAY 09/05/22   [name], MD  traZODone (DESYREL) 50 MG tablet TAKE 1 TABLET AT BEDTIME AS NEEDED FOR SLEEP 07/19/22   Sonny Masters, FNP  warfarin (COUMADIN) 5 MG tablet TAKE 1/2 TO 1 TABLET BY MOUTH ONCE DAILY AS DIRECTED BY COUMADIN CLINIC 09/05/22   Antoine Poche, MD      Allergies    Asa [aspirin], Niacin, Pravastatin, Rosuvastatin, and Simvastatin    Review of Systems   Review of Systems  HENT:         No headache, changes in vision  Respiratory:  Negative for cough and shortness of breath.   Gastrointestinal:  Negative for vomiting.    Physical Exam Updated Vital Signs BP (!) 178/69   Pulse 69   Temp 97.9 F (36.6 C) (Oral)   Resp 19   Ht 5\' 6"  (1.676 m)   Wt 77.1 kg   SpO2 97%   BMI 27.44 kg/m  Physical Exam Vitals and nursing note reviewed.  Constitutional:      General: He is not in acute distress.    Appearance: Normal appearance.     Comments: Normal gait  HENT:     Head: Normocephalic and atraumatic.  Eyes:  Extraocular Movements: Extraocular movements intact.     Pupils: Pupils are equal, round, and reactive to light.  Cardiovascular:     Rate and Rhythm: Normal rate and regular rhythm.  Pulmonary:     Effort: Pulmonary effort is normal.     Breath sounds: Normal breath sounds.  Skin:    Comments: Abrasion to the right forearm, scabbing over  Neurological:     General: No focal deficit present.     Mental Status: He is alert.     Cranial Nerves: No cranial nerve deficit.     Motor: No weakness.     Comments: Normal gait and able to perform rapid alternating hand movements and finger-nose testing  Psychiatric:        Mood and Affect: Mood normal.        Behavior: Behavior normal.     ED Results / Procedures / Treatments   Labs (all labs ordered are listed, but only abnormal results are displayed) Labs  Reviewed  COMPREHENSIVE METABOLIC PANEL - Abnormal; Notable for the following components:      Result Value   Glucose, Bld 114 (*)    All other components within normal limits  CBC  TYPE AND SCREEN    EKG None  Radiology DG Chest 2 View  Result Date: 12/17/2022 CLINICAL DATA:  Multiple recent falls. EXAM: CHEST - 2 VIEW COMPARISON:  CT 08/13/2022. PET-CT 09/06/2022. Radiographs 03/16/2014. 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 Grossly stable linear opacity and volume loss in the lingula. The known part solid nodule in the lingula is not well seen radiographically. No evidence of superimposed airspace disease, edema, pleural effusion or pneumothorax. No acute fractures are identified. There are mild degenerative changes in the spine. IMPRESSION: No evidence of acute chest injury. Stable lingular scarring. The known part solid nodule in the lingula is not well seen radiographically. Electronically Signed   By: Carey Bullocks M.D.   On: 12/17/2022 14:08   DG Hip Unilat  With Pelvis 2-3 Views Right  Result Date: 12/17/2022 CLINICAL DATA:  Multiple falls over the last 10 days. Pain to right lower quadrant during ambulation. EXAM: DG HIP (WITH OR WITHOUT PELVIS) 2-3V RIGHT COMPARISON:  PET-CT 09/06/2022 FINDINGS: The bones appear adequately mineralized. No evidence of acute fracture, dislocation or femoral head osteonecrosis. No aggressive osseous lesions are identified. Mild symmetric degenerative changes in the hips and sacroiliac joints. Scattered vascular calcifications are noted. The soft tissues otherwise appear unremarkable. IMPRESSION: No evidence of acute right hip fracture or dislocation. Mild degenerative changes. Electronically Signed   By: Carey Bullocks M.D.   On: 12/17/2022 14:06    Procedures Procedures    Medications Ordered in ED Medications - No data to display  ED Course/ Medical Decision Making/ A&P                              Medical Decision Making Amount and/or Complexity of Data Reviewed Labs: ordered. Radiology: ordered.   82 y.o. male with pertinent past medical history of A-fib on Coumadin presents to the ED for concern of finding on chest x-ray   Differential diagnosis includes but is not limited to mechanical fall, head bleed  ED Course:  Overall well-appearing, he states his primary concern is this chest x-ray finding and not the fall.  Normal neurologic exam, given fall was over a week ago little concern of brain bleed however he is  on Coumadin and did hit his head.  Will obtain CT head.  Discussed chest x-ray with Dr. Wallace Cullens and the change in the lingula seems chronic and does not need any specific follow up. Discussed this with patient.  CT Head without any concerns for bleed, CXR and x ray right hip with no concern for fracture   Impression: Mechanical fall on 6/21 where patient hit his head, on coumadin    Disposition:  The patient was discharged home with instructions to follow-up with his primary care provider if he continues to have concerns about his chest x-ray.  He should follow-up with his PCP if he starts to develop cough, shortness of breath.  Return precautions given   Lab Tests: None indicated  Imaging Studies ordered: I ordered imaging studies including CT head  I independently visualized the imaging with scope of interpretation limited to determining acute life threatening conditions related to emergency care. Imaging showed no acute brain bleed I agree with the radiologist interpretation   Cardiac Monitoring: / EKG: Not indicated  Consultations Obtained: Not indicated   Co morbidities that complicate the patient evaluation  A-fib on warfarin  Social Determinants of Health:  unknown              Final Clinical Impression(s) / ED Diagnoses Final diagnoses:  Fall, initial encounter    Rx / DC Orders ED Discharge Orders     None          Arabella Merles, PA-C 12/17/22 1535    Tanda Rockers A, DO 12/20/22 [phone]

## 2022-12-17 NOTE — ED Triage Notes (Addendum)
Pt via POV after multiple falls in the past week and a half. He reports pain to RLQ during ambulation since his most recent fall on Sunday 6/22. Denies n/v/d/constipation, no abnormal bleeding noted. Pt is ambulatory but is in obvious discomfort while walking to triage; refused wheelchair in lobby. A/O x 4. Sent from PCP this morning after x rays show probably right inferior pubic rami fracture and probable left pneumothorax. RN and triage tech placed pt in a wheelchair following triage assessment. And advised him to avoid ambulation until assessed by a provider.

## 2022-12-17 NOTE — Discharge Instructions (Signed)
You do not need any follow-up imaging concerning your chest x-ray.  Please follow-up with your PCP regarding any concerns.  If you develop any shortness of breath, cough follow-up with your PCP.  Your head CT showed no signs of an acute bleed.   Please return to the ER should you develop any changes in vision, vomiting, severe headache, any other acute concerns.

## 2022-12-21 ENCOUNTER — Telehealth: Payer: Self-pay

## 2022-12-21 ENCOUNTER — Ambulatory Visit: Payer: Self-pay

## 2022-12-21 NOTE — Transitions of Care (Post Inpatient/ED Visit) (Signed)
12/21/2022  Name: [name] MRN: [id number] DOB: 07-Jul-1940  Today's TOC FU Call Status: Today's TOC FU Call Status:: Successful TOC FU Call Competed TOC FU Call Complete Date: 12/21/22  Red on EMMI-ED Discharge Alert Date & Reason:12/19/22 "Scheduled follow-up appt? No"   Transition Care Management Follow-up Telephone Call Date of Discharge: 12/17/22 [facility] Type of Discharge: Emergency Department Reason for ED Visit: Other: ("fall,initial encounter") How have you been since you were released from the hospital?: Better (Pt states he is doing better. No further falls-being cautious. He was pleased to find out no issues with his lungs noted on tests in ED.) Any questions or concerns?: No  Items Reviewed: Did you receive and understand the discharge instructions provided?: Yes Medications obtained,verified, and reconciled?: Yes (Medications Reviewed) Any new allergies since your discharge?: No Dietary orders reviewed?: Yes Type of Diet Ordered:: low salt/heart healthy Do you have support at home?: Yes People in Home: spouse, child(ren), adult Name of Support/Comfort Primary Source: lives with wife and son  Medications Reviewed Today: Medications Reviewed Today     Reviewed by Charlyn Minerva, RN (Registered Nurse) on 12/21/22 at 1559  Med List Status: <None>   Medication Order Taking? Sig Documenting Provider Last Dose Status Informant  albuterol (PROVENTIL) (2.5 MG/3ML) 0.083% nebulizer solution [id number] Yes USE 1 VIAL IN NEBULIZER EVERY 6 HOURS AS NEEDED FOR WHEEZING FOR SHORTNESS OF BREATH Rakes, Doralee Albino, FNP Taking Active   albuterol (VENTOLIN HFA) 108 (90 Base) MCG/ACT inhaler [id number] Yes Inhale 2 puffs into the lungs every 6 (six) hours as needed for wheezing or shortness of breath. Sonny Masters, FNP Taking Active   Budeson-Glycopyrrol-Formoterol (BREZTRI AEROSPHERE) 160-9-4.8 MCG/ACT Sandrea Matte [id number] Yes Inhale into the lungs.  [provider] Taking Active   Evolocumab [facility] SURECLICK) 140 MG/ML Ivory Broad [id number] Yes Inject 140 mg into the skin every 14 (fourteen) days. Antoine Poche, MD Taking Active   famotidine (PEPCID) 20 MG tablet [id number] No TAKE 1 TABLET EVERY DAY AFTER SUPPER  Patient not taking: Reported on 12/21/2022   [name], MD Not Taking Active   furosemide (LASIX) 20 MG tablet [id number] Yes TAKE 1 TABLET DAILY AS NEEDED FOR EDEMA (SWELLING). Sharlene Dory, NP Taking Active   ketorolac (ACULAR) 0.5 % ophthalmic solution [id number] Yes PLACE 1 DROP INTO THE LEFT EYE 4 TIMES DAILY. Daryll Drown, NP Taking Active   metoprolol tartrate (LOPRESSOR) 25 MG tablet [id number] Yes Take 1 tablet (25 mg total) by mouth 2 (two) times daily. Antoine Poche, MD Taking Active   pantoprazole (PROTONIX) 40 MG tablet [id number] Yes TAKE 1 TABLET BY MOUTH ONCE DAILY 30-60  MINUTES  BEFORE  FIRST  MEAL  OF  THE  DAY [name], MD Taking Active   traZODone (DESYREL) 50 MG tablet [id number] No TAKE 1 TABLET AT BEDTIME AS NEEDED FOR SLEEP  Patient not taking: Reported on 12/21/2022   Sonny Masters, FNP Not Taking Active   warfarin (COUMADIN) 5 MG tablet [id number] Yes TAKE 1/2 TO 1 TABLET BY MOUTH ONCE DAILY AS DIRECTED BY COUMADIN CLINIC Branch, Dorothe Pea, MD Taking Active   Med List Note Gwenlyn Fudge, [place] 09/19/21 1028): Prescription assistance for Repatha.            Home Care and Equipment/Supplies: Were Home Health Services Ordered?: NA Any new equipment or medical supplies ordered?: NA  Functional Questionnaire: Do you need assistance with bathing/showering or dressing?: No Do you  need assistance with meal preparation?: No Do you need assistance with eating?: No Do you have difficulty maintaining continence: No Do you need assistance with getting out of bed/getting out of a chair/moving?: No Do you have difficulty managing or taking your medications?: No  Follow up  appointments reviewed: PCP Follow-up appointment confirmed?: Yes Date of PCP follow-up appointment?: 12/28/22 Follow-up Provider: Littie Deeds Specialist [facility] Follow-up appointment confirmed?: Yes Date of Specialist follow-up appointment?: 02/25/23 Follow-Up Specialty Provider:: Dr. Sherene Sires Do you need transportation to your follow-up appointment?: No (pt confirms he is able to drive himself to appts) Do you understand care options if your condition(s) worsen?: Yes-patient verbalized understanding  SDOH Interventions Today    Flowsheet Row Most Recent Value  SDOH Interventions   Food Insecurity Interventions Intervention Not Indicated  Transportation Interventions Intervention Not Indicated      TOC Interventions Today    Flowsheet Row Most Recent Value  TOC Interventions   TOC Interventions Discussed/Reviewed TOC Interventions Discussed, Arranged PCP follow up less than 12 days/Care Guide scheduled      Interventions Today    Flowsheet Row Most Recent Value  General Interventions   General Interventions Discussed/Reviewed General Interventions Discussed, Durable Medical Equipment (DME)  Durable Medical Equipment (DME) BP Cuff  [pt has BP machine-states he has not checked BP today-but BP normally within normal limits]  Education Interventions   Education Provided Provided Education  Provided Verbal Education On When to see the doctor, Nutrition, Medication  Nutrition Interventions   Nutrition Discussed/Reviewed Nutrition Discussed, Fluid intake, Decreasing salt, Adding fruits and vegetables, Increasing proteins, Decreasing fats  Pharmacy Interventions   Pharmacy Dicussed/Reviewed Pharmacy Topics Discussed, Medications and their functions  Safety Interventions   Safety Discussed/Reviewed Safety Discussed, Home Safety  Home Safety Assistive Devices  [reviewed fall/safety measures-pt has walker-uses it prn]       Antionette Fairy, RN,BSN,CCM [facility] Health/[facility] Care  Management Care Management Community Coordinator Direct Phone: [phone] Toll Free: [phone] Fax: [phone]

## 2022-12-21 NOTE — Chronic Care Management (AMB) (Signed)
   12/21/2022  [name] [birth year] [id number]   Reason for Encounter Patient is not currently enrolled in the  CCM program. CCM enrollment status changed to "Previously enrolled"   [facility] Health/Chronic Care Management [phone]

## 2022-12-27 ENCOUNTER — Ambulatory Visit: Payer: Medicare HMO | Attending: Cardiology | Admitting: *Deleted

## 2022-12-27 DIAGNOSIS — I4891 Unspecified atrial fibrillation: Secondary | ICD-10-CM | POA: Diagnosis not present

## 2022-12-27 DIAGNOSIS — Z5181 Encounter for therapeutic drug level monitoring: Secondary | ICD-10-CM | POA: Diagnosis not present

## 2022-12-27 LAB — POCT INR: INR: 2.4 (ref 2.0–3.0)

## 2022-12-27 NOTE — Patient Instructions (Signed)
Continue warfarin 1 tablet daily except 1/2 tablet on Sundays and Wednesdays Recheck in 6 wks 

## 2022-12-28 ENCOUNTER — Ambulatory Visit (INDEPENDENT_AMBULATORY_CARE_PROVIDER_SITE_OTHER): Payer: Medicare HMO | Admitting: Family Medicine

## 2022-12-28 ENCOUNTER — Encounter: Payer: Self-pay | Admitting: Family Medicine

## 2022-12-28 VITALS — BP 145/72 | HR 67 | Temp 97.0°F | Ht 66.0 in | Wt 162.2 lb

## 2022-12-28 DIAGNOSIS — W19XXXD Unspecified fall, subsequent encounter: Secondary | ICD-10-CM

## 2022-12-28 DIAGNOSIS — W19XXXA Unspecified fall, initial encounter: Secondary | ICD-10-CM | POA: Diagnosis not present

## 2022-12-28 DIAGNOSIS — R6889 Other general symptoms and signs: Secondary | ICD-10-CM | POA: Diagnosis not present

## 2022-12-28 DIAGNOSIS — R5383 Other fatigue: Secondary | ICD-10-CM | POA: Diagnosis not present

## 2022-12-28 DIAGNOSIS — R5381 Other malaise: Secondary | ICD-10-CM

## 2022-12-28 DIAGNOSIS — E559 Vitamin D deficiency, unspecified: Secondary | ICD-10-CM | POA: Diagnosis not present

## 2022-12-28 LAB — CMP14+EGFR

## 2022-12-28 LAB — ANEMIA PROFILE B
Eos: 1 %
Hematocrit: 45.8 % (ref 37.5–51.0)
Immature Grans (Abs): 0 10*3/uL (ref 0.0–0.1)
Immature Granulocytes: 0 %
Lymphocytes Absolute: 1.8 10*3/uL (ref 0.7–3.1)
MCHC: 32.8 g/dL (ref 31.5–35.7)
Monocytes Absolute: 0.6 10*3/uL (ref 0.1–0.9)
Neutrophils Absolute: 5.7 10*3/uL (ref 1.4–7.0)
Retic Ct Pct: 0.9 % (ref 0.6–2.6)
WBC: 8.3 10*3/uL (ref 3.4–10.8)

## 2022-12-28 LAB — THYROID PANEL WITH TSH

## 2022-12-28 LAB — VITAMIN D 25 HYDROXY (VIT D DEFICIENCY, FRACTURES)

## 2022-12-28 NOTE — Progress Notes (Signed)
Subjective:  Patient ID: [name], male    DOB: 24-Oct-1940, 82 y.o.   MRN: [id number]  Patient Care Team: Sonny Masters, FNP as PCP - General (Family Medicine) Wyline Mood Dorothe Pea, MD as PCP - Cardiology (Cardiology) Wyline Mood Dorothe Pea, MD as Consulting Physician (Cardiology) Bjorn Pippin, MD as Attending Physician (Urology) Danella Maiers, [facility] (Pharmacist)   Chief Complaint:  Hospitalization Follow-up (Fall)   HPI: [name] is a 82 y.o. male presenting on 12/28/2022 for Hospitalization Follow-up (Fall)   Pt presents today for ED follow up. He had a fall and went to the ED several days after the fall. Head scan was unremarkable. Workup was unremarkable and pt was discharged home. No recurrent falls since being home. He reports ongoing malaise and fatigue for several months. States he gets tired easily. He denies chest pain, worsening lower extremity swelling, or shortness of breath. No weakness or confusion. No syncope.     Relevant past medical, surgical, family, and social history reviewed and updated as indicated.  Allergies and medications reviewed and updated. Data reviewed: Chart in Epic.   Past Medical History:  Diagnosis Date   Anxiety disorder    Benign prostatic hypertrophy    Bilateral cataracts    CAD (coronary artery disease)    Catheterization 2009, mild CAD   Carotid artery disease (HCC)    Doppler, September, 2011, less than 50% bilateral   COPD (chronic obstructive pulmonary disease) (HCC)    CVA (cerebral infarction)    Hospitalization.  MRI...    Left anterior cerebral artery territory, September, 2011... Coumadin started   DYSLIPIDEMIA 11/01/2009   Qualifier: Diagnosis of  By: Myrtis Ser, MD, [facility], Lemmie Evens    Ejection fraction    EF 60%   Ejection fraction    EF 55-60%, echo, September, 2011   Gastroesophageal reflux disease    History of deep venous thrombosis (DVT) of distal vein of left lower extremity 08/05/2020   History of left  inguinal hernia    Hyperlipidemia    Persistent atrial fibrillation (HCC)        Ptosis of eyelid, left    Surgery in the past   Sweating abnormality    Episodes of sweating appeared to be related to a delayed reaction to niacin   Vitamin D insufficiency     Past Surgical History:  Procedure Laterality Date   APPENDECTOMY     CATARACT EXTRACTION, BILATERAL     INGUINAL HERNIA REPAIR Left 07/02/2019   INGUINAL HERNIA REPAIR Left 07/01/2020   Procedure: LEFT INGUINAL HERNIA REPAIR WITH MESH;  Surgeon: Lucretia Roers, MD;  Location: [facility];  Service: General;  Laterality: Left;   KNEE SURGERY Right 2019   torn ligament?    TRANSURETHRAL RESECTION OF PROSTATE N/A 08/19/2018   Procedure: TRANSURETHRAL RESECTION OF THE PROSTATE (TURP) OF PROSTATE ABCESS;  Surgeon: Sebastian Ache, MD;  Location: [facility];  Service: Urology;  Laterality: N/A;    Social History   Socioeconomic History   Marital status: Married    Spouse name: Chrstopher Dowtin   Number of children: 5   Years of education: 11   Highest education level: 11th grade  Occupational History   Occupation: Retired Biochemist, clinical   Tobacco Use   Smoking status: Former    Current packs/day: 0.00    Average packs/day: 3.0 packs/day for 50.0 years (150.0 ttl pk-yrs)    Types: Cigarettes    Start date: 03/18/1940    Quit  date: 03/18/1990    Years since quitting: 32.8    Passive exposure: Past   Smokeless tobacco: Former    Types: Chew    Quit date: 06/18/2000  Vaping Use   Vaping status: Never Used  Substance and Sexual Activity   Alcohol use: No    Alcohol/week: 0.0 standard drinks of alcohol   Drug use: No   Sexual activity: Not Currently    Partners: Female  Other Topics Concern   Not on file  Social History Narrative   Quit smoking in 1993. No significant alcohol abuse. Retired from trucking.    Social Determinants of Health   Financial Resource Strain: Low Risk  (03/27/2022)   Overall Financial Resource Strain  (CARDIA)    Difficulty of Paying Living Expenses: Not hard at all  Food Insecurity: No Food Insecurity (12/21/2022)   Hunger Vital Sign    Worried About Running Out of Food in the Last Year: Never true    Ran Out of Food in the Last Year: Never true  Transportation Needs: No Transportation Needs (12/21/2022)   PRAPARE - Administrator, Civil Service (Medical): No    Lack of Transportation (Non-Medical): No  Physical Activity: Sufficiently Active (03/27/2022)   Exercise Vital Sign    Days of Exercise per Week: 5 days    Minutes of Exercise per Session: 30 min  Stress: No Stress Concern Present (03/27/2022)   [employer] of Occupational Health - Occupational Stress Questionnaire    Feeling of Stress : Not at all  Social Connections: Socially Integrated (03/27/2022)   Social Connection and Isolation Panel [NHANES]    Frequency of Communication with Friends and Family: More than three times a week    Frequency of Social Gatherings with Friends and Family: More than three times a week    Attends Religious Services: More than 4 times per year    Active Member of [employer] or Organizations: Yes    Attends Engineer, structural: More than 4 times per year    Marital Status: Married  Catering manager Violence: Not At Risk (03/27/2022)   Humiliation, Afraid, Rape, and Kick questionnaire    Fear of Current or Ex-Partner: No    Emotionally Abused: No    Physically Abused: No    Sexually Abused: No    Outpatient Encounter Medications as of 12/28/2022  Medication Sig   albuterol (PROVENTIL) (2.5 MG/3ML) 0.083% nebulizer solution USE 1 VIAL IN NEBULIZER EVERY 6 HOURS AS NEEDED FOR WHEEZING FOR SHORTNESS OF BREATH   albuterol (VENTOLIN HFA) 108 (90 Base) MCG/ACT inhaler Inhale 2 puffs into the lungs every 6 (six) hours as needed for wheezing or shortness of breath.   Budeson-Glycopyrrol-Formoterol (BREZTRI AEROSPHERE) 160-9-4.8 MCG/ACT AERO Inhale into the lungs.   Evolocumab  (REPATHA SURECLICK) 140 MG/ML SOAJ Inject 140 mg into the skin every 14 (fourteen) days.   furosemide (LASIX) 20 MG tablet TAKE 1 TABLET DAILY AS NEEDED FOR EDEMA (SWELLING).   ketorolac (ACULAR) 0.5 % ophthalmic solution PLACE 1 DROP INTO THE LEFT EYE 4 TIMES DAILY.   metoprolol tartrate (LOPRESSOR) 25 MG tablet Take 1 tablet (25 mg total) by mouth 2 (two) times daily.   pantoprazole (PROTONIX) 40 MG tablet TAKE 1 TABLET BY MOUTH ONCE DAILY 30-60  MINUTES  BEFORE  FIRST  MEAL  OF  THE  DAY   traZODone (DESYREL) 50 MG tablet TAKE 1 TABLET AT BEDTIME AS NEEDED FOR SLEEP   warfarin (COUMADIN) 5 MG tablet TAKE 1/2  TO 1 TABLET BY MOUTH ONCE DAILY AS DIRECTED BY COUMADIN CLINIC   [DISCONTINUED] famotidine (PEPCID) 20 MG tablet TAKE 1 TABLET EVERY DAY AFTER SUPPER (Patient not taking: Reported on 12/28/2022)   No facility-administered encounter medications on file as of 12/28/2022.    Allergies  Allergen Reactions   Asa [Aspirin]     GI upset   Niacin     hot flashes   Pravastatin     myalgias   Rosuvastatin     myalgias   Simvastatin     Myalgias     Review of Systems  Constitutional:  Positive for activity change, appetite change and fatigue. Negative for chills, diaphoresis, fever and unexpected weight change.  HENT: Negative.    Eyes: Negative.  Negative for photophobia and visual disturbance.  Respiratory:  Negative for cough, chest tightness and shortness of breath.   Cardiovascular:  Negative for chest pain, palpitations and leg swelling.  Gastrointestinal:  Negative for abdominal pain, blood in stool, constipation, diarrhea, nausea and vomiting.  Endocrine: Negative.   Genitourinary:  Negative for decreased urine volume, difficulty urinating, dysuria, frequency and urgency.  Musculoskeletal:  Negative for arthralgias and myalgias.  Skin: Negative.   Allergic/Immunologic: Negative.   Neurological:  Negative for dizziness, tremors, seizures, syncope, facial asymmetry, speech  difficulty, weakness, light-headedness, numbness and headaches.  Hematological: Negative.   Psychiatric/Behavioral:  Negative for confusion, hallucinations, sleep disturbance and suicidal ideas.   All other systems reviewed and are negative.       Objective:  BP (!) 145/72   Pulse 67   Temp (!) 97 F (36.1 C) (Temporal)   Ht 5\' 6"  (1.676 m)   Wt 162 lb 3.2 oz (73.6 kg)   SpO2 98%   BMI 26.18 kg/m    Wt Readings from Last 3 Encounters:  12/28/22 162 lb 3.2 oz (73.6 kg)  12/17/22 170 lb (77.1 kg)  10/22/22 167 lb (75.8 kg)    Physical Exam Vitals and nursing note reviewed.  Constitutional:      General: He is not in acute distress.    Appearance: Normal appearance. He is overweight. He is not ill-appearing, toxic-appearing or diaphoretic.  HENT:     Head: Normocephalic and atraumatic.     Mouth/Throat:     Mouth: Mucous membranes are moist.  Eyes:     Conjunctiva/sclera: Conjunctivae normal.     Pupils: Pupils are equal, round, and reactive to light.  Cardiovascular:     Rate and Rhythm: Normal rate and regular rhythm.     Heart sounds: Normal heart sounds.  Pulmonary:     Effort: Pulmonary effort is normal.     Breath sounds: Normal breath sounds.  Musculoskeletal:     Cervical back: Normal range of motion and neck supple.  Skin:    General: Skin is warm and dry.     Capillary Refill: Capillary refill takes less than 2 seconds.  Neurological:     General: No focal deficit present.     Mental Status: He is alert and oriented to person, place, and time.     Gait: Gait abnormal (slow).  Psychiatric:        Mood and Affect: Mood normal.        Behavior: Behavior normal. Behavior is cooperative.        Thought Content: Thought content normal.        Judgment: Judgment normal.     Results for orders placed or performed in visit on 12/27/22  POCT INR  Result Value Ref Range   INR 2.4 2.0 - 3.0   POC INR         Pertinent labs & imaging results that were  available during my care of the patient were reviewed by me and considered in my medical decision making.  Assessment & Plan:  [name] was seen today for hospitalization follow-up.  Diagnoses and all orders for this visit:  Fall, initial encounter Doing well since discharge home, no recurrent falls. Will repeat labs today.  -     Anemia Profile B -     CMP14+EGFR -     Thyroid Panel With TSH -     VITAMIN D 25 Hydroxy (Vit-D Deficiency, Fractures)  Malaise and fatigue Vitamin D insufficiency  Will check for possible deficiencies and treat if warranted.  -     Anemia Profile B -     CMP14+EGFR -     Thyroid Panel With TSH -     VITAMIN D 25 Hydroxy (Vit-D Deficiency, Fractures)    Continue all other maintenance medications.  Follow up plan: Return in about 3 months (around 03/30/2023), or if symptoms worsen or fail to improve, for chronic follow up.   Continue healthy lifestyle choices, including diet (rich in fruits, vegetables, and lean proteins, and low in salt and simple carbohydrates) and exercise (at least 30 minutes of moderate physical activity daily).  Educational handout given for fall prevention, fatigue  The above assessment and management plan was discussed with the patient. The patient verbalized understanding of and has agreed to the management plan. Patient is aware to call the clinic if they develop any new symptoms or if symptoms persist or worsen. Patient is aware when to return to the clinic for a follow-up visit. Patient educated on when it is appropriate to go to the emergency department.   Kari Baars, FNP-C [facility] [phone]

## 2022-12-29 LAB — THYROID PANEL WITH TSH: TSH: 4.35 u[IU]/mL (ref 0.450–4.500)

## 2022-12-29 LAB — CMP14+EGFR
AST: 19 IU/L (ref 0–40)
Bilirubin Total: 0.6 mg/dL (ref 0.0–1.2)
CO2: 22 mmol/L (ref 20–29)
Chloride: 103 mmol/L (ref 96–106)
Creatinine, Ser: 1.15 mg/dL (ref 0.76–1.27)

## 2022-12-29 LAB — ANEMIA PROFILE B
Basophils Absolute: 0 10*3/uL (ref 0.0–0.2)
Basos: 1 %
EOS (ABSOLUTE): 0.1 10*3/uL (ref 0.0–0.4)
Ferritin: 253 ng/mL (ref 30–400)
Hemoglobin: 15 g/dL (ref 13.0–17.7)
Lymphs: 22 %
MCH: 29.1 pg (ref 26.6–33.0)
MCV: 89 fL (ref 79–97)
Monocytes: 7 %
Neutrophils: 69 %
Platelets: 266 10*3/uL (ref 150–450)
RBC: 5.15 x10E6/uL (ref 4.14–5.80)
RDW: 13 % (ref 11.6–15.4)
Total Iron Binding Capacity: 322 ug/dL (ref 250–450)

## 2023-01-08 DIAGNOSIS — Z85828 Personal history of other malignant neoplasm of skin: Secondary | ICD-10-CM | POA: Diagnosis not present

## 2023-01-08 DIAGNOSIS — Z1283 Encounter for screening for malignant neoplasm of skin: Secondary | ICD-10-CM | POA: Diagnosis not present

## 2023-01-08 DIAGNOSIS — D485 Neoplasm of uncertain behavior of skin: Secondary | ICD-10-CM | POA: Diagnosis not present

## 2023-01-08 DIAGNOSIS — L570 Actinic keratosis: Secondary | ICD-10-CM | POA: Diagnosis not present

## 2023-02-07 ENCOUNTER — Telehealth: Payer: Self-pay | Admitting: Family Medicine

## 2023-02-07 ENCOUNTER — Ambulatory Visit: Payer: Medicare HMO | Attending: Cardiology | Admitting: *Deleted

## 2023-02-07 DIAGNOSIS — I4891 Unspecified atrial fibrillation: Secondary | ICD-10-CM

## 2023-02-07 DIAGNOSIS — Z5181 Encounter for therapeutic drug level monitoring: Secondary | ICD-10-CM | POA: Diagnosis not present

## 2023-02-07 LAB — POCT INR: INR: 1.9 — AB (ref 2.0–3.0)

## 2023-02-07 NOTE — Telephone Encounter (Signed)
  Prescription Request  02/07/2023  Is this a "Controlled Substance" medicine? no  Have you seen your PCP in the last 2 weeks? No pt last seen in July and appt on 04/02/23  If YES, route message to pool  -  If NO, patient needs to be scheduled for appointment.  What is the name of the medication or equipment? Breztri inhaler  Have you contacted your pharmacy to request a refill? yes   Which pharmacy would you like this sent to? [facility] [phone] Fax#[phone] Pt was not able to provide name of pharmacy only this information    Patient notified that their request is being sent to the clinical staff for review and that they should receive a response within 2 business days.

## 2023-02-07 NOTE — Patient Instructions (Signed)
Take warfarin 1 1/2 tablets tonight then resume 1 tablet daily except 1/2 tablet on Sundays and Wednesdays Recheck in 6 wks

## 2023-02-11 MED ORDER — BREZTRI AEROSPHERE 160-9-4.8 MCG/ACT IN AERO: 2.0000 | INHALATION_SPRAY | Freq: Two times a day (BID) | RESPIRATORY_TRACT | 2 refills | Status: DC

## 2023-02-11 NOTE — Telephone Encounter (Signed)
Prescription sent to pharmacy, patient aware 

## 2023-02-20 ENCOUNTER — Telehealth: Payer: Self-pay | Admitting: Internal Medicine

## 2023-02-20 NOTE — Telephone Encounter (Signed)
He should be scheduled for CT super D by end of this month so make sure he keeps appt  F/u nodule

## 2023-02-22 ENCOUNTER — Other Ambulatory Visit (HOSPITAL_COMMUNITY): Payer: Medicare HMO

## 2023-02-25 ENCOUNTER — Other Ambulatory Visit: Payer: Self-pay

## 2023-02-25 ENCOUNTER — Ambulatory Visit: Payer: Medicare HMO | Admitting: Internal Medicine

## 2023-02-25 DIAGNOSIS — R918 Other nonspecific abnormal finding of lung field: Secondary | ICD-10-CM

## 2023-02-25 DIAGNOSIS — J449 Chronic obstructive pulmonary disease, unspecified: Secondary | ICD-10-CM

## 2023-02-25 NOTE — Telephone Encounter (Signed)
Pt needs to schedule for CT scan

## 2023-02-28 NOTE — Telephone Encounter (Signed)
Patient was scheduled for 02/22/23 ct at [facility] and cancelled the ct  I have called the patient and he wants to wait till next month to do the ct I have transferred him over to central scheduling

## 2023-02-28 NOTE — Telephone Encounter (Signed)
Pt has r/s his CT Scan per chart

## 2023-03-04 ENCOUNTER — Ambulatory Visit: Payer: Medicare HMO | Attending: Cardiology | Admitting: Cardiology

## 2023-03-04 ENCOUNTER — Encounter: Payer: Self-pay | Admitting: Cardiology

## 2023-03-04 VITALS — BP 124/70 | HR 70 | Ht 66.0 in | Wt 167.4 lb

## 2023-03-04 DIAGNOSIS — I4891 Unspecified atrial fibrillation: Secondary | ICD-10-CM

## 2023-03-04 DIAGNOSIS — I6523 Occlusion and stenosis of bilateral carotid arteries: Secondary | ICD-10-CM | POA: Diagnosis not present

## 2023-03-04 DIAGNOSIS — E782 Mixed hyperlipidemia: Secondary | ICD-10-CM

## 2023-03-04 DIAGNOSIS — I2510 Atherosclerotic heart disease of native coronary artery without angina pectoris: Secondary | ICD-10-CM | POA: Diagnosis not present

## 2023-03-04 NOTE — Patient Instructions (Signed)
Medication Instructions:  Continue all current medications.  Labwork: none  Testing/Procedures: none  Follow-Up: 6 months   Any Other Special Instructions Will Be Listed Below (If Applicable).  If you need a refill on your cardiac medications before your next appointment, please call your pharmacy.  

## 2023-03-04 NOTE — Progress Notes (Signed)
Clinical Summary Mr. Avram is a 82 y.o.male seen today for follow up of the following medical problems.      1. Chronic afib -  DOACs were too expensive in the past, he has elected to remain on coumadin.  - some fatigue on lopressor 100mg  bid, has been maintained on lower doses.   - seen by EP, did not meet criteria to consider watchman device    - - no recent palpitations - no bleeding on coumadin.    2. Carotid stenosis - 02/2015 carotid US: bilateral 1-39% ICA disease.   - 10/2020 mild bilateral disease   3. Hyperlipidemia - joint pains on simvaststin, atorvastatin, rosuvastatin, pravastatin. - he is on repatha, followed lipid clinic 02/2021 TC 122 TG 145 HDL 34 LDL 63 09/2021 TC 113 TG 228 HDL 36 LDL 41 09/2022 TC 116 TG 98 HDL 38 LDL 59     4. CAD - mild disease by cath 2009 - -no recent chest pains.      5. LE edema - some LE edema at times, has prn lasix. Takes the prn lasix about 3 times a week.      6. Aortic regurgitation - moderate AI by echo Jan 2020 - 10/2020 echo mild AI    7. 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 DVT  - 07/2020 [place] with deep vein thrombosis involving the left common femoral vein, femoral vein, popliteal vein and profundus femoral vein. - occurred after left inguinal hernia repair. Note coumadin was held in periop period, prior to that his INR had been subtherapeutic.    -if needs to be off in the future would consider bridging.  - he reports pcp working on DOAC assistance. Reports a copay of 30-40$ dollars would be too much     9. 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 [place]   Past Medical History:  Diagnosis Date   Anxiety disorder    Benign prostatic hypertrophy    Bilateral cataracts    CAD (coronary artery disease)    Catheterization 2009, mild CAD   Carotid artery disease (HCC)    Doppler, September,  2011, less than 50% bilateral   COPD (chronic obstructive pulmonary disease) (HCC)    CVA (cerebral infarction)    Hospitalization.  MRI...    Left anterior cerebral artery territory, September, 2011... Coumadin started   DYSLIPIDEMIA 11/01/2009   Qualifier: Diagnosis of  By: Myrtis Ser, MD, [facility], Lemmie Evens    Ejection fraction    EF 60%   Ejection fraction    EF 55-60%, echo, September, 2011   Gastroesophageal reflux disease    History of deep venous thrombosis (DVT) of distal vein of left lower extremity 08/05/2020   History of left inguinal hernia    Hyperlipidemia    Persistent atrial fibrillation (HCC)        Ptosis of eyelid, left    Surgery in the past   Sweating abnormality    Episodes of sweating appeared to be related to a delayed reaction to niacin   Vitamin D insufficiency      Allergies  Allergen Reactions   Asa [Aspirin]     GI upset   Niacin     hot flashes   Pravastatin     myalgias   Rosuvastatin     myalgias   Simvastatin  Myalgias      Current Outpatient Medications  Medication Sig Dispense Refill   albuterol (PROVENTIL) (2.5 MG/3ML) 0.083% nebulizer solution USE 1 VIAL IN NEBULIZER EVERY 6 HOURS AS NEEDED FOR WHEEZING FOR SHORTNESS OF BREATH 180 mL 11   albuterol (VENTOLIN HFA) 108 (90 Base) MCG/ACT inhaler Inhale 2 puffs into the lungs every 6 (six) hours as needed for wheezing or shortness of breath. 8 g 11   Budeson-Glycopyrrol-Formoterol (BREZTRI AEROSPHERE) 160-9-4.8 MCG/ACT AERO Inhale 2 puffs into the lungs in the morning and at bedtime. 10.7 g 2   Evolocumab (REPATHA SURECLICK) 140 MG/ML SOAJ Inject 140 mg into the skin every 14 (fourteen) days. 6 mL 3   furosemide (LASIX) 20 MG tablet TAKE 1 TABLET DAILY AS NEEDED FOR EDEMA (SWELLING). 90 tablet 1   ketorolac (ACULAR) 0.5 % ophthalmic solution PLACE 1 DROP INTO THE LEFT EYE 4 TIMES DAILY. 25 mL 0   metoprolol tartrate (LOPRESSOR) 25 MG tablet Take 1 tablet (25 mg total) by mouth 2 (two)  times daily. 180 tablet 2   pantoprazole (PROTONIX) 40 MG tablet TAKE 1 TABLET BY MOUTH ONCE DAILY 30-60  MINUTES  BEFORE  FIRST  MEAL  OF  THE  DAY 30 tablet 0   traZODone (DESYREL) 50 MG tablet TAKE 1 TABLET AT BEDTIME AS NEEDED FOR SLEEP 90 tablet 3   warfarin (COUMADIN) 5 MG tablet TAKE 1/2 TO 1 TABLET BY MOUTH ONCE DAILY AS DIRECTED BY COUMADIN CLINIC 90 tablet 1   No current facility-administered medications for this visit.     Past Surgical History:  Procedure Laterality Date   APPENDECTOMY     CATARACT EXTRACTION, BILATERAL     INGUINAL HERNIA REPAIR Left 07/02/2019   INGUINAL HERNIA REPAIR Left 07/01/2020   Procedure: LEFT INGUINAL HERNIA REPAIR WITH MESH;  Surgeon: Lucretia Roers, MD;  Location: [facility];  Service: General;  Laterality: Left;   KNEE SURGERY Right 2019   torn ligament?    TRANSURETHRAL RESECTION OF PROSTATE N/A 08/19/2018   Procedure: TRANSURETHRAL RESECTION OF THE PROSTATE (TURP) OF PROSTATE ABCESS;  Surgeon: Sebastian Ache, MD;  Location: [facility];  Service: Urology;  Laterality: N/A;     Allergies  Allergen Reactions   Asa [Aspirin]     GI upset   Niacin     hot flashes   Pravastatin     myalgias   Rosuvastatin     myalgias   Simvastatin     Myalgias       Family History  Problem Relation Age of Onset   Heart attack Mother    Other Other        Insignificant for premature CAD   Heart disease Sister    Hyperlipidemia Sister    Kidney disease Son    Heart attack Paternal Grandfather      Social History Mr. Heinbaugh reports that he quit smoking about 32 years ago. His smoking use included cigarettes. He started smoking about 83 years ago. He has a 150 pack-year smoking history. He has been exposed to tobacco smoke. He quit smokeless tobacco use about 22 years ago.  His smokeless tobacco use included chew. Mr. Goodly reports no history of alcohol use.   Review of Systems CONSTITUTIONAL: No weight loss, fever, chills, weakness or  fatigue.  HEENT: Eyes: No visual loss, blurred vision, double vision or yellow sclerae.No hearing loss, sneezing, congestion, runny nose or sore throat.  SKIN: No rash or itching.  CARDIOVASCULAR:per hpi RESPIRATORY: No shortness of  breath, cough or sputum.  GASTROINTESTINAL: No anorexia, nausea, vomiting or diarrhea. No abdominal pain or blood.  GENITOURINARY: No burning on urination, no polyuria NEUROLOGICAL: No headache, dizziness, syncope, paralysis, ataxia, numbness or tingling in the extremities. No change in bowel or bladder control.  MUSCULOSKELETAL: No muscle, back pain, joint pain or stiffness.  LYMPHATICS: No enlarged nodes. No history of splenectomy.  PSYCHIATRIC: No history of depression or anxiety.  ENDOCRINOLOGIC: No reports of sweating, cold or heat intolerance. No polyuria or polydipsia.  Marland Kitchen   Physical Examination Today's Vitals   03/04/23 0845 03/04/23 0848 03/04/23 0903  BP: (!) 142/70 (!) 140/78 124/70  Pulse: 70    SpO2: 96%    Weight: 167 lb 6.4 oz (75.9 kg)    Height: 5\' 6"  (1.676 m)     Body mass index is 27.02 kg/m.  Gen: resting comfortably, no acute distress HEENT: no scleral icterus, pupils equal round and reactive, no palptable cervical adenopathy,  CV irreg, no mrg, no jvd. +left carotid bruit Resp: Clear to auscultation bilaterally GI: abdomen is soft, non-tender, non-distended, normal bowel sounds, no hepatosplenomegaly MSK: extremities are warm, no edema.  Skin: warm, no rash Neuro:  no focal deficits Psych: appropriate affect   Diagnostic Studies  02/2015 carotid US: bilateral 1-39% ICA disease. Recs for f/u 2 years   02/2008 cath FINDINGS:  The aortic pressure 98/71 with a mean of 83, left ventricular  pressure 99/20.    Coronary angiography.  The left mainstem is very short.  There is no  stenosis present.    The LAD is a large-caliber vessel that courses down and reaches the LV  apex.  The anatomy is a bit unusual as there is a  Tavaras Goody off the  proximal LAD that runs the course of an intermediate.  It supplies  multiple Nathalie Cavendish vessels to the lateral wall.  There has been more of a  traditional appearing diagonal Josefine Fuhr from further down in the mid-LAD.  The LAD at the origin of the second diagonal Markiesha Delia has a 30% stenosis.  Further down in the vessel, there is minor nonobstructive plaque but no  significant stenosis in the LAD or its Francee Setzer vessels.  There are two  diagonals in the intermediate territory Ornella Coderre but all arising from the  proximal and mid LAD.    The left circumflex is very small.  It courses down the AV groove and  supplies no significant Rahcel Shutes vessels.    Right coronary artery.  The right coronary artery is dominant.  Proximally, there is an eccentric 50% stenosis.  This is a smooth  appearing lesion of the vessels.  The midportion of the right coronary  artery is dilated.  There is a transition zone into more normal-  appearing vessel where there is a 30% stenosis.  There is a large acute  marginal Calyn Sivils and a moderate size PDA and posterolateral Khyan Oats.  There are no other significant stenoses noted.  With intracoronary  nitroglycerin, the lesion still appears no worse than 50%.    Left ventriculography shows low normal LV function with an LVEF in the  range of 50-55%.  There was no significant mitral regurgitation.    ASSESSMENT:  1. Mild-to-moderate right coronary artery stenosis as outlined above.  2. Minimal left anterior descending and left circumflex stenosis.  3. Normal left ventricular function.    Post cat PLAN:  Recommend medical therapy for nonobstructive CAD.  The patient  will resume Coumadin tonight.  He will drop  his aspirin dose to 81 mg.  In reviewing Dr. Myrtis Ser notes, the patient may not require long-term  Coumadin.  Recommend aggressive secondary risk reduction for the  patient's diffuse nonobstructive CAD.     08/2016 event monitor Telemetry tracings show rate  controlled atrial fibrillation No symptoms reported     10/2020 echo IMPRESSIONS     1. Left ventricular ejection fraction, by estimation, is 60 to 65%. The  left ventricle has normal function. The left ventricle has no regional  wall motion abnormalities. Left ventricular diastolic parameters are  indeterminate. Elevated left atrial  pressure.   2. Right ventricular systolic function is normal. The right ventricular  size is normal.   3. Left atrial size was severely dilated.   4. Right atrial size was severely dilated.   5. The mitral valve is normal in structure. Trivial mitral valve  regurgitation. No evidence of mitral stenosis.   6. The aortic valve has an indeterminant number of cusps. There is mild  calcification of the aortic valve. There is mild thickening of the aortic  valve. Aortic valve regurgitation is mild. No aortic stenosis is present.   7. The inferior vena cava is normal in size with greater than 50%  respiratory variability, suggesting right atrial pressure of 3 mmHg.          Assessment and Plan   1. Chronic afib/acquired thrombophilia - DOACs have been too expensive, he prefers staying on coumadin - denies any symptomw, continue current meds   2. CAD - mild disease on prior cath - no symptoms, continue current meds   3. Carotid stenosis - mild disease by recent US -we will repeat US next year   4. Aortic regurgitation - mild by recent echo - repeat study in next year or two   5. HYperlipidemia - intolerant to multiple statins - he is at goal on repatha with his LDL, continue   6. LE edema - continue prn lasix, controlled  F/u 6 months     Antoine Poche, M.D

## 2023-03-21 ENCOUNTER — Ambulatory Visit: Payer: Medicare HMO | Attending: Cardiology | Admitting: *Deleted

## 2023-03-21 DIAGNOSIS — Z5181 Encounter for therapeutic drug level monitoring: Secondary | ICD-10-CM | POA: Diagnosis not present

## 2023-03-21 DIAGNOSIS — L570 Actinic keratosis: Secondary | ICD-10-CM | POA: Diagnosis not present

## 2023-03-21 DIAGNOSIS — I4891 Unspecified atrial fibrillation: Secondary | ICD-10-CM | POA: Diagnosis not present

## 2023-03-21 LAB — POCT INR: INR: 2.7 (ref 2.0–3.0)

## 2023-03-21 NOTE — Patient Instructions (Signed)
Continue warfarin 1 tablet daily except 1/2 tablet on Sundays and Wednesdays Recheck in 6 wks 

## 2023-03-26 ENCOUNTER — Ambulatory Visit (INDEPENDENT_AMBULATORY_CARE_PROVIDER_SITE_OTHER): Payer: Medicare HMO

## 2023-03-26 VITALS — Ht 66.0 in | Wt 165.0 lb

## 2023-03-26 DIAGNOSIS — Z0000 Encounter for general adult medical examination without abnormal findings: Secondary | ICD-10-CM

## 2023-03-26 NOTE — Progress Notes (Signed)
Subjective:   [name] is a 82 y.o. male who presents for Medicare Annual/Subsequent preventive examination.  Visit Complete: Virtual I connected with  [name] on 03/26/23 by a audio enabled telemedicine application and verified that I am speaking with the correct person using two identifiers.  Patient Location: Home  Provider Location: Home Office  I discussed the limitations of evaluation and management by telemedicine. The patient expressed understanding and agreed to proceed.  Vital Signs: Because this visit was a virtual/telehealth visit, some criteria may be missing or patient reported. Any vitals not documented were not able to be obtained and vitals that have been documented are patient reported.  Patient Medicare AWV questionnaire was completed by the patient on 03/26/2023; I have confirmed that all information answered by patient is correct and no changes since this date.  Cardiac Risk Factors include: advanced age (>3men, >62 women);male gender;dyslipidemia     Objective:    Today's Vitals   03/26/23 0835  Weight: 165 lb (74.8 kg)  Height: 5\' 6"  (1.676 m)   Body mass index is 26.63 kg/m.     03/26/2023    8:40 AM 12/17/2022    1:00 PM 03/27/2022    1:55 PM 03/20/2022    8:57 AM 03/17/2021    4:20 PM 07/01/2020    7:26 AM 06/29/2020   11:12 AM  Advanced Directives  Does Patient Have a Medical Advance Directive? Yes Yes Yes Yes Yes Yes Yes  Type of Estate agent of [place];Living will Living will;Out of facility DNR (pink MOST or yellow form) Living will Living will Healthcare Power of [place];Living will Healthcare Power of [employer] Power of Attorney  Does patient want to make changes to medical advance directive?   No - Patient declined No - Patient declined  No - Patient declined   Copy of Healthcare Power of Attorney in Chart? No - copy requested    No - copy requested No - copy requested No - copy requested  Would  patient like information on creating a medical advance directive?      No - Patient declined     Current Medications (verified) Outpatient Encounter Medications as of 03/26/2023  Medication Sig   albuterol (PROVENTIL) (2.5 MG/3ML) 0.083% nebulizer solution USE 1 VIAL IN NEBULIZER EVERY 6 HOURS AS NEEDED FOR WHEEZING FOR SHORTNESS OF BREATH   albuterol (VENTOLIN HFA) 108 (90 Base) MCG/ACT inhaler Inhale 2 puffs into the lungs every 6 (six) hours as needed for wheezing or shortness of breath.   Budeson-Glycopyrrol-Formoterol (BREZTRI AEROSPHERE) 160-9-4.8 MCG/ACT AERO Inhale 2 puffs into the lungs in the morning and at bedtime.   Evolocumab (REPATHA SURECLICK) 140 MG/ML SOAJ Inject 140 mg into the skin every 14 (fourteen) days.   furosemide (LASIX) 20 MG tablet TAKE 1 TABLET DAILY AS NEEDED FOR EDEMA (SWELLING).   ketorolac (ACULAR) 0.5 % ophthalmic solution PLACE 1 DROP INTO THE LEFT EYE 4 TIMES DAILY.   metoprolol tartrate (LOPRESSOR) 25 MG tablet Take 1 tablet (25 mg total) by mouth 2 (two) times daily.   pantoprazole (PROTONIX) 40 MG tablet TAKE 1 TABLET BY MOUTH ONCE DAILY 30-60  MINUTES  BEFORE  FIRST  MEAL  OF  THE  DAY   traZODone (DESYREL) 50 MG tablet TAKE 1 TABLET AT BEDTIME AS NEEDED FOR SLEEP   warfarin (COUMADIN) 5 MG tablet TAKE 1/2 TO 1 TABLET BY MOUTH ONCE DAILY AS DIRECTED BY COUMADIN CLINIC   No facility-administered encounter medications on  file as of 03/26/2023.    Allergies (verified) Asa [aspirin], Niacin, Pravastatin, Rosuvastatin, and Simvastatin   History: Past Medical History:  Diagnosis Date   Anxiety disorder    Benign prostatic hypertrophy    Bilateral cataracts    CAD (coronary artery disease)    Catheterization 2009, mild CAD   Carotid artery disease (HCC)    Doppler, September, 2011, less than 50% bilateral   COPD (chronic obstructive pulmonary disease) (HCC)    CVA (cerebral infarction)    Hospitalization.  MRI...    Left anterior cerebral artery  territory, September, 2011... Coumadin started   DYSLIPIDEMIA 11/01/2009   Qualifier: Diagnosis of  By: Myrtis Ser, MD, [facility], [place]    Ejection fraction    EF 60%   Ejection fraction    EF 55-60%, echo, September, 2011   Gastroesophageal reflux disease    History of deep venous thrombosis (DVT) of distal vein of left lower extremity 08/05/2020   History of left inguinal hernia    Hyperlipidemia    Persistent atrial fibrillation (HCC)        Ptosis of eyelid, left    Surgery in the past   Sweating abnormality    Episodes of sweating appeared to be related to a delayed reaction to niacin   Vitamin D insufficiency    Past Surgical History:  Procedure Laterality Date   APPENDECTOMY     CATARACT EXTRACTION, BILATERAL     INGUINAL HERNIA REPAIR Left 07/02/2019   INGUINAL HERNIA REPAIR Left 07/01/2020   Procedure: LEFT INGUINAL HERNIA REPAIR WITH MESH;  Surgeon: Lucretia Roers, MD;  Location: [facility];  Service: General;  Laterality: Left;   KNEE SURGERY Right 2019   torn ligament?    TRANSURETHRAL RESECTION OF PROSTATE N/A 08/19/2018   Procedure: TRANSURETHRAL RESECTION OF THE PROSTATE (TURP) OF PROSTATE ABCESS;  Surgeon: Sebastian Ache, MD;  Location: [facility];  Service: Urology;  Laterality: N/A;   Family History  Problem Relation Age of Onset   Heart attack Mother    Other Other        Insignificant for premature CAD   Heart disease Sister    Hyperlipidemia Sister    Kidney disease Son    Heart attack Paternal Grandfather    Social History   Socioeconomic History   Marital status: Married    Spouse name: Marguerite Jarboe   Number of children: 5   Years of education: 11   Highest education level: 11th grade  Occupational History   Occupation: Retired Biochemist, clinical   Tobacco Use   Smoking status: Former    Current packs/day: 0.00    Average packs/day: 3.0 packs/day for 50.0 years (150.0 ttl pk-yrs)    Types: Cigarettes    Start date: 03/18/1940    Quit date: 03/18/1990     Years since quitting: 33.0    Passive exposure: Past   Smokeless tobacco: Former    Types: Chew    Quit date: 06/18/2000  Vaping Use   Vaping status: Never Used  Substance and Sexual Activity   Alcohol use: No    Alcohol/week: 0.0 standard drinks of alcohol   Drug use: No   Sexual activity: Not Currently    Partners: Female  Other Topics Concern   Not on file  Social History Narrative   Quit smoking in 1993. No significant alcohol abuse. Retired from trucking.    Social Determinants of Health   Financial Resource Strain: Low Risk  (03/26/2023)   Overall Financial Resource  Strain (CARDIA)    Difficulty of Paying Living Expenses: Not hard at all  Food Insecurity: No Food Insecurity (03/26/2023)   Hunger Vital Sign    Worried About Running Out of Food in the Last Year: Never true    Ran Out of Food in the Last Year: Never true  Transportation Needs: No Transportation Needs (03/26/2023)   PRAPARE - Administrator, Civil Service (Medical): No    Lack of Transportation (Non-Medical): No  Physical Activity: Insufficiently Active (03/26/2023)   Exercise Vital Sign    Days of Exercise per Week: 3 days    Minutes of Exercise per Session: 30 min  Stress: No Stress Concern Present (03/26/2023)   [employer] of Occupational Health - Occupational Stress Questionnaire    Feeling of Stress : Not at all  Social Connections: Moderately Integrated (03/26/2023)   Social Connection and Isolation Panel [NHANES]    Frequency of Communication with Friends and Family: More than three times a week    Frequency of Social Gatherings with Friends and Family: More than three times a week    Attends Religious Services: More than 4 times per year    Active Member of [employer] or Organizations: No    Attends Engineer, structural: Never    Marital Status: Married    Tobacco Counseling Counseling given: Not Answered   Clinical Intake:  Pre-visit preparation completed: Yes  Pain  : No/denies pain     Nutritional Risks: None Diabetes: No  How often do you need to have someone help you when you read instructions, pamphlets, or other written materials from your doctor or pharmacy?: 1 - Never  Interpreter Needed?: No  Information entered by :: Renie Ora, LPN   Activities of Daily Living    03/26/2023    8:40 AM 03/27/2022    1:54 PM  In your present state of health, do you have any difficulty performing the following activities:  Hearing? 0 0  Vision? 0 0  Difficulty concentrating or making decisions? 0 0  Walking or climbing stairs? 0 1  Comment  Unsteady Balance/Gait  Dressing or bathing? 0 0  Doing errands, shopping? 0 0  Preparing Food and eating ? N N  Using the Toilet? N N  In the past six months, have you accidently leaked urine? N N  Do you have problems with loss of bowel control? N N  Managing your Medications? N N  Managing your Finances? N N  Housekeeping or managing your Housekeeping? N N    Patient Care Team: Sonny Masters, FNP as PCP - General (Family Medicine) Wyline Mood, Dorothe Pea, MD as PCP - Cardiology (Cardiology) Wyline Mood Dorothe Pea, MD as Consulting Physician (Cardiology) Bjorn Pippin, MD as Attending Physician (Urology) Danella Maiers, [facility] (Pharmacist)  Indicate any recent Medical Services you may have received from other than [facility] providers in the past year (date may be approximate).     Assessment:   This is a routine wellness examination for [place].  Hearing/Vision screen Vision Screening - Comments:: Wears rx glasses - up to date with routine eye exams with  [facility]    Goals Addressed             This Visit's Progress    DIET - INCREASE WATER INTAKE   On track    Try to drink 6-8 glasses of water daily.       Depression Screen    03/26/2023    8:39 AM  12/28/2022    8:17 AM 10/05/2022    8:17 AM 07/05/2022   11:54 AM 03/27/2022    1:50 PM 09/19/2021    9:48 AM 07/26/2021   10:22 AM  PHQ 2/9  Scores  PHQ - 2 Score 0 0 0 0 0 0 0  PHQ- 9 Score   0 1  1 3     Fall Risk    03/26/2023    8:37 AM 07/05/2022   11:54 AM 03/27/2022    1:53 PM 03/27/2022    1:45 PM 03/20/2022    8:59 AM  Fall Risk   Falls in the past year? 0 0 0 0 0  Number falls in past yr: 0  0    Injury with Fall? 0  0    Risk for fall due to : No Fall Risks  No Fall Risks    Follow up Falls prevention discussed  Falls evaluation completed;Education provided;Falls prevention discussed Falls evaluation completed;Education provided;Falls prevention discussed Falls evaluation completed    MEDICARE RISK AT HOME: Medicare Risk at Home Any stairs in or around the home?: Yes If so, are there any without handrails?: No Home free of loose throw rugs in walkways, pet beds, electrical cords, etc?: Yes Adequate lighting in your home to reduce risk of falls?: Yes Life alert?: No Use of a cane, walker or w/c?: No Grab bars in the bathroom?: Yes Shower chair or bench in shower?: Yes Elevated toilet seat or a handicapped toilet?: Yes  TIMED UP AND GO:  Was the test performed?  No    Cognitive Function:        03/26/2023    8:40 AM 03/20/2022    8:58 AM 04/10/2019    8:49 AM  6CIT Screen  What Year? 0 points 0 points 0 points  What month? 0 points 0 points 0 points  What time? 0 points 0 points 0 points  Count back from 20 0 points 0 points 0 points  Months in reverse 0 points 0 points 0 points  Repeat phrase 0 points 0 points 2 points  Total Score 0 points 0 points 2 points    Immunizations Immunization History  Administered Date(s) Administered   Fluad Quad(high Dose 65+) 03/18/2020, 03/27/2022   Influenza Whole 03/18/2012   Influenza, High Dose Seasonal PF 03/17/2019   Influenza,inj,Quad PF,6+ Mos 04/02/2017, 03/17/2018   Influenza-Unspecified 03/17/2019, 03/17/2021   Moderna Sars-Covid-2 Vaccination 09/30/2019, 10/27/2019   Pneumococcal Conjugate-13 12/17/2013   Pneumococcal Polysaccharide-23  03/18/2005   Zoster Recombinant(Shingrix) 09/19/2021, 01/24/2022    TDAP status: Due, Education has been provided regarding the importance of this vaccine. Advised may receive this vaccine at local pharmacy or Health Dept. Aware to provide a copy of the vaccination record if obtained from local pharmacy or Health Dept. Verbalized acceptance and understanding.  Flu Vaccine status: Due, Education has been provided regarding the importance of this vaccine. Advised may receive this vaccine at local pharmacy or Health Dept. Aware to provide a copy of the vaccination record if obtained from local pharmacy or Health Dept. Verbalized acceptance and understanding.  Pneumococcal vaccine status: Up to date  Covid-19 vaccine status: Completed vaccines  Qualifies for Shingles Vaccine? Yes   Zostavax completed Yes   Shingrix Completed?: Yes  Screening Tests Health Maintenance  Topic Date Due   COVID-19 Vaccine (3 - Moderna risk series) 11/24/2019   INFLUENZA VACCINE  01/17/2023   Medicare Annual Wellness (AWV)  03/25/2024   Pneumonia Vaccine 19+ Years old  Completed  Zoster Vaccines- Shingrix  Completed   HPV VACCINES  Aged Out   DTaP/Tdap/Td  Discontinued    Health Maintenance  Health Maintenance Due  Topic Date Due   COVID-19 Vaccine (3 - Moderna risk series) 11/24/2019   INFLUENZA VACCINE  01/17/2023    Colorectal cancer screening: No longer required.   Lung Cancer Screening: (Low Dose CT Chest recommended if Age 3-80 years, 20 pack-year currently smoking OR have quit w/in 15years.) does not qualify.   Lung Cancer Screening Referral: n/a  Additional Screening:  Hepatitis C Screening: does not qualify;  Vision Screening: Recommended annual ophthalmology exams for early detection of glaucoma and other disorders of the eye. Is the patient up to date with their annual eye exam?  Yes  Who is the provider or what is the name of the office in which the patient attends annual eye  exams? [facility]  If pt is not established with a provider, would they like to be referred to a provider to establish care? No .   Dental Screening: Recommended annual dental exams for proper oral hygiene   Community Resource Referral / Chronic Care Management: CRR required this visit?  No   CCM required this visit?  No     Plan:     I have personally reviewed and noted the following in the patient's chart:   Medical and social history Use of alcohol, tobacco or illicit drugs  Current medications and supplements including opioid prescriptions. Patient is not currently taking opioid prescriptions. Functional ability and status Nutritional status Physical activity Advanced directives List of other physicians Hospitalizations, surgeries, and ER visits in previous 12 months Vitals Screenings to include cognitive, depression, and falls Referrals and appointments  In addition, I have reviewed and discussed with patient certain preventive protocols, quality metrics, and best practice recommendations. A written personalized care plan for preventive services as well as general preventive health recommendations were provided to patient.     Lorrene Reid, LPN   95/11/[id number]   After Visit Summary: (MyChart) Due to this being a telephonic visit, the after visit summary with patients personalized plan was offered to patient via MyChart   Nurse Notes: none

## 2023-03-26 NOTE — Patient Instructions (Signed)
[name] , Thank you for taking time to come for your Medicare Wellness Visit. I appreciate your ongoing commitment to your health goals. Please review the following plan we discussed and let me know if I can assist you in the future.   Referrals/Orders/Follow-Ups/Clinician Recommendations: Aim for 30 minutes of exercise or brisk walking, 6-8 glasses of water, and 5 servings of fruits and vegetables each day.   This is a list of the screening recommended for you and due dates:  Health Maintenance  Topic Date Due   COVID-19 Vaccine (3 - Moderna risk series) 11/24/2019   Flu Shot  01/17/2023   Medicare Annual Wellness Visit  03/25/2024   Pneumonia Vaccine  Completed   Zoster (Shingles) Vaccine  Completed   HPV Vaccine  Aged Out   DTaP/Tdap/Td vaccine  Discontinued    Advanced directives: (Copy Requested) Please bring a copy of your health care power of attorney and living will to the office to be added to your chart at your convenience.  Next Medicare Annual Wellness Visit scheduled for next year: Yes  insert Preventive Care attachment Insert FALL PREVENTION attachment if needed

## 2023-04-02 ENCOUNTER — Ambulatory Visit (INDEPENDENT_AMBULATORY_CARE_PROVIDER_SITE_OTHER): Payer: Medicare HMO | Admitting: Family Medicine

## 2023-04-02 ENCOUNTER — Encounter: Payer: Self-pay | Admitting: Family Medicine

## 2023-04-02 VITALS — BP 140/75 | HR 82 | Temp 97.2°F | Ht 66.0 in | Wt 170.0 lb

## 2023-04-02 DIAGNOSIS — N182 Chronic kidney disease, stage 2 (mild): Secondary | ICD-10-CM

## 2023-04-02 DIAGNOSIS — R2681 Unsteadiness on feet: Secondary | ICD-10-CM | POA: Diagnosis not present

## 2023-04-02 DIAGNOSIS — R7303 Prediabetes: Secondary | ICD-10-CM

## 2023-04-02 DIAGNOSIS — R252 Cramp and spasm: Secondary | ICD-10-CM

## 2023-04-02 DIAGNOSIS — J449 Chronic obstructive pulmonary disease, unspecified: Secondary | ICD-10-CM

## 2023-04-02 DIAGNOSIS — K5901 Slow transit constipation: Secondary | ICD-10-CM | POA: Diagnosis not present

## 2023-04-02 DIAGNOSIS — I4819 Other persistent atrial fibrillation: Secondary | ICD-10-CM

## 2023-04-02 DIAGNOSIS — E782 Mixed hyperlipidemia: Secondary | ICD-10-CM

## 2023-04-02 LAB — LIPID PANEL
Chol/HDL Ratio: 3.5 {ratio} (ref 0.0–5.0)
Cholesterol, Total: 128 mg/dL (ref 100–199)
HDL: 37 mg/dL — ABNORMAL LOW (ref >39)
LDL Chol Calc (NIH): 67 mg/dL (ref 0–99)
Triglycerides: 139 mg/dL (ref 0–149)
VLDL Cholesterol Cal: 24 mg/dL (ref 5–40)

## 2023-04-02 LAB — BAYER DCA HB A1C WAIVED: HB A1C (BAYER DCA - WAIVED): 5.7 % — ABNORMAL HIGH (ref 4.8–5.6)

## 2023-04-02 MED ORDER — POLYETHYLENE GLYCOL 3350 17 GM/SCOOP PO POWD
17.0000 g | Freq: Every day | ORAL | 1 refills | Status: DC
Start: 2023-04-02 — End: 2023-05-07

## 2023-04-02 MED ORDER — BREZTRI AEROSPHERE 160-9-4.8 MCG/ACT IN AERO
2.0000 | INHALATION_SPRAY | Freq: Two times a day (BID) | RESPIRATORY_TRACT | 2 refills | Status: DC
Start: 2023-04-02 — End: 2023-09-17

## 2023-04-02 NOTE — Patient Instructions (Addendum)

## 2023-04-02 NOTE — Progress Notes (Signed)
Subjective:  Patient ID: [name], male    DOB: 10/20/1940, 82 y.o.   MRN: [id number]  Patient Care Team: Sonny Masters, FNP as PCP - General (Family Medicine) Wyline Mood, Dorothe Pea, MD as PCP - Cardiology (Cardiology) Wyline Mood Dorothe Pea, MD as Consulting Physician (Cardiology) Bjorn Pippin, MD as Attending Physician (Urology) Danella Maiers, [facility] (Pharmacist)   Chief Complaint:  Medical Management of Chronic Issues (3 month chronic check up )   HPI: [name] is a 82 y.o. male presenting on 04/02/2023 for Medical Management of Chronic Issues (3 month chronic check up )   Discussed the use of AI scribe software for clinical note transcription with the patient, who gave verbal consent to proceed.  History of Present Illness   The patient, with a history of atrial fibrillation managed with Coumadin, diabetes, and COPD, presents today for chronic follow up. He reports concerns of unsteadiness and a dragging right leg that began approximately 1.5-2 months ago. He denies associated dizziness or weakness, but reports a sensation of bloating and constipation, with bowel movements only once or twice a week. He also mentions occasional leg cramps, particularly when reclining.  The patient's INR has been well controlled, with no abnormal bleeding or bruising reported. His heart rate is also well controlled, and he denies any symptoms of increased hunger, thirst, or urination related to his diabetes. His COPD is reportedly well managed with Breztri, and he rarely requires albuterol.  The patient also reports a fall approximately five months ago, which resulted in a cracked pelvic bone. This was attributed to an incident with his dogs and not to his reported unsteadiness.   The patient's blood pressure has been stable, and he has not experienced any abnormal symptoms such as feeling cold, weak, or tired. He has been managing his diabetes through diet control, with his most recent A1c at  5.7, down from a previous 6.2. He is also on Repatha for cholesterol management, with no reported muscle aches or pains.          Relevant past medical, surgical, family, and social history reviewed and updated as indicated.  Allergies and medications reviewed and updated. Data reviewed: Chart in Epic.   Past Medical History:  Diagnosis Date   Anxiety disorder    Benign prostatic hypertrophy    Bilateral cataracts    CAD (coronary artery disease)    Catheterization 2009, mild CAD   Carotid artery disease (HCC)    Doppler, September, 2011, less than 50% bilateral   COPD (chronic obstructive pulmonary disease) (HCC)    CVA (cerebral infarction)    Hospitalization.  MRI...    Left anterior cerebral artery territory, September, 2011... Coumadin started   DYSLIPIDEMIA 11/01/2009   Qualifier: Diagnosis of  By: Myrtis Ser, MD, [facility], Lemmie Evens    Ejection fraction    EF 60%   Ejection fraction    EF 55-60%, echo, September, 2011   Gastroesophageal reflux disease    History of deep venous thrombosis (DVT) of distal vein of left lower extremity 08/05/2020   History of left inguinal hernia    Hyperlipidemia    Persistent atrial fibrillation (HCC)        Ptosis of eyelid, left    Surgery in the past   Sweating abnormality    Episodes of sweating appeared to be related to a delayed reaction to niacin   Vitamin D insufficiency     Past Surgical History:  Procedure Laterality Date  APPENDECTOMY     CATARACT EXTRACTION, BILATERAL     INGUINAL HERNIA REPAIR Left 07/02/2019   INGUINAL HERNIA REPAIR Left 07/01/2020   Procedure: LEFT INGUINAL HERNIA REPAIR WITH MESH;  Surgeon: Lucretia Roers, MD;  Location: [facility];  Service: General;  Laterality: Left;   KNEE SURGERY Right 2019   torn ligament?    TRANSURETHRAL RESECTION OF PROSTATE N/A 08/19/2018   Procedure: TRANSURETHRAL RESECTION OF THE PROSTATE (TURP) OF PROSTATE ABCESS;  Surgeon: Sebastian Ache, MD;  Location: [facility];  Service:  Urology;  Laterality: N/A;    Social History   Socioeconomic History   Marital status: Married    Spouse name: Delonta Malveaux   Number of children: 5   Years of education: 11   Highest education level: 11th grade  Occupational History   Occupation: Retired Biochemist, clinical   Tobacco Use   Smoking status: Former    Current packs/day: 0.00    Average packs/day: 3.0 packs/day for 50.0 years (150.0 ttl pk-yrs)    Types: Cigarettes    Start date: 03/18/1940    Quit date: 03/18/1990    Years since quitting: 33.0    Passive exposure: Past   Smokeless tobacco: Former    Types: Chew    Quit date: 06/18/2000  Vaping Use   Vaping status: Never Used  Substance and Sexual Activity   Alcohol use: No    Alcohol/week: 0.0 standard drinks of alcohol   Drug use: No   Sexual activity: Not Currently    Partners: Female  Other Topics Concern   Not on file  Social History Narrative   Quit smoking in 1993. No significant alcohol abuse. Retired from trucking.    Social Determinants of Health   Financial Resource Strain: Low Risk  (03/26/2023)   Overall Financial Resource Strain (CARDIA)    Difficulty of Paying Living Expenses: Not hard at all  Food Insecurity: No Food Insecurity (03/26/2023)   Hunger Vital Sign    Worried About Running Out of Food in the Last Year: Never true    Ran Out of Food in the Last Year: Never true  Transportation Needs: No Transportation Needs (03/26/2023)   PRAPARE - Administrator, Civil Service (Medical): No    Lack of Transportation (Non-Medical): No  Physical Activity: Insufficiently Active (03/26/2023)   Exercise Vital Sign    Days of Exercise per Week: 3 days    Minutes of Exercise per Session: 30 min  Stress: No Stress Concern Present (03/26/2023)   [employer] of Occupational Health - Occupational Stress Questionnaire    Feeling of Stress : Not at all  Social Connections: Moderately Integrated (03/26/2023)   Social Connection and Isolation  Panel [NHANES]    Frequency of Communication with Friends and Family: More than three times a week    Frequency of Social Gatherings with Friends and Family: More than three times a week    Attends Religious Services: More than 4 times per year    Active Member of [employer] or Organizations: No    Attends Banker Meetings: Never    Marital Status: Married  Catering manager Violence: Not At Risk (03/26/2023)   Humiliation, Afraid, Rape, and Kick questionnaire    Fear of Current or Ex-Partner: No    Emotionally Abused: No    Physically Abused: No    Sexually Abused: No    Outpatient Encounter Medications as of 04/02/2023  Medication Sig   albuterol (PROVENTIL) (2.5 MG/3ML) 0.083% nebulizer solution  USE 1 VIAL IN NEBULIZER EVERY 6 HOURS AS NEEDED FOR WHEEZING FOR SHORTNESS OF BREATH   albuterol (VENTOLIN HFA) 108 (90 Base) MCG/ACT inhaler Inhale 2 puffs into the lungs every 6 (six) hours as needed for wheezing or shortness of breath.   Evolocumab (REPATHA SURECLICK) 140 MG/ML SOAJ Inject 140 mg into the skin every 14 (fourteen) days.   furosemide (LASIX) 20 MG tablet TAKE 1 TABLET DAILY AS NEEDED FOR EDEMA (SWELLING).   ketorolac (ACULAR) 0.5 % ophthalmic solution PLACE 1 DROP INTO THE LEFT EYE 4 TIMES DAILY.   metoprolol tartrate (LOPRESSOR) 25 MG tablet Take 1 tablet (25 mg total) by mouth 2 (two) times daily.   pantoprazole (PROTONIX) 40 MG tablet TAKE 1 TABLET BY MOUTH ONCE DAILY 30-60  MINUTES  BEFORE  FIRST  MEAL  OF  THE  DAY   polyethylene glycol powder (GLYCOLAX/MIRALAX) 17 GM/SCOOP powder Take 17 g by mouth daily.   warfarin (COUMADIN) 5 MG tablet TAKE 1/2 TO 1 TABLET BY MOUTH ONCE DAILY AS DIRECTED BY COUMADIN CLINIC   [DISCONTINUED] Budeson-Glycopyrrol-Formoterol (BREZTRI AEROSPHERE) 160-9-4.8 MCG/ACT AERO Inhale 2 puffs into the lungs in the morning and at bedtime.   Budeson-Glycopyrrol-Formoterol (BREZTRI AEROSPHERE) 160-9-4.8 MCG/ACT AERO Inhale 2 puffs into the lungs  in the morning and at bedtime.   [DISCONTINUED] traZODone (DESYREL) 50 MG tablet TAKE 1 TABLET AT BEDTIME AS NEEDED FOR SLEEP   No facility-administered encounter medications on file as of 04/02/2023.    Allergies  Allergen Reactions   Asa [Aspirin]     GI upset   Niacin     hot flashes   Pravastatin     myalgias   Rosuvastatin     myalgias   Simvastatin     Myalgias     Pertinent ROS per HPI, otherwise unremarkable      Objective:  BP (!) 140/75   Pulse 82   Temp (!) 97.2 F (36.2 C) (Temporal)   Ht 5\' 6"  (1.676 m)   Wt 170 lb (77.1 kg)   SpO2 95%   BMI 27.44 kg/m    Wt Readings from Last 3 Encounters:  04/02/23 170 lb (77.1 kg)  03/26/23 165 lb (74.8 kg)  03/04/23 167 lb 6.4 oz (75.9 kg)    Physical Exam Vitals and nursing note reviewed.  Constitutional:      General: He is not in acute distress.    Appearance: Normal appearance. He is overweight. He is not ill-appearing, toxic-appearing or diaphoretic.  HENT:     Head: Normocephalic and atraumatic.     Nose: Nose normal.     Mouth/Throat:     Mouth: Mucous membranes are moist.     Pharynx: Oropharynx is clear.  Eyes:     Conjunctiva/sclera: Conjunctivae normal.     Pupils: Pupils are equal, round, and reactive to light.  Neck:     Vascular: No carotid bruit.  Cardiovascular:     Rate and Rhythm: Normal rate. Rhythm irregularly irregular.     Heart sounds: Normal heart sounds.  Pulmonary:     Effort: Pulmonary effort is normal.     Breath sounds: Normal breath sounds.  Abdominal:     General: Bowel sounds are normal.     Palpations: Abdomen is soft.  Musculoskeletal:     Cervical back: Normal range of motion and neck supple.     Right lower leg: No edema.     Left lower leg: Edema (trace) present.  Skin:    General: Skin  is warm and dry.     Capillary Refill: Capillary refill takes less than 2 seconds.  Neurological:     General: No focal deficit present.     Mental Status: He is alert and  oriented to person, place, and time.     Cranial Nerves: No cranial nerve deficit.     Sensory: No sensory deficit.     Motor: No weakness.     Gait: Gait abnormal (slow, no foot drop noted).     Deep Tendon Reflexes: Reflexes normal.  Psychiatric:        Mood and Affect: Mood normal.        Behavior: Behavior normal.        Thought Content: Thought content normal.        Judgment: Judgment normal.    Physical Exam   EXTREMITIES: Edema in left leg. MUSCULOSKELETAL: Right leg drag noted.        Results for orders placed or performed in visit on 03/21/23  POCT INR  Result Value Ref Range   INR 2.7 2.0 - 3.0   POC INR         Pertinent labs & imaging results that were available during my care of the patient were reviewed by me and considered in my medical decision making.  Assessment & Plan:  Tajae was seen today for medical management of chronic issues.  Diagnoses and all orders for this visit:  Mixed hyperlipidemia -     Lipid panel -     CMP14+EGFR  Prediabetes -     Bayer DCA Hb A1c Waived -     CMP14+EGFR  Leg cramps -     CMP14+EGFR -     Magnesium  Slow transit constipation -     polyethylene glycol powder (GLYCOLAX/MIRALAX) 17 GM/SCOOP powder; Take 17 g by mouth daily.  Unsteady gait -     CT HEAD WO CONTRAST ( ); Future  Chronic kidney disease (CKD), stage II (mild) -     CMP14+EGFR  COPD GOLD 2 with reversibility  -     Budeson-Glycopyrrol-Formoterol (BREZTRI AEROSPHERE) 160-9-4.8 MCG/ACT AERO; Inhale 2 puffs into the lungs in the morning and at bedtime.  Persistent atrial fibrillation (HCC) -     CMP14+EGFR -     Magnesium     Assessment and Plan    Unsteady Gait Reports feeling unsteady on his feet and dragging his right leg for the past 1.5-2 months. No associated dizziness or weakness. No recent falls. -Order CT head to rule out stroke. -If no improvement or if CT is negative, patient to return sooner than 6 months for further  evaluation.  Constipation Reports infrequent bowel movements (1-2 times per week) and feeling bloated. -Prescribe Miralax daily, twice daily until regular bowel movements are achieved, then reduce to daily. -Encourage increased water intake.  Leg Cramps Reports occasional leg cramps, more so when reclining. -Order CMP and Magnesium to evaluate for electrolyte imbalances that may be contributing to cramps.  Atrial Fibrillation Well controlled on Coumadin, with INR typically between 2-3. No abnormal bleeding or bruising. -Continue current management. -Discussed potential for home INR monitoring.  COPD Well controlled on Breztri. No frequent use of albuterol. -Continue current management. -Fax Breztri prescription refill request.  Hyperlipidemia On Repatha, no reported side effects. -Continue current management. -Check liver function tests to ensure no adverse effects from Repatha.  Prediabetes A1C 5.7, down from 6.2. No symptoms of hyperglycemia. -Continue diet control. -Plan for follow-up in 6 months unless  changes occur.  General Health Maintenance / Followup Plans -Plan for complete physical and labs at 80-month follow-up visit. -If any changes occur before then, patient to return sooner.          Continue all other maintenance medications.  Follow up plan: Return in about 6 months (around 10/01/2023) for CPE.   Continue healthy lifestyle choices, including diet (rich in fruits, vegetables, and lean proteins, and low in salt and simple carbohydrates) and exercise (at least 30 minutes of moderate physical activity daily).  Educational handout given for DM  The above assessment and management plan was discussed with the patient. The patient verbalized understanding of and has agreed to the management plan. Patient is aware to call the clinic if they develop any new symptoms or if symptoms persist or worsen. Patient is aware when to return to the clinic for a follow-up  visit. Patient educated on when it is appropriate to go to the emergency department.   Kari Baars, FNP-C [facility] [phone]

## 2023-04-04 ENCOUNTER — Telehealth: Payer: Self-pay | Admitting: Family Medicine

## 2023-04-04 NOTE — Telephone Encounter (Signed)
R/C to Patient - Patient is aware of Appt information.

## 2023-04-04 NOTE — Telephone Encounter (Signed)
Looked into patient chart looks like referrals called patient will route to referral department

## 2023-04-04 NOTE — Telephone Encounter (Signed)
Pt called stating that he was returning call from PCP. Says PCP called him yesterday and left message for him to call back regarding his CT scans coming up.

## 2023-04-10 LAB — SPECIMEN STATUS REPORT

## 2023-04-12 LAB — CMP14+EGFR
ALT: 9 [IU]/L (ref 0–44)
AST: 19 [IU]/L (ref 0–40)
Albumin: 4 g/dL (ref 3.7–4.7)
Alkaline Phosphatase: 71 [IU]/L (ref 44–121)
BUN/Creatinine Ratio: 11 (ref 10–24)
BUN: 13 mg/dL (ref 8–27)
Bilirubin Total: 0.3 mg/dL (ref 0.0–1.2)
CO2: 25 mmol/L (ref 20–29)
Calcium: 9 mg/dL (ref 8.6–10.2)
Chloride: 104 mmol/L (ref 96–106)
Creatinine, Ser: 1.14 mg/dL (ref 0.76–1.27)
Globulin, Total: 2.4 g/dL (ref 1.5–4.5)
Glucose: 106 mg/dL — ABNORMAL HIGH (ref 70–99)
Potassium: 4.3 mmol/L (ref 3.5–5.2)
Sodium: 144 mmol/L (ref 134–144)
Total Protein: 6.4 g/dL (ref 6.0–8.5)
eGFR: 63 mL/min/{1.73_m2} (ref >59)

## 2023-04-12 LAB — SPECIMEN STATUS REPORT

## 2023-04-12 LAB — MAGNESIUM: Magnesium: 1.9 mg/dL (ref 1.6–2.3)

## 2023-04-20 DIAGNOSIS — I6501 Occlusion and stenosis of right vertebral artery: Secondary | ICD-10-CM | POA: Diagnosis not present

## 2023-04-20 DIAGNOSIS — I69351 Hemiplegia and hemiparesis following cerebral infarction affecting right dominant side: Secondary | ICD-10-CM | POA: Diagnosis not present

## 2023-04-20 DIAGNOSIS — R109 Unspecified abdominal pain: Secondary | ICD-10-CM | POA: Diagnosis not present

## 2023-04-20 DIAGNOSIS — I700 Atherosclerosis of aorta: Secondary | ICD-10-CM | POA: Diagnosis not present

## 2023-04-20 DIAGNOSIS — I6523 Occlusion and stenosis of bilateral carotid arteries: Secondary | ICD-10-CM | POA: Diagnosis not present

## 2023-04-20 DIAGNOSIS — G3189 Other specified degenerative diseases of nervous system: Secondary | ICD-10-CM | POA: Diagnosis not present

## 2023-04-20 DIAGNOSIS — Z8673 Personal history of transient ischemic attack (TIA), and cerebral infarction without residual deficits: Secondary | ICD-10-CM | POA: Diagnosis not present

## 2023-04-20 DIAGNOSIS — I4821 Permanent atrial fibrillation: Secondary | ICD-10-CM | POA: Diagnosis not present

## 2023-04-20 DIAGNOSIS — R4781 Slurred speech: Secondary | ICD-10-CM | POA: Diagnosis not present

## 2023-04-20 DIAGNOSIS — G459 Transient cerebral ischemic attack, unspecified: Secondary | ICD-10-CM | POA: Diagnosis not present

## 2023-04-20 DIAGNOSIS — J449 Chronic obstructive pulmonary disease, unspecified: Secondary | ICD-10-CM | POA: Diagnosis not present

## 2023-04-20 DIAGNOSIS — I509 Heart failure, unspecified: Secondary | ICD-10-CM | POA: Diagnosis not present

## 2023-04-20 DIAGNOSIS — N183 Chronic kidney disease, stage 3 unspecified: Secondary | ICD-10-CM | POA: Diagnosis not present

## 2023-04-20 DIAGNOSIS — I6781 Acute cerebrovascular insufficiency: Secondary | ICD-10-CM | POA: Diagnosis not present

## 2023-04-21 DIAGNOSIS — I6522 Occlusion and stenosis of left carotid artery: Secondary | ICD-10-CM | POA: Diagnosis not present

## 2023-04-21 DIAGNOSIS — M21371 Foot drop, right foot: Secondary | ICD-10-CM | POA: Diagnosis not present

## 2023-04-21 DIAGNOSIS — I4891 Unspecified atrial fibrillation: Secondary | ICD-10-CM | POA: Diagnosis not present

## 2023-04-21 DIAGNOSIS — I6782 Cerebral ischemia: Secondary | ICD-10-CM | POA: Diagnosis not present

## 2023-04-21 DIAGNOSIS — R471 Dysarthria and anarthria: Secondary | ICD-10-CM | POA: Diagnosis not present

## 2023-04-21 DIAGNOSIS — G319 Degenerative disease of nervous system, unspecified: Secondary | ICD-10-CM | POA: Diagnosis not present

## 2023-04-21 DIAGNOSIS — R140 Abdominal distension (gaseous): Secondary | ICD-10-CM | POA: Diagnosis not present

## 2023-04-21 DIAGNOSIS — I6781 Acute cerebrovascular insufficiency: Secondary | ICD-10-CM | POA: Diagnosis not present

## 2023-04-21 DIAGNOSIS — G459 Transient cerebral ischemic attack, unspecified: Secondary | ICD-10-CM | POA: Diagnosis not present

## 2023-04-21 DIAGNOSIS — R109 Unspecified abdominal pain: Secondary | ICD-10-CM | POA: Diagnosis not present

## 2023-04-21 DIAGNOSIS — Z8673 Personal history of transient ischemic attack (TIA), and cerebral infarction without residual deficits: Secondary | ICD-10-CM | POA: Diagnosis not present

## 2023-04-21 DIAGNOSIS — I6501 Occlusion and stenosis of right vertebral artery: Secondary | ICD-10-CM | POA: Diagnosis not present

## 2023-04-21 DIAGNOSIS — I10 Essential (primary) hypertension: Secondary | ICD-10-CM | POA: Insufficient documentation

## 2023-04-21 DIAGNOSIS — Z7901 Long term (current) use of anticoagulants: Secondary | ICD-10-CM | POA: Diagnosis not present

## 2023-04-22 DIAGNOSIS — I080 Rheumatic disorders of both mitral and aortic valves: Secondary | ICD-10-CM | POA: Diagnosis not present

## 2023-04-22 DIAGNOSIS — I4891 Unspecified atrial fibrillation: Secondary | ICD-10-CM | POA: Diagnosis not present

## 2023-04-22 DIAGNOSIS — R471 Dysarthria and anarthria: Secondary | ICD-10-CM | POA: Diagnosis not present

## 2023-04-22 DIAGNOSIS — Z7901 Long term (current) use of anticoagulants: Secondary | ICD-10-CM | POA: Diagnosis not present

## 2023-04-22 DIAGNOSIS — G319 Degenerative disease of nervous system, unspecified: Secondary | ICD-10-CM | POA: Diagnosis not present

## 2023-04-22 DIAGNOSIS — I6782 Cerebral ischemia: Secondary | ICD-10-CM | POA: Diagnosis not present

## 2023-04-22 DIAGNOSIS — I6522 Occlusion and stenosis of left carotid artery: Secondary | ICD-10-CM | POA: Diagnosis not present

## 2023-04-22 DIAGNOSIS — I6501 Occlusion and stenosis of right vertebral artery: Secondary | ICD-10-CM | POA: Diagnosis not present

## 2023-04-22 DIAGNOSIS — R140 Abdominal distension (gaseous): Secondary | ICD-10-CM | POA: Diagnosis not present

## 2023-04-22 DIAGNOSIS — G459 Transient cerebral ischemic attack, unspecified: Secondary | ICD-10-CM | POA: Diagnosis not present

## 2023-04-22 DIAGNOSIS — M21371 Foot drop, right foot: Secondary | ICD-10-CM | POA: Diagnosis not present

## 2023-04-23 ENCOUNTER — Telehealth: Payer: Self-pay | Admitting: Internal Medicine

## 2023-04-23 NOTE — Telephone Encounter (Signed)
PT was at [facility]  on 11/2 and they took a CT, he said. He would like to cancel the CT WE have scheded for him because of this.  I asked if it included the chest and he said it did. Please call pt to advise this is OK. His # is [phone]  Please rev any other test that may be ordered for him (See last AVS) and cancel anything that was already done in the ER.

## 2023-04-23 NOTE — Telephone Encounter (Signed)
Please let us know if its ok to cancel

## 2023-04-30 ENCOUNTER — Ambulatory Visit (HOSPITAL_COMMUNITY)
Admission: RE | Admit: 2023-04-30 | Discharge: 2023-04-30 | Disposition: A | Discharge status: Home / Self Care | Payer: Medicare HMO | Source: Ambulatory Visit | Attending: Internal Medicine | Admitting: Internal Medicine

## 2023-04-30 ENCOUNTER — Encounter: Payer: Self-pay | Admitting: Internal Medicine

## 2023-04-30 ENCOUNTER — Ambulatory Visit: Payer: Medicare HMO | Admitting: Internal Medicine

## 2023-04-30 VITALS — BP 142/72 | HR 69 | Ht 66.0 in | Wt 168.0 lb

## 2023-04-30 DIAGNOSIS — J432 Centrilobular emphysema: Secondary | ICD-10-CM | POA: Diagnosis not present

## 2023-04-30 DIAGNOSIS — R911 Solitary pulmonary nodule: Secondary | ICD-10-CM

## 2023-04-30 DIAGNOSIS — J479 Bronchiectasis, uncomplicated: Secondary | ICD-10-CM | POA: Diagnosis not present

## 2023-04-30 DIAGNOSIS — J449 Chronic obstructive pulmonary disease, unspecified: Secondary | ICD-10-CM

## 2023-04-30 DIAGNOSIS — R918 Other nonspecific abnormal finding of lung field: Secondary | ICD-10-CM | POA: Insufficient documentation

## 2023-04-30 NOTE — Patient Instructions (Signed)
I will call you about your CT scan when available    Ok to let your PCP refill your medication if you're doing ok  - follow up here  is as needed

## 2023-04-30 NOTE — Progress Notes (Unsigned)
Subjective:   Patient ID: [name], male    DOB: 25-Feb-1941    MRN: [id number]   Brief patient profile:  64 yowm  MM/quit smoking 1991 with cough resolved then recurred in 2011 treated as pna and symptoms since then of recurrent pattern spring and fall cough assoc with nasal symptoms completely resolved  after zpak and prednisone then this episode Dec 2013 never completely resolved and had CT chest 12/09/12 with as dz so referred 12/18/2012 to pulmonary by Dr Margo Common p starting levaquin 12/15/12.  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 Assoc with mild doe but not adls  rec Finish levaquin but have inr checked on Monday July 7 Stop fish oil GERD  For cough ok to try delsym cough syrup or mucinex dm up to 1200 every 12 hours      04/13/2022  f/u ov/[place] office/[name] re: GOLD 2  maint on breztr   Chief Complaint  Patient presents with   Follow-up    Has had a cough for the last month or so. Productive with yellow and white sputum.   Dyspnea: no change mb/shop walking  Cough: mostly in am x sev minutes daily x one month Sleeping: L side flat bed no resp cc SABA use: no hfa or neb needed  02: none  Rec Prednisone 10 mg take  4 each am x 2 days,   2 each am x 2 days,  1 each am x 2 days and stop  Zpak  If cough / hoarseness continue may need to see ENT next  Please remember to go to the lab department   for your tests -  Please schedule a follow up visit in 6 months but call sooner if needed     08/31/2022  f/u ov/[place] office/[name] re: GOLD 2  maint on breztri   Chief Complaint  Patient presents with   Follow-up    PT f/u to discuss CT results, no other concerns   Dyspnea:  no change doe  Cough: p stirs am cough slt yellowish  x one minute then resolves, never bloody  Sleeping: flat bed on side one  pillow does fine s noct cough  SABA use: once every 6 weeks neb no others  02: none  Rec Symbicort 80 Take 2 puffs first thing in am and then another 2 puffs about 12 hours later.    venous dopplers ordered. Not done as of 10/22/2022    10/22/2022  f/u ov/[place] office/[name] re: GOLD 2/ spn neg pet   maint on breztri  - not breathing as well on symbicort  Chief Complaint  Patient presents with   Follow-up    Prod cough with yellow to clear sputum and mild wheezing.    Dyspnea:  no change mb/ shop  Cough: assoc with hoarseness, has not seen by ent  Sleeping: bed is flat / L side down one pillow- no cough noct, starts within a few minutes of rising daily x 02/2022  SABA use: hardly ever  Using lots of mints Rec Pantoprazole (protonix) 40 mg   Take  30-60 min before first meal of the day and Pepcid (famotidine)  20 mg after supper (or bedtime)  until return to office  . GERD diet reviewed, bed blocks rec    Please schedule a follow up  visit in 4months but call sooner if needed for ENT referral  - bring inhalers    04/30/2023  f/u ov/[place] office/[name] re: GOLD 2 COPD  maint on breztri 2bid   Chief Complaint  Patient presents with   COPD   Cough  Dyspnea:  not yet going mb / shop/ shop since admit  Cough: clear mucus / Sleeping: bed blocks/ one pillow no   resp cc  SABA use: none  02: none     No obvious day to day or daytime variability or assoc excess/ purulent sputum or mucus plugs or hemoptysis or cp or chest tightness, subjective wheeze or overt sinus or hb symptoms.    Also denies any obvious fluctuation of symptoms with weather or environmental changes or other aggravating or alleviating factors except as outlined above   No unusual exposure hx or h/o childhood pna/ asthma or knowledge of premature birth.  Current Allergies, Complete Past Medical History, Past Surgical History, Family History, and Social History were reviewed in [employer]  record.  ROS  The following are not active complaints unless bolded Hoarseness, sore throat, dysphagia, dental problems, itching, sneezing,  nasal congestion or discharge of excess mucus or purulent secretions, ear ache,   fever, chills, sweats, unintended wt loss or wt gain, classically pleuritic or exertional cp,  orthopnea pnd or arm/hand swelling  or leg swelling, presyncope, palpitations, abdominal pain, anorexia, nausea, vomiting, diarrhea  or change in bowel habits or change in bladder habits, change in stools or change in urine, dysuria, hematuria,  rash, arthralgias, visual complaints, headache, numbness, weakness or ataxia or problems with walking or coordination,  change in mood or  memory.        Current Meds  Medication Sig   albuterol (PROVENTIL) (2.5 MG/3ML) 0.083% nebulizer solution USE 1 VIAL IN NEBULIZER EVERY 6 HOURS AS NEEDED FOR WHEEZING FOR SHORTNESS OF BREATH   albuterol (VENTOLIN HFA) 108 (90 Base) MCG/ACT inhaler Inhale 2 puffs into the lungs every 6 (six) hours as needed for wheezing or shortness of breath.   atorvastatin (LIPITOR) 40 MG tablet Take 40 mg by mouth daily.   Budeson-Glycopyrrol-Formoterol (BREZTRI AEROSPHERE) 160-9-4.8 MCG/ACT AERO Inhale 2 puffs into the lungs in the morning and at bedtime.   Evolocumab (REPATHA SURECLICK) 140 MG/ML SOAJ Inject 140 mg into the skin every 14 (fourteen) days.   furosemide (LASIX) 20 MG tablet TAKE 1 TABLET DAILY AS NEEDED FOR EDEMA (SWELLING).   ketorolac (ACULAR) 0.5 % ophthalmic solution PLACE 1 DROP INTO THE LEFT EYE 4 TIMES DAILY.   metoprolol tartrate (LOPRESSOR) 25 MG tablet Take 1 tablet (25 mg total) by mouth 2 (two) times daily.   pantoprazole (PROTONIX) 40 MG tablet TAKE 1 TABLET BY MOUTH ONCE DAILY 30-60  MINUTES  BEFORE  FIRST  MEAL  OF  THE  DAY   warfarin (COUMADIN) 5 MG tablet TAKE 1/2 TO 1 TABLET BY MOUTH ONCE DAILY AS DIRECTED BY COUMADIN CLINIC               Objective:   Physical  Exam  Wt  04/30/2023      168   10/22/2022          167  08/31/2022        167   04/13/2022      167  01/19/2022          172  12/04/2021        170  09/05/2021  174  03/16/2014        174     03/09/13 179 lb (81.194 kg)  02/27/13 177 lb (80.287 kg)  01/21/13 176 lb (79.833 kg)     Vital signs reviewed  04/30/2023  - Note at rest 02 sats  95% on RA    General appearance:    amb wm nad / slt rattling cough     HEENT : Oropharynx  clear   Nasal turbinates nl    NECK :  without  apparent JVD/ palpable Nodes/TM    LUNGS: no acc muscle use,  Min barrel  contour chest wall with bilateral  slightly decreased bs s audible wheeze and  without cough on insp or exp maneuvers and min  Hyperresonant  to  percussion bilaterally    CV:  RRR  no s3 or murmur or increase in P2, and no edema   ABD:  soft and nontender with pos end  insp Hoover's  in the supine position.  No bruits or organomegaly appreciated   MS:  Nl gait/ ext warm without deformities Or obvious joint restrictions  calf tenderness, cyanosis or clubbing     SKIN: warm and dry without lesions    NEURO:  alert, approp, nl sensorium with  no motor or cerebellar deficits apparent.        Assessment & Plan:

## 2023-05-01 ENCOUNTER — Encounter: Payer: Self-pay | Admitting: Internal Medicine

## 2023-05-01 ENCOUNTER — Inpatient Hospital Stay: Payer: Medicare HMO | Admitting: Family Medicine

## 2023-05-01 NOTE — Assessment & Plan Note (Signed)
Quit smoking 1991/ MM  - PFT's 2014  FEV1 1.10 (44%) ratio 54 and 41% reversibilty  DLCO 69 corrects to 99% and 41% improvement p saba  - 09/05/2021  After extensive coaching inhaler device,  effectiveness =  80%  > breztri Take 2 puffs first thing in am and then another 2 puffs about 12 hours later. - PFT's  09/18/21  FEV1 1.49  (65 % ) ratio 0.55  p 25 % improvement from saba p ? prior to study with DLCO  14.68 (68%)   and FV curve mildly concave   - 12/04/2021  After extensive coaching inhaler device,  effectiveness =  80%  - 12/04/2021   Walked on RA  x  3  lap(s) =  approx 450  ft  @ mod pace, stopped due to end of study c/o feeling "wobbly" but no sob  with lowest 02 sats 94 % - Labs ordered 12/04/2021      alpha one AT phenotype   MM  Level 143  = 04/13/22 Eos 0.1  And IgE 175    Group D (now reclassified as E) in terms of symptom/risk and laba/lama/ICS  therefore appropriate rx at this point >>>  breztri and approp saba, no limited by doe at this point and no tendency to aecop so no change in rx.

## 2023-05-01 NOTE — Assessment & Plan Note (Signed)
Quit smoking 1991 CTa [facility] 07/18/21  Stable LUL GG nodule vs 12/2019 with rec q12 m x 3 years total -  CT chest  08/13/22 1. 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 Solid component in subsolid nodule in the LEFT upper lobe nearly 6 cm. Could consider short interval follow-up CT at 3 months or PET imaging in the short interval as well for further evaluation. 2. Multifocal nodularity and ground-glass elsewhere as discussed compatible with infectious or inflammatory changes. For this reason either CT or PET imaging should be performed after some delay, between 8 and 12 weeks, after therapy as appropriate. 3. Aortic atherosclerosis and coronary artery disease. - PET done 09/06/22 neg > final Chest CT  04/30/2023 >>>  I don't see any obvious mass in LUL but will f/u with official reading when available.   Pulmonary f/u can be prn given his geriatric decline and need for multiple other specialty f/u       Each maintenance medication was reviewed in detail including emphasizing most importantly the difference between maintenance and prns and under what circumstances the prns are to be triggered using an action plan format where appropriate.  Total time for H and P, chart review, counseling, reviewing hfa device(s) and generating customized AVS unique to this office visit / same day charting = 30 min final summary f/u ov

## 2023-05-02 ENCOUNTER — Ambulatory Visit: Payer: Medicare HMO | Attending: Internal Medicine | Admitting: *Deleted

## 2023-05-02 ENCOUNTER — Inpatient Hospital Stay: Payer: Medicare HMO

## 2023-05-02 DIAGNOSIS — Z5181 Encounter for therapeutic drug level monitoring: Secondary | ICD-10-CM | POA: Diagnosis not present

## 2023-05-02 DIAGNOSIS — I4891 Unspecified atrial fibrillation: Secondary | ICD-10-CM | POA: Diagnosis not present

## 2023-05-02 LAB — POCT INR: INR: 3.6 — AB (ref 2.0–3.0)

## 2023-05-02 NOTE — Patient Instructions (Signed)
Hold warfarin tonight then resume 1 tablet daily except 1/2 tablet on Sundays and Wednesdays Recheck in 3 wks

## 2023-05-03 ENCOUNTER — Ambulatory Visit (INDEPENDENT_AMBULATORY_CARE_PROVIDER_SITE_OTHER): Payer: Medicare HMO | Admitting: Family Medicine

## 2023-05-03 ENCOUNTER — Encounter: Payer: Self-pay | Admitting: Family Medicine

## 2023-05-03 VITALS — BP 143/65 | HR 88 | Temp 97.5°F | Ht 66.0 in | Wt 168.0 lb

## 2023-05-03 DIAGNOSIS — E782 Mixed hyperlipidemia: Secondary | ICD-10-CM | POA: Diagnosis not present

## 2023-05-03 DIAGNOSIS — G459 Transient cerebral ischemic attack, unspecified: Secondary | ICD-10-CM | POA: Diagnosis not present

## 2023-05-03 DIAGNOSIS — I4811 Longstanding persistent atrial fibrillation: Secondary | ICD-10-CM | POA: Diagnosis not present

## 2023-05-03 DIAGNOSIS — I6522 Occlusion and stenosis of left carotid artery: Secondary | ICD-10-CM | POA: Diagnosis not present

## 2023-05-03 DIAGNOSIS — E559 Vitamin D deficiency, unspecified: Secondary | ICD-10-CM | POA: Diagnosis not present

## 2023-05-03 DIAGNOSIS — R6889 Other general symptoms and signs: Secondary | ICD-10-CM | POA: Diagnosis not present

## 2023-05-03 NOTE — Progress Notes (Signed)
Subjective:  Patient ID: [name], male    DOB: 19-Jun-1940, 82 y.o.   MRN: [id number]  Patient Care Team: Sonny Masters, FNP as PCP - General (Family Medicine) Wyline Mood, Dorothe Pea, MD as PCP - Cardiology (Cardiology) Wyline Mood Dorothe Pea, MD as Consulting Physician (Cardiology) Bjorn Pippin, MD as Attending Physician (Urology) Danella Maiers, [facility] (Pharmacist)   Chief Complaint:  Hospitalization Follow-up (04/20/2023/[facility]- TIA)   HPI: [name] is a 82 y.o. male presenting on 05/03/2023 for Hospitalization Follow-up (04/20/2023/[facility]- TIA)   Discussed the use of AI scribe software for clinical note transcription with the patient, who gave verbal consent to proceed.  History of Present Illness   The patient, with a history of transient ischemic attack (TIA), atrial fibrillation (AFib), and chronic obstructive pulmonary disease (COPD), presented with a recent episode of slurred speech lasting approximately fifteen minutes. This episode led to a hospital admission where a TIA was diagnosed, attributed to a narrowed left carotid artery. The patient reported no recurrence of symptoms since discharge.  The patient's AFib is managed with metoprolol tartrate, which he reported may be causing tiredness. He also takes Coumadin, a blood thinner, which was recently adjusted due to an INR of 3.6, indicating a higher risk of bleeding. The patient also takes a statin for cholesterol management, which appears to be effective based on recent lab results.  The patient's COPD was reported as stable with no recent exacerbations. He also takes a magnesium supplement, which he questioned as a potential cause of dizziness or balance issues. The patient's medication regimen also includes famotidine and pantoprazole for stomach protection, which was initiated during his recent hospital stay.  The patient's vascular health is a concern, with a bruit noted on the  left side during physical examination.          Relevant past medical, surgical, family, and social history reviewed and updated as indicated.  Allergies and medications reviewed and updated. Data reviewed: Chart in Epic.   Past Medical History:  Diagnosis Date   Anxiety disorder    Benign prostatic hypertrophy    Bilateral cataracts    CAD (coronary artery disease)    Catheterization 2009, mild CAD   Carotid artery disease (HCC)    Doppler, September, 2011, less than 50% bilateral   COPD (chronic obstructive pulmonary disease) (HCC)    CVA (cerebral infarction)    Hospitalization.  MRI...    Left anterior cerebral artery territory, September, 2011... Coumadin started   DYSLIPIDEMIA 11/01/2009   Qualifier: Diagnosis of  By: Myrtis Ser, MD, [facility], [place]    Ejection fraction    EF 60%   Ejection fraction    EF 55-60%, echo, September, 2011   Gastroesophageal reflux disease    History of deep venous thrombosis (DVT) of distal vein of left lower extremity 08/05/2020   History of left inguinal hernia    Hyperlipidemia    Persistent atrial fibrillation (HCC)        Ptosis of eyelid, left    Surgery in the past   Sweating abnormality    Episodes of sweating appeared to be related to a delayed reaction to niacin   Vitamin D insufficiency     Past Surgical History:  Procedure Laterality Date   APPENDECTOMY     CATARACT EXTRACTION, BILATERAL     INGUINAL HERNIA REPAIR Left 07/02/2019   INGUINAL HERNIA REPAIR Left 07/01/2020   Procedure: LEFT INGUINAL HERNIA REPAIR WITH MESH;  Surgeon: Lucretia Roers, MD;  Location: [facility];  Service: General;  Laterality: Left;   KNEE SURGERY Right 2019   torn ligament?    TRANSURETHRAL RESECTION OF PROSTATE N/A 08/19/2018   Procedure: TRANSURETHRAL RESECTION OF THE PROSTATE (TURP) OF PROSTATE ABCESS;  Surgeon: Sebastian Ache, MD;  Location: [facility];  Service: Urology;  Laterality: N/A;    Social History   Socioeconomic History   Marital  status: Married    Spouse name: Aamer Barricklow   Number of children: 5   Years of education: 11   Highest education level: 11th grade  Occupational History   Occupation: Retired Biochemist, clinical   Tobacco Use   Smoking status: Former    Current packs/day: 0.00    Average packs/day: 3.0 packs/day for 50.0 years (150.0 ttl pk-yrs)    Types: Cigarettes    Start date: 03/18/1940    Quit date: 03/18/1990    Years since quitting: 33.1    Passive exposure: Past   Smokeless tobacco: Former    Types: Chew    Quit date: 06/18/2000  Vaping Use   Vaping status: Never Used  Substance and Sexual Activity   Alcohol use: No    Alcohol/week: 0.0 standard drinks of alcohol   Drug use: No   Sexual activity: Not Currently    Partners: Female  Other Topics Concern   Not on file  Social History Narrative   Quit smoking in 1993. No significant alcohol abuse. Retired from trucking.    Social Determinants of Health   Financial Resource Strain: Low Risk  (04/22/2023)   Received from [facility]   Overall Financial Resource Strain (CARDIA)    Difficulty of Paying Living Expenses: Not hard at all  Food Insecurity: No Food Insecurity (03/26/2023)   Hunger Vital Sign    Worried About Running Out of Food in the Last Year: Never true    Ran Out of Food in the Last Year: Never true  Transportation Needs: No Transportation Needs (04/22/2023)   [facility] from [facility] - Transportation    Lack of Transportation (Medical): No    Lack of Transportation (Non-Medical): No  Physical Activity: Insufficiently Active (03/26/2023)   Exercise Vital Sign    Days of Exercise per Week: 3 days    Minutes of Exercise per Session: 30 min  Stress: No Stress Concern Present (03/26/2023)   [employer] [facility] Occupational [employer] - Occupational Stress Questionnaire    Feeling of Stress : Not at all  Social Connections: Unknown (04/21/2023)   Received from [facility]   Social Network    Social Network: Not  on file  Intimate Partner Violence: Unknown (04/21/2023)   Received from [facility]   HITS    Physically Hurt: Not on file    Insult or Talk Down To: Not on file    Threaten Physical Harm: Not on file    Scream or Curse: Not on file    Outpatient Encounter Medications as of 05/03/2023  Medication Sig   albuterol (PROVENTIL) (2.5 MG/3ML) 0.083% nebulizer solution USE 1 VIAL IN NEBULIZER EVERY 6 HOURS AS NEEDED FOR WHEEZING FOR SHORTNESS OF BREATH   albuterol (VENTOLIN HFA) 108 (90 Base) MCG/ACT inhaler Inhale 2 puffs into the lungs every 6 (six) hours as needed for wheezing or shortness of breath.   aspirin EC 81 MG tablet Take 81 mg by mouth daily. Swallow whole.   atorvastatin (LIPITOR) 40 MG tablet Take 40 mg by mouth daily.  Budeson-Glycopyrrol-Formoterol (BREZTRI AEROSPHERE) 160-9-4.8 MCG/ACT AERO Inhale 2 puffs into the lungs in the morning and at bedtime.   Evolocumab (REPATHA SURECLICK) 140 MG/ML SOAJ Inject 140 mg into the skin every 14 (fourteen) days.   famotidine (PEPCID) 20 MG tablet Take 20 mg by mouth 2 (two) times daily.   furosemide (LASIX) 20 MG tablet TAKE 1 TABLET DAILY AS NEEDED FOR EDEMA (SWELLING).   ketorolac (ACULAR) 0.5 % ophthalmic solution PLACE 1 DROP INTO THE LEFT EYE 4 TIMES DAILY.   metoprolol tartrate (LOPRESSOR) 25 MG tablet Take 1 tablet (25 mg total) by mouth 2 (two) times daily. (Patient taking differently: Take 50 mg by mouth 2 (two) times daily.)   omeprazole (PRILOSEC) 20 MG capsule Take 20 mg by mouth daily.   polyethylene glycol powder (GLYCOLAX/MIRALAX) 17 GM/SCOOP powder Take 17 g by mouth daily.   warfarin (COUMADIN) 5 MG tablet TAKE 1/2 TO 1 TABLET BY MOUTH ONCE DAILY AS DIRECTED BY COUMADIN CLINIC   [DISCONTINUED] pantoprazole (PROTONIX) 40 MG tablet TAKE 1 TABLET BY MOUTH ONCE DAILY 30-60  MINUTES  BEFORE  FIRST  MEAL  OF  THE  DAY   No facility-administered encounter medications on file as of 05/03/2023.    Allergies  Allergen  Reactions   Asa [Aspirin]     GI upset   Niacin     hot flashes   Pravastatin     myalgias   Rosuvastatin     myalgias   Simvastatin     Myalgias     Pertinent ROS per HPI, otherwise unremarkable      Objective:  BP (!) 143/65   Pulse 88   Temp (!) 97.5 F (36.4 C) (Temporal)   Ht 5\' 6"  (1.676 m)   Wt 168 lb (76.2 kg)   SpO2 96%   BMI 27.12 kg/m    Wt Readings from Last 3 Encounters:  05/03/23 168 lb (76.2 kg)  04/30/23 168 lb (76.2 kg)  04/02/23 170 lb (77.1 kg)    Physical Exam Vitals and nursing note reviewed.  Constitutional:      General: He is not in acute distress.    Appearance: Normal appearance. He is well-developed and well-groomed. He is not ill-appearing, toxic-appearing or diaphoretic.  HENT:     Head: Normocephalic and atraumatic.     Jaw: There is normal jaw occlusion.     Right Ear: Hearing normal.     Left Ear: Hearing normal.     Nose: Nose normal.     Mouth/Throat:     Lips: Pink.     Mouth: Mucous membranes are moist.     Pharynx: Oropharynx is clear. Uvula midline.  Eyes:     General: Lids are normal.     Extraocular Movements: Extraocular movements intact.     Conjunctiva/sclera: Conjunctivae normal.     Pupils: Pupils are equal, round, and reactive to light.  Neck:     Thyroid: No thyroid mass, thyromegaly or thyroid tenderness.     Vascular: Carotid bruit (left) present. No JVD.     Trachea: Trachea and phonation normal.  Cardiovascular:     Rate and Rhythm: Normal rate. Rhythm irregularly irregular.     Chest Wall: PMI is not displaced.     Pulses: Normal pulses.     Heart sounds: Normal heart sounds. No murmur heard.    No friction rub. No gallop.  Pulmonary:     Effort: Pulmonary effort is normal. No respiratory distress.     Breath  sounds: Normal breath sounds. No wheezing.  Abdominal:     General: Bowel sounds are normal. There is no distension or abdominal bruit.     Palpations: Abdomen is soft. There is no  hepatomegaly or splenomegaly.     Tenderness: There is no abdominal tenderness. There is no right CVA tenderness or left CVA tenderness.     Hernia: No hernia is present.  Musculoskeletal:        General: Normal range of motion.     Cervical back: Normal range of motion and neck supple.     Right lower leg: No edema.     Left lower leg: No edema.  Lymphadenopathy:     Cervical: No cervical adenopathy.  Skin:    General: Skin is warm and dry.     Capillary Refill: Capillary refill takes less than 2 seconds.     Coloration: Skin is not cyanotic, jaundiced or pale.     Findings: No rash.  Neurological:     General: No focal deficit present.     Mental Status: He is alert and oriented to person, place, and time.     Sensory: Sensation is intact.     Motor: Motor function is intact.     Coordination: Coordination is intact.     Gait: Gait abnormal (slow).     Deep Tendon Reflexes: Reflexes are normal and symmetric.  Psychiatric:        Attention and Perception: Attention and perception normal.        Mood and Affect: Mood and affect normal.        Speech: Speech normal.        Behavior: Behavior normal. Behavior is cooperative.        Thought Content: Thought content normal.        Cognition and Memory: Cognition and memory normal.        Judgment: Judgment normal.    Physical Exam   VITALS: P- 88 CARDIOVASCULAR: Bruit on left side.        Results for orders placed or performed in visit on 05/02/23  POCT INR  Result Value Ref Range   INR 3.6 (A) 2.0 - 3.0   POC INR         Pertinent labs & imaging results that were available during my care of the patient were reviewed by me and considered in my medical decision making.  Assessment & Plan:  [name] was seen today for hospitalization follow-up.  Diagnoses and all orders for this visit:  TIA (transient ischemic attack) -     Anemia Profile B -     CMP14+EGFR -     Lipid panel -     Thyroid Panel With TSH -      Magnesium -     Ambulatory referral to Vascular Surgery  Vitamin D insufficiency -     CMP14+EGFR -     VITAMIN D 25 Hydroxy (Vit-D Deficiency, Fractures)  Mixed hyperlipidemia -     CMP14+EGFR -     Lipid panel  Longstanding persistent atrial fibrillation (HCC) -     Anemia Profile B -     CMP14+EGFR -     Lipid panel -     Thyroid Panel With TSH -     Magnesium  Stenosis of left carotid artery -     Ambulatory referral to Vascular Surgery     Assessment and Plan    Transient Ischemic Attack (TIA)   Experienced slurred speech for  15 minutes, diagnosed as TIA due to narrowed left carotid artery. Discussed risks and benefits of potential procedures. High risk associated with surgical intervention in the posterior circulation outweighs benefits. Medical management with cholesterol and anticoagulant medications is preferred.   - Refer to vascular surgeon for carotid artery evaluation   - Continue cholesterol and anticoagulant medications    Atrial Fibrillation (AFib)   AFib managed with metoprolol tartrate, which can cause fatigue. Goal is to maintain heart rate between 60-80 bpm to reduce stroke risk. Explained that controlled heart rate reduces clot formation risk.   - Continue metoprolol tartrate   - Monitor heart rate and symptoms    Anticoagulation Management   INR was 3.6, indicating over-anticoagulation. Advised to hold dose and resume regular dosing. Coumadin should be taken at night for better management.   - Continue Coumadin at night   - Monitor INR levels regularly    Hyperlipidemia   Cholesterol levels well-controlled with atorvastatin. Emphasized importance of diet and exercise in conjunction with medication. Explained that aging and diet play significant roles in cholesterol management.   - Continue atorvastatin at night   - Encourage diet and exercise modifications    Magnesium Supplementation   Magnesium levels can fluctuate, causing dizziness or balance  issues. Levels will be checked to ensure they are within the normal range.   - Check magnesium levels    Gastroesophageal Reflux Disease (GERD)   On both famotidine and pantoprazole to protect stomach lining due to new medications. Explained different mechanisms of action for each medication.   - Continue famotidine and pantoprazole as prescribed    Nicotine Patch Use   Inquired about safety of using nicotine patch with Coumadin. Confirmed to be safe.   - Continue nicotine patch if needed for withdrawal symptoms    General Health Maintenance   Advised on sodium intake and dietary modifications to manage cholesterol and overall health. Discussed moderation of high-fat foods and increasing healthy fats.   - Limit sodium intake to 2000 mg per day   - Moderate intake of high-fat foods like bacon and sausage   - Increase consumption of healthy fats such as salmon and avocado    Follow-up   - Follow up with neurology on the 19th   - Schedule follow-up with vascular surgery   - Check labs including INR and magnesium levels.          Continue all other maintenance medications.  Follow up plan: Return if symptoms worsen or fail to improve.   Continue healthy lifestyle choices, including diet (rich in fruits, vegetables, and lean proteins, and low in salt and simple carbohydrates) and exercise (at least 30 minutes of moderate physical activity daily).  Educational handout given for TIA  The above assessment and management plan was discussed with the patient. The patient verbalized understanding of and has agreed to the management plan. Patient is aware to call the clinic if they develop any new symptoms or if symptoms persist or worsen. Patient is aware when to return to the clinic for a follow-up visit. Patient educated on when it is appropriate to go to the emergency department.   Kari Baars, FNP-C [facility] [phone]

## 2023-05-03 NOTE — Telephone Encounter (Signed)
  Patient seen in office by MW on 11/12. CT Super D ordered. NFN

## 2023-05-04 LAB — CMP14+EGFR
ALT: 11 IU/L (ref 0–44)
AST: 22 IU/L (ref 0–40)
Albumin: 4 g/dL (ref 3.7–4.7)
Alkaline Phosphatase: 77 IU/L (ref 44–121)
BUN/Creatinine Ratio: 14 (ref 10–24)
BUN: 16 mg/dL (ref 8–27)
Bilirubin Total: 0.8 mg/dL (ref 0.0–1.2)
CO2: 27 mmol/L (ref 20–29)
Calcium: 9.4 mg/dL (ref 8.6–10.2)
Chloride: 104 mmol/L (ref 96–106)
Creatinine, Ser: 1.15 mg/dL (ref 0.76–1.27)
Globulin, Total: 2.6 g/dL (ref 1.5–4.5)
Glucose: 94 mg/dL (ref 70–99)
Potassium: 4.2 mmol/L (ref 3.5–5.2)
Sodium: 144 mmol/L (ref 134–144)
Total Protein: 6.6 g/dL (ref 6.0–8.5)
eGFR: 63 mL/min/{1.73_m2} (ref >59)

## 2023-05-04 LAB — ANEMIA PROFILE B
Basophils Absolute: 0 10*3/uL (ref 0.0–0.2)
Basos: 1 %
EOS (ABSOLUTE): 0.1 10*3/uL (ref 0.0–0.4)
Eos: 1 %
Ferritin: 272 ng/mL (ref 30–400)
Folate: 12.4 ng/mL (ref >3.0)
Hematocrit: 47.5 % (ref 37.5–51.0)
Hemoglobin: 15.4 g/dL (ref 13.0–17.7)
Immature Grans (Abs): 0 10*3/uL (ref 0.0–0.1)
Immature Granulocytes: 0 %
Iron Saturation: 48 % (ref 15–55)
Iron: 155 ug/dL (ref 38–169)
Lymphocytes Absolute: 1.8 10*3/uL (ref 0.7–3.1)
Lymphs: 20 %
MCH: 29.5 pg (ref 26.6–33.0)
MCHC: 32.4 g/dL (ref 31.5–35.7)
MCV: 91 fL (ref 79–97)
Monocytes Absolute: 0.6 10*3/uL (ref 0.1–0.9)
Monocytes: 7 %
Neutrophils Absolute: 6.2 10*3/uL (ref 1.4–7.0)
Neutrophils: 71 %
Platelets: 252 10*3/uL (ref 150–450)
RBC: 5.22 x10E6/uL (ref 4.14–5.80)
RDW: 12.7 % (ref 11.6–15.4)
Retic Ct Pct: 1 % (ref 0.6–2.6)
Total Iron Binding Capacity: 322 ug/dL (ref 250–450)
UIBC: 167 ug/dL (ref 111–343)
Vitamin B-12: 370 pg/mL (ref 232–1245)
WBC: 8.7 10*3/uL (ref 3.4–10.8)

## 2023-05-04 LAB — LIPID PANEL
Chol/HDL Ratio: 1.7 ratio (ref 0.0–5.0)
Cholesterol, Total: 64 mg/dL — ABNORMAL LOW (ref 100–199)
HDL: 37 mg/dL — ABNORMAL LOW (ref >39)
LDL Chol Calc (NIH): 8 mg/dL (ref 0–99)
Triglycerides: 101 mg/dL (ref 0–149)
VLDL Cholesterol Cal: 19 mg/dL (ref 5–40)

## 2023-05-04 LAB — THYROID PANEL WITH TSH
Free Thyroxine Index: 2.1 (ref 1.2–4.9)
T3 Uptake Ratio: 29 % (ref 24–39)
T4, Total: 7.1 ug/dL (ref 4.5–12.0)
TSH: 4.05 u[IU]/mL (ref 0.450–4.500)

## 2023-05-04 LAB — VITAMIN D 25 HYDROXY (VIT D DEFICIENCY, FRACTURES): Vit D, 25-Hydroxy: 30.6 ng/mL (ref 30.0–100.0)

## 2023-05-04 LAB — MAGNESIUM: Magnesium: 1.8 mg/dL (ref 1.6–2.3)

## 2023-05-07 ENCOUNTER — Other Ambulatory Visit: Payer: Self-pay | Admitting: Family Medicine

## 2023-05-07 DIAGNOSIS — Z1330 Encounter for screening examination for mental health and behavioral disorders, unspecified: Secondary | ICD-10-CM | POA: Diagnosis not present

## 2023-05-07 DIAGNOSIS — I10 Essential (primary) hypertension: Secondary | ICD-10-CM | POA: Diagnosis not present

## 2023-05-07 DIAGNOSIS — R7303 Prediabetes: Secondary | ICD-10-CM | POA: Diagnosis not present

## 2023-05-07 DIAGNOSIS — G459 Transient cerebral ischemic attack, unspecified: Secondary | ICD-10-CM | POA: Diagnosis not present

## 2023-05-07 DIAGNOSIS — E7849 Other hyperlipidemia: Secondary | ICD-10-CM | POA: Diagnosis not present

## 2023-05-07 DIAGNOSIS — I4891 Unspecified atrial fibrillation: Secondary | ICD-10-CM | POA: Diagnosis not present

## 2023-05-07 DIAGNOSIS — K5901 Slow transit constipation: Secondary | ICD-10-CM

## 2023-05-07 DIAGNOSIS — Z8673 Personal history of transient ischemic attack (TIA), and cerebral infarction without residual deficits: Secondary | ICD-10-CM | POA: Diagnosis not present

## 2023-05-07 NOTE — Telephone Encounter (Signed)
Copied from CRM [id number]. Topic: Clinical - Medication Refill >> May 07, 2023  4:39 PM Maxwell Marion wrote: Most Recent Primary Care Visit:  Provider: Sonny Masters  Department: [facility]  Visit Type: HOSPITAL FOLLOW UP  Date: 05/03/2023  Medication: polyethylene glycol powder (GLYCOLAX/MIRALAX) 17 GM/SCOOP powder  Has the patient contacted their pharmacy? Yes (Agent: If no, request that the patient contact the pharmacy for the refill. If patient does not wish to contact the pharmacy document the reason why and proceed with request.) (Agent: If yes, when and what did the pharmacy advise?) pharmacy advised pt to contact Dr. Because they don't have a prescription for it  Is this the correct pharmacy for this prescription? Yes If no, delete pharmacy and type the correct one.  This is the patient's preferred pharmacy:    [id number] - [place], [place] - [address] [place] [address] Phone: [phone] Fax: [phone]     Has the prescription been filled recently? Yes  Is the patient out of the medication? No  Has the patient been seen for an appointment in the last year OR does the patient have an upcoming appointment? Yes  Can we respond through MyChart? No  Agent: Please be advised that Rx refills may take up to 3 business days. We ask that you follow-up with your pharmacy.

## 2023-05-08 MED ORDER — POLYETHYLENE GLYCOL 3350 17 GM/SCOOP PO POWD: 17.0000 g | Freq: Every day | ORAL | 1 refills | Status: DC

## 2023-05-22 ENCOUNTER — Other Ambulatory Visit: Payer: Self-pay | Admitting: Cardiology

## 2023-05-23 ENCOUNTER — Ambulatory Visit: Payer: Medicare HMO | Attending: Cardiology | Admitting: *Deleted

## 2023-05-23 ENCOUNTER — Other Ambulatory Visit (HOSPITAL_COMMUNITY): Payer: Self-pay

## 2023-05-23 DIAGNOSIS — Z5181 Encounter for therapeutic drug level monitoring: Secondary | ICD-10-CM

## 2023-05-23 DIAGNOSIS — I4891 Unspecified atrial fibrillation: Secondary | ICD-10-CM | POA: Diagnosis not present

## 2023-05-23 DIAGNOSIS — R918 Other nonspecific abnormal finding of lung field: Secondary | ICD-10-CM

## 2023-05-23 LAB — POCT INR: INR: 3.8 — AB (ref 2.0–3.0)

## 2023-05-23 NOTE — Patient Instructions (Signed)
Hold warfarin tonight then resume 1 tablet daily except 1/2 tablet on Sundays, Tuesdays and Thursdays Recheck in 4 wks

## 2023-05-28 ENCOUNTER — Other Ambulatory Visit: Payer: Self-pay

## 2023-05-28 DIAGNOSIS — R0989 Other specified symptoms and signs involving the circulatory and respiratory systems: Secondary | ICD-10-CM

## 2023-06-03 ENCOUNTER — Telehealth: Payer: Self-pay | Admitting: Family Medicine

## 2023-06-03 DIAGNOSIS — R051 Acute cough: Secondary | ICD-10-CM | POA: Diagnosis not present

## 2023-06-03 DIAGNOSIS — J019 Acute sinusitis, unspecified: Secondary | ICD-10-CM | POA: Diagnosis not present

## 2023-06-03 NOTE — Telephone Encounter (Signed)
Left message for pt to call back to schedule an appt with DOD since PCP is off on Mondays and is booked the remainder of the week. Did not get to finish voicemail because the phone cut off.

## 2023-06-03 NOTE — Telephone Encounter (Signed)
Copied from CRM [id number]. Topic: Appointments - Appointment Scheduling >> Jun 03, 2023  8:35 AM Desma Mcgregor wrote: Patient/patient representative is calling to schedule an appointment. Pt has a cold/sneezing and requesting to come in to the office today and see Dr. Reginia Forts. Please contact patient. [phone]

## 2023-06-04 ENCOUNTER — Encounter: Payer: Medicare HMO | Admitting: Vascular Surgery

## 2023-06-21 ENCOUNTER — Ambulatory Visit: Payer: Medicare HMO | Attending: Cardiology | Admitting: *Deleted

## 2023-06-21 DIAGNOSIS — I4891 Unspecified atrial fibrillation: Secondary | ICD-10-CM | POA: Diagnosis not present

## 2023-06-21 DIAGNOSIS — Z5181 Encounter for therapeutic drug level monitoring: Secondary | ICD-10-CM

## 2023-06-21 LAB — POCT INR: INR: 4 — AB (ref 2.0–3.0)

## 2023-06-21 NOTE — Patient Instructions (Signed)
Hold warfarin tonight then decrease dose to 1/2 tablet daily except 1 tablet on Mondays and Thursdays Recheck in 3 wks

## 2023-07-05 DIAGNOSIS — Z20822 Contact with and (suspected) exposure to covid-19: Secondary | ICD-10-CM | POA: Diagnosis not present

## 2023-07-05 DIAGNOSIS — J441 Chronic obstructive pulmonary disease with (acute) exacerbation: Secondary | ICD-10-CM | POA: Diagnosis not present

## 2023-07-05 DIAGNOSIS — M549 Dorsalgia, unspecified: Secondary | ICD-10-CM | POA: Diagnosis not present

## 2023-07-05 DIAGNOSIS — R531 Weakness: Secondary | ICD-10-CM | POA: Diagnosis not present

## 2023-07-05 DIAGNOSIS — R000 Tachycardia, unspecified: Secondary | ICD-10-CM | POA: Diagnosis not present

## 2023-07-05 DIAGNOSIS — Z1152 Encounter for screening for COVID-19: Secondary | ICD-10-CM | POA: Diagnosis not present

## 2023-07-05 DIAGNOSIS — R0902 Hypoxemia: Secondary | ICD-10-CM | POA: Diagnosis not present

## 2023-07-05 DIAGNOSIS — J188 Other pneumonia, unspecified organism: Secondary | ICD-10-CM | POA: Diagnosis not present

## 2023-07-11 ENCOUNTER — Ambulatory Visit: Payer: Medicare HMO | Attending: Cardiology | Admitting: *Deleted

## 2023-07-11 DIAGNOSIS — Z5181 Encounter for therapeutic drug level monitoring: Secondary | ICD-10-CM

## 2023-07-11 DIAGNOSIS — I4891 Unspecified atrial fibrillation: Secondary | ICD-10-CM

## 2023-07-11 LAB — POCT INR: INR: 1.9 — AB (ref 2.0–3.0)

## 2023-07-11 NOTE — Patient Instructions (Signed)
Increase warfarin to 1/2 tablet daily except 1 tablet on Tuesdays, Thursdays and Saturdays Recheck in 4 wks

## 2023-07-16 ENCOUNTER — Ambulatory Visit (INDEPENDENT_AMBULATORY_CARE_PROVIDER_SITE_OTHER): Payer: Medicare HMO | Admitting: Family Medicine

## 2023-07-16 ENCOUNTER — Encounter: Payer: Self-pay | Admitting: Family Medicine

## 2023-07-16 VITALS — BP 132/69 | HR 89 | Temp 97.0°F | Ht 66.0 in | Wt 167.0 lb

## 2023-07-16 DIAGNOSIS — R5383 Other fatigue: Secondary | ICD-10-CM | POA: Diagnosis not present

## 2023-07-16 DIAGNOSIS — R531 Weakness: Secondary | ICD-10-CM | POA: Diagnosis not present

## 2023-07-16 DIAGNOSIS — R5381 Other malaise: Secondary | ICD-10-CM | POA: Diagnosis not present

## 2023-07-16 DIAGNOSIS — E538 Deficiency of other specified B group vitamins: Secondary | ICD-10-CM

## 2023-07-16 DIAGNOSIS — E559 Vitamin D deficiency, unspecified: Secondary | ICD-10-CM

## 2023-07-16 DIAGNOSIS — M21371 Foot drop, right foot: Secondary | ICD-10-CM | POA: Diagnosis not present

## 2023-07-16 DIAGNOSIS — R6889 Other general symptoms and signs: Secondary | ICD-10-CM | POA: Diagnosis not present

## 2023-07-16 NOTE — Progress Notes (Signed)
Subjective:  Patient ID: [name], male    DOB: 08/07/1940, 83 y.o.   MRN: [id number]  Patient Care Team: Sonny Masters, FNP as PCP - General (Family Medicine) Wyline Mood, Dorothe Pea, MD as PCP - Cardiology (Cardiology) Wyline Mood Dorothe Pea, MD as Consulting Physician (Cardiology) Bjorn Pippin, MD as Attending Physician (Urology) Danella Maiers, [facility] (Pharmacist)   Chief Complaint:  Hospitalization Follow-up (Pt presents today with daughter and wife. ) and energy (Low energy for 3 or 4 years. Interested in b12 shot again. Sleep is good but does get up a lot to urinate due to lasix.  )   HPI: [name] is a 83 y.o. male presenting on 07/16/2023 for Hospitalization Follow-up (Pt presents today with daughter and wife. ) and energy (Low energy for 3 or 4 years. Interested in b12 shot again. Sleep is good but does get up a lot to urinate due to lasix.  )   Discussed the use of AI scribe software for clinical note transcription with the patient, who gave verbal consent to proceed.  History of Present Illness   He is an 83 year old male with a history of pneumonia and bronchitis who presents with fatigue and weakness. He is accompanied by his daughter.  He was recently hospitalized around January 19th for confusion, attributed to pneumonia and bronchitis. Since discharge, he has experienced persistent fatigue and weakness, describing a lack of energy that has persisted for the last three to four years. He has not been walking well since returning home from the hospital, with noted weakness and lack of energy.  He has a history of low normal vitamin B12 levels and has not been taking any B12 supplements. This may contribute to his ongoing fatigue and weakness.  He experiences difficulty walking, able to walk only 15 to 20 feet before needing to stop and rest. He has been dragging his right foot for about ten years, which worsens as the day progresses. He has not seen a neurologist  specifically for this issue, although he had an MRI in 2020 and a neurology consultation at [facility] in November 2024, which did not focus on his foot drop.  He has a history of a transient ischemic attack (TIA) and COPD, which raises concerns about respiratory infections progressing to pneumonia. His last INR was 1.9, checked last Thursday.  He feels tired and has difficulty sleeping. No recent changes in memory or cognitive function.          Relevant past medical, surgical, family, and social history reviewed and updated as indicated.  Allergies and medications reviewed and updated. Data reviewed: Chart in Epic.   Past Medical History:  Diagnosis Date   Anxiety disorder    Benign prostatic hypertrophy    Bilateral cataracts    CAD (coronary artery disease)    Catheterization 2009, mild CAD   Carotid artery disease (HCC)    Doppler, September, 2011, less than 50% bilateral   COPD (chronic obstructive pulmonary disease) (HCC)    CVA (cerebral infarction)    Hospitalization.  MRI...    Left anterior cerebral artery territory, September, 2011... Coumadin started   DYSLIPIDEMIA 11/01/2009   Qualifier: Diagnosis of  By: Myrtis Ser, MD, Graciella Belton    Ejection fraction    EF 60%   Ejection fraction    EF 55-60%, echo, September, 2011   Gastroesophageal reflux disease    History of deep venous thrombosis (DVT) of distal vein of left lower  extremity 08/05/2020   History of left inguinal hernia    Hyperlipidemia    Persistent atrial fibrillation (HCC)        Ptosis of eyelid, left    Surgery in the past   Sweating abnormality    Episodes of sweating appeared to be related to a delayed reaction to niacin   Vitamin D insufficiency     Past Surgical History:  Procedure Laterality Date   APPENDECTOMY     CATARACT EXTRACTION, BILATERAL     INGUINAL HERNIA REPAIR Left 07/02/2019   INGUINAL HERNIA REPAIR Left 07/01/2020   Procedure: LEFT INGUINAL HERNIA REPAIR WITH MESH;  Surgeon:  Lucretia Roers, MD;  Location: [facility];  Service: General;  Laterality: Left;   KNEE SURGERY Right 2019   torn ligament?    TRANSURETHRAL RESECTION OF PROSTATE N/A 08/19/2018   Procedure: TRANSURETHRAL RESECTION OF THE PROSTATE (TURP) OF PROSTATE ABCESS;  Surgeon: Sebastian Ache, MD;  Location: [facility];  Service: Urology;  Laterality: N/A;    Social History   Socioeconomic History   Marital status: Married    Spouse name: [name]   Number of children: 5   Years of education: 11   Highest education level: 11th grade  Occupational History   Occupation: Retired Biochemist, clinical   Tobacco Use   Smoking status: Former    Current packs/day: 0.00    Average packs/day: 3.0 packs/day for 50.0 years (150.0 ttl pk-yrs)    Types: Cigarettes    Start date: 03/18/1940    Quit date: 03/18/1990    Years since quitting: 33.3    Passive exposure: Past   Smokeless tobacco: Former    Types: Chew    Quit date: 06/18/2000  Vaping Use   Vaping status: Never Used  Substance and Sexual Activity   Alcohol use: No    Alcohol/week: 0.0 standard drinks of alcohol   Drug use: No   Sexual activity: Not Currently    Partners: Female  Other Topics Concern   Not on file  Social History Narrative   Quit smoking in 1993. No significant alcohol abuse. Retired from trucking.    Social Drivers of Corporate investment banker Strain: Low Risk  (05/07/2023)   Received from [facility]   Overall Financial Resource Strain (CARDIA)    Difficulty of Paying Living Expenses: Not hard at all  Food Insecurity: No Food Insecurity (05/07/2023)   [facility]   Hunger Vital Sign    Worried About Running Out of Food in the Last Year: Never true    Ran Out of Food in the Last Year: Never true  Transportation Needs: No Transportation Needs (05/07/2023)   Received from [facility] - Transportation    Lack of Transportation (Medical): No    Lack of Transportation (Non-Medical): No   Physical Activity: Insufficiently Active (03/26/2023)   Exercise Vital Sign    Days of Exercise per Week: 3 days    Minutes of Exercise per Session: 30 min  Stress: No Stress Concern Present (03/26/2023)   [employer] of Occupational Health - Occupational Stress Questionnaire    Feeling of Stress : Not at all  Social Connections: Unknown (04/21/2023)   Received from [facility]   Social Network    Social Network: Not on file  Intimate Partner Violence: Unknown (04/21/2023)   Received from [facility]   HITS    Physically Hurt: Not on file    Insult or Talk Down To:  Not on file    Threaten Physical Harm: Not on file    Scream or Curse: Not on file    Outpatient Encounter Medications as of 07/16/2023  Medication Sig   albuterol (PROVENTIL) (2.5 MG/3ML) 0.083% nebulizer solution USE 1 VIAL IN NEBULIZER EVERY 6 HOURS AS NEEDED FOR WHEEZING FOR SHORTNESS OF BREATH   albuterol (VENTOLIN HFA) 108 (90 Base) MCG/ACT inhaler Inhale 2 puffs into the lungs every 6 (six) hours as needed for wheezing or shortness of breath.   aspirin EC 81 MG tablet Take 81 mg by mouth daily. Swallow whole.   Budeson-Glycopyrrol-Formoterol (BREZTRI AEROSPHERE) 160-9-4.8 MCG/ACT AERO Inhale 2 puffs into the lungs in the morning and at bedtime.   Evolocumab (REPATHA SURECLICK) 140 MG/ML SOAJ Inject 140 mg into the skin every 14 (fourteen) days.   furosemide (LASIX) 20 MG tablet TAKE 1 TABLET DAILY AS NEEDED FOR EDEMA (SWELLING).   ketorolac (ACULAR) 0.5 % ophthalmic solution PLACE 1 DROP INTO THE LEFT EYE 4 TIMES DAILY.   metoprolol tartrate (LOPRESSOR) 25 MG tablet TAKE 1 TABLET TWICE DAILY   omeprazole (PRILOSEC) 20 MG capsule Take 20 mg by mouth daily.   warfarin (COUMADIN) 5 MG tablet TAKE 1/2 TO 1 TABLET BY MOUTH ONCE DAILY AS DIRECTED BY COUMADIN CLINIC   atorvastatin (LIPITOR) 40 MG tablet Take 40 mg by mouth daily. (Patient not taking: Reported on 07/16/2023)   famotidine (PEPCID) 20 MG tablet Take 20  mg by mouth 2 (two) times daily. (Patient not taking: Reported on 07/16/2023)   polyethylene glycol powder (GLYCOLAX/MIRALAX) 17 GM/SCOOP powder Take 17 g by mouth daily. (Patient not taking: Reported on 07/16/2023)   No facility-administered encounter medications on file as of 07/16/2023.    Allergies  Allergen Reactions   Asa [Aspirin]     GI upset   Niacin     hot flashes   Pravastatin     myalgias   Rosuvastatin     myalgias   Simvastatin     Myalgias     Pertinent ROS per HPI, otherwise unremarkable      Objective:  BP 132/69   Pulse 89   Temp (!) 97 F (36.1 C)   Ht 5\' 6"  (1.676 m)   Wt 167 lb (75.8 kg)   SpO2 97%   BMI 26.95 kg/m    Wt Readings from Last 3 Encounters:  07/16/23 167 lb (75.8 kg)  05/03/23 168 lb (76.2 kg)  04/30/23 168 lb (76.2 kg)    Physical Exam Vitals and nursing note reviewed.  Constitutional:      Appearance: Normal appearance.  HENT:     Head: Normocephalic and atraumatic.     Nose: Nose normal.     Mouth/Throat:     Mouth: Mucous membranes are moist.     Pharynx: Oropharynx is clear.  Eyes:     Extraocular Movements: Extraocular movements intact.     Conjunctiva/sclera: Conjunctivae normal.     Pupils: Pupils are equal, round, and reactive to light.     Comments: Left lid ptosis   Cardiovascular:     Rate and Rhythm: Normal rate. Rhythm irregularly irregular.  Pulmonary:     Effort: Pulmonary effort is normal.     Breath sounds: Rhonchi (clear with cough) present.  Musculoskeletal:     Cervical back: Neck supple.     Right lower leg: No edema.     Left lower leg: No edema.  Skin:    General: Skin is warm and dry.  Capillary Refill: Capillary refill takes less than 2 seconds.  Neurological:     Mental Status: He is alert.     Gait: Gait abnormal (slow).  Psychiatric:        Mood and Affect: Mood normal.        Behavior: Behavior normal.        Thought Content: Thought content normal.        Judgment: Judgment  normal.     Results for orders placed or performed in visit on 07/11/23  POCT INR   Collection Time: 07/11/23  8:16 AM  Result Value Ref Range   INR 1.9 (A) 2.0 - 3.0   POC INR         Pertinent labs & imaging results that were available during my care of the patient were reviewed by me and considered in my medical decision making.  Assessment & Plan:  Andoni was seen today for hospitalization follow-up and energy.  Diagnoses and all orders for this visit:  Malaise and fatigue -     Anemia Profile B -     CMP14+EGFR -     Thyroid Panel With TSH -     VITAMIN D 25 Hydroxy (Vit-D Deficiency, Fractures)  Right foot drop -     Ambulatory referral to Neurology -     Anemia Profile B -     CMP14+EGFR  Vitamin D insufficiency -     Anemia Profile B -     CMP14+EGFR -     VITAMIN D 25 Hydroxy (Vit-D Deficiency, Fractures)  Vitamin B 12 deficiency -     Anemia Profile B  General weakness -     Anemia Profile B -     CMP14+EGFR -     Thyroid Panel With TSH -     VITAMIN D 25 Hydroxy (Vit-D Deficiency, Fractures)     Assessment and Plan    Pneumonia and Bronchitis Recent hospitalization for pneumonia and bronchitis with symptoms of confusion, fatigue, and weakness. Persistent fatigue and weakness since discharge. Emphasized the importance of follow-up care and monitoring for recurrence or complications. - Order lab work including anemia profile, iron counts, ferritin, and kidney function - Check vitamin B12 and D levels  Foot Drop Chronic right foot drop for approximately 10 years, worsening throughout the day. No prior neurology evaluation. Symptoms improve in the morning. Discussed potential benefits of physical and occupational therapy. Referral to neurology for further evaluation. - Refer to neurology for evaluation - Refer to physical and occupational therapy for strength and mobility improvement  Vitamin B12 Deficiency Low normal B12 levels with symptoms of  fatigue and weakness. No current B12 supplementation. Discussed potential benefits of supplementation and plan to check levels today. Consider monthly B12 injections if levels are low. - Check vitamin B12 levels - Consider monthly B12 injections if levels are low  Vitamin D Deficiency Low vitamin D levels with no current supplementation. Plan to check levels today and prescribe supplementation based on results. If levels are below 20, prescribe once weekly; if above 20, prescribe daily. - Check vitamin D levels - Prescribe vitamin D supplementation based on levels: once weekly if below 20, daily if above 20  General Health Maintenance Discussed the importance of maintaining activity levels to prevent decline in mobility and strength. Encouraged use of a cane if needed to prevent falls. Recommended a multivitamin such as Centrum Silver to support overall health. - Encourage daily physical activity - Recommend a multivitamin such as Centrum  Silver - Discuss the importance of using a cane if feeling weak to prevent falls  Follow-up - Neurology to reach out within the next week - Follow-up on lab results and adjust treatment as necessary.        Total time spent with patient 50 minutes.  Greater than 50% of encounter spent in coordination of care/counseling.   Continue all other maintenance medications.  Follow up plan: Return if symptoms worsen or fail to improve.   Continue healthy lifestyle choices, including diet (rich in fruits, vegetables, and lean proteins, and low in salt and simple carbohydrates) and exercise (at least 30 minutes of moderate physical activity daily).   The above assessment and management plan was discussed with the patient. The patient verbalized understanding of and has agreed to the management plan. Patient is aware to call the clinic if they develop any new symptoms or if symptoms persist or worsen. Patient is aware when to return to the clinic for a  follow-up visit. Patient educated on when it is appropriate to go to the emergency department.   Kari Baars, FNP-C [facility] [phone]

## 2023-07-16 NOTE — Patient Instructions (Addendum)
[place]

## 2023-07-17 LAB — ANEMIA PROFILE B
Basophils Absolute: 0 10*3/uL (ref 0.0–0.2)
Basos: 0 %
EOS (ABSOLUTE): 0.1 10*3/uL (ref 0.0–0.4)
Eos: 1 %
Ferritin: 355 ng/mL (ref 30–400)
Folate: 6.5 ng/mL (ref >3.0)
Hematocrit: 44 % (ref 37.5–51.0)
Hemoglobin: 14.2 g/dL (ref 13.0–17.7)
Immature Grans (Abs): 0 10*3/uL (ref 0.0–0.1)
Immature Granulocytes: 0 %
Iron Saturation: 29 % (ref 15–55)
Iron: 77 ug/dL (ref 38–169)
Lymphocytes Absolute: 2.1 10*3/uL (ref 0.7–3.1)
Lymphs: 20 %
MCH: 29.2 pg (ref 26.6–33.0)
MCHC: 32.3 g/dL (ref 31.5–35.7)
MCV: 91 fL (ref 79–97)
Monocytes Absolute: 0.8 10*3/uL (ref 0.1–0.9)
Monocytes: 8 %
Neutrophils Absolute: 7.8 10*3/uL — ABNORMAL HIGH (ref 1.4–7.0)
Neutrophils: 71 %
Platelets: 235 10*3/uL (ref 150–450)
RBC: 4.86 x10E6/uL (ref 4.14–5.80)
RDW: 12.8 % (ref 11.6–15.4)
Retic Ct Pct: 1 % (ref 0.6–2.6)
Total Iron Binding Capacity: 268 ug/dL (ref 250–450)
UIBC: 191 ug/dL (ref 111–343)
Vitamin B-12: 299 pg/mL (ref 232–1245)
WBC: 11 10*3/uL — ABNORMAL HIGH (ref 3.4–10.8)

## 2023-07-17 LAB — CMP14+EGFR
ALT: 9 [IU]/L (ref 0–44)
AST: 16 [IU]/L (ref 0–40)
Albumin: 3.5 g/dL — ABNORMAL LOW (ref 3.7–4.7)
Alkaline Phosphatase: 67 [IU]/L (ref 44–121)
BUN/Creatinine Ratio: 14 (ref 10–24)
BUN: 14 mg/dL (ref 8–27)
Bilirubin Total: 0.5 mg/dL (ref 0.0–1.2)
CO2: 25 mmol/L (ref 20–29)
Calcium: 8.6 mg/dL (ref 8.6–10.2)
Chloride: 100 mmol/L (ref 96–106)
Creatinine, Ser: 1.03 mg/dL (ref 0.76–1.27)
Globulin, Total: 2.6 g/dL (ref 1.5–4.5)
Glucose: 105 mg/dL — ABNORMAL HIGH (ref 70–99)
Potassium: 3.9 mmol/L (ref 3.5–5.2)
Sodium: 141 mmol/L (ref 134–144)
Total Protein: 6.1 g/dL (ref 6.0–8.5)
eGFR: 72 mL/min/{1.73_m2} (ref >59)

## 2023-07-17 LAB — THYROID PANEL WITH TSH
Free Thyroxine Index: 1.9 (ref 1.2–4.9)
T3 Uptake Ratio: 32 % (ref 24–39)
T4, Total: 6 ug/dL (ref 4.5–12.0)
TSH: 3.73 u[IU]/mL (ref 0.450–4.500)

## 2023-07-17 LAB — VITAMIN D 25 HYDROXY (VIT D DEFICIENCY, FRACTURES): Vit D, 25-Hydroxy: 26.9 ng/mL — ABNORMAL LOW (ref 30.0–100.0)

## 2023-07-19 ENCOUNTER — Ambulatory Visit: Payer: Medicare HMO | Admitting: Urgent Care

## 2023-07-19 ENCOUNTER — Ambulatory Visit: Payer: Self-pay | Admitting: Family Medicine

## 2023-07-19 DIAGNOSIS — J441 Chronic obstructive pulmonary disease with (acute) exacerbation: Secondary | ICD-10-CM | POA: Diagnosis not present

## 2023-07-19 DIAGNOSIS — M549 Dorsalgia, unspecified: Secondary | ICD-10-CM | POA: Diagnosis not present

## 2023-07-19 DIAGNOSIS — Z87891 Personal history of nicotine dependence: Secondary | ICD-10-CM | POA: Diagnosis not present

## 2023-07-19 DIAGNOSIS — Z886 Allergy status to analgesic agent status: Secondary | ICD-10-CM | POA: Diagnosis not present

## 2023-07-19 DIAGNOSIS — I4891 Unspecified atrial fibrillation: Secondary | ICD-10-CM | POA: Diagnosis not present

## 2023-07-19 DIAGNOSIS — Z043 Encounter for examination and observation following other accident: Secondary | ICD-10-CM | POA: Diagnosis not present

## 2023-07-19 DIAGNOSIS — M546 Pain in thoracic spine: Secondary | ICD-10-CM | POA: Diagnosis not present

## 2023-07-19 DIAGNOSIS — N400 Enlarged prostate without lower urinary tract symptoms: Secondary | ICD-10-CM | POA: Diagnosis not present

## 2023-07-19 DIAGNOSIS — N183 Chronic kidney disease, stage 3 unspecified: Secondary | ICD-10-CM | POA: Diagnosis not present

## 2023-07-19 DIAGNOSIS — K219 Gastro-esophageal reflux disease without esophagitis: Secondary | ICD-10-CM | POA: Diagnosis not present

## 2023-07-19 DIAGNOSIS — W1839XA Other fall on same level, initial encounter: Secondary | ICD-10-CM | POA: Diagnosis not present

## 2023-07-19 DIAGNOSIS — E7800 Pure hypercholesterolemia, unspecified: Secondary | ICD-10-CM | POA: Diagnosis not present

## 2023-07-19 DIAGNOSIS — M47814 Spondylosis without myelopathy or radiculopathy, thoracic region: Secondary | ICD-10-CM | POA: Diagnosis not present

## 2023-07-19 NOTE — Telephone Encounter (Signed)
Copied from CRM [id number]. Topic: Clinical - Red Word Triage >> Jul 19, 2023  1:58 PM [name] wrote: Red Word that prompted transfer to Nurse Triage: [name], pulled muscles very painful.   Chief Complaint: lower back pain due to fall  Symptoms: 7-8/10 pain unrelieved by otc medicine  Frequency: ongoing since Tuesday Pertinent Negatives: Patient denies weakness or numbness in extremities  Disposition: [] ED /[] Urgent Care (no appt availability in office) / [x] Appointment(In office/virtual)/ []  [place] [facility]/ [] Home Care/ [] Refused Recommended Disposition /[] [place] [facility]/ []  Follow-up with PCP Additional Notes: The patient and his son called to report that the patient fell on Tuesday.  He and his wife were walking and she was falling and he tried to catch her and fell on his back on the concrete.  The paramedics were called and the patient declined going to the ED.  They assessed him and determine that he did not have any bruises, cuts, or broken bones.  He is able to ambulate but he is having 7-8/10 pain in his lower back unrelieved by over the counter pain medicine.  He has been laying in bed resting and is moving slowly.  He was scheduled for a same day appointment to be seen at a different doctor's office as his pcp does not have same day availability.    Reason for Disposition  [1] SEVERE pain (e.g., excruciating) AND [2] not improved 2 hours after pain medicine/ice packs  Answer Assessment - Initial Assessment Questions 1. MECHANISM: "How did the injury happen?" (Consider the possibility of domestic violence or elder abuse)     [name] backwards on concrete; tried to catch his wife from falling 2. ONSET: "When did the injury happen?" (Minutes or hours ago)     Tuesday 3. LOCATION: "What part of the back is injured?"     Lower back  4. SEVERITY: "Can you move the back normally?"     Hurts more with movement - standing rolling over  5. PAIN: "Is there any pain?" If Yes, ask:  "How bad is the pain?"   (Scale 1-10; or mild, moderate, severe)     7-8/10 6. CORD SYMPTOMS: Any weakness or numbness of the arms or legs?"     none 7. SIZE: For cuts, bruises, or swelling, ask: "How large is it?" (e.g., inches or centimeters)     No bruising or cuts 9. OTHER SYMPTOMS: "Do you have any other symptoms?" (e.g., abdomen pain, blood in urine)     None  Protocols used: Back Injury-A-AH

## 2023-07-21 ENCOUNTER — Other Ambulatory Visit: Payer: Self-pay | Admitting: Cardiology

## 2023-07-21 DIAGNOSIS — E782 Mixed hyperlipidemia: Secondary | ICD-10-CM

## 2023-07-22 ENCOUNTER — Telehealth: Payer: Self-pay | Admitting: Family Medicine

## 2023-07-22 ENCOUNTER — Ambulatory Visit: Payer: Medicare HMO

## 2023-07-22 NOTE — Telephone Encounter (Signed)
Copied from CRM [id number]. Topic: General - Other >> Jul 22, 2023  9:37 AM Carlatta H wrote: Reason for CRM: Patients son Minerva Areola would like a call back at [phone] about FMLA paper for hisself and how to move forward with father because he was discharged from the hospital on 1/31 for a fall//

## 2023-07-22 NOTE — Telephone Encounter (Signed)
 Please review

## 2023-07-22 NOTE — Telephone Encounter (Signed)
NTBS for ED FU then we can fill out FMLA paperwork, appt made for 07/23/23

## 2023-07-23 ENCOUNTER — Ambulatory Visit: Payer: Medicare Other | Admitting: Family Medicine

## 2023-07-23 ENCOUNTER — Encounter: Payer: Self-pay | Admitting: Family Medicine

## 2023-07-23 VITALS — BP 126/56 | HR 96 | Temp 97.5°F | Ht 66.0 in | Wt 161.4 lb

## 2023-07-23 DIAGNOSIS — W19XXXD Unspecified fall, subsequent encounter: Secondary | ICD-10-CM

## 2023-07-23 DIAGNOSIS — E538 Deficiency of other specified B group vitamins: Secondary | ICD-10-CM | POA: Diagnosis not present

## 2023-07-23 DIAGNOSIS — M546 Pain in thoracic spine: Secondary | ICD-10-CM

## 2023-07-23 DIAGNOSIS — M545 Low back pain, unspecified: Secondary | ICD-10-CM

## 2023-07-23 MED ORDER — CYANOCOBALAMIN 1000 MCG/ML IJ SOLN
1000.0000 ug | INTRAMUSCULAR | Status: AC
  Administered 2023-07-23 – 2023-11-08 (×4): 1000 ug via INTRAMUSCULAR

## 2023-07-23 MED ORDER — TRAMADOL HCL 50 MG PO TABS: 50.0000 mg | ORAL_TABLET | Freq: Three times a day (TID) | ORAL | 0 refills | Status: AC | PRN

## 2023-07-23 NOTE — Progress Notes (Signed)
 Subjective:  Patient ID: [name], male    DOB: Aug 05, 1940, 83 y.o.   MRN: [id number]  Patient Care Team: Severa Rock CHRISTELLA, FNP as PCP - General (Family Medicine) Alvan, Dorn FALCON, MD as PCP - Cardiology (Cardiology) Alvan Dorn FALCON, MD as Consulting Physician (Cardiology) Watt Rush, MD as Attending Physician (Urology) Billee Mliss BIRCH, [facility] (Pharmacist)   Chief Complaint:  Follow-up [name] to ER a few days after fall)   HPI: [name] is a 83 y.o. male presenting on 07/23/2023 for Follow-up [name] to ER a few days after fall)   Discussed the use of AI scribe software for clinical note transcription with the patient, who gave verbal consent to proceed.  History of Present Illness   [name] is an 83 year old male with significant spondylosis who presents with severe back pain following a fall.  He has severe back pain following a fall, describing it as so severe that he can hardly move and has been mostly bedridden since the incident. The fall occurred after he tripped, hitting his head and back, but he did not lose consciousness. No loss of bowel or bladder function. X-rays were previously done and appeared normal.  He has a history of significant spondylosis, characterized by arthritic changes and narrowing of the spine. He experienced similar back issues in 1972, which resolved over a few months. The current pain is located in the middle to lower back and is described as shooting pain, especially when coughing or lying on his side.  He was discharged from the hospital with pain medications, which he finished the day before yesterday. He has been taking Tylenol  at home, sometimes exceeding the recommended dose, but finds it only partially effective. No constipation, though he notes infrequent bowel movements, sometimes going a week or more without a bowel movement.  He has been receiving Repatha  for free through assistance programs managed by his cardiologist.  Recently, he encountered an issue where the medication was priced at $2000, and he is unsure why the assistance is no longer in place. His insurance changed on February 1st, which may have affected referrals and access to services.          Relevant past medical, surgical, family, and social history reviewed and updated as indicated.  Allergies and medications reviewed and updated. Data reviewed: Chart in Epic.   Past Medical History:  Diagnosis Date   Anxiety disorder    Benign prostatic hypertrophy    Bilateral cataracts    CAD (coronary artery disease)    Catheterization 2009, mild CAD   Carotid artery disease (HCC)    Doppler, September, 2011, less than 50% bilateral   COPD (chronic obstructive pulmonary disease) (HCC)    CVA (cerebral infarction)    Hospitalization.  MRI...    Left anterior cerebral artery territory, September, 2011... Coumadin  started   DYSLIPIDEMIA 11/01/2009   Qualifier: Diagnosis of  By: Micky, MD, [facility], [place] Lenis    Ejection fraction    EF 60%   Ejection fraction    EF 55-60%, echo, September, 2011   Gastroesophageal reflux disease    History of deep venous thrombosis (DVT) of distal vein of left lower extremity 08/05/2020   History of left inguinal hernia    Hyperlipidemia    Persistent atrial fibrillation (HCC)        Ptosis of eyelid, left    Surgery in the past   Sweating abnormality    Episodes of sweating appeared  to be related to a delayed reaction to niacin   Vitamin D  insufficiency     Past Surgical History:  Procedure Laterality Date   APPENDECTOMY     CATARACT EXTRACTION, BILATERAL     INGUINAL HERNIA REPAIR Left 07/02/2019   INGUINAL HERNIA REPAIR Left 07/01/2020   Procedure: LEFT INGUINAL HERNIA REPAIR WITH MESH;  Surgeon: Kallie Manuelita BROCKS, MD;  Location: [facility];  Service: General;  Laterality: Left;   KNEE SURGERY Right 2019   torn ligament?    TRANSURETHRAL RESECTION OF PROSTATE N/A 08/19/2018   Procedure: TRANSURETHRAL  RESECTION OF THE PROSTATE (TURP) OF PROSTATE ABCESS;  Surgeon: Alvaro Hummer, MD;  Location: [facility];  Service: Urology;  Laterality: N/A;    Social History   Socioeconomic History   Marital status: Married    Spouse name: Kolbey Teichert   Number of children: 5   Years of education: 11   Highest education level: 11th grade  Occupational History   Occupation: Retired Biochemist, Clinical   Tobacco Use   Smoking status: Former    Current packs/day: 0.00    Average packs/day: 3.0 packs/day for 50.0 years (150.0 ttl pk-yrs)    Types: Cigarettes    Start date: 03/18/1940    Quit date: 03/18/1990    Years since quitting: 33.3    Passive exposure: Past   Smokeless tobacco: Former    Types: Chew    Quit date: 06/18/2000  Vaping Use   Vaping status: Never Used  Substance and Sexual Activity   Alcohol  use: No    Alcohol /week: 0.0 standard drinks of alcohol    Drug use: No   Sexual activity: Not Currently    Partners: Female  Other Topics Concern   Not on file  Social History Narrative   Quit smoking in 1993. No significant alcohol  abuse. Retired from trucking.    Social Drivers of Corporate Investment Banker Strain: Low Risk  (05/07/2023)   Received from [facility]   Overall Financial Resource Strain (CARDIA)    Difficulty of Paying Living Expenses: Not hard at all  Food Insecurity: No Food Insecurity (05/07/2023)   Received from [facility]   Hunger Vital Sign    Worried About Running Out of Food in the Last Year: Never true    Ran Out of Food in the Last Year: Never true  Transportation Needs: No Transportation Needs (05/07/2023)   Received from [facility] - Transportation    Lack of Transportation (Medical): No    Lack of Transportation (Non-Medical): No  Physical Activity: Insufficiently Active (03/26/2023)   Exercise Vital Sign    Days of Exercise per Week: 3 days    Minutes of Exercise per Session: 30 min  Stress: No Stress Concern Present (03/26/2023)    [employer] [facility] [employer] - Occupational Stress Questionnaire    Feeling of Stress : Not at all  Social Connections: Unknown (04/21/2023)   Received from [facility]   Social Network    Social Network: Not on file  Intimate Partner Violence: Unknown (04/21/2023)   Received from [facility]   HITS    Physically Hurt: Not on file    Insult or Talk Down To: Not on file    Threaten Physical Harm: Not on file    Scream or Curse: Not on file    Outpatient Encounter Medications as of 07/23/2023  Medication Sig   albuterol  (PROVENTIL ) (2.5 MG/3ML) 0.083% nebulizer solution USE 1 VIAL IN NEBULIZER EVERY 6  HOURS AS NEEDED FOR WHEEZING FOR SHORTNESS OF BREATH   albuterol  (VENTOLIN  HFA) 108 (90 Base) MCG/ACT inhaler Inhale 2 puffs into the lungs every 6 (six) hours as needed for wheezing or shortness of breath.   aspirin  EC 81 MG tablet Take 81 mg by mouth daily. Swallow whole.   Budeson-Glycopyrrol-Formoterol  (BREZTRI  AEROSPHERE) 160-9-4.8 MCG/ACT AERO Inhale 2 puffs into the lungs in the morning and at bedtime.   furosemide  (LASIX ) 20 MG tablet TAKE 1 TABLET DAILY AS NEEDED FOR EDEMA (SWELLING).   ketorolac  (ACULAR ) 0.5 % ophthalmic solution PLACE 1 DROP INTO THE LEFT EYE 4 TIMES DAILY.   metoprolol  tartrate (LOPRESSOR ) 25 MG tablet TAKE 1 TABLET TWICE DAILY   omeprazole  (PRILOSEC) 20 MG capsule Take 20 mg by mouth daily.   REPATHA  SURECLICK 140 MG/ML SOAJ INJECT 140 MG SUBCUTANEOUSLY ONCE EVERY 14 DAYS   traMADol  (ULTRAM ) 50 MG tablet Take 1 tablet (50 mg total) by mouth every 8 (eight) hours as needed for up to 5 days.   warfarin (COUMADIN ) 5 MG tablet TAKE 1/2 TO 1 TABLET BY MOUTH ONCE DAILY AS DIRECTED BY COUMADIN  CLINIC   atorvastatin (LIPITOR) 40 MG tablet Take 40 mg by mouth daily. (Patient not taking: Reported on 07/23/2023)   famotidine  (PEPCID ) 20 MG tablet Take 20 mg by mouth 2 (two) times daily. (Patient not taking: Reported on 07/23/2023)   polyethylene glycol powder  (GLYCOLAX /MIRALAX ) 17 GM/SCOOP powder Take 17 g by mouth daily. (Patient not taking: Reported on 07/23/2023)   Facility-Administered Encounter Medications as of 07/23/2023  Medication   cyanocobalamin  (VITAMIN B12) injection 1,000 mcg    Allergies  Allergen Reactions   Asa [Aspirin ]     GI upset   Niacin     hot flashes   Pravastatin      myalgias   Rosuvastatin      myalgias   Simvastatin      Myalgias     Pertinent ROS per HPI, otherwise unremarkable      Objective:  BP (!) 126/56   Pulse 96   Temp (!) 97.5 F (36.4 C) (Temporal)   Ht 5' 6 (1.676 m)   Wt 161 lb 6.4 oz (73.2 kg)   SpO2 96%   BMI 26.05 kg/m    Wt Readings from Last 3 Encounters:  07/23/23 161 lb 6.4 oz (73.2 kg)  07/16/23 167 lb (75.8 kg)  05/03/23 168 lb (76.2 kg)    Physical Exam Vitals and nursing note reviewed.  Constitutional:      General: He is not in acute distress.    Appearance: He is not ill-appearing, toxic-appearing or diaphoretic.  HENT:     Head: Normocephalic and atraumatic.     Nose: Nose normal.     Mouth/Throat:     Mouth: Mucous membranes are moist.  Eyes:     Conjunctiva/sclera: Conjunctivae normal.     Pupils: Pupils are equal, round, and reactive to light.     Comments: Lid ptosis - chronic  Cardiovascular:     Rate and Rhythm: Normal rate. Rhythm irregularly irregular.     Heart sounds: Normal heart sounds.  Pulmonary:     Effort: Pulmonary effort is normal.     Breath sounds: Normal breath sounds.  Musculoskeletal:     Cervical back: Normal and neck supple.     Thoracic back: Tenderness present. No swelling, edema, deformity, signs of trauma, lacerations, spasms or bony tenderness. Decreased range of motion. No scoliosis.     Lumbar back: Tenderness present. No swelling,  edema, deformity, signs of trauma, lacerations, spasms or bony tenderness. Decreased range of motion. Negative right straight leg raise test and negative left straight leg raise test. No scoliosis.      Right hip: Normal.     Left hip: Normal.  Skin:    General: Skin is warm and dry.     Capillary Refill: Capillary refill takes less than 2 seconds.  Neurological:     General: No focal deficit present.     Mental Status: He is alert and oriented to person, place, and time.     Gait: Gait abnormal (in wheelchair).  Psychiatric:        Mood and Affect: Mood normal.        Behavior: Behavior normal.        Thought Content: Thought content normal.        Judgment: Judgment normal.    Physical Exam   MUSCULOSKELETAL: Tenderness in thoracic and lumbar spine upon palpation.        Results for orders placed or performed in visit on 07/16/23  Anemia Profile B   Collection Time: 07/16/23 12:26 PM  Result Value Ref Range   Total Iron Binding Capacity 268 250 - 450 ug/dL   UIBC 808 888 - 656 ug/dL   Iron 77 38 - 830 ug/dL   Iron Saturation 29 15 - 55 %   Ferritin 355 30 - 400 ng/mL   Vitamin B-12 299 232 - 1,245 pg/mL   Folate 6.5 >3.0 ng/mL   WBC 11.0 (H) 3.4 - 10.8 x10E3/uL   RBC 4.86 4.14 - 5.80 x10E6/uL   Hemoglobin 14.2 13.0 - 17.7 g/dL   Hematocrit 55.9 62.4 - 51.0 %   MCV 91 79 - 97 fL   MCH 29.2 26.6 - 33.0 pg   MCHC 32.3 31.5 - 35.7 g/dL   RDW 87.1 88.3 - 84.5 %   Platelets 235 150 - 450 x10E3/uL   Neutrophils 71 Not Estab. %   Lymphs 20 Not Estab. %   Monocytes 8 Not Estab. %   Eos 1 Not Estab. %   Basos 0 Not Estab. %   Neutrophils Absolute 7.8 (H) 1.4 - 7.0 x10E3/uL   Lymphocytes Absolute 2.1 0.7 - 3.1 x10E3/uL   Monocytes Absolute 0.8 0.1 - 0.9 x10E3/uL   EOS (ABSOLUTE) 0.1 0.0 - 0.4 x10E3/uL   Basophils Absolute 0.0 0.0 - 0.2 x10E3/uL   Immature Granulocytes 0 Not Estab. %   Immature Grans (Abs) 0.0 0.0 - 0.1 x10E3/uL   Retic Ct Pct 1.0 0.6 - 2.6 %  CMP14+EGFR   Collection Time: 07/16/23 12:26 PM  Result Value Ref Range   Glucose 105 (H) 70 - 99 mg/dL   BUN 14 8 - 27 mg/dL   Creatinine, Ser 8.96 0.76 - 1.27 mg/dL   eGFR 72 >40 fO/fpw/8.26    BUN/Creatinine Ratio 14 10 - 24   Sodium 141 134 - 144 mmol/L   Potassium 3.9 3.5 - 5.2 mmol/L   Chloride 100 96 - 106 mmol/L   CO2 25 20 - 29 mmol/L   Calcium  8.6 8.6 - 10.2 mg/dL   Total Protein 6.1 6.0 - 8.5 g/dL   Albumin 3.5 (L) 3.7 - 4.7 g/dL   Globulin, Total 2.6 1.5 - 4.5 g/dL   Bilirubin Total 0.5 0.0 - 1.2 mg/dL   Alkaline Phosphatase 67 44 - 121 IU/L   AST 16 0 - 40 IU/L   ALT 9 0 - 44 IU/L  Thyroid  Panel With  TSH   Collection Time: 07/16/23 12:26 PM  Result Value Ref Range   TSH 3.730 0.450 - 4.500 uIU/mL   T4, Total 6.0 4.5 - 12.0 ug/dL   T3 Uptake Ratio 32 24 - 39 %   Free Thyroxine Index 1.9 1.2 - 4.9  VITAMIN D  25 Hydroxy (Vit-D Deficiency, Fractures)   Collection Time: 07/16/23 12:26 PM  Result Value Ref Range   Vit D, 25-Hydroxy 26.9 (L) 30.0 - 100.0 ng/mL       Pertinent labs & imaging results that were available during my care of the patient were reviewed by me and considered in my medical decision making.  Assessment & Plan:  [name] was seen today for follow-up.  Diagnoses and all orders for this visit:  Fall, subsequent encounter -     traMADol  (ULTRAM ) 50 MG tablet; Take 1 tablet (50 mg total) by mouth every 8 (eight) hours as needed for up to 5 days. -     CT THORACIC SPINE WO CONTRAST; Future -     CT LUMBAR SPINE WO CONTRAST; Future  Acute bilateral thoracic back pain -     Ambulatory referral to Orthopedic Surgery -     traMADol  (ULTRAM ) 50 MG tablet; Take 1 tablet (50 mg total) by mouth every 8 (eight) hours as needed for up to 5 days. -     CT THORACIC SPINE WO CONTRAST; Future  Acute bilateral low back pain without sciatica -     Ambulatory referral to Orthopedic Surgery -     traMADol  (ULTRAM ) 50 MG tablet; Take 1 tablet (50 mg total) by mouth every 8 (eight) hours as needed for up to 5 days. -     CT LUMBAR SPINE WO CONTRAST; Future  Vitamin B 12 deficiency -     cyanocobalamin  (VITAMIN B12) injection 1,000 mcg     Assessment and  Plan    Fall with Back Pain Experienced a fall resulting in significant lower back pain. X-rays from [facility] were unremarkable. Spondylosis contributing to pain. Bedridden due to pain, no physical or occupational therapy yet. Pain exacerbated by movement, described as severe. Risks of tramadol  include sedation, constipation, and increased fall risk. Advised to stay mobile to prevent prolonged soreness. - Order CT of the spine - Refer to orthopedic specialist - Prescribe tramadol  for severe pain, to be taken only if necessary - Send prescription to [facility] in [place] - Re-refer to physical and occupational therapy after imaging results - Instruct to go to the hospital if there is any loss of bowel or bladder function or inability to walk  Repatha  Coverage Issue Receiving Repatha  for free through assistance programs, recently informed it would cost $2000. Needs new prior authorization from cardiologist, Dr. Alvan, to continue receiving medication at no cost. - Send a message to Mliss to assist with Repatha  prior authorization and coverage  General Health Maintenance Insurance changed on February 1st, causing delays in receiving necessary medical equipment and services. - Ensure new insurance information is updated and provided to relevant parties - Follow up on referrals and services delayed due to insurance change  Follow-up - Follow up with orthopedic specialist after CT results - Ensure physical and occupational therapy referrals are processed after imaging - Mliss to follow up regarding Repatha  coverage.          Continue all other maintenance medications.  Follow up plan: Return if symptoms worsen or fail to improve.   Continue healthy lifestyle choices, including diet (rich in fruits,  vegetables, and lean proteins, and low in salt and simple carbohydrates) and exercise (at least 30 minutes of moderate physical activity daily).  Educational handout given for acute back  pain  The above assessment and management plan was discussed with the patient. The patient verbalized understanding of and has agreed to the management plan. Patient is aware to call the clinic if they develop any new symptoms or if symptoms persist or worsen. Patient is aware when to return to the clinic for a follow-up visit. Patient educated on when it is appropriate to go to the emergency department.   Rosaline Bruns, FNP-C [facility] [phone]

## 2023-07-29 ENCOUNTER — Telehealth: Payer: Self-pay | Admitting: Family Medicine

## 2023-07-29 DIAGNOSIS — Z0279 Encounter for issue of other medical certificate: Secondary | ICD-10-CM

## 2023-07-30 ENCOUNTER — Ambulatory Visit: Payer: Medicare HMO

## 2023-07-31 ENCOUNTER — Ambulatory Visit (INDEPENDENT_AMBULATORY_CARE_PROVIDER_SITE_OTHER): Payer: Medicare Other

## 2023-07-31 ENCOUNTER — Other Ambulatory Visit: Payer: Self-pay

## 2023-07-31 DIAGNOSIS — E538 Deficiency of other specified B group vitamins: Secondary | ICD-10-CM

## 2023-07-31 DIAGNOSIS — E782 Mixed hyperlipidemia: Secondary | ICD-10-CM

## 2023-07-31 MED ORDER — CYANOCOBALAMIN 1000 MCG/ML IJ SOLN
1000.0000 ug | INTRAMUSCULAR | Status: AC
  Administered 2023-07-31 – 2023-08-21 (×3): 1000 ug via INTRAMUSCULAR

## 2023-07-31 NOTE — Progress Notes (Signed)
Patient is in office today for a nurse visit for B12 Injection. Patient Injection was given in the  Right deltoid. Patient tolerated injection well.  Patient also states that he had to stop taking the tramadol, it was causing hallucinations. Since stopping medication pain in low back is worse and effecting daily activity. Made appointment for tomorrow with PCP for patient to discuss.

## 2023-08-01 ENCOUNTER — Ambulatory Visit (INDEPENDENT_AMBULATORY_CARE_PROVIDER_SITE_OTHER): Payer: Medicare Other | Admitting: Family Medicine

## 2023-08-01 ENCOUNTER — Encounter: Payer: Self-pay | Admitting: Family Medicine

## 2023-08-01 VITALS — BP 150/71 | HR 91 | Temp 97.5°F

## 2023-08-01 DIAGNOSIS — M546 Pain in thoracic spine: Secondary | ICD-10-CM | POA: Diagnosis not present

## 2023-08-01 DIAGNOSIS — M545 Low back pain, unspecified: Secondary | ICD-10-CM | POA: Diagnosis not present

## 2023-08-01 MED ORDER — METHYLPREDNISOLONE ACETATE 80 MG/ML IJ SUSP
80.0000 mg | Freq: Once | INTRAMUSCULAR | Status: AC
  Administered 2023-08-01: 60 mg via INTRAMUSCULAR

## 2023-08-01 NOTE — Telephone Encounter (Signed)
PCP completed and signed FMLA forms. They have been faxed to [facility] at fax number [phone]. Patient has been contacted and informed they are complete. Copy at front desk.

## 2023-08-01 NOTE — Progress Notes (Signed)
Subjective:  Patient ID: [name], male    DOB: Aug 20, 1940, 83 y.o.   MRN: [id number]  Patient Care Team: Sonny Masters, FNP as PCP - General (Family Medicine) Wyline Mood, Dorothe Pea, MD as PCP - Cardiology (Cardiology) Wyline Mood Dorothe Pea, MD as Consulting Physician (Cardiology) Bjorn Pippin, MD as Attending Physician (Urology) Danella Maiers, [facility] (Pharmacist)   Chief Complaint:  Back Pain (Ongoing x 2 weeks after a fall )   HPI: [name] is a 83 y.o. male presenting on 08/01/2023 for Back Pain (Ongoing x 2 weeks after a fall )   Discussed the use of AI scribe software for clinical note transcription with the patient, who gave verbal consent to proceed.  History of Present Illness   [name] is an 83 year old male who presents with hallucinations after taking tramadol for back pain.  He experiences hallucinations after taking tramadol, which was prescribed for pain management. He describes seeing 'bugs all over the place' and having vivid hallucinations of violent scenarios. Tramadol was taken as prescribed, one pill a day, but it did not alleviate his pain.  He has persistent mid and lower back pain, severe enough to warrant an MRI scheduled for tomorrow and a CT scan of the spine on the twentieth. Pain management at home includes regular Tylenol, as NSAIDs are contraindicated due to Coumadin use, which affects bleeding times.  He is currently on Coumadin, which limits pain management options due to bleeding risk.     Relevant past medical, surgical, family, and social history reviewed and updated as indicated.  Allergies and medications reviewed and updated. Data reviewed: Chart in Epic.   Past Medical History:  Diagnosis Date   Anxiety disorder    Benign prostatic hypertrophy    Bilateral cataracts    CAD (coronary artery disease)    Catheterization 2009, mild CAD   Carotid artery disease (HCC)    Doppler, September, 2011, less than 50% bilateral    COPD (chronic obstructive pulmonary disease) (HCC)    CVA (cerebral infarction)    Hospitalization.  MRI...    Left anterior cerebral artery territory, September, 2011... Coumadin started   DYSLIPIDEMIA 11/01/2009   Qualifier: Diagnosis of  By: Myrtis Ser, MD, [facility], Lemmie Evens    Ejection fraction    EF 60%   Ejection fraction    EF 55-60%, echo, September, 2011   Gastroesophageal reflux disease    History of deep venous thrombosis (DVT) of distal vein of left lower extremity 08/05/2020   History of left inguinal hernia    Hyperlipidemia    Persistent atrial fibrillation (HCC)        Ptosis of eyelid, left    Surgery in the past   Sweating abnormality    Episodes of sweating appeared to be related to a delayed reaction to niacin   Vitamin D insufficiency     Past Surgical History:  Procedure Laterality Date   APPENDECTOMY     CATARACT EXTRACTION, BILATERAL     INGUINAL HERNIA REPAIR Left 07/02/2019   INGUINAL HERNIA REPAIR Left 07/01/2020   Procedure: LEFT INGUINAL HERNIA REPAIR WITH MESH;  Surgeon: Lucretia Roers, MD;  Location: [facility];  Service: General;  Laterality: Left;   KNEE SURGERY Right 2019   torn ligament?    TRANSURETHRAL RESECTION OF PROSTATE N/A 08/19/2018   Procedure: TRANSURETHRAL RESECTION OF THE PROSTATE (TURP) OF PROSTATE ABCESS;  Surgeon: Sebastian Ache, MD;  Location: [facility];  Service: Urology;  Laterality: N/A;    Social History   Socioeconomic History   Marital status: Married    Spouse name: [name]   Number of children: 5   Years of education: 11   Highest education level: 11th grade  Occupational History   Occupation: Retired Biochemist, clinical   Tobacco Use   Smoking status: Former    Current packs/day: 0.00    Average packs/day: 3.0 packs/day for 50.0 years (150.0 ttl pk-yrs)    Types: Cigarettes    Start date: 03/18/1940    Quit date: 03/18/1990    Years since quitting: 33.3    Passive exposure: Past   Smokeless tobacco: Former     Types: Chew    Quit date: 06/18/2000  Vaping Use   Vaping status: Never Used  Substance and Sexual Activity   Alcohol use: No    Alcohol/week: 0.0 standard drinks of alcohol   Drug use: No   Sexual activity: Not Currently    Partners: Female  Other Topics Concern   Not on file  Social History Narrative   Quit smoking in 1993. No significant alcohol abuse. Retired from trucking.    Social Drivers of Corporate investment banker Strain: Low Risk  (05/07/2023)   Received from [facility]   Overall Financial Resource Strain (CARDIA)    Difficulty of Paying Living Expenses: Not hard at all  Food Insecurity: No Food Insecurity (05/07/2023)   Received from [facility]   Hunger Vital Sign    Worried About Running Out of Food in the Last Year: Never true    Ran Out of Food in the Last Year: Never true  Transportation Needs: No Transportation Needs (05/07/2023)   Received from [facility] - Transportation    Lack of Transportation (Medical): No    Lack of Transportation (Non-Medical): No  Physical Activity: Insufficiently Active (03/26/2023)   Exercise Vital Sign    Days of Exercise per Week: 3 days    Minutes of Exercise per Session: 30 min  Stress: No Stress Concern Present (03/26/2023)   [employer] of Occupational Health - Occupational Stress Questionnaire    Feeling of Stress : Not at all  Social Connections: Unknown (04/21/2023)   Received from [facility]   Social Network    Social Network: Not on file  Intimate Partner Violence: Unknown (04/21/2023)   Received from [facility]   HITS    Physically Hurt: Not on file    Insult or Talk Down To: Not on file    Threaten Physical Harm: Not on file    Scream or Curse: Not on file    Outpatient Encounter Medications as of 08/01/2023  Medication Sig   albuterol (PROVENTIL) (2.5 MG/3ML) 0.083% nebulizer solution USE 1 VIAL IN NEBULIZER EVERY 6 HOURS AS NEEDED FOR WHEEZING FOR SHORTNESS OF BREATH   albuterol  (VENTOLIN HFA) 108 (90 Base) MCG/ACT inhaler Inhale 2 puffs into the lungs every 6 (six) hours as needed for wheezing or shortness of breath.   aspirin EC 81 MG tablet Take 81 mg by mouth daily. Swallow whole.   atorvastatin (LIPITOR) 40 MG tablet Take 40 mg by mouth daily.   Budeson-Glycopyrrol-Formoterol (BREZTRI AEROSPHERE) 160-9-4.8 MCG/ACT AERO Inhale 2 puffs into the lungs in the morning and at bedtime.   famotidine (PEPCID) 20 MG tablet Take 20 mg by mouth 2 (two) times daily.   furosemide (LASIX) 20 MG tablet TAKE 1 TABLET DAILY AS NEEDED FOR EDEMA (SWELLING).   ketorolac (  ACULAR) 0.5 % ophthalmic solution PLACE 1 DROP INTO THE LEFT EYE 4 TIMES DAILY.   metoprolol tartrate (LOPRESSOR) 25 MG tablet TAKE 1 TABLET TWICE DAILY   omeprazole (PRILOSEC) 20 MG capsule Take 20 mg by mouth daily.   polyethylene glycol powder (GLYCOLAX/MIRALAX) 17 GM/SCOOP powder Take 17 g by mouth daily.   REPATHA SURECLICK 140 MG/ML SOAJ INJECT 140 MG SUBCUTANEOUSLY ONCE EVERY 14 DAYS   warfarin (COUMADIN) 5 MG tablet TAKE 1/2 TO 1 TABLET BY MOUTH ONCE DAILY AS DIRECTED BY COUMADIN CLINIC   Facility-Administered Encounter Medications as of 08/01/2023  Medication   cyanocobalamin (VITAMIN B12) injection 1,000 mcg   cyanocobalamin (VITAMIN B12) injection 1,000 mcg   [COMPLETED] methylPREDNISolone acetate (DEPO-MEDROL) injection 80 mg    Allergies  Allergen Reactions   Asa [Aspirin]     GI upset   Niacin     hot flashes   Pravastatin     myalgias   Rosuvastatin     myalgias   Simvastatin     Myalgias     Pertinent ROS per HPI, otherwise unremarkable      Objective:  BP (!) 150/71   Pulse 91   Temp (!) 97.5 F (36.4 C)   SpO2 96%    Wt Readings from Last 3 Encounters:  07/23/23 161 lb 6.4 oz (73.2 kg)  07/16/23 167 lb (75.8 kg)  05/03/23 168 lb (76.2 kg)    Physical Exam Vitals and nursing note reviewed.  Constitutional:      General: He is not in acute distress.    Appearance: He is  well-developed and well-groomed. He is not ill-appearing, toxic-appearing or diaphoretic.     Comments: Uncomfortable  HENT:     Head: Normocephalic and atraumatic.     Mouth/Throat:     Mouth: Mucous membranes are moist.  Eyes:     Conjunctiva/sclera: Conjunctivae normal.     Pupils: Pupils are equal, round, and reactive to light.     Comments: Lid ptosis - chronic  Cardiovascular:     Rate and Rhythm: Normal rate. Rhythm irregularly irregular.  Pulmonary:     Breath sounds: Normal breath sounds.  Musculoskeletal:     Right lower leg: No edema.     Left lower leg: No edema.  Skin:    General: Skin is warm and dry.     Capillary Refill: Capillary refill takes less than 2 seconds.  Neurological:     General: No focal deficit present.     Mental Status: He is alert and oriented to person, place, and time.     Gait: Gait abnormal (in  wheelchair).  Psychiatric:        Mood and Affect: Mood normal.        Behavior: Behavior normal. Behavior is cooperative.        Thought Content: Thought content normal.        Judgment: Judgment normal.     Results for orders placed or performed in visit on 07/16/23  Anemia Profile B   Collection Time: 07/16/23 12:26 PM  Result Value Ref Range   Total Iron Binding Capacity 268 250 - 450 ug/dL   UIBC 098 119 - 147 ug/dL   Iron 77 38 - 829 ug/dL   Iron Saturation 29 15 - 55 %   Ferritin 355 30 - 400 ng/mL   Vitamin B-12 299 232 - 1,245 pg/mL   Folate 6.5 >3.0 ng/mL   WBC 11.0 (H) 3.4 - 10.8 x10E3/uL   RBC  4.86 4.14 - 5.80 x10E6/uL   Hemoglobin 14.2 13.0 - 17.7 g/dL   Hematocrit 40.9 81.1 - 51.0 %   MCV 91 79 - 97 fL   MCH 29.2 26.6 - 33.0 pg   MCHC 32.3 31.5 - 35.7 g/dL   RDW 91.4 78.2 - 95.6 %   Platelets 235 150 - 450 x10E3/uL   Neutrophils 71 Not Estab. %   Lymphs 20 Not Estab. %   Monocytes 8 Not Estab. %   Eos 1 Not Estab. %   Basos 0 Not Estab. %   Neutrophils Absolute 7.8 (H) 1.4 - 7.0 x10E3/uL   Lymphocytes Absolute 2.1 0.7  - 3.1 x10E3/uL   Monocytes Absolute 0.8 0.1 - 0.9 x10E3/uL   EOS (ABSOLUTE) 0.1 0.0 - 0.4 x10E3/uL   Basophils Absolute 0.0 0.0 - 0.2 x10E3/uL   Immature Granulocytes 0 Not Estab. %   Immature Grans (Abs) 0.0 0.0 - 0.1 x10E3/uL   Retic Ct Pct 1.0 0.6 - 2.6 %  CMP14+EGFR   Collection Time: 07/16/23 12:26 PM  Result Value Ref Range   Glucose 105 (H) 70 - 99 mg/dL   BUN 14 8 - 27 mg/dL   Creatinine, Ser 2.13 0.76 - 1.27 mg/dL   eGFR 72 >08 MV/HQI/6.96   BUN/Creatinine Ratio 14 10 - 24   Sodium 141 134 - 144 mmol/L   Potassium 3.9 3.5 - 5.2 mmol/L   Chloride 100 96 - 106 mmol/L   CO2 25 20 - 29 mmol/L   Calcium 8.6 8.6 - 10.2 mg/dL   Total Protein 6.1 6.0 - 8.5 g/dL   Albumin 3.5 (L) 3.7 - 4.7 g/dL   Globulin, Total 2.6 1.5 - 4.5 g/dL   Bilirubin Total 0.5 0.0 - 1.2 mg/dL   Alkaline Phosphatase 67 44 - 121 IU/L   AST 16 0 - 40 IU/L   ALT 9 0 - 44 IU/L  Thyroid Panel With TSH   Collection Time: 07/16/23 12:26 PM  Result Value Ref Range   TSH 3.730 0.450 - 4.500 uIU/mL   T4, Total 6.0 4.5 - 12.0 ug/dL   T3 Uptake Ratio 32 24 - 39 %   Free Thyroxine Index 1.9 1.2 - 4.9  VITAMIN D 25 Hydroxy (Vit-D Deficiency, Fractures)   Collection Time: 07/16/23 12:26 PM  Result Value Ref Range   Vit D, 25-Hydroxy 26.9 (L) 30.0 - 100.0 ng/mL       Pertinent labs & imaging results that were available during my care of the patient were reviewed by me and considered in my medical decision making.  Assessment & Plan:  Rorey was seen today for back pain.  Diagnoses and all orders for this visit:  Acute bilateral thoracic back pain -     methylPREDNISolone acetate (DEPO-MEDROL) injection 80 mg  Acute bilateral low back pain without sciatica -     methylPREDNISolone acetate (DEPO-MEDROL) injection 80 mg     Assessment and Plan    Chronic Back Pain Chronic mid and lower back pain. Tramadol caused hallucinations and was ineffective. MRI scheduled for tomorrow and CT scan on the 20th.  Follow-up with Dr. Ophelia Charter on the 20th. NSAIDs contraindicated due to Coumadin. Steroid injections discussed as an alternative. Advised to seek hospital care for stat imaging if pain worsens. - Administer steroid injection today - Recommend Tylenol Arthritis for pain management - Proceed with MRI and CT scan as scheduled - Follow up with Dr. Ophelia Charter on the 20th - Advise hospital visit for stat imaging if  pain worsens  Medication Management (Repatha) Repatha previously managed by Dr. Verna Czech office, but not received recently. Awaiting response from Dr. Verna Czech office. - Contact Dr. Verna Czech office to check Repatha status.          Continue all other maintenance medications.  Follow up plan: Return if symptoms worsen or fail to improve.   Continue healthy lifestyle choices, including diet (rich in fruits, vegetables, and lean proteins, and low in salt and simple carbohydrates) and exercise (at least 30 minutes of moderate physical activity daily).    The above assessment and management plan was discussed with the patient. The patient verbalized understanding of and has agreed to the management plan. Patient is aware to call the clinic if they develop any new symptoms or if symptoms persist or worsen. Patient is aware when to return to the clinic for a follow-up visit. Patient educated on when it is appropriate to go to the emergency department.   Kari Baars, FNP-C [facility] [phone]

## 2023-08-02 ENCOUNTER — Telehealth: Payer: Self-pay

## 2023-08-02 ENCOUNTER — Ambulatory Visit (HOSPITAL_COMMUNITY)
Admission: RE | Admit: 2023-08-02 | Discharge: 2023-08-02 | Disposition: A | Discharge status: Home / Self Care | Payer: Medicare Other | Source: Ambulatory Visit | Attending: Family Medicine | Admitting: Family Medicine

## 2023-08-02 DIAGNOSIS — I700 Atherosclerosis of aorta: Secondary | ICD-10-CM | POA: Insufficient documentation

## 2023-08-02 DIAGNOSIS — R911 Solitary pulmonary nodule: Secondary | ICD-10-CM | POA: Diagnosis not present

## 2023-08-02 DIAGNOSIS — J439 Emphysema, unspecified: Secondary | ICD-10-CM | POA: Insufficient documentation

## 2023-08-02 DIAGNOSIS — W19XXXD Unspecified fall, subsequent encounter: Secondary | ICD-10-CM

## 2023-08-02 DIAGNOSIS — M47819 Spondylosis without myelopathy or radiculopathy, site unspecified: Secondary | ICD-10-CM | POA: Diagnosis not present

## 2023-08-02 DIAGNOSIS — S32041A Stable burst fracture of fourth lumbar vertebra, initial encounter for closed fracture: Secondary | ICD-10-CM | POA: Insufficient documentation

## 2023-08-02 DIAGNOSIS — W19XXXA Unspecified fall, initial encounter: Secondary | ICD-10-CM | POA: Diagnosis not present

## 2023-08-02 DIAGNOSIS — M546 Pain in thoracic spine: Secondary | ICD-10-CM

## 2023-08-02 DIAGNOSIS — M545 Low back pain, unspecified: Secondary | ICD-10-CM

## 2023-08-02 NOTE — Progress Notes (Signed)
Care Guide Pharmacy Note  08/02/2023 Name: ZEPHAN BEAUCHAINE MRN: [id number] DOB: [birth year]  Referred By: Sonny Masters, FNP Reason for referral: Care Coordination (Outreach to schedule with Pharm d )   JOVANI FLURY is a 83 y.o. year old male who is a primary care patient of Rakes, Doralee Albino, FNP.  OSIAS RESNICK was referred to the pharmacist for assistance related to: HLD  Successful contact was made with the patient to discuss pharmacy services including being ready for the pharmacist to call at least 5 minutes before the scheduled appointment time and to have medication bottles and any blood pressure readings ready for review. The patient agreed to meet with the pharmacist via telephone visit on (date/time).09/03/2023  Penne Lash , RMA     [facility] Guide  Direct Dial: [phone]  Website: [email or web address]

## 2023-08-07 ENCOUNTER — Other Ambulatory Visit: Payer: Self-pay | Admitting: Family Medicine

## 2023-08-07 MED ORDER — METOPROLOL TARTRATE 25 MG PO TABS: 25.0000 mg | ORAL_TABLET | Freq: Two times a day (BID) | ORAL | 0 refills | Status: DC

## 2023-08-07 NOTE — Telephone Encounter (Signed)
Copied from CRM [id number]. Topic: Clinical - Medication Refill >> Aug 07, 2023 10:12 AM Antony Haste wrote: Most Recent Primary Care Visit:  Provider: Sonny Masters  Department: [facility] MED  Visit Type: OFFICE VISIT  Date: 08/01/2023  Medication: metoprolol tartrate (LOPRESSOR) 25 MG tablet  Has the patient contacted their pharmacy? No (Agent: If no, request that the patient contact the pharmacy for the refill. If patient does not wish to contact the pharmacy document the reason why and proceed with request.) (Agent: If yes, when and what did the pharmacy advise?)  Is this the correct pharmacy for this prescription? Yes If no, delete pharmacy and type the correct one.  This is the patient's preferred pharmacy:   [id number] - [place], [place] - [address] [place] [address] Phone: [phone] Fax: [phone]   Has the prescription been filled recently? No  Is the patient out of the medication? Yes  Has the patient been seen for an appointment in the last year OR does the patient have an upcoming appointment? Yes  Can we respond through MyChart? No, callback preferred.  Agent: Please be advised that Rx refills may take up to 3 business days. We ask that you follow-up with your pharmacy.

## 2023-08-08 ENCOUNTER — Ambulatory Visit (INDEPENDENT_AMBULATORY_CARE_PROVIDER_SITE_OTHER): Payer: Medicare Other | Admitting: Orthopaedic Surgery

## 2023-08-08 ENCOUNTER — Encounter: Payer: Self-pay | Admitting: Orthopaedic Surgery

## 2023-08-08 ENCOUNTER — Ambulatory Visit (INDEPENDENT_AMBULATORY_CARE_PROVIDER_SITE_OTHER): Payer: Medicare Other | Admitting: *Deleted

## 2023-08-08 VITALS — Ht 66.0 in | Wt 161.0 lb

## 2023-08-08 DIAGNOSIS — S32000A Wedge compression fracture of unspecified lumbar vertebra, initial encounter for closed fracture: Secondary | ICD-10-CM | POA: Insufficient documentation

## 2023-08-08 DIAGNOSIS — S32040A Wedge compression fracture of fourth lumbar vertebra, initial encounter for closed fracture: Secondary | ICD-10-CM | POA: Diagnosis not present

## 2023-08-08 DIAGNOSIS — E538 Deficiency of other specified B group vitamins: Secondary | ICD-10-CM | POA: Diagnosis not present

## 2023-08-08 NOTE — Progress Notes (Signed)
Office Visit Note   Patient: [name]           Date of Birth: [birth year]           MRN: [id number] Visit Date: 08/08/2023              Requested by: Sonny Masters, FNP [address] [place],  [place] [address] PCP: Sonny Masters, FNP   Assessment & Plan: Visit Diagnoses:  1. Compression fracture of L4 vertebra, initial encounter [facility])     Plan: Thoracic CT reviewed patient has some spondylosis calcification anterior longitudinal ligament without acute fractures in the thoracic spine.  Lumbar spine CT shows compression fracture L4 with 1 or 2 mm retropulsion.  Patient still has a wide canal AP diameter 14 mm.  Patient can return in 5 weeks for x-rays.  We discussed avoiding bending lifting.  Uses walker fall prevention discussed.  He can use Tylenol for pain.  CT scan was reviewed with patient, wife and also son.  We discussed he should get symptomatic relief for couple months from the fracture date.  Return in 5 weeks with lateral lumbar x-ray and AP on return.  We applied a lumbar corset he can use it intermittently for symptomatic relief.  Follow-Up Instructions: Return in about 5 weeks (around 09/12/2023).   Orders:  No orders of the defined types were placed in this encounter.  No orders of the defined types were placed in this encounter.     Procedures: No procedures performed   Clinical Data: No additional findings.   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  He had acute back pain.  CT scan was done 6 days ago but has not been read.  He had thoracic CT done as well.  Pain was severe tried some tramadol which caused elucidation's.  He has been using Tylenol which does okay.  Family threw away the tramadol.  Past smoker with some PAD quit smoking.  History of vitamin D  deficiency.  Patient with chronic kidney disease coronary artery disease and is on chronic Coumadin.  Patient has a walker at home.  Review of Systems all systems noncontributory HPI.   Objective: Vital Signs: Ht 5\' 6"  (1.676 m)   Wt 161 lb (73 kg)   BMI 25.99 kg/m   Physical Exam Constitutional:      Appearance: He is well-developed.  HENT:     Head: Normocephalic and atraumatic.     Right Ear: External ear normal.     Left Ear: External ear normal.     Mouth/Throat:     Comments: Dentures which are at home.  Eyes:     Pupils: Pupils are equal, round, and reactive to light.  Neck:     Thyroid: No thyromegaly.     Trachea: No tracheal deviation.  Cardiovascular:     Rate and Rhythm: Normal rate.  Pulmonary:     Effort: Pulmonary effort is normal.     Breath sounds: No wheezing.  Abdominal:     General: Bowel sounds are normal.     Palpations: Abdomen is soft.  Musculoskeletal:     Cervical back: Neck supple.  Skin:    General: Skin is warm and dry.     Capillary Refill:  Capillary refill takes less than 2 seconds.  Neurological:     Mental Status: He is alert and oriented to person, place, and time.  Psychiatric:        Behavior: Behavior normal.        Thought Content: Thought content normal.        Judgment: Judgment normal.     Ortho Exam patient has intact sensation lower extremities active dorsiflexion plantarflexion.  Walker at home these been using.  Specialty Comments:  No specialty comments available.  Imaging: No results found.   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 03/22/2020   Vitamin D insufficiency    Prediabetes 03/31/2018   BPH (benign prostatic hyperplasia) 02/07/2017   Chronic kidney disease (CKD), 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   Vertigo 02/07/2017   Chronic anticoagulation 02/27/2013   COPD GOLD 2 with reversibility  01/21/2013   Pulmonary nodules 12/19/2012   CAD (coronary artery disease)    HLD (hyperlipidemia) 11/01/2009   ATRIAL FIBRILLATION, CHRONIC 11/01/2009   GERD 11/01/2009   Past Medical History:  Diagnosis Date   Anxiety disorder    Benign prostatic hypertrophy    Bilateral cataracts    CAD (coronary artery disease)    Catheterization 2009, mild CAD   Carotid artery disease (HCC)    Doppler, September, 2011, less than 50% bilateral   COPD (chronic obstructive pulmonary disease) (HCC)    CVA (cerebral infarction)    Hospitalization.  MRI...    Left anterior cerebral artery territory, September, 2011... Coumadin started   DYSLIPIDEMIA 11/01/2009   Qualifier: Diagnosis of  By: Myrtis Ser, MD, [facility], Lemmie Evens    Ejection fraction    EF 60%   Ejection fraction    EF 55-60%, echo, September, 2011   Gastroesophageal reflux disease    History of deep venous thrombosis (DVT) of distal vein of left lower extremity 08/05/2020   History of left inguinal hernia    Hyperlipidemia    Persistent atrial fibrillation (HCC)        Ptosis of eyelid, left    Surgery in the past   Sweating abnormality    Episodes of sweating appeared to be related to a delayed reaction to niacin   Vitamin D insufficiency     Family History  Problem Relation Age of Onset   Heart attack Mother    Other Other        Insignificant for premature CAD   Heart disease Sister    Hyperlipidemia Sister    Kidney disease  Son    Heart attack Paternal Grandfather     Past Surgical History:  Procedure Laterality Date   APPENDECTOMY     CATARACT EXTRACTION, BILATERAL     INGUINAL HERNIA REPAIR Left 07/02/2019   INGUINAL HERNIA REPAIR Left 07/01/2020   Procedure: LEFT INGUINAL HERNIA REPAIR WITH MESH;  Surgeon: Lucretia Roers, MD;  Location: [facility];  Service: General;  Laterality: Left;   KNEE SURGERY Right 2019   torn ligament?    TRANSURETHRAL RESECTION OF PROSTATE N/A 08/19/2018   Procedure: TRANSURETHRAL RESECTION OF THE PROSTATE (TURP) OF PROSTATE ABCESS;  Surgeon: Sebastian Ache, MD;  Location: [facility];  Service: Urology;  Laterality: N/A;   Social History   Occupational History   Occupation: Retired Biochemist, clinical   Tobacco Use   Smoking status: Former    Current packs/day: 0.00    Average packs/day: 3.0 packs/day for 50.0 years (150.0 ttl pk-yrs)    Types: Cigarettes    Start date: 03/18/1940    Quit date: 03/18/1990    Years since quitting: 33.4    Passive exposure: Past   Smokeless tobacco: Former    Types: Chew    Quit date: 06/18/2000  Vaping Use   Vaping status: Never Used  Substance and Sexual Activity   Alcohol use: No    Alcohol/week: 0.0 standard drinks of alcohol   Drug use: No   Sexual activity: Not Currently    Partners: Female

## 2023-08-08 NOTE — Progress Notes (Signed)
 Patient is in office today for a nurse visit for B12 Injection. Patient Injection was given in the  Left deltoid. Patient tolerated injection well.

## 2023-08-12 ENCOUNTER — Ambulatory Visit: Payer: Medicare Other | Attending: Cardiology | Admitting: *Deleted

## 2023-08-12 ENCOUNTER — Telehealth: Payer: Self-pay | Admitting: Family Medicine

## 2023-08-12 DIAGNOSIS — Z5181 Encounter for therapeutic drug level monitoring: Secondary | ICD-10-CM | POA: Diagnosis not present

## 2023-08-12 DIAGNOSIS — I4891 Unspecified atrial fibrillation: Secondary | ICD-10-CM

## 2023-08-12 LAB — POCT INR: INR: 1.6 — AB (ref 2.0–3.0)

## 2023-08-12 NOTE — Telephone Encounter (Unsigned)
 Copied from CRM [id number]. Topic: Clinical - Prescription Issue >> Aug 12, 2023  1:57 PM [name] wrote: Reason for CRM: Patient's daughter, French Ana is checking on the status of her father's Repatha - she is asking if we can check in with Dr. Verna Czech office and see what is going on. They were seen on 02/13 and they were still waiting for a response. Patient previously had a different insurance and was receiving some assistance to get this medication covered for him. Daughter doesn't think he has many doses left and asking if we can look into this for them again.

## 2023-08-12 NOTE — Patient Instructions (Signed)
 Take warfarin 1 1/2 tablets tonight then resume 1/2 tablet daily except 1 tablet on Tuesdays, Thursdays and Saturdays Recheck in 3 wks

## 2023-08-13 ENCOUNTER — Other Ambulatory Visit (HOSPITAL_COMMUNITY): Payer: Self-pay

## 2023-08-13 ENCOUNTER — Telehealth: Payer: Self-pay

## 2023-08-13 NOTE — Telephone Encounter (Signed)
 PA request has been Submitted. New Encounter created for follow up. For additional info see Pharmacy Prior Auth telephone encounter from 08/13/23.

## 2023-08-13 NOTE — Telephone Encounter (Signed)
 Pharmacy Patient Advocate Encounter   Received notification from Physician's Office that prior authorization for REPATHA is required/requested.   Insurance verification completed.   The patient is insured through [facility] .   Per test claim: PA required; PA submitted to above mentioned insurance via CoverMyMeds Key/confirmation #/EOC [id number] Status is pending

## 2023-08-15 ENCOUNTER — Other Ambulatory Visit: Payer: Medicare Other

## 2023-08-15 DIAGNOSIS — E782 Mixed hyperlipidemia: Secondary | ICD-10-CM

## 2023-08-15 DIAGNOSIS — G720 Drug-induced myopathy: Secondary | ICD-10-CM

## 2023-08-16 ENCOUNTER — Other Ambulatory Visit (HOSPITAL_COMMUNITY): Payer: Self-pay

## 2023-08-16 NOTE — Telephone Encounter (Signed)
 Pharmacy Patient Advocate Encounter  Received notification from [facility] that Prior Authorization for REPATHA has been APPROVED from 08/13/23 to 02/10/24. Ran test claim, Copay is $344.96 (DEDUCTIBLE). This test claim was processed through [facility]- copay amounts may vary at other pharmacies due to pharmacy/plan contracts, or as the patient moves through the different stages of their insurance plan.

## 2023-08-20 ENCOUNTER — Ambulatory Visit: Payer: Medicare HMO | Admitting: Cardiology

## 2023-08-20 NOTE — Progress Notes (Signed)
 08/15/2023 Name: [name] MRN: [id number] DOB: 16-Jan-1941  Chief Complaint  Patient presents with   Hyperlipidemia    [name] is a 83 y.o. year old male who was referred for medication management by their primary care provider, Rakes, Doralee Albino, FNP. They presented for a face to face visit today.   They were referred to the pharmacist by their PCP for assistance in managing hyperlipidemia    Subjective:  Care Team: Primary Care Provider: Sonny Masters, FNP ;   Medication Access/Adherence  Current Pharmacy:  [facility] - [place], [place] - [address] [place] [address] Phone: [phone] Fax: [phone]  [facility] - [place], [place] - [address] [place] [address] Phone: [phone] Fax: [phone]  [facility] - [place], [place] - [address] [place] [address] Phone: [phone] Fax: [phone]   Patient reports affordability concerns with their medications: Yes  Patient reports access/transportation concerns to their pharmacy: No  Patient reports adherence concerns with their medications:  No     Hyperlipidemia/ASCVD Risk Reduction  Current lipid lowering medications:  repatha Medications previously tried: pravastatin, rosuvastatin, simvastatin  Current dietary patterns: recommended FOLLOWING A HEART HEALTHY DIET/HEALTHY PLATE METHOD  Current physical activity: limited; patient was in wheelchair  Current medication access support: healthwell grant via heart care  Clinical ASCVD: Yes  The ASCVD Risk score (Arnett DK, et al., 2019) failed to calculate for the following reasons:   The 2019 ASCVD risk score is only valid for ages 61 to 58   Risk score cannot be calculated because patient has a medical history suggesting prior/existing ASCVD    Objective:  Lab Results  Component Value Date   HGBA1C 5.7 (H)  04/02/2023    Lab Results  Component Value Date   CREATININE 1.03 07/16/2023   BUN 14 07/16/2023   NA 141 07/16/2023   K 3.9 07/16/2023   CL 100 07/16/2023   CO2 25 07/16/2023    Lab Results  Component Value Date   CHOL 64 (L) 05/03/2023   HDL 37 (L) 05/03/2023   LDLCALC 8 05/03/2023   TRIG 101 05/03/2023   CHOLHDL 1.7 05/03/2023    Medications Reviewed Today     Reviewed by Danella Maiers, [facility] (Pharmacist) on 08/23/23 at 1441  Med List Status: <None>   Medication Order Taking? Sig Documenting Provider Last Dose Status Informant  albuterol (PROVENTIL) (2.5 MG/3ML) 0.083% nebulizer solution [id number] No USE 1 VIAL IN NEBULIZER EVERY 6 HOURS AS NEEDED FOR WHEEZING FOR SHORTNESS OF BREATH Rakes, Doralee Albino, FNP Taking Active   albuterol (VENTOLIN HFA) 108 (90 Base) MCG/ACT inhaler [id number] No Inhale 2 puffs into the lungs every 6 (six) hours as needed for wheezing or shortness of breath. Sonny Masters, FNP Taking Active   aspirin EC 81 MG tablet [id number] No Take 81 mg by mouth daily. Swallow whole. [provider] Taking Active   atorvastatin (LIPITOR) 40 MG tablet [id number] No Take 40 mg by mouth daily. [provider] Taking Active   Budeson-Glycopyrrol-Formoterol (BREZTRI AEROSPHERE) 160-9-4.8 MCG/ACT AERO [id number] No Inhale 2 puffs into the lungs in the morning and at bedtime. Sonny Masters, FNP Taking Active   cyanocobalamin (VITAMIN B12) injection 1,000 mcg [id number]   Sonny Masters, FNP  Active   famotidine (PEPCID) 20 MG tablet [id number] No Take 20 mg by mouth 2 (two) times  daily. [provider] Taking Active   furosemide (LASIX) 20 MG tablet [id number] No TAKE 1 TABLET DAILY AS NEEDED FOR EDEMA (SWELLING). Sharlene Dory, NP Taking Active   ketorolac (ACULAR) 0.5 % ophthalmic solution [id number] No PLACE 1 DROP INTO THE LEFT EYE 4 TIMES DAILY. Daryll Drown, NP Taking Active   metoprolol tartrate (LOPRESSOR) 25 MG tablet [id number] No Take 1  tablet (25 mg total) by mouth 2 (two) times daily. Sonny Masters, FNP Taking Active   omeprazole (PRILOSEC) 20 MG capsule [id number] No Take 20 mg by mouth daily. [provider] Taking Active   polyethylene glycol powder (GLYCOLAX/MIRALAX) 17 GM/SCOOP powder [id number] No Take 17 g by mouth daily. Sonny Masters, FNP Taking Active   REPATHA SURECLICK 140 MG/ML SOAJ [id number] No INJECT 140 MG SUBCUTANEOUSLY ONCE EVERY 14 DAYS Branch, Dorothe Pea, MD Taking Active   warfarin (COUMADIN) 5 MG tablet [id number] No TAKE 1/2 TO 1 TABLET BY MOUTH ONCE DAILY AS DIRECTED BY COUMADIN CLINIC Branch, Dorothe Pea, MD Taking Active   Med List Note Gwenlyn Fudge, [place] 09/19/21 1028): Prescription assistance for Repatha.            Assessment/Plan:   Hyperlipidemia/ASCVD Risk Reduction: - Currently controlled. ---sees heart care - Reviewed long term complications of uncontrolled cholesterol - Recommend to continue repatha; message send to heart care Pharmd to assist with healthwell re-enrollment; we routed PA request to heart care PA team and it was approved - Sample given to patient to bridge supply  - G72 coded for statin myopathy - Meets financial criteria for repatha patient assistance program through [employer]. Will collaborate with provider, CPhT, and patient to pursue assistance.    Follow Up Plan: heart care; PharmD w/ PCP as needed  Kieth Brightly, PharmD, BCACP, CPP Clinical Pharmacist, [facility]

## 2023-08-21 ENCOUNTER — Ambulatory Visit (INDEPENDENT_AMBULATORY_CARE_PROVIDER_SITE_OTHER): Payer: Medicare Other

## 2023-08-21 DIAGNOSIS — E538 Deficiency of other specified B group vitamins: Secondary | ICD-10-CM | POA: Diagnosis not present

## 2023-08-21 NOTE — Progress Notes (Signed)
 Patient is in office today for a nurse visit for B12 Injection. Patient Injection was given in the  Left deltoid. Patient tolerated injection well.

## 2023-08-22 NOTE — Telephone Encounter (Signed)
 Healthwell Kennedy Bucker updated for another year.  Information shared with patient and mailed as well.     ID         [id number] BIN            [id number] PCN      [id number] GRP      [id number]  Expires 07/21/24

## 2023-08-22 NOTE — Telephone Encounter (Signed)
-----   Message from Danella Maiers sent at 08/15/2023  1:43 PM EST ----- Can you please assist patient in enrolling in healthwell grant for Repatha?  It looks like the PA was approved, but it's not letting me enroll him since you guys have enrolled in the grant last year.  I gave patient the number to call healthwell as well.  Let me know if I can help.  I'm happy to relay any info that you may need to get to him.  His last injection was 07/02/23.  Raynelle Fanning

## 2023-09-02 ENCOUNTER — Ambulatory Visit: Payer: Medicare Other

## 2023-09-03 ENCOUNTER — Ambulatory Visit (INDEPENDENT_AMBULATORY_CARE_PROVIDER_SITE_OTHER): Admitting: Family Medicine

## 2023-09-03 ENCOUNTER — Telehealth: Payer: Self-pay | Admitting: Pharmacist

## 2023-09-03 ENCOUNTER — Encounter: Payer: Self-pay | Admitting: Family Medicine

## 2023-09-03 ENCOUNTER — Encounter: Payer: Medicare HMO | Admitting: Vascular Surgery

## 2023-09-03 ENCOUNTER — Ambulatory Visit (INDEPENDENT_AMBULATORY_CARE_PROVIDER_SITE_OTHER)

## 2023-09-03 ENCOUNTER — Other Ambulatory Visit (INDEPENDENT_AMBULATORY_CARE_PROVIDER_SITE_OTHER): Payer: Medicare Other | Admitting: Pharmacist

## 2023-09-03 VITALS — BP 118/63 | HR 78 | Temp 97.2°F | Ht 66.0 in | Wt 163.4 lb

## 2023-09-03 DIAGNOSIS — M79641 Pain in right hand: Secondary | ICD-10-CM

## 2023-09-03 DIAGNOSIS — R140 Abdominal distension (gaseous): Secondary | ICD-10-CM

## 2023-09-03 DIAGNOSIS — R34 Anuria and oliguria: Secondary | ICD-10-CM

## 2023-09-03 DIAGNOSIS — W19XXXD Unspecified fall, subsequent encounter: Secondary | ICD-10-CM

## 2023-09-03 DIAGNOSIS — R413 Other amnesia: Secondary | ICD-10-CM | POA: Diagnosis not present

## 2023-09-03 DIAGNOSIS — J449 Chronic obstructive pulmonary disease, unspecified: Secondary | ICD-10-CM

## 2023-09-03 DIAGNOSIS — R1084 Generalized abdominal pain: Secondary | ICD-10-CM

## 2023-09-03 LAB — URINALYSIS, ROUTINE W REFLEX MICROSCOPIC
Bilirubin, UA: NEGATIVE
Glucose, UA: NEGATIVE
Leukocytes,UA: NEGATIVE
Nitrite, UA: NEGATIVE
RBC, UA: NEGATIVE
Specific Gravity, UA: >1.03 — ABNORMAL HIGH (ref 1.005–1.030)
Urobilinogen, Ur: 0.2 mg/dL (ref 0.2–1.0)
pH, UA: 5 (ref 5.0–7.5)

## 2023-09-03 LAB — MICROSCOPIC EXAMINATION
Bacteria, UA: NONE SEEN
RBC, Urine: NONE SEEN /HPF (ref 0–2)
Renal Epithel, UA: NONE SEEN /HPF

## 2023-09-03 NOTE — Progress Notes (Signed)
 Subjective:  Patient ID: [name], male    DOB: 27-May-1941, 83 y.o.   MRN: [id number]  Patient Care Team: Sonny Masters, FNP as PCP - General (Family Medicine) Wyline Mood, Dorothe Pea, MD as PCP - Cardiology (Cardiology) Wyline Mood Dorothe Pea, MD as Consulting Physician (Cardiology) Bjorn Pippin, MD as Attending Physician (Urology) Danella Maiers, [facility] (Pharmacist)   Chief Complaint:  Hand Pain (Right that has been going on since a fall since 1/29- states it has not improved ) and Back Pain (Since fall on 1/29 that has not improved. )   HPI: [name] is a 83 y.o. male presenting on 09/03/2023 for Hand Pain (Right that has been going on since a fall since 1/29- states it has not improved ) and Back Pain (Since fall on 1/29 that has not improved. )   Discussed the use of AI scribe software for clinical note transcription with the patient, who gave verbal consent to proceed.  History of Present Illness   [name] is an 83 year old male who presents with hand and back pain following a fall.  He has persistent pain in his hand, particularly near the thumb area, which has been ongoing since a fall several months ago. The pain is exacerbated by using a walker.  He experiences chronic back pain with fractures noted since the fall, which affects his mobility. He uses a walker for assistance and has difficulty walking due to the pain.  Since the fall, he has noticed memory issues, including difficulty remembering his birthday and computer passwords. Some memories return after a delay, while others do not. A CT of the head was ordered but has not yet been completed.  He reports a foot drop in his right leg, which causes shaking and difficulty moving, further impacting his mobility.  He experiences reduced urinary frequency despite adequate fluid intake and feels bloated. He notes a discrepancy between his weight loss and bloating. No burning or blood in his urine.  He has bowel  irregularity, with movements occurring every three to seven days, and occasionally uses Miralax to aid with bowel movements.  He has lost weight, currently weighing 163-164 pounds, down from his usual 170-172 pounds.          Relevant past medical, surgical, family, and social history reviewed and updated as indicated.  Allergies and medications reviewed and updated. Data reviewed: Chart in Epic.   Past Medical History:  Diagnosis Date   Anxiety disorder    Benign prostatic hypertrophy    Bilateral cataracts    CAD (coronary artery disease)    Catheterization 2009, mild CAD   Carotid artery disease (HCC)    Doppler, September, 2011, less than 50% bilateral   COPD (chronic obstructive pulmonary disease) (HCC)    CVA (cerebral infarction)    Hospitalization.  MRI...    Left anterior cerebral artery territory, September, 2011... Coumadin started   DYSLIPIDEMIA 11/01/2009   Qualifier: Diagnosis of  By: Myrtis Ser, MD, [facility], Lemmie Evens    Ejection fraction    EF 60%   Ejection fraction    EF 55-60%, echo, September, 2011   Gastroesophageal reflux disease    History of deep venous thrombosis (DVT) of distal vein of left lower extremity 08/05/2020   History of left inguinal hernia    Hyperlipidemia    Persistent atrial fibrillation (HCC)        Ptosis of eyelid, left    Surgery in the past  Sweating abnormality    Episodes of sweating appeared to be related to a delayed reaction to niacin   Vitamin D insufficiency     Past Surgical History:  Procedure Laterality Date   APPENDECTOMY     CATARACT EXTRACTION, BILATERAL     INGUINAL HERNIA REPAIR Left 07/02/2019   INGUINAL HERNIA REPAIR Left 07/01/2020   Procedure: LEFT INGUINAL HERNIA REPAIR WITH MESH;  Surgeon: Lucretia Roers, MD;  Location: [facility];  Service: General;  Laterality: Left;   KNEE SURGERY Right 2019   torn ligament?    TRANSURETHRAL RESECTION OF PROSTATE N/A 08/19/2018   Procedure: TRANSURETHRAL RESECTION OF THE  PROSTATE (TURP) OF PROSTATE ABCESS;  Surgeon: Sebastian Ache, MD;  Location: [facility];  Service: Urology;  Laterality: N/A;    Social History   Socioeconomic History   Marital status: Married    Spouse name: Yehuda Printup   Number of children: 5   Years of education: 11   Highest education level: 11th grade  Occupational History   Occupation: Retired Biochemist, clinical   Tobacco Use   Smoking status: Former    Current packs/day: 0.00    Average packs/day: 3.0 packs/day for 50.0 years (150.0 ttl pk-yrs)    Types: Cigarettes    Start date: 03/18/1940    Quit date: 03/18/1990    Years since quitting: 33.4    Passive exposure: Past   Smokeless tobacco: Former    Types: Chew    Quit date: 06/18/2000  Vaping Use   Vaping status: Never Used  Substance and Sexual Activity   Alcohol use: No    Alcohol/week: 0.0 standard drinks of alcohol   Drug use: No   Sexual activity: Not Currently    Partners: Female  Other Topics Concern   Not on file  Social History Narrative   Quit smoking in 1993. No significant alcohol abuse. Retired from trucking.    Social Drivers of Corporate investment banker Strain: Low Risk  (05/07/2023)   Received from [facility]   Overall Financial Resource Strain (CARDIA)    Difficulty of Paying Living Expenses: Not hard at all  Food Insecurity: No Food Insecurity (05/07/2023)   [facility]   Hunger Vital Sign    Worried About Running Out of Food in the Last Year: Never true    Ran Out of Food in the Last Year: Never true  Transportation Needs: No Transportation Needs (05/07/2023)   Received from [facility] - Transportation    Lack of Transportation (Medical): No    Lack of Transportation (Non-Medical): No  Physical Activity: Insufficiently Active (03/26/2023)   Exercise Vital Sign    Days of Exercise per Week: 3 days    Minutes of Exercise per Session: 30 min  Stress: No Stress Concern Present (03/26/2023)   [employer]  of Occupational Health - Occupational Stress Questionnaire    Feeling of Stress : Not at all  Social Connections: Unknown (04/21/2023)   Received from [facility]   Social Network    Social Network: Not on file  Intimate Partner Violence: Unknown (04/21/2023)   Received from [facility]   HITS    Physically Hurt: Not on file    Insult or Talk Down To: Not on file    Threaten Physical Harm: Not on file    Scream or Curse: Not on file    Outpatient Encounter Medications as of 09/03/2023  Medication Sig   albuterol (PROVENTIL) (2.5 MG/3ML) 0.083%  nebulizer solution USE 1 VIAL IN NEBULIZER EVERY 6 HOURS AS NEEDED FOR WHEEZING FOR SHORTNESS OF BREATH   albuterol (VENTOLIN HFA) 108 (90 Base) MCG/ACT inhaler Inhale 2 puffs into the lungs every 6 (six) hours as needed for wheezing or shortness of breath.   aspirin EC 81 MG tablet Take 81 mg by mouth daily. Swallow whole.   atorvastatin (LIPITOR) 40 MG tablet Take 40 mg by mouth daily.   Budeson-Glycopyrrol-Formoterol (BREZTRI AEROSPHERE) 160-9-4.8 MCG/ACT AERO Inhale 2 puffs into the lungs in the morning and at bedtime.   famotidine (PEPCID) 20 MG tablet Take 20 mg by mouth 2 (two) times daily.   furosemide (LASIX) 20 MG tablet TAKE 1 TABLET DAILY AS NEEDED FOR EDEMA (SWELLING).   ketorolac (ACULAR) 0.5 % ophthalmic solution PLACE 1 DROP INTO THE LEFT EYE 4 TIMES DAILY.   metoprolol tartrate (LOPRESSOR) 25 MG tablet Take 1 tablet (25 mg total) by mouth 2 (two) times daily.   omeprazole (PRILOSEC) 20 MG capsule Take 20 mg by mouth daily.   polyethylene glycol powder (GLYCOLAX/MIRALAX) 17 GM/SCOOP powder Take 17 g by mouth daily.   REPATHA SURECLICK 140 MG/ML SOAJ INJECT 140 MG SUBCUTANEOUSLY ONCE EVERY 14 DAYS   warfarin (COUMADIN) 5 MG tablet TAKE 1/2 TO 1 TABLET BY MOUTH ONCE DAILY AS DIRECTED BY COUMADIN CLINIC   Facility-Administered Encounter Medications as of 09/03/2023  Medication   cyanocobalamin (VITAMIN B12) injection 1,000 mcg     Allergies  Allergen Reactions   Asa [Aspirin]     GI upset   Niacin     hot flashes   Pravastatin     myalgias   Rosuvastatin     myalgias   Simvastatin     Myalgias    Tramadol Other (See Comments)    hallucinations    Pertinent ROS per HPI, otherwise unremarkable      Objective:  BP 118/63   Pulse 78   Temp (!) 97.2 F (36.2 C)   Ht 5\' 6"  (1.676 m)   Wt 163 lb 6.4 oz (74.1 kg)   SpO2 94%   BMI 26.37 kg/m    Wt Readings from Last 3 Encounters:  09/03/23 163 lb 6.4 oz (74.1 kg)  08/08/23 161 lb (73 kg)  07/23/23 161 lb 6.4 oz (73.2 kg)    Physical Exam Vitals and nursing note reviewed.  Constitutional:      General: He is not in acute distress.    Appearance: He is not ill-appearing or diaphoretic.  HENT:     Head: Normocephalic and atraumatic.     Nose: Nose normal.     Mouth/Throat:     Mouth: Mucous membranes are moist.     Pharynx: Oropharynx is clear.  Eyes:     Conjunctiva/sclera: Conjunctivae normal.     Pupils: Pupils are equal, round, and reactive to light.  Cardiovascular:     Rate and Rhythm: Normal rate. Rhythm irregularly irregular.  Pulmonary:     Effort: Pulmonary effort is normal.     Breath sounds: Normal breath sounds.  Abdominal:     General: There is distension.     Tenderness: There is no abdominal tenderness.  Musculoskeletal:     Right wrist: Normal.     Right hand: Tenderness present. No swelling, deformity, lacerations or bony tenderness. Decreased range of motion. Normal strength. Normal sensation. There is no disruption of two-point discrimination. Normal capillary refill. Normal pulse.     Cervical back: Normal and neck supple.  Thoracic back: Decreased range of motion.     Lumbar back: Decreased range of motion.     Right lower leg: No edema.     Left lower leg: No edema.  Skin:    General: Skin is warm and dry.     Capillary Refill: Capillary refill takes less than 2 seconds.  Neurological:     General: No  focal deficit present.     Mental Status: He is alert and oriented to person, place, and time.     Gait: Gait abnormal (in wheelchair).  Psychiatric:        Mood and Affect: Mood normal.        Behavior: Behavior normal.        Thought Content: Thought content normal.        Judgment: Judgment normal.    Physical Exam   MUSCULOSKELETAL: Tenderness in the right hand near the thumb, likely tendonitis.        Results for orders placed or performed in visit on 09/03/23  Microscopic Examination   Collection Time: 09/03/23 12:03 PM   Urine  Result Value Ref Range   WBC, UA 0-5 0 - 5 /hpf   RBC, Urine None seen 0 - 2 /hpf   Epithelial Cells (non renal) 0-10 0 - 10 /hpf   Renal Epithel, UA None seen None seen /hpf   Mucus, UA Present (A) Not Estab.   Bacteria, UA None seen None seen/Few  Urinalysis, Routine w reflex microscopic   Collection Time: 09/03/23 12:03 PM  Result Value Ref Range   Specific Gravity, UA >1.030 (H) 1.005 - 1.030   pH, UA 5.0 5.0 - 7.5   Color, UA Yellow Yellow   Appearance Ur Clear Clear   Leukocytes,UA Negative Negative   Protein,UA 1+ (A) Negative/Trace   Glucose, UA Negative Negative   Ketones, UA Trace (A) Negative   RBC, UA Negative Negative   Bilirubin, UA Negative Negative   Urobilinogen, Ur 0.2 0.2 - 1.0 mg/dL   Nitrite, UA Negative Negative   Microscopic Examination See below:      X-Ray: right hand: No acute findings. Preliminary x-ray reading by Kari Baars, FNP-C, [facility].   Pertinent labs & imaging results that were available during my care of the patient were reviewed by me and considered in my medical decision making.  Assessment & Plan:  Sante was seen today for hand pain and back pain.  Diagnoses and all orders for this visit:  Right hand pain -     DG Hand Complete Right  Memory changes -     CT HEAD WO CONTRAST ( ); Future -     CBC with Differential/Platelet -     BMP8+EGFR  Decreased urination -     CBC with  Differential/Platelet -     BMP8+EGFR -     Urinalysis, Routine w reflex microscopic -     Microalbumin / creatinine urine ratio -     CT ABDOMEN PELVIS W CONTRAST; Future -     Microscopic Examination  Abdominal bloating -     CBC with Differential/Platelet -     BMP8+EGFR -     Urinalysis, Routine w reflex microscopic -     Microalbumin / creatinine urine ratio -     CT ABDOMEN PELVIS W CONTRAST; Future -     Microscopic Examination  Generalized abdominal pain -     CBC with Differential/Platelet -     BMP8+EGFR -     Urinalysis,  Routine w reflex microscopic -     Microalbumin / creatinine urine ratio -     CT ABDOMEN PELVIS W CONTRAST; Future -     Microscopic Examination  Fall, subsequent encounter -     CT HEAD WO CONTRAST ( ); Future -     DG Hand Complete Right -     CT ABDOMEN PELVIS W CONTRAST; Future     Assessment and Plan    Hand Pain Chronic pain in the right hand near the thumb area since a fall, likely tendonitis due to tendon stretching and aggravation. - Order x-ray of the hand to assess for fractures or other injuries.  Back Pain Chronic back pain following a fall with known fractures, causing difficulty walking. Recovery is prolonged with age and requires increased mobility despite discomfort. - Encourage increased mobility to aid recovery.  Memory Loss Memory issues, including difficulty remembering personal information and computer passwords, present since the fall. Differential diagnosis includes post-fall cognitive changes versus age-related memory decline. - Order CT of the head to rule out intracranial injury. - Discuss potential age-related memory decline and possibility of dementia with neurology.  Foot Drop Persistent foot drop and leg shaking since the fall, affecting mobility. Continued evaluation by neurology is necessary. - Address with neurology during the upcoming visit.  Urinary Retention Decreased urinary output and bloating  since the fall, with no dysuria or hematuria. Reports feeling bloated and has experienced weight loss despite bloating. - Obtain urine sample to check for infection and protein levels. - Order blood work to assess renal function, including creatinine levels.  Constipation Chronic constipation with bowel movements every 3-7 days. Reports bloating, possibly related to constipation. - Recommend daily use of Miralax with plenty of water to maintain regular bowel movements.  Follow-up Coordination with neurology and orthopedics is necessary for ongoing management. - Schedule CT of the abdomen and head. - Perform hand x-ray, urine, and blood work today. - Coordinate with neurology and orthopedics for ongoing management.          Continue all other maintenance medications.  Follow up plan: Return if symptoms worsen or fail to improve.   Continue healthy lifestyle choices, including diet (rich in fruits, vegetables, and lean proteins, and low in salt and simple carbohydrates) and exercise (at least 30 minutes of moderate physical activity daily).    The above assessment and management plan was discussed with the patient. The patient verbalized understanding of and has agreed to the management plan. Patient is aware to call the clinic if they develop any new symptoms or if symptoms persist or worsen. Patient is aware when to return to the clinic for a follow-up visit. Patient educated on when it is appropriate to go to the emergency department.   Kari Baars, FNP-C [facility] [phone]

## 2023-09-03 NOTE — Telephone Encounter (Signed)
 Can you send me entire PAP for Breztri? Patient to re-enroll

## 2023-09-03 NOTE — Progress Notes (Signed)
   09/03/2023 Name: [name] MRN: [id number] DOB: September 16, 1940  Chief Complaint  Patient presents with   Medication Management    Breztri    [name] is a 83 y.o. year old male who was referred for medication management by their primary care provider, Rakes, Doralee Albino, FNP. They presented for a face to face visit today.   Enrollment for Breztri PAP completed via [place] patient assistance, will escribe to [employer] in EPIC; sample given Patient controlled on current regimen (sees pulm) Healthwell grant printed for patient (repatha) he requested copy; heart care mailed to him as well     Kieth Brightly, PharmD, BCACP, CPP Clinical Pharmacist, [facility]

## 2023-09-04 ENCOUNTER — Ambulatory Visit: Attending: Cardiology | Admitting: *Deleted

## 2023-09-04 DIAGNOSIS — I4891 Unspecified atrial fibrillation: Secondary | ICD-10-CM

## 2023-09-04 DIAGNOSIS — Z5181 Encounter for therapeutic drug level monitoring: Secondary | ICD-10-CM | POA: Diagnosis not present

## 2023-09-04 LAB — BMP8+EGFR
BUN/Creatinine Ratio: 17 (ref 10–24)
BUN: 18 mg/dL (ref 8–27)
CO2: 26 mmol/L (ref 20–29)
Calcium: 9.4 mg/dL (ref 8.6–10.2)
Chloride: 106 mmol/L (ref 96–106)
Creatinine, Ser: 1.04 mg/dL (ref 0.76–1.27)
Glucose: 110 mg/dL — ABNORMAL HIGH (ref 70–99)
Potassium: 3.9 mmol/L (ref 3.5–5.2)
Sodium: 146 mmol/L — ABNORMAL HIGH (ref 134–144)
eGFR: 71 mL/min/{1.73_m2} (ref >59)

## 2023-09-04 LAB — CBC WITH DIFFERENTIAL/PLATELET
Basophils Absolute: 0 10*3/uL (ref 0.0–0.2)
Basos: 1 %
EOS (ABSOLUTE): 0.1 10*3/uL (ref 0.0–0.4)
Eos: 1 %
Hematocrit: 43.1 % (ref 37.5–51.0)
Hemoglobin: 14 g/dL (ref 13.0–17.7)
Immature Grans (Abs): 0 10*3/uL (ref 0.0–0.1)
Immature Granulocytes: 0 %
Lymphocytes Absolute: 2.4 10*3/uL (ref 0.7–3.1)
Lymphs: 29 %
MCH: 29.7 pg (ref 26.6–33.0)
MCHC: 32.5 g/dL (ref 31.5–35.7)
MCV: 92 fL (ref 79–97)
Monocytes Absolute: 0.6 10*3/uL (ref 0.1–0.9)
Monocytes: 8 %
Neutrophils Absolute: 5 10*3/uL (ref 1.4–7.0)
Neutrophils: 61 %
Platelets: 222 10*3/uL (ref 150–450)
RBC: 4.71 x10E6/uL (ref 4.14–5.80)
RDW: 14.5 % (ref 11.6–15.4)
WBC: 8.1 10*3/uL (ref 3.4–10.8)

## 2023-09-04 LAB — MICROALBUMIN / CREATININE URINE RATIO
Creatinine, Urine: 344.6 mg/dL
Microalb/Creat Ratio: 11 mg/g{creat} (ref 0–29)
Microalbumin, Urine: 37.7 ug/mL

## 2023-09-04 LAB — POCT INR: INR: 1.2 — AB (ref 2.0–3.0)

## 2023-09-04 NOTE — Patient Instructions (Signed)
 Take warfarin 1 1/2 tablets tonight then increase dose to 1 tablet daily except 1/2 on Tuesdays and Saturdays Recheck in 1 wk

## 2023-09-06 ENCOUNTER — Ambulatory Visit: Admitting: Family Medicine

## 2023-09-06 ENCOUNTER — Other Ambulatory Visit (HOSPITAL_COMMUNITY): Payer: Self-pay

## 2023-09-06 ENCOUNTER — Telehealth: Payer: Self-pay

## 2023-09-06 ENCOUNTER — Ambulatory Visit (INDEPENDENT_AMBULATORY_CARE_PROVIDER_SITE_OTHER): Payer: Medicare Other | Admitting: *Deleted

## 2023-09-06 DIAGNOSIS — E538 Deficiency of other specified B group vitamins: Secondary | ICD-10-CM | POA: Diagnosis not present

## 2023-09-06 NOTE — Progress Notes (Signed)
 Pharmacy Medication Assistance Program Note    09/17/2023  Patient ID: [name], male   DOB: 09-Apr-1941, 83 y.o.   MRN: [id number]     09/06/2023  Outreach Medication One  Manufacturer Medication One [employer]  [employer] [facility] Bretztri  Type of Radiographer, therapeutic Assistance  Date Application Sent to Prescriber 09/06/2023  Date Application Received From Provider 09/12/2023  Date Application Submitted to Manufacturer 09/17/2023  Method Application Sent to Manufacturer Fax     Submitted to company  Please send a 90 day RX to [employer] for assistance program. Thanks!

## 2023-09-06 NOTE — Progress Notes (Signed)
 Patient is in office today for a nurse visit for B12 Injection. Patient Injection was given in the  Left deltoid. Patient tolerated injection well.

## 2023-09-10 ENCOUNTER — Ambulatory Visit: Attending: Cardiology | Admitting: *Deleted

## 2023-09-10 DIAGNOSIS — I4891 Unspecified atrial fibrillation: Secondary | ICD-10-CM | POA: Diagnosis not present

## 2023-09-10 DIAGNOSIS — Z5181 Encounter for therapeutic drug level monitoring: Secondary | ICD-10-CM | POA: Diagnosis not present

## 2023-09-10 LAB — POCT INR: INR: 2.6 (ref 2.0–3.0)

## 2023-09-10 NOTE — Patient Instructions (Signed)
 Continue warfarin 1 tablet daily except 1/2 on Tuesdays and Saturdays Recheck in 1 wk

## 2023-09-11 ENCOUNTER — Telehealth: Payer: Self-pay

## 2023-09-11 NOTE — Telephone Encounter (Signed)
 Copied from CRM [id number]. Topic: Clinical - Lab/Test Results >> Sep 11, 2023  9:24 AM [name] P wrote: Reason for CRM: pt had imaging done of his right hand on 03/18 and has yet to have results, pt is following up and would like for someone to call him about results. Pt stated if he does not answer please leave a message so he can call back. [phone]

## 2023-09-11 NOTE — Telephone Encounter (Signed)
Called for report. 

## 2023-09-17 ENCOUNTER — Other Ambulatory Visit: Payer: Self-pay | Admitting: Family Medicine

## 2023-09-17 ENCOUNTER — Telehealth: Payer: Self-pay

## 2023-09-17 ENCOUNTER — Ambulatory Visit: Attending: Cardiology | Admitting: *Deleted

## 2023-09-17 DIAGNOSIS — Z5181 Encounter for therapeutic drug level monitoring: Secondary | ICD-10-CM | POA: Diagnosis not present

## 2023-09-17 DIAGNOSIS — J449 Chronic obstructive pulmonary disease, unspecified: Secondary | ICD-10-CM

## 2023-09-17 DIAGNOSIS — I4891 Unspecified atrial fibrillation: Secondary | ICD-10-CM | POA: Diagnosis not present

## 2023-09-17 LAB — POCT INR: INR: 4.2 — AB (ref 2.0–3.0)

## 2023-09-17 MED ORDER — BREZTRI AEROSPHERE 160-9-4.8 MCG/ACT IN AERO: 2.0000 | INHALATION_SPRAY | Freq: Two times a day (BID) | RESPIRATORY_TRACT | 3 refills | Status: DC

## 2023-09-17 NOTE — Telephone Encounter (Signed)
 Copied from CRM [id number]. Topic: Clinical - Request for Lab/Test Order >> Sep 17, 2023 12:52 PM Ja-Kwan M wrote: Reason for CRM: Patient's son Minerva Areola stated that they were told that there is no order for CT of the head. Eric requests call back at [phone]

## 2023-09-17 NOTE — Telephone Encounter (Signed)
Son aware and verbalizes understanding.  

## 2023-09-17 NOTE — Addendum Note (Signed)
 Addended by: Sonny Masters on: 09/17/2023 07:41 AM   Modules accepted: Orders

## 2023-09-17 NOTE — Patient Instructions (Signed)
 Hold warfarin tonight then decrease dose to 1/2 tablet daily except 1 tablet on Mondays, Wednesdays and Fridays Recheck in 1 wk

## 2023-09-24 ENCOUNTER — Ambulatory Visit (HOSPITAL_COMMUNITY)
Admission: RE | Admit: 2023-09-24 | Discharge: 2023-09-24 | Disposition: A | Discharge status: Home / Self Care | Source: Ambulatory Visit | Attending: Family Medicine | Admitting: Family Medicine

## 2023-09-24 DIAGNOSIS — R413 Other amnesia: Secondary | ICD-10-CM | POA: Insufficient documentation

## 2023-09-24 DIAGNOSIS — I700 Atherosclerosis of aorta: Secondary | ICD-10-CM | POA: Diagnosis not present

## 2023-09-24 DIAGNOSIS — R1084 Generalized abdominal pain: Secondary | ICD-10-CM | POA: Diagnosis present

## 2023-09-24 DIAGNOSIS — R140 Abdominal distension (gaseous): Secondary | ICD-10-CM | POA: Diagnosis not present

## 2023-09-24 DIAGNOSIS — K7689 Other specified diseases of liver: Secondary | ICD-10-CM | POA: Insufficient documentation

## 2023-09-24 DIAGNOSIS — D1771 Benign lipomatous neoplasm of kidney: Secondary | ICD-10-CM | POA: Insufficient documentation

## 2023-09-24 DIAGNOSIS — W19XXXD Unspecified fall, subsequent encounter: Secondary | ICD-10-CM | POA: Insufficient documentation

## 2023-09-24 DIAGNOSIS — M4856XA Collapsed vertebra, not elsewhere classified, lumbar region, initial encounter for fracture: Secondary | ICD-10-CM | POA: Insufficient documentation

## 2023-09-24 DIAGNOSIS — R34 Anuria and oliguria: Secondary | ICD-10-CM | POA: Insufficient documentation

## 2023-09-24 MED ORDER — IOHEXOL 300 MG/ML  SOLN
100.0000 mL | Freq: Once | INTRAMUSCULAR | Status: AC | PRN
  Administered 2023-09-24: 100 mL via INTRAVENOUS

## 2023-09-25 ENCOUNTER — Telehealth: Payer: Self-pay

## 2023-09-25 NOTE — Telephone Encounter (Signed)
 There is no orders in our system where michelle has ordered an xray or that our office has contacted them to schedule one

## 2023-09-25 NOTE — Telephone Encounter (Signed)
 Copied from CRM [id number]. Topic: Clinical - Lab/Test Results >> Sep 25, 2023  2:02 PM Elle L wrote: Reason for CRM: The patient's daughter, Maxi Rodas, states that the patient received a call regarding scheduling another x-ray. He states he has not gotten the results from his x-ray on 2/14. His call back number is [phone]. If he is needing another x-ray, she is requesting that it is scheduled for 4/17.

## 2023-09-30 ENCOUNTER — Encounter

## 2023-10-02 ENCOUNTER — Ambulatory Visit: Attending: Cardiology | Admitting: *Deleted

## 2023-10-02 DIAGNOSIS — Z5181 Encounter for therapeutic drug level monitoring: Secondary | ICD-10-CM

## 2023-10-02 DIAGNOSIS — I4891 Unspecified atrial fibrillation: Secondary | ICD-10-CM

## 2023-10-02 LAB — POCT INR: INR: 1.7 — AB (ref 2.0–3.0)

## 2023-10-02 MED ORDER — WARFARIN SODIUM 5 MG PO TABS: ORAL_TABLET | ORAL | 1 refills | Status: DC

## 2023-10-02 NOTE — Patient Instructions (Signed)
 Take warfarin 1 1/2 tablets tonight then increase dose to 1 tablet daily except 1/2 tablet on Sundays, Tuesdays and Thursdays Recheck in 2 wks

## 2023-10-03 ENCOUNTER — Encounter

## 2023-10-03 ENCOUNTER — Encounter: Payer: Self-pay | Admitting: Family Medicine

## 2023-10-03 ENCOUNTER — Ambulatory Visit (INDEPENDENT_AMBULATORY_CARE_PROVIDER_SITE_OTHER): Payer: Medicare HMO | Admitting: Family Medicine

## 2023-10-03 VITALS — BP 148/88 | HR 68 | Temp 97.6°F | Ht 66.0 in

## 2023-10-03 DIAGNOSIS — E538 Deficiency of other specified B group vitamins: Secondary | ICD-10-CM

## 2023-10-03 DIAGNOSIS — N182 Chronic kidney disease, stage 2 (mild): Secondary | ICD-10-CM

## 2023-10-03 DIAGNOSIS — Z7901 Long term (current) use of anticoagulants: Secondary | ICD-10-CM

## 2023-10-03 DIAGNOSIS — S32040G Wedge compression fracture of fourth lumbar vertebra, subsequent encounter for fracture with delayed healing: Secondary | ICD-10-CM

## 2023-10-03 DIAGNOSIS — E782 Mixed hyperlipidemia: Secondary | ICD-10-CM | POA: Diagnosis not present

## 2023-10-03 DIAGNOSIS — J449 Chronic obstructive pulmonary disease, unspecified: Secondary | ICD-10-CM | POA: Diagnosis not present

## 2023-10-03 DIAGNOSIS — E559 Vitamin D deficiency, unspecified: Secondary | ICD-10-CM | POA: Diagnosis not present

## 2023-10-03 DIAGNOSIS — R7303 Prediabetes: Secondary | ICD-10-CM

## 2023-10-03 DIAGNOSIS — M79641 Pain in right hand: Secondary | ICD-10-CM

## 2023-10-03 LAB — BAYER DCA HB A1C WAIVED: HB A1C (BAYER DCA - WAIVED): 5.9 % — ABNORMAL HIGH (ref 4.8–5.6)

## 2023-10-03 MED ORDER — METOPROLOL TARTRATE 25 MG PO TABS: 25.0000 mg | ORAL_TABLET | Freq: Two times a day (BID) | ORAL | 1 refills | Status: DC

## 2023-10-03 MED ORDER — PREDNISONE 20 MG PO TABS: 40.0000 mg | ORAL_TABLET | Freq: Every day | ORAL | 0 refills | Status: AC

## 2023-10-03 NOTE — Progress Notes (Signed)
 Subjective:  Patient ID: [name], male    DOB: Mar 18, 1941, 83 y.o.   MRN: [id number]  Patient Care Team: Sonny Masters, FNP as PCP - General (Family Medicine) Wyline Mood, Dorothe Pea, MD as PCP - Cardiology (Cardiology) Wyline Mood Dorothe Pea, MD as Consulting Physician (Cardiology) Bjorn Pippin, MD as Attending Physician (Urology) Danella Maiers, [facility] (Pharmacist)   Chief Complaint:  Annual Exam   HPI: [name] is a 83 y.o. male presenting on 10/03/2023 for Annual Exam   Discussed the use of AI scribe software for clinical note transcription with the patient, who gave verbal consent to proceed.  History of Present Illness   Sean REHM is an 83 year old male who presents for ongoing pain.  He experiences ongoing pain due to a spinal fracture and is currently awaiting a neurosurgery consult. The pain is significant enough to require the use of a wheelchair.  He has ongoing right wrist pain attributed to significant osteoarthritis, as noted on imaging. He has been following up with orthopedics and is currently using a splint on his hand. Tylenol Arthritis has not been effective in managing his pain, and he experiences significant hallucinations with narcotic pain medications.  He is receiving monthly vitamin B12 injections for a deficiency, which he feels has been beneficial.  His COPD is stable with no significant increase in symptoms, and he is tolerating his current management and treatment well.  His cholesterol levels are well-controlled, and he is tolerating medications without significant muscle aches or pain.  His anticoagulation therapy for atrial fibrillation is managed by the AFib clinic, with no recent adjustments in dosing.  He has prediabetes but reports no increased hunger, thirst, or urination.  He requires a refill of metoprolol for blood pressure management, which is effectively maintaining his heart rate above sixty without causing significant  fatigue or dizziness.          Relevant past medical, surgical, family, and social history reviewed and updated as indicated.  Allergies and medications reviewed and updated. Data reviewed: Chart in Epic.   Past Medical History:  Diagnosis Date   Anxiety disorder    Benign prostatic hypertrophy    Bilateral cataracts    CAD (coronary artery disease)    Catheterization 2009, mild CAD   Carotid artery disease (HCC)    Doppler, September, 2011, less than 50% bilateral   COPD (chronic obstructive pulmonary disease) (HCC)    CVA (cerebral infarction)    Hospitalization.  MRI...    Left anterior cerebral artery territory, September, 2011... Coumadin started   DYSLIPIDEMIA 11/01/2009   Qualifier: Diagnosis of  By: Myrtis Ser, MD, [facility], Lemmie Evens    Ejection fraction    EF 60%   Ejection fraction    EF 55-60%, echo, September, 2011   Gastroesophageal reflux disease    History of deep venous thrombosis (DVT) of distal vein of left lower extremity 08/05/2020   History of left inguinal hernia    Hyperlipidemia    Persistent atrial fibrillation (HCC)        Ptosis of eyelid, left    Surgery in the past   Sweating abnormality    Episodes of sweating appeared to be related to a delayed reaction to niacin   Vitamin D insufficiency     Past Surgical History:  Procedure Laterality Date   APPENDECTOMY     CATARACT EXTRACTION, BILATERAL     INGUINAL HERNIA REPAIR Left 07/02/2019   INGUINAL HERNIA REPAIR Left 07/01/2020  Procedure: LEFT INGUINAL HERNIA REPAIR WITH MESH;  Surgeon: Lucretia Roers, MD;  Location: [facility];  Service: General;  Laterality: Left;   KNEE SURGERY Right 2019   torn ligament?    TRANSURETHRAL RESECTION OF PROSTATE N/A 08/19/2018   Procedure: TRANSURETHRAL RESECTION OF THE PROSTATE (TURP) OF PROSTATE ABCESS;  Surgeon: Sebastian Ache, MD;  Location: [facility];  Service: Urology;  Laterality: N/A;    Social History   Socioeconomic History   Marital status: Married     Spouse name: [name]   Number of children: 5   Years of education: 11   Highest education level: 11th grade  Occupational History   Occupation: Retired Biochemist, clinical   Tobacco Use   Smoking status: Former    Current packs/day: 0.00    Average packs/day: 3.0 packs/day for 50.0 years (150.0 ttl pk-yrs)    Types: Cigarettes    Start date: 03/18/1940    Quit date: 03/18/1990    Years since quitting: 33.5    Passive exposure: Past   Smokeless tobacco: Former    Types: Chew    Quit date: 06/18/2000  Vaping Use   Vaping status: Never Used  Substance and Sexual Activity   Alcohol use: No    Alcohol/week: 0.0 standard drinks of alcohol   Drug use: No   Sexual activity: Not Currently    Partners: Female  Other Topics Concern   Not on file  Social History Narrative   Quit smoking in 1993. No significant alcohol abuse. Retired from trucking.    Social Drivers of Corporate investment banker Strain: Low Risk  (05/07/2023)   Received from [facility]   Overall Financial Resource Strain (CARDIA)    Difficulty of Paying Living Expenses: Not hard at all  Food Insecurity: No Food Insecurity (05/07/2023)   Received from [facility]   Hunger Vital Sign    Worried About Running Out of Food in the Last Year: Never true    Ran Out of Food in the Last Year: Never true  Transportation Needs: No Transportation Needs (05/07/2023)   Received from [facility] - Transportation    Lack of Transportation (Medical): No    Lack of Transportation (Non-Medical): No  Physical Activity: Insufficiently Active (03/26/2023)   Exercise Vital Sign    Days of Exercise per Week: 3 days    Minutes of Exercise per Session: 30 min  Stress: No Stress Concern Present (03/26/2023)   [employer] of Occupational Health - Occupational Stress Questionnaire    Feeling of Stress : Not at all  Social Connections: Unknown (04/21/2023)   Received from [facility]   Social Network    Social  Network: Not on file  Intimate Partner Violence: Unknown (04/21/2023)   Received from [facility]   HITS    Physically Hurt: Not on file    Insult or Talk Down To: Not on file    Threaten Physical Harm: Not on file    Scream or Curse: Not on file    Outpatient Encounter Medications as of 10/03/2023  Medication Sig   albuterol (PROVENTIL) (2.5 MG/3ML) 0.083% nebulizer solution USE 1 VIAL IN NEBULIZER EVERY 6 HOURS AS NEEDED FOR WHEEZING FOR SHORTNESS OF BREATH   albuterol (VENTOLIN HFA) 108 (90 Base) MCG/ACT inhaler Inhale 2 puffs into the lungs every 6 (six) hours as needed for wheezing or shortness of breath.   aspirin EC 81 MG tablet Take 81 mg by mouth daily. Swallow whole.  atorvastatin (LIPITOR) 40 MG tablet Take 40 mg by mouth daily.   budeson-glycopyrrolate-formoterol (BREZTRI AEROSPHERE) 160-9-4.8 MCG/ACT AERO Inhale 2 puffs into the lungs in the morning and at bedtime.   famotidine (PEPCID) 20 MG tablet Take 20 mg by mouth 2 (two) times daily.   furosemide (LASIX) 20 MG tablet TAKE 1 TABLET DAILY AS NEEDED FOR EDEMA (SWELLING).   ketorolac (ACULAR) 0.5 % ophthalmic solution PLACE 1 DROP INTO THE LEFT EYE 4 TIMES DAILY.   omeprazole (PRILOSEC) 20 MG capsule Take 20 mg by mouth daily.   polyethylene glycol powder (GLYCOLAX/MIRALAX) 17 GM/SCOOP powder Take 17 g by mouth daily.   predniSONE (DELTASONE) 20 MG tablet Take 2 tablets (40 mg total) by mouth daily with breakfast for 5 days. 2 po daily for 5 days   REPATHA SURECLICK 140 MG/ML SOAJ INJECT 140 MG SUBCUTANEOUSLY ONCE EVERY 14 DAYS   warfarin (COUMADIN) 5 MG tablet TAKE 1/2 TO 1 TABLET BY MOUTH ONCE DAILY AS DIRECTED BY COUMADIN CLINIC   metoprolol tartrate (LOPRESSOR) 25 MG tablet Take 1 tablet (25 mg total) by mouth 2 (two) times daily.   [DISCONTINUED] metoprolol tartrate (LOPRESSOR) 25 MG tablet Take 1 tablet (25 mg total) by mouth 2 (two) times daily.   Facility-Administered Encounter Medications as of 10/03/2023   Medication   cyanocobalamin (VITAMIN B12) injection 1,000 mcg    Allergies  Allergen Reactions   Asa [Aspirin]     GI upset   Niacin     hot flashes   Pravastatin     myalgias   Rosuvastatin     myalgias   Simvastatin     Myalgias    Tramadol Other (See Comments)    hallucinations    Pertinent ROS per HPI, otherwise unremarkable      Objective:  BP (!) 148/88 Comment: home reading  Pulse 68   Temp 97.6 F (36.4 C)   Ht 5\' 6"  (1.676 m)   SpO2 98%   BMI 26.37 kg/m    Wt Readings from Last 3 Encounters:  09/03/23 163 lb 6.4 oz (74.1 kg)  08/08/23 161 lb (73 kg)  07/23/23 161 lb 6.4 oz (73.2 kg)    Physical Exam Vitals and nursing note reviewed.  Constitutional:      General: He is not in acute distress.    Appearance: He is ill-appearing (chronically ill). He is not toxic-appearing or diaphoretic.  HENT:     Head: Normocephalic and atraumatic.     Nose: Nose normal.     Mouth/Throat:     Mouth: Mucous membranes are moist.  Eyes:     Pupils: Pupils are equal, round, and reactive to light.  Cardiovascular:     Rate and Rhythm: Normal rate.  Pulmonary:     Effort: Pulmonary effort is normal.  Musculoskeletal:     Comments: Brace on right wrist  Skin:    General: Skin is warm and dry.  Neurological:     General: No focal deficit present.     Mental Status: He is alert and oriented to person, place, and time.     Gait: Gait abnormal (in wheelchair).  Psychiatric:        Mood and Affect: Mood normal.        Behavior: Behavior normal.        Thought Content: Thought content normal.        Judgment: Judgment normal.      Pertinent labs & imaging results that were available during my care of  the patient were reviewed by me and considered in my medical decision making.  Assessment & Plan:  Jibreel was seen today for annual exam.  Diagnoses and all orders for this visit:  Mixed hyperlipidemia -     CMP14+EGFR -     Lipid panel  Vitamin D  insufficiency -     CMP14+EGFR -     Vitamin D, 25-hydroxy  Vitamin B 12 deficiency -     CBC with Differential/Platelet -     Thyroid Panel With TSH  COPD GOLD 2 with reversibility  -     CBC with Differential/Platelet  Chronic anticoagulation -     CBC with Differential/Platelet  Prediabetes -     CBC with Differential/Platelet -     CMP14+EGFR -     Bayer DCA Hb A1c Waived  Chronic kidney disease (CKD), stage II (mild) -     CBC with Differential/Platelet -     CMP14+EGFR  Right hand pain -     predniSONE (DELTASONE) 20 MG tablet; Take 2 tablets (40 mg total) by mouth daily with breakfast for 5 days. 2 po daily for 5 days  Compression fracture of L4 vertebra with delayed healing, subsequent encounter -     predniSONE (DELTASONE) 20 MG tablet; Take 2 tablets (40 mg total) by mouth daily with breakfast for 5 days. 2 po daily for 5 days  Other orders -     metoprolol tartrate (LOPRESSOR) 25 MG tablet; Take 1 tablet (25 mg total) by mouth 2 (two) times daily.     Assessment and Plan    Spinal fracture Ongoing pain due to spinal fracture, leading to wheelchair use. Awaiting neurosurgery consult. Considering injections or cement augmentation. 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 Current management includes a splint. Tylenol arthritis is ineffective, and narcotic pain medications cause hallucinations. - Discuss possibility of a burst of steroids for symptom management - Maintain follow-ups with orthopedic specialties  Chronic Obstructive Pulmonary Disease (COPD) COPD is well-managed with current treatment.  Hypertension Blood pressure is well-controlled with metoprolol. Heart rate remains above sixty with no fatigue or dizziness. - Refill metoprolol prescription  Atrial Fibrillation Anticoagulation managed by the AFib clinic with no recent  dosing adjustments.  Hyperlipidemia Cholesterol levels are well-managed. Atorvastatin is tolerated without muscle aches or pain.  Prediabetes No increased hunger, thirst, or urination, indicating stable condition.  Vitamin B12 deficiency Receiving monthly vitamin B12 injections, which have been beneficial.  Follow-up Reassessment of ongoing pain and management strategies is necessary. - Follow up in four weeks for reevaluation of ongoing pain          Continue all other maintenance medications.  Follow up plan: Return in about 4 weeks (around 10/31/2023).   Continue healthy lifestyle choices, including diet (rich in fruits, vegetables, and lean proteins, and low in salt and simple carbohydrates) and exercise (at least 30 minutes of moderate physical activity daily).  The above assessment and management plan was discussed with the patient. The patient verbalized understanding of and has agreed to the management plan. Patient is aware to call the clinic if they develop any new symptoms or if symptoms persist or worsen. Patient is aware when to return to the clinic for a follow-up visit. Patient educated on when it is appropriate to go to the emergency department.   Kari Baars, FNP-C [facility] [phone]

## 2023-10-04 LAB — CBC WITH DIFFERENTIAL/PLATELET
Basophils Absolute: 0 10*3/uL (ref 0.0–0.2)
Basos: 0 %
EOS (ABSOLUTE): 0 10*3/uL (ref 0.0–0.4)
Eos: 0 %
Hematocrit: 45.4 % (ref 37.5–51.0)
Hemoglobin: 15 g/dL (ref 13.0–17.7)
Immature Grans (Abs): 0 10*3/uL (ref 0.0–0.1)
Immature Granulocytes: 0 %
Lymphocytes Absolute: 1.9 10*3/uL (ref 0.7–3.1)
Lymphs: 27 %
MCH: 30.5 pg (ref 26.6–33.0)
MCHC: 33 g/dL (ref 31.5–35.7)
MCV: 92 fL (ref 79–97)
Monocytes Absolute: 0.5 10*3/uL (ref 0.1–0.9)
Monocytes: 7 %
Neutrophils Absolute: 4.6 10*3/uL (ref 1.4–7.0)
Neutrophils: 66 %
Platelets: 238 10*3/uL (ref 150–450)
RBC: 4.92 x10E6/uL (ref 4.14–5.80)
RDW: 14.5 % (ref 11.6–15.4)
WBC: 7.1 10*3/uL (ref 3.4–10.8)

## 2023-10-04 LAB — CMP14+EGFR
ALT: 10 IU/L (ref 0–44)
AST: 18 IU/L (ref 0–40)
Albumin: 4.1 g/dL (ref 3.7–4.7)
Alkaline Phosphatase: 78 IU/L (ref 44–121)
BUN/Creatinine Ratio: 19 (ref 10–24)
BUN: 17 mg/dL (ref 8–27)
Bilirubin Total: 0.7 mg/dL (ref 0.0–1.2)
CO2: 25 mmol/L (ref 20–29)
Calcium: 9.2 mg/dL (ref 8.6–10.2)
Chloride: 106 mmol/L (ref 96–106)
Creatinine, Ser: 0.88 mg/dL (ref 0.76–1.27)
Globulin, Total: 2.2 g/dL (ref 1.5–4.5)
Glucose: 99 mg/dL (ref 70–99)
Potassium: 4.3 mmol/L (ref 3.5–5.2)
Sodium: 145 mmol/L — ABNORMAL HIGH (ref 134–144)
Total Protein: 6.3 g/dL (ref 6.0–8.5)
eGFR: 85 mL/min/{1.73_m2} (ref >59)

## 2023-10-04 LAB — LIPID PANEL
Chol/HDL Ratio: 3.5 ratio (ref 0.0–5.0)
Cholesterol, Total: 129 mg/dL (ref 100–199)
HDL: 37 mg/dL — ABNORMAL LOW (ref >39)
LDL Chol Calc (NIH): 68 mg/dL (ref 0–99)
Triglycerides: 139 mg/dL (ref 0–149)
VLDL Cholesterol Cal: 24 mg/dL (ref 5–40)

## 2023-10-04 LAB — THYROID PANEL WITH TSH
Free Thyroxine Index: 1.8 (ref 1.2–4.9)
T3 Uptake Ratio: 28 % (ref 24–39)
T4, Total: 6.3 ug/dL (ref 4.5–12.0)
TSH: 3.06 u[IU]/mL (ref 0.450–4.500)

## 2023-10-04 LAB — VITAMIN D 25 HYDROXY (VIT D DEFICIENCY, FRACTURES): Vit D, 25-Hydroxy: 27.6 ng/mL — ABNORMAL LOW (ref 30.0–100.0)

## 2023-10-07 ENCOUNTER — Telehealth: Payer: Self-pay | Admitting: Family Medicine

## 2023-10-07 NOTE — Telephone Encounter (Signed)
 Spoke with son , will make pt aware of information provided

## 2023-10-07 NOTE — Telephone Encounter (Signed)
 Copied from CRM [id number]. Topic: Clinical - Medical Advice >> Oct 03, 2023  4:40 PM Phil Braun wrote: Reason for CRM:  Pt states he got a message that he is on the purge of being diabetic and he is wanting to know what to do and to discuss this a little more. Please advise.

## 2023-10-08 ENCOUNTER — Ambulatory Visit (INDEPENDENT_AMBULATORY_CARE_PROVIDER_SITE_OTHER)

## 2023-10-08 DIAGNOSIS — E538 Deficiency of other specified B group vitamins: Secondary | ICD-10-CM | POA: Diagnosis not present

## 2023-10-08 NOTE — Progress Notes (Signed)
 Patient is in office today for a nurse visit for B12 Injection. Patient Injection was given in the  Right deltoid. Patient tolerated injection well.

## 2023-10-16 ENCOUNTER — Ambulatory Visit: Attending: Cardiology

## 2023-10-16 ENCOUNTER — Telehealth: Payer: Self-pay | Admitting: Cardiology

## 2023-10-16 ENCOUNTER — Other Ambulatory Visit: Payer: Self-pay

## 2023-10-16 DIAGNOSIS — I4891 Unspecified atrial fibrillation: Secondary | ICD-10-CM

## 2023-10-16 DIAGNOSIS — Z5181 Encounter for therapeutic drug level monitoring: Secondary | ICD-10-CM | POA: Diagnosis not present

## 2023-10-16 LAB — POCT INR: INR: 3.1 — AB (ref 2.0–3.0)

## 2023-10-16 MED ORDER — WARFARIN SODIUM 5 MG PO TABS: ORAL_TABLET | ORAL | 1 refills | Status: DC

## 2023-10-16 NOTE — Patient Instructions (Signed)
 Description   Take 1/2 tablet of Warfarin today, then resume same dosage of Warfarin 1 tablet daily except 1/2 tablet on Sundays, Tuesdays and Thursdays Recheck in 3 wks

## 2023-10-16 NOTE — Telephone Encounter (Signed)
*  STAT* If patient is at the pharmacy, call can be transferred to refill team.   1. Which medications need to be refilled? (please list name of each medication and dose if known)  warfarin (COUMADIN ) 5 MG tablet  2. Which pharmacy/location (including street and city if local pharmacy) is medication to be sent to? [facility] - [place], [place] - [address] Phone: [phone]  Fax: [phone]      3. Do they need a 30 day or 90 day supply? 90

## 2023-10-17 ENCOUNTER — Ambulatory Visit: Payer: Self-pay

## 2023-10-17 ENCOUNTER — Telehealth: Payer: Self-pay | Admitting: Neurology

## 2023-10-17 NOTE — Telephone Encounter (Signed)
 Returned sons call and answered questions.

## 2023-10-17 NOTE — Telephone Encounter (Signed)
 PT called to cancel APPT.

## 2023-10-17 NOTE — Telephone Encounter (Signed)
  Chief Complaint: back pain Symptoms: lower mild back pain Frequency: x months Pertinent Negatives: Patient denies loss bowel or bladder control, fever Disposition: [] ED /[] Urgent Care (no appt availability in office) / [] Appointment(In office/virtual)/ []  [facility]/ [] Home Care/ [] Refused Recommended Disposition /[] [place] [facility]/ [x]  Follow-up with PCP Additional Notes: Patient seen by neurosurgery, Dr Michale Age. He states he was told his back is broken and it will require surgery. Patient states that was 2 weeks ago he was told that and he hasn't heard any updates. He states he has tried calling their office including this morning and just gets a recording. He is asking if his PCP has any updates or is able to reach out to the surgeon for him. Please advise.  Copied from CRM [id number]. Topic: Clinical - Red Word Triage >> Oct 17, 2023  9:53 AM Maryln Sober wrote: Red Word that prompted transfer to Nurse Triage: Patient states he was advised his back was broken on 10/09/2023 and has been having some pain associated with it. Patient initially wanted to speak with Dr. Carlyle Childes to see if he would be scheduled for surgery. Reason for Disposition  Back pain present > 2 weeks  Answer Assessment - Initial Assessment Questions 1. ONSET: "When did the pain begin?"      07/17/23, had a fall/injury.  2. LOCATION: "Where does it hurt?" (upper, mid or lower back)     Lower back pain.  3. SEVERITY: "How bad is the pain?"  (e.g., Scale 1-10; mild, moderate, or severe)   - MILD (1-3): Doesn't interfere with normal activities.    - MODERATE (4-7): Interferes with normal activities or awakens from sleep.    - SEVERE (8-10): Excruciating pain, unable to do any normal activities.      3-4/10.  4. PATTERN: "Is the pain constant?" (e.g., yes, no; constant, intermittent)      He states the pain is mostly when he stands up or walks. When sitting down it does not hurt much.  5. RADIATION: "Does the  pain shoot into your legs or somewhere else?"     He states it radiates down when he tries to walk.  6. CAUSE:  "What do you think is causing the back pain?"      Patient states his Dr Michale Age told him his back is broken and he will need surgery right away. He states they did not tell him anything about a referral and he is concerned because it has been 2 weeks. He states he has called the spinal surgeon multiple times including this morning and has not heard back.  7. BACK OVERUSE:  "Any recent lifting of heavy objects, strenuous work or exercise?"     Denies.  8. MEDICINES: "What have you taken so far for the pain?" (e.g., nothing, acetaminophen , NSAIDS)     Acetaminophen -Codeine 300mg /15mg .  9. NEUROLOGIC SYMPTOMS: "Do you have any weakness, numbness, or problems with bowel/bladder control?"     Generalized weakness.  10. OTHER SYMPTOMS: "Do you have any other symptoms?" (e.g., fever, abdomen pain, burning with urination, blood in urine)       "Slow stream" when urinating.  11. PREGNANCY: "Is there any chance you are pregnant?" "When was your last menstrual period?"       N/A.  Protocols used: Back Pain-A-AH

## 2023-10-18 NOTE — Progress Notes (Unsigned)
 Pharmacy Medication Assistance Program Note    10/18/2023  Patient ID: [name], male   DOB: [birth year], 83 y.o.   MRN: [id number]     09/06/2023  Outreach Medication One  Manufacturer Medication One [employer]  [employer] [facility]  Type of Radiographer, therapeutic Assistance  Date Application Sent to Prescriber 09/06/2023  Date Application Received From Provider 09/12/2023  Date Application Submitted to Manufacturer 09/17/2023  Method Application Sent to Manufacturer Fax  Patient Assistance Determination Approved  Approval Start Date 09/18/2023  Approval End Date 06/17/2024     Patient approved, please send new 90 day RX to [facility]. Thanks!

## 2023-10-21 ENCOUNTER — Telehealth: Payer: Self-pay | Admitting: *Deleted

## 2023-10-21 ENCOUNTER — Ambulatory Visit: Payer: Medicare Other | Admitting: Neurology

## 2023-10-21 NOTE — Telephone Encounter (Signed)
 Dr. Amanda Jungling, routing to you for your awareness as pt has appointment with you on 5/6. Please address request to hold aspirin for L4 Lumbar fusion.  Pharmacy, please address request to hold coumadin .  Thank you, Gerldine Koch, NP-C 10/21/2023, 2:50 PM [address] [place], [place] [address] Office [phone] Fax [phone]

## 2023-10-21 NOTE — Telephone Encounter (Signed)
   Pre-operative Risk Assessment    Patient Name: [name]  DOB: 04-16-1941 MRN: [id number]   Date of last office visit: 03/04/23 DR. Letta Raw Date of next office visit: 10/22/23 DR. BRANCH   Request for Surgical Clearance    Procedure:   L4 LUMBAR FUSION  Date of Surgery:  Clearance TBD                                Surgeon:  DR. KYLE CABBELL Surgeon's Group or Practice Name:  [facility] Phone number:  [phone] Fax number:  [phone] ATTN:VANESSA EXT [id number]   Type of Clearance Requested:   - Medical  - Pharmacy:  Hold Aspirin and Warfarin (Coumadin )     Type of Anesthesia:  General    Additional requests/questions:    Princ[place] Broom   10/21/2023, 10:49 AM

## 2023-10-22 ENCOUNTER — Encounter: Payer: Self-pay | Admitting: Cardiology

## 2023-10-22 ENCOUNTER — Telehealth: Payer: Self-pay | Admitting: Cardiology

## 2023-10-22 ENCOUNTER — Ambulatory Visit: Attending: Cardiology | Admitting: Cardiology

## 2023-10-22 ENCOUNTER — Other Ambulatory Visit: Payer: Self-pay | Admitting: Family Medicine

## 2023-10-22 VITALS — BP 118/68 | HR 66 | Ht 66.0 in | Wt 165.0 lb

## 2023-10-22 DIAGNOSIS — I4891 Unspecified atrial fibrillation: Secondary | ICD-10-CM

## 2023-10-22 DIAGNOSIS — J449 Chronic obstructive pulmonary disease, unspecified: Secondary | ICD-10-CM

## 2023-10-22 DIAGNOSIS — Z01810 Encounter for preprocedural cardiovascular examination: Secondary | ICD-10-CM | POA: Diagnosis not present

## 2023-10-22 DIAGNOSIS — E782 Mixed hyperlipidemia: Secondary | ICD-10-CM | POA: Diagnosis not present

## 2023-10-22 DIAGNOSIS — D6869 Other thrombophilia: Secondary | ICD-10-CM | POA: Diagnosis not present

## 2023-10-22 MED ORDER — WARFARIN SODIUM 5 MG PO TABS: ORAL_TABLET | ORAL | 1 refills | Status: DC

## 2023-10-22 MED ORDER — WARFARIN SODIUM 5 MG PO TABS: ORAL_TABLET | ORAL | 0 refills | Status: DC

## 2023-10-22 MED ORDER — BREZTRI AEROSPHERE 160-9-4.8 MCG/ACT IN AERO: 2.0000 | INHALATION_SPRAY | Freq: Two times a day (BID) | RESPIRATORY_TRACT | 11 refills | Status: DC

## 2023-10-22 NOTE — Patient Instructions (Signed)
 Medication Instructions:  Continue all current medications.   Labwork: none  Testing/Procedures: none  Follow-Up: 6 months   Any Other Special Instructions Will Be Listed Below (If Applicable).   If you need a refill on your cardiac medications before your next appointment, please call your pharmacy.

## 2023-10-22 NOTE — Progress Notes (Signed)
 Clinical Summary Mr. China is a 83 y.o.male seen today for follow up of the following medical problems.      1. Chronic afib -  DOACs were too expensive in the past, he has elected to remain on coumadin .  - some fatigue on lopressor  100mg  bid, has been maintained on lower doses.   - seen by EP, did not meet criteria to consider watchman device    - no recent palpitatinos - compliant with meds - no bleeding on coumadin    2. Carotid stenosis - 02/2015 carotid US : bilateral 1-39% ICA disease.   - 10/2020 mild bilateral disease  CTA: atheromatous change about the carotid bifurcations with  associated stenosis of up to 70% on the left.    3. Hyperlipidemia - joint pains on simvaststin, atorvastatin, rosuvastatin , pravastatin . - he is on repatha , followed lipid clinic 02/2021 TC 122 TG 145 HDL 34 LDL 63 09/2021 TC 113 TG 228 HDL 36 LDL 41 09/2022 TC 116 TG 98 HDL 38 LDL 59 09/2023 TC 129 TG 139 HDL 37 LDL 68     4. CAD - mild disease by cath 2009 - no recent chest pains. No SOB/DOE - phsyically limited to back pain.      5. LE edema - some LE edema at times, has prn lasix . Takes the prn lasix  about 3 times a week.    - swelling up and down. Has lasix  prn, takes about once a week.    6. Aortic regurgitation - moderate AI by echo Jan 2020 - 10/2020 echo mild AI  04/2023 echo [facility]: mild to moderate AI , mild MR    7. 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 DVT  - 07/2020 US  with deep vein thrombosis involving the left common femoral vein, femoral vein, popliteal vein and profundus femoral vein. - occurred after left inguinal hernia repair. Note coumadin  was held in periop period, prior to that his INR had been subtherapeutic.    -if needs to be off in the future would consider bridging.  - he reports pcp working on DOAC assistance. Reports a copay of 30-40$ dollars would be too much     9. Chronic fatigue     10.COPD - followed by Dr  Waymond Hailey  11.Preoperative evaluation - considering lumbar fusion under general anesthesia  - can walk up flight of stairs multiple times a day   12. 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 [place] Past Medical History:  Diagnosis Date   Anxiety disorder    Benign prostatic hypertrophy    Bilateral cataracts    CAD (coronary artery disease)    Catheterization 2009, mild CAD   Carotid artery disease (HCC)    Doppler, September, 2011, less than 50% bilateral   COPD (chronic obstructive pulmonary disease) (HCC)    CVA (cerebral infarction)    Hospitalization.  MRI...    Left anterior cerebral artery territory, September, 2011... 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 08/05/2020   History of left inguinal hernia  Hyperlipidemia    Persistent atrial fibrillation (HCC)        Ptosis of eyelid, left    Surgery in the past   Sweating abnormality    Episodes of sweating appeared to be related to a delayed reaction to niacin   Vitamin D  insufficiency      Allergies  Allergen Reactions   Asa [Aspirin]     GI upset   Niacin     hot flashes   Pravastatin      myalgias   Rosuvastatin      myalgias   Simvastatin      Myalgias    Tramadol  Other (See Comments)    hallucinations     Current Outpatient Medications  Medication Sig Dispense Refill   albuterol  (PROVENTIL ) (2.5 MG/3ML) 0.083% nebulizer solution USE 1 VIAL IN NEBULIZER EVERY 6 HOURS AS NEEDED FOR WHEEZING FOR SHORTNESS OF BREATH 180 mL 11   albuterol  (VENTOLIN  HFA) 108 (90 Base) MCG/ACT inhaler Inhale 2 puffs into the lungs every  6 (six) hours as needed for wheezing or shortness of breath. 8 g 11   aspirin EC 81 MG tablet Take 81 mg by mouth daily. Swallow whole.     atorvastatin (LIPITOR) 40 MG tablet Take 40 mg by mouth daily.     budeson-glycopyrrolate-formoterol  (BREZTRI  AEROSPHERE) 160-9-4.8 MCG/ACT AERO Inhale 2 puffs into the lungs in the morning and at bedtime. 21.4 g 3   famotidine  (PEPCID ) 20 MG tablet Take 20 mg by mouth 2 (two) times daily.     furosemide  (LASIX ) 20 MG tablet TAKE 1 TABLET DAILY AS NEEDED FOR EDEMA (SWELLING). 90 tablet 1   ketorolac  (ACULAR ) 0.5 % ophthalmic solution PLACE 1 DROP INTO THE LEFT EYE 4 TIMES DAILY. 25 mL 0   metoprolol  tartrate (LOPRESSOR ) 25 MG tablet Take 1 tablet (25 mg total) by mouth 2 (two) times daily. 180 tablet 1   omeprazole  (PRILOSEC) 20 MG capsule Take 20 mg by mouth daily.     polyethylene glycol powder (GLYCOLAX /MIRALAX ) 17 GM/SCOOP powder Take 17 g by mouth daily. 3350 g 1   REPATHA  SURECLICK 140 MG/ML SOAJ INJECT 140 MG SUBCUTANEOUSLY ONCE EVERY 14 DAYS 6 mL 0   warfarin (COUMADIN ) 5 MG tablet TAKE 1/2 TO 1 TABLET BY MOUTH ONCE DAILY AS DIRECTED BY COUMADIN  CLINIC 90 tablet 1   Current Facility-Administered Medications  Medication Dose Route Frequency Provider Last Rate Last Admin   cyanocobalamin  (VITAMIN B12) injection 1,000 mcg  1,000 mcg Intramuscular Q30 days Galvin Jules, FNP   1,000 mcg at 10/08/23 1914     Past Surgical History:  Procedure Laterality Date   APPENDECTOMY     CATARACT EXTRACTION, BILATERAL     INGUINAL HERNIA REPAIR Left 07/02/2019   INGUINAL HERNIA REPAIR Left 07/01/2020   Procedure: LEFT INGUINAL HERNIA REPAIR WITH MESH;  Surgeon: Awilda Bogus, MD;  Location: [facility];  Service: General;  Laterality: Left;   KNEE SURGERY Right 2019   torn ligament?    TRANSURETHRAL RESECTION OF PROSTATE N/A 08/19/2018   Procedure: TRANSURETHRAL RESECTION OF THE PROSTATE (TURP) OF PROSTATE ABCESS;  Surgeon: Osborn Blaze, MD;  Location: [facility];  Service: Urology;  Laterality: N/A;     Allergies  Allergen Reactions   Asa [Aspirin]     GI upset   Niacin     hot flashes   Pravastatin      myalgias   Rosuvastatin      myalgias   Simvastatin      Myalgias  Tramadol  Other (See Comments)    hallucinations      Family History  Problem Relation Age of Onset   Heart attack Mother    Other Other        Insignificant for premature CAD   Heart disease Sister    Hyperlipidemia Sister    Kidney disease Son    Heart attack Paternal Grandfather      Social History Mr. Candelaria reports that he quit smoking about 33 years ago. His smoking use included cigarettes. He started smoking about 83 years ago. He has a 150 pack-year smoking history. He has been exposed to tobacco smoke. He quit smokeless tobacco use about 23 years ago.  His smokeless tobacco use included chew. Mr. Means reports no history of alcohol use.    Physical Examination Today's Vitals   10/22/23 0828  BP: 118/68  Pulse: 66  SpO2: 95%  Weight: 165 lb (74.8 kg)  Height: 5\' 6"  (1.676 m)   Body mass index is 26.63 kg/m.  Gen: resting comfortably, no acute distress HEENT: no scleral icterus, pupils equal round and reactive, no palptable cervical adenopathy,  CV: irreg, no mrg, no jvd Resp: Clear to auscultation bilaterally GI: abdomen is soft, non-tender, non-distended, normal bowel sounds, no hepatosplenomegaly MSK: extremities are warm, no edema.  Skin: warm, no rash Neuro:  no focal deficits Psych: appropriate affect   Diagnostic Studies   02/2015 carotid US : bilateral 1-39% ICA disease. Recs for f/u 2 years   02/2008 cath FINDINGS:  The aortic pressure 98/71 with a mean of 83, left ventricular  pressure 99/20.    Coronary angiography.  The left mainstem is very short.  There is no  stenosis present.    The LAD is a large-caliber vessel that courses down and reaches the LV  apex.  The anatomy is a bit unusual as there is a Janos Shampine  off the  proximal LAD that runs the course of an intermediate.  It supplies  multiple Bernell Haynie vessels to the lateral wall.  There has been more of a  traditional appearing diagonal Samra Pesch from further down in the mid-LAD.  The LAD at the origin of the second diagonal Terria Deschepper has a 30% stenosis.  Further down in the vessel, there is minor nonobstructive plaque but no  significant stenosis in the LAD or its Antasia Haider vessels.  There are two  diagonals in the intermediate territory Melvern Ramone but all arising from the  proximal and mid LAD.    The left circumflex is very small.  It courses down the AV groove and  supplies no significant Susa Bones vessels.    Right coronary artery.  The right coronary artery is dominant.  Proximally, there is an eccentric 50% stenosis.  This is a smooth  appearing lesion of the vessels.  The midportion of the right coronary  artery is dilated.  There is a transition zone into more normal-  appearing vessel where there is a 30% stenosis.  There is a large acute  marginal Lynna Zamorano and a moderate size PDA and posterolateral Cesiah Westley.  There are no other significant stenoses noted.  With intracoronary  nitroglycerin, the lesion still appears no worse than 50%.    Left ventriculography shows low normal LV function with an LVEF in the  range of 50-55%.  There was no significant mitral regurgitation.    ASSESSMENT:  1. Mild-to-moderate right coronary artery stenosis as outlined above.  2. Minimal left anterior descending and left circumflex stenosis.  3. Normal left ventricular  function.    Post cat PLAN:  Recommend medical therapy for nonobstructive CAD.  The patient  will resume Coumadin  tonight.  He will drop his aspirin dose to 81 mg.  In reviewing Dr. Ardell Koller notes, the patient may not require long-term  Coumadin .  Recommend aggressive secondary risk reduction for the  patient's diffuse nonobstructive CAD.     08/2016 event monitor Telemetry tracings show rate controlled  atrial fibrillation No symptoms reported     10/2020 echo IMPRESSIONS     1. Left ventricular ejection fraction, by estimation, is 60 to 65%. The  left ventricle has normal function. The left ventricle has no regional  wall motion abnormalities. Left ventricular diastolic parameters are  indeterminate. Elevated left atrial  pressure.   2. Right ventricular systolic function is normal. The right ventricular  size is normal.   3. Left atrial size was severely dilated.   4. Right atrial size was severely dilated.   5. The mitral valve is normal in structure. Trivial mitral valve  regurgitation. No evidence of mitral stenosis.   6. The aortic valve has an indeterminant number of cusps. There is mild  calcification of the aortic valve. There is mild thickening of the aortic  valve. Aortic valve regurgitation is mild. No aortic stenosis is present.   7. The inferior vena cava is normal in size with greater than 50%  respiratory variability, suggesting right atrial pressure of 3 mmHg.   04/2023 [facility]   Left Ventricle: Systolic function is low normal. EF: 55-60%. Ejection  fraction measured by 3D is 58%, which is normal.    Tricuspid Valve: The right ventricular systolic pressure is normal (<36  mmHg).   Aortic Valve: The aortic valve is tricuspid. The leaflets are  moderately thickened. There is moderate sclerosis.    Aortic Valve: Mild to moderate aortic valve regurgitation.    Mitral Valve: There is mild regurgitation.    Left Atrium: Injection of agitated saline documents no interatrial  shunt.   Assessment and Plan   1. Chronic afib/acquired thrombophilia - DOACs have been too expensive, he prefers staying on coumadin  - no symptoms, continue current meds - EKG today shows rate controlled afib   2. CAD - mild disease on prior cath -no recent symptoms, continue current meds   3. Carotid stenosis -continue to monitor, recent CTA in epic from [facility] last year.    4.  Aortic regurgitation -mild to moderate, continue to monitor   5. HYperlipidemia - intolerant to multiple statins - LDL at goal, continue   6.Preoperative evaluation - prior to recent back injury was tolerating greater than 4 METs without any significant symptoms - in 2022 when coumadin  was held for inguinal surgery developed extensive left sided DVT. For this reason would recommend lovenox bridging for upcoming surgery. - ASA was added by neurology to his coumadin  for TIA like symptoms, not from cardiology. In general though would be ok to hold for procedure  - recommend proceeding with surgery with lovenox bridging as listed above.      Laurann Pollock, M.D.

## 2023-10-22 NOTE — Telephone Encounter (Signed)
*  STAT* If patient is at the pharmacy, call can be transferred to refill team.   1. Which medications need to be refilled? (please list name of each medication and dose if known)   warfarin (COUMADIN ) 5 MG tablet     2. Would you like to learn more about the convenience, safety, & potential cost savings by using the [facility]? No   3. Are you open to using the [facility] (Type [facility]. ) No   4. Which pharmacy/location (including street and city if local pharmacy) is medication to be sent to? [facility] [address], [place] - [address]    5. Do they need a 30 day or 90 day supply? 90 day  Pt no longer wants to use mail order pharmacy, please sent to above pharmacy

## 2023-10-22 NOTE — Telephone Encounter (Signed)
 Pt has been notified.

## 2023-10-22 NOTE — Addendum Note (Signed)
 Addended by: Quentin Brunner on: 10/22/2023 09:46 AM   Modules accepted: Orders

## 2023-10-22 NOTE — Telephone Encounter (Signed)
 Refill request for warfarin:  Last INR was 3.1 on 10/16/23 Next INR due 11/06/23 LOV was 10/22/23  Refill approved.

## 2023-10-28 ENCOUNTER — Other Ambulatory Visit: Payer: Self-pay | Admitting: Neurosurgery

## 2023-10-28 NOTE — Telephone Encounter (Signed)
 Patient is calling to follow up on clearance. Patient is requesting to a status update once the clearance has been approved. Please advise.

## 2023-10-28 NOTE — Telephone Encounter (Signed)
   Name: [name]  DOB: [birth year]  MRN: [id number]   Primary Cardiologist: Armida Lander, MD  Chart reviewed as part of pre-operative protocol coverage. RONNI DERKSEN was last seen on 10/22/2023 by Dr. Amanda Jungling.  Per Dr. Amanda Jungling "Ok from cardiac standpoint to proceed with surgery. He developed an extensive left leg DVT in 2022 when coumadin  was held for hernia surgery, for that reason would recommend bridging with lovenox. I messaged Claudean Crumbly to help manage this for him."  Aspirin prescribed by a noncardiology provider (neurology) therefore recommendations for holding deferred to prescribing provider.    Therefore, based on ACC/AHA guidelines, the patient would be an acceptable risk for the planned procedure without further cardiovascular testing.   I will route this recommendation to the requesting party via Epic fax function and remove from pre-op pool. Please call with questions.  Morey Ar, NP 10/28/2023, 5:01 PM

## 2023-10-28 NOTE — Telephone Encounter (Signed)
 I will forward to preop APP to review if the pt has been cleared, see notes

## 2023-10-29 ENCOUNTER — Ambulatory Visit: Attending: Cardiology | Admitting: *Deleted

## 2023-10-29 DIAGNOSIS — I4891 Unspecified atrial fibrillation: Secondary | ICD-10-CM | POA: Diagnosis not present

## 2023-10-29 DIAGNOSIS — Z5181 Encounter for therapeutic drug level monitoring: Secondary | ICD-10-CM

## 2023-10-29 LAB — POCT INR: INR: 3 (ref 2.0–3.0)

## 2023-10-29 NOTE — Patient Instructions (Signed)
 Continue warfarin 1 tablet daily except 1/2 tablet on Sundays, Tuesdays and Thursdays.  Pending back surgery on 11/20/23.  Will hold warfarin 5 days and Bridge with Lovenox. Recheck in 2 wks

## 2023-11-06 ENCOUNTER — Encounter

## 2023-11-06 ENCOUNTER — Encounter (HOSPITAL_COMMUNITY): Payer: Self-pay

## 2023-11-06 NOTE — Pre-Procedure Instructions (Signed)
 Surgical Instructions   Your procedure is scheduled on Wednesday, June 4th. Report to [facility] Main Entrance "A" at 06:30 A.M., then check in with the Admitting office. Any questions or running late day of surgery: call [phone]  Questions prior to your surgery date: call [phone], Monday-Friday, 8am-4pm. If you experience any cold or flu symptoms such as cough, fever, chills, shortness of breath, etc. between now and your scheduled surgery, please notify us  at the above number.     Remember:  Do not eat or drink after midnight the night before your surgery    Take these medicines the morning of surgery with A SIP OF WATER   budeson-glycopyrrolate-formoterol  (BREZTRI  AEROSPHERE)  metoprolol  tartrate (LOPRESSOR )   May take these medicines IF NEEDED: acetaminophen -codeine (TYLENOL  #2)  albuterol  (PROVENTIL ) nebulizer albuterol  (VENTOLIN  HFA)- bring inhaler with you on day of surgery ketorolac  (ACULAR ) eye drops omeprazole  (PRILOSEC)    FOLLOW THE INSTRUCTIONS GIVEN TO YOU BY THE COUMADIN  CLINIC FOR YOUR COUMADIN  TO LOVENOX BRIDGE.   Follow your surgeon's instructions on when to stop Aspirin.  If no instructions were given by your surgeon then you will need to call the office to get those instructions.    One week prior to surgery, STOP taking any Aleve, Naproxen, Ibuprofen, Motrin, Advil, Goody's, BC's, all herbal medications, fish oil, and non-prescription vitamins.                     Do NOT Smoke (Tobacco/Vaping) for 24 hours prior to your procedure.  If you use a CPAP at night, you may bring your mask/headgear for your overnight stay.   You will be asked to remove any contacts, glasses, piercing's, hearing aid's, dentures/partials prior to surgery. Please bring cases for these items if needed.    Patients discharged the day of surgery will not be allowed to drive home, and someone needs to stay with them for 24 hours.  SURGICAL WAITING ROOM VISITATION Patients  may have no more than 2 support people in the waiting area - these visitors may rotate.   Pre-op nurse will coordinate an appropriate time for 1 ADULT support person, who may not rotate, to accompany patient in pre-op.  Children under the age of 1 must have an adult with them who is not the patient and must remain in the main waiting area with an adult.  If the patient needs to stay at the hospital during part of their recovery, the visitor guidelines for inpatient rooms apply.  Please refer to the [facility] website for the visitor guidelines for any additional information.   If you received a COVID test during your pre-op visit  it is requested that you wear a mask when out in public, stay away from anyone that may not be feeling well and notify your surgeon if you develop symptoms. If you have been in contact with anyone that has tested positive in the last 10 days please notify you surgeon.      Pre-operative 5 CHG Bathing Instructions   You can play a key role in reducing the risk of infection after surgery. Your skin needs to be as free of germs as possible. You can reduce the number of germs on your skin by washing with CHG (chlorhexidine  gluconate) soap before surgery. CHG is an antiseptic soap that kills germs and continues to kill germs even after washing.   DO NOT use if you have an allergy to chlorhexidine /CHG or antibacterial soaps. If your skin becomes reddened  or irritated, stop using the CHG and notify one of our RNs at [phone].   Please shower with the CHG soap starting 4 days before surgery using the following schedule:     Please keep in mind the following:  DO NOT shave, including legs and underarms, starting the day of your first shower.   You may shave your face at any point before/day of surgery.  Place clean sheets on your bed the day you start using CHG soap. Use a clean washcloth (not used since being washed) for each shower. DO NOT sleep with pets once  you start using the CHG.   CHG Shower Instructions:  Wash your face and private area with normal soap. If you choose to wash your hair, wash first with your normal shampoo.  After you use shampoo/soap, rinse your hair and body thoroughly to remove shampoo/soap residue.  Turn the water  OFF and apply about 3 tablespoons (45 ml) of CHG soap to a CLEAN washcloth.  Apply CHG soap ONLY FROM YOUR NECK DOWN TO YOUR TOES (washing for 3-5 minutes)  DO NOT use CHG soap on face, private areas, open wounds, or sores.  Pay special attention to the area where your surgery is being performed.  If you are having back surgery, having someone wash your back for you may be helpful. Wait 2 minutes after CHG soap is applied, then you may rinse off the CHG soap.  Pat dry with a clean towel  Put on clean clothes/pajamas   If you choose to wear lotion, please use ONLY the CHG-compatible lotions that are listed below.  Additional instructions for the day of surgery: DO NOT APPLY any lotions, deodorants, cologne, or perfumes.   Do not bring valuables to the hospital. [facility] is not responsible for any belongings/valuables. Do not wear nail polish, gel polish, artificial nails, or any other type of covering on natural nails (fingers and toes) Do not wear jewelry or makeup Put on clean/comfortable clothes.  Please brush your teeth.  Ask your nurse before applying any prescription medications to the skin.     CHG Compatible Lotions   Aveeno Moisturizing lotion  Cetaphil Moisturizing Cream  Cetaphil Moisturizing Lotion  Clairol Herbal Essence Moisturizing Lotion, Dry Skin  Clairol Herbal Essence Moisturizing Lotion, Extra Dry Skin  Clairol Herbal Essence Moisturizing Lotion, Normal Skin  Curel Age Defying Therapeutic Moisturizing Lotion with Alpha Hydroxy  Curel Extreme Care Body Lotion  Curel Soothing Hands Moisturizing Hand Lotion  Curel Therapeutic Moisturizing Cream, Fragrance-Free  Curel Therapeutic  Moisturizing Lotion, Fragrance-Free  Curel Therapeutic Moisturizing Lotion, Original Formula  Eucerin Daily Replenishing Lotion  Eucerin Dry Skin Therapy Plus Alpha Hydroxy Crme  Eucerin Dry Skin Therapy Plus Alpha Hydroxy Lotion  Eucerin Original Crme  Eucerin Original Lotion  Eucerin Plus Crme Eucerin Plus Lotion  Eucerin TriLipid Replenishing Lotion  Keri Anti-Bacterial Hand Lotion  Keri Deep Conditioning Original Lotion Dry Skin Formula Softly Scented  Keri Deep Conditioning Original Lotion, Fragrance Free Sensitive Skin Formula  Keri Lotion Fast Absorbing Fragrance Free Sensitive Skin Formula  Keri Lotion Fast Absorbing Softly Scented Dry Skin Formula  Keri Original Lotion  Keri Skin Renewal Lotion Keri Silky Smooth Lotion  Keri Silky Smooth Sensitive Skin Lotion  Nivea Body Creamy Conditioning Oil  Nivea Body Extra Enriched Teacher, adult education Moisturizing Lotion Nivea Crme  Nivea Skin Firming Lotion  NutraDerm 30 Skin Lotion  NutraDerm Skin Lotion  NutraDerm Therapeutic Skin Cream  NutraDerm Therapeutic Skin Lotion  ProShield Protective Hand Cream  Provon moisturizing lotion  Please read over the following fact sheets that you were given.

## 2023-11-06 NOTE — Progress Notes (Signed)
 Patient requested for Daughter Lonell Stamos to be present in room during PAT appointment.  PCP - Evalyn Hillier, FNP Cardiologist - Dr Armida Lander (Clearance 10/28/23) Pulmonology - Dr Vernestine Gondola  CT Chest x-ray - 04/30/23 EKG - 10/22/23 Stress Test - Years ago, unknown location ECHO - 10/27/20 Cardiac Cath - 02/2008  ICD Pacemaker/Loop - n/a  Sleep Study -  n/a  Diabetes - n/a  Blood Thinner Instructions:  FOLLOW THE INSTRUCTIONS GIVEN TO YOU BY THE COUMADIN  CLINIC FOR YOUR COUMADIN  TO LOVENOX BRIDGE.   Aspirin Instructions: Follow your surgeon's instructions on when to stop aspirin prior to surgery,  If no instructions were given by your surgeon then you will need to call the office for those instructions.  Patient agrees to call MD for ASA instructions.  NPO   Anesthesia review: Yes  STOP now taking any Aspirin (unless otherwise instructed by your surgeon), Aleve, Naproxen, Ibuprofen, Motrin, Advil, Goody's, BC's, all herbal medications, fish oil, and all vitamins.   Coronavirus Screening Do you have any of the following symptoms:  Cough yes/no: No Fever (>100.38F)  yes/no: No Runny nose yes/no: No Sore throat yes/no: No Difficulty breathing/shortness of breath  yes/no: No  Have you traveled in the last 14 days and where? yes/no: No  Patient verbalized understanding of instructions that were given to them at the PAT appointment. Patient was also instructed that they will need to review over the PAT instructions again at home before surgery.

## 2023-11-06 NOTE — Pre-Procedure Instructions (Signed)
 Surgical Instructions   Your procedure is scheduled on Wednesday, June 4th. Report to [facility] Main Entrance "A" at 06:30 A.M., then check in with the Admitting office. Any questions or running late day of surgery: call [phone]  Questions prior to your surgery date: call [phone], Monday-Friday, 8am-4pm. If you experience any cold or flu symptoms such as cough, fever, chills, shortness of breath, etc. between now and your scheduled surgery, please notify us  at the above number.     Remember:  Do not eat or drink after midnight the night before your surgery    Take these medicines the morning of surgery with A SIP OF WATER   budeson-glycopyrrolate-formoterol  (BREZTRI  AEROSPHERE)  metoprolol  tartrate (LOPRESSOR )   May take these medicines IF NEEDED: acetaminophen -codeine (TYLENOL  #2)  albuterol  (PROVENTIL ) nebulizer albuterol  (VENTOLIN  HFA)- bring inhaler with you on day of surgery ketorolac  (ACULAR ) eye drops omeprazole  (PRILOSEC)    Follow your surgeon's instructions on when to stop warfarin (COUMADIN ).  If no instructions were given by your surgeon then you will need to call the office to get those instructions.    Follow your surgeon's instructions on when to stop Aspirin.  If no instructions were given by your surgeon then you will need to call the office to get those instructions.    One week prior to surgery, STOP taking any Aleve, Naproxen, Ibuprofen, Motrin, Advil, Goody's, BC's, all herbal medications, fish oil, and non-prescription vitamins.                     Do NOT Smoke (Tobacco/Vaping) for 24 hours prior to your procedure.  If you use a CPAP at night, you may bring your mask/headgear for your overnight stay.   You will be asked to remove any contacts, glasses, piercing's, hearing aid's, dentures/partials prior to surgery. Please bring cases for these items if needed.    Patients discharged the day of surgery will not be allowed to drive home, and  someone needs to stay with them for 24 hours.  SURGICAL WAITING ROOM VISITATION Patients may have no more than 2 support people in the waiting area - these visitors may rotate.   Pre-op nurse will coordinate an appropriate time for 1 ADULT support person, who may not rotate, to accompany patient in pre-op.  Children under the age of 75 must have an adult with them who is not the patient and must remain in the main waiting area with an adult.  If the patient needs to stay at the hospital during part of their recovery, the visitor guidelines for inpatient rooms apply.  Please refer to the [facility] website for the visitor guidelines for any additional information.   If you received a COVID test during your pre-op visit  it is requested that you wear a mask when out in public, stay away from anyone that may not be feeling well and notify your surgeon if you develop symptoms. If you have been in contact with anyone that has tested positive in the last 10 days please notify you surgeon.      Pre-operative 5 CHG Bathing Instructions   You can play a key role in reducing the risk of infection after surgery. Your skin needs to be as free of germs as possible. You can reduce the number of germs on your skin by washing with CHG (chlorhexidine  gluconate) soap before surgery. CHG is an antiseptic soap that kills germs and continues to kill germs even after washing.   DO NOT  use if you have an allergy to chlorhexidine /CHG or antibacterial soaps. If your skin becomes reddened or irritated, stop using the CHG and notify one of our RNs at [phone].   Please shower with the CHG soap starting 4 days before surgery using the following schedule:     Please keep in mind the following:  DO NOT shave, including legs and underarms, starting the day of your first shower.   You may shave your face at any point before/day of surgery.  Place clean sheets on your bed the day you start using CHG soap. Use a  clean washcloth (not used since being washed) for each shower. DO NOT sleep with pets once you start using the CHG.   CHG Shower Instructions:  Wash your face and private area with normal soap. If you choose to wash your hair, wash first with your normal shampoo.  After you use shampoo/soap, rinse your hair and body thoroughly to remove shampoo/soap residue.  Turn the water  OFF and apply about 3 tablespoons (45 ml) of CHG soap to a CLEAN washcloth.  Apply CHG soap ONLY FROM YOUR NECK DOWN TO YOUR TOES (washing for 3-5 minutes)  DO NOT use CHG soap on face, private areas, open wounds, or sores.  Pay special attention to the area where your surgery is being performed.  If you are having back surgery, having someone wash your back for you may be helpful. Wait 2 minutes after CHG soap is applied, then you may rinse off the CHG soap.  Pat dry with a clean towel  Put on clean clothes/pajamas   If you choose to wear lotion, please use ONLY the CHG-compatible lotions that are listed below.  Additional instructions for the day of surgery: DO NOT APPLY any lotions, deodorants, cologne, or perfumes.   Do not bring valuables to the hospital. [facility] is not responsible for any belongings/valuables. Do not wear nail polish, gel polish, artificial nails, or any other type of covering on natural nails (fingers and toes) Do not wear jewelry or makeup Put on clean/comfortable clothes.  Please brush your teeth.  Ask your nurse before applying any prescription medications to the skin.     CHG Compatible Lotions   Aveeno Moisturizing lotion  Cetaphil Moisturizing Cream  Cetaphil Moisturizing Lotion  Clairol Herbal Essence Moisturizing Lotion, Dry Skin  Clairol Herbal Essence Moisturizing Lotion, Extra Dry Skin  Clairol Herbal Essence Moisturizing Lotion, Normal Skin  Curel Age Defying Therapeutic Moisturizing Lotion with Alpha Hydroxy  Curel Extreme Care Body Lotion  Curel Soothing Hands  Moisturizing Hand Lotion  Curel Therapeutic Moisturizing Cream, Fragrance-Free  Curel Therapeutic Moisturizing Lotion, Fragrance-Free  Curel Therapeutic Moisturizing Lotion, Original Formula  Eucerin Daily Replenishing Lotion  Eucerin Dry Skin Therapy Plus Alpha Hydroxy Crme  Eucerin Dry Skin Therapy Plus Alpha Hydroxy Lotion  Eucerin Original Crme  Eucerin Original Lotion  Eucerin Plus Crme Eucerin Plus Lotion  Eucerin TriLipid Replenishing Lotion  Keri Anti-Bacterial Hand Lotion  Keri Deep Conditioning Original Lotion Dry Skin Formula Softly Scented  Keri Deep Conditioning Original Lotion, Fragrance Free Sensitive Skin Formula  Keri Lotion Fast Absorbing Fragrance Free Sensitive Skin Formula  Keri Lotion Fast Absorbing Softly Scented Dry Skin Formula  Keri Original Lotion  Keri Skin Renewal Lotion Keri Silky Smooth Lotion  Keri Silky Smooth Sensitive Skin Lotion  Nivea Body Creamy Conditioning Oil  Nivea Body Extra Enriched Teacher, adult education Moisturizing Lotion Nivea Crme  Nivea Skin Firming  Lotion  NutraDerm 30 Skin Lotion  NutraDerm Skin Lotion  NutraDerm Therapeutic Skin Cream  NutraDerm Therapeutic Skin Lotion  ProShield Protective Hand Cream  Provon moisturizing lotion  Please read over the following fact sheets that you were given.

## 2023-11-07 ENCOUNTER — Encounter (HOSPITAL_COMMUNITY)
Admission: RE | Admit: 2023-11-07 | Discharge: 2023-11-07 | Disposition: A | Discharge status: Home / Self Care | Source: Ambulatory Visit | Attending: Neurosurgery | Admitting: Neurosurgery

## 2023-11-07 ENCOUNTER — Encounter (HOSPITAL_COMMUNITY): Payer: Self-pay

## 2023-11-07 ENCOUNTER — Other Ambulatory Visit: Payer: Self-pay

## 2023-11-07 VITALS — BP 141/86 | HR 83 | Temp 98.4°F | Resp 18 | Ht 66.0 in | Wt 165.0 lb

## 2023-11-07 DIAGNOSIS — Z01812 Encounter for preprocedural laboratory examination: Secondary | ICD-10-CM | POA: Diagnosis present

## 2023-11-07 DIAGNOSIS — E785 Hyperlipidemia, unspecified: Secondary | ICD-10-CM | POA: Insufficient documentation

## 2023-11-07 DIAGNOSIS — Z8673 Personal history of transient ischemic attack (TIA), and cerebral infarction without residual deficits: Secondary | ICD-10-CM | POA: Diagnosis not present

## 2023-11-07 DIAGNOSIS — I2510 Atherosclerotic heart disease of native coronary artery without angina pectoris: Secondary | ICD-10-CM | POA: Insufficient documentation

## 2023-11-07 DIAGNOSIS — J449 Chronic obstructive pulmonary disease, unspecified: Secondary | ICD-10-CM | POA: Insufficient documentation

## 2023-11-07 DIAGNOSIS — K219 Gastro-esophageal reflux disease without esophagitis: Secondary | ICD-10-CM | POA: Diagnosis not present

## 2023-11-07 DIAGNOSIS — I482 Chronic atrial fibrillation, unspecified: Secondary | ICD-10-CM | POA: Insufficient documentation

## 2023-11-07 DIAGNOSIS — Z86718 Personal history of other venous thrombosis and embolism: Secondary | ICD-10-CM | POA: Insufficient documentation

## 2023-11-07 DIAGNOSIS — Z79899 Other long term (current) drug therapy: Secondary | ICD-10-CM | POA: Diagnosis not present

## 2023-11-07 DIAGNOSIS — Z7901 Long term (current) use of anticoagulants: Secondary | ICD-10-CM | POA: Insufficient documentation

## 2023-11-07 DIAGNOSIS — Z01818 Encounter for other preprocedural examination: Secondary | ICD-10-CM

## 2023-11-07 HISTORY — DX: Malignant (primary) neoplasm, unspecified: C80.1

## 2023-11-07 HISTORY — DX: Pneumonia, unspecified organism: J18.9

## 2023-11-07 HISTORY — DX: Dependence on other enabling machines and devices: Z99.89

## 2023-11-07 LAB — BASIC METABOLIC PANEL WITH GFR
Anion gap: 7 (ref 5–15)
BUN: 14 mg/dL (ref 8–23)
CO2: 30 mmol/L (ref 22–32)
Calcium: 9.7 mg/dL (ref 8.9–10.3)
Chloride: 104 mmol/L (ref 98–111)
Creatinine, Ser: 1.14 mg/dL (ref 0.61–1.24)
GFR, Estimated: >60 mL/min (ref >60)
Glucose, Bld: 127 mg/dL — ABNORMAL HIGH (ref 70–99)
Potassium: 4.3 mmol/L (ref 3.5–5.1)
Sodium: 141 mmol/L (ref 135–145)

## 2023-11-07 LAB — CBC
HCT: 47.7 % (ref 39.0–52.0)
Hemoglobin: 15.4 g/dL (ref 13.0–17.0)
MCH: 30 pg (ref 26.0–34.0)
MCHC: 32.3 g/dL (ref 30.0–36.0)
MCV: 92.8 fL (ref 80.0–100.0)
Platelets: 265 10*3/uL (ref 150–400)
RBC: 5.14 MIL/uL (ref 4.22–5.81)
RDW: 13.6 % (ref 11.5–15.5)
WBC: 7.5 10*3/uL (ref 4.0–10.5)
nRBC: 0 % (ref 0.0–0.2)

## 2023-11-07 LAB — SURGICAL PCR SCREEN
MRSA, PCR: NEGATIVE
Staphylococcus aureus: NEGATIVE

## 2023-11-08 ENCOUNTER — Telehealth: Payer: Self-pay | Admitting: Family Medicine

## 2023-11-08 ENCOUNTER — Ambulatory Visit (INDEPENDENT_AMBULATORY_CARE_PROVIDER_SITE_OTHER): Admitting: Family Medicine

## 2023-11-08 ENCOUNTER — Encounter: Payer: Self-pay | Admitting: Family Medicine

## 2023-11-08 ENCOUNTER — Ambulatory Visit

## 2023-11-08 VITALS — BP 138/88 | HR 72 | Temp 97.1°F | Ht 66.0 in | Wt 164.2 lb

## 2023-11-08 DIAGNOSIS — R7303 Prediabetes: Secondary | ICD-10-CM | POA: Diagnosis not present

## 2023-11-08 DIAGNOSIS — S32040G Wedge compression fracture of fourth lumbar vertebra, subsequent encounter for fracture with delayed healing: Secondary | ICD-10-CM | POA: Diagnosis not present

## 2023-11-08 DIAGNOSIS — N182 Chronic kidney disease, stage 2 (mild): Secondary | ICD-10-CM

## 2023-11-08 DIAGNOSIS — E559 Vitamin D deficiency, unspecified: Secondary | ICD-10-CM

## 2023-11-08 DIAGNOSIS — E782 Mixed hyperlipidemia: Secondary | ICD-10-CM | POA: Diagnosis not present

## 2023-11-08 DIAGNOSIS — E538 Deficiency of other specified B group vitamins: Secondary | ICD-10-CM

## 2023-11-08 DIAGNOSIS — J449 Chronic obstructive pulmonary disease, unspecified: Secondary | ICD-10-CM

## 2023-11-08 DIAGNOSIS — R351 Nocturia: Secondary | ICD-10-CM

## 2023-11-08 DIAGNOSIS — N401 Enlarged prostate with lower urinary tract symptoms: Secondary | ICD-10-CM

## 2023-11-08 NOTE — Telephone Encounter (Signed)
 Copied from CRM [id number]. Topic: Clinical - Prescription Issue >> Nov 08, 2023  1:25 PM Rosamond Comes wrote: Reason for CRM: patient daughter Sherrlyn Dolores calling stating patient medical marijuana card, prescription has expired, issued in 2023 asking for a new medical marijuana card.  Please call patient home phone [phone] ask for [facility]

## 2023-11-08 NOTE — Progress Notes (Signed)
 Subjective:  Patient ID: [name], male    DOB: [birth year], 83 y.o.   MRN: [id number]  Patient Care Team: Galvin Jules, FNP as PCP - General (Family Medicine) Amanda Jungling, Joyceann No, MD as PCP - Cardiology (Cardiology) Amanda Jungling Joyceann No, MD as Consulting Physician (Cardiology) Homero Luster, MD as Attending Physician (Urology) Delilah Fend, [facility] (Pharmacist)   Chief Complaint:  Medical Management of Chronic Issues (4 week follow up )   HPI: [name] is a 83 y.o. male presenting on 11/08/2023 for Medical Management of Chronic Issues (4 week follow up )   [name] is an 83 year old male who presents for pre-operative evaluation for upcoming back surgery.  He is scheduled for back surgery involving the placement of pins and cement for fusion. His back pain varies in intensity, sometimes severe and other times manageable, particularly when active. The pain is absent at rest but becomes bothersome with movement.  He has a history of hypertension and is currently taking metoprolol  twice daily. He does not monitor his blood pressure at home. No headaches, chest pain, or shortness of breath, but he reports some leg swelling.  He is on Coumadin  therapy, with a recent INR of 3.1, slightly above the therapeutic range.  He experiences changes in bowel habits, with bowel movements occurring one to two times a week, consistent with his lifelong pattern. He uses stool softeners every other day but feels they are not effective. He experiences bloating and has noticed weight gain since becoming less active after a fall.  He uses Breztri  twice daily for respiratory management and reports no recent changes in his breathing. He does not use his albuterol  inhaler.  He is also on Repatha , with no reported side effects.  He reports a decrease in urinary frequency, getting up once at night to urinate.        Relevant past medical, surgical, family, and social history reviewed and  updated as indicated.  Allergies and medications reviewed and updated. Data reviewed: Chart in Epic.   Past Medical History:  Diagnosis Date   Anxiety disorder    Benign prostatic hypertrophy    Bilateral cataracts    CAD (coronary artery disease)    Catheterization 2009, mild CAD   Cancer (HCC)    skin ca on arms   Carotid artery disease (HCC)    Doppler, September, 2011, less than 50% bilateral   COPD (chronic obstructive pulmonary disease) (HCC)    CVA (cerebral infarction)    Hospitalization.  MRI...    Left anterior cerebral artery territory, September, 2011... Coumadin  started   DYSLIPIDEMIA 11/01/2009   Qualifier: Diagnosis of  By: Ardell Koller, MD, [facility]    Ejection fraction    EF 60%   Ejection fraction    EF 55-60%, echo, September, 2011   Gastroesophageal reflux disease    History of deep venous thrombosis (DVT) of distal vein of left lower extremity 08/05/2020   History of left inguinal hernia    Hyperlipidemia    Persistent atrial fibrillation (HCC)        Pneumonia    x several   Ptosis of eyelid, left    Surgery in the past   Stroke (HCC) 02/2010   Sweating abnormality    Episodes of sweating appeared to be related to a delayed reaction to niacin   Vitamin D  insufficiency    Walker as ambulation aid     Past Surgical History:  Procedure Laterality Date  APPENDECTOMY     CATARACT EXTRACTION, BILATERAL     COLONOSCOPY     INGUINAL HERNIA REPAIR Left 07/02/2019   INGUINAL HERNIA REPAIR Left 07/01/2020   Procedure: LEFT INGUINAL HERNIA REPAIR WITH MESH;  Surgeon: Awilda Bogus, MD;  Location: [facility];  Service: General;  Laterality: Left;   KNEE SURGERY Right 2019   torn ligament?    TRANSURETHRAL RESECTION OF PROSTATE N/A 08/19/2018   Procedure: TRANSURETHRAL RESECTION OF THE PROSTATE (TURP) OF PROSTATE ABCESS;  Surgeon: Osborn Blaze, MD;  Location: [facility];  Service: Urology;  Laterality: N/A;    Social History   Socioeconomic History    Marital status: Married    Spouse name: Zaccary Creech   Number of children: 5   Years of education: 11   Highest education level: 11th grade  Occupational History   Occupation: Retired Biochemist, clinical   Tobacco Use   Smoking status: Former    Current packs/day: 0.00    Average packs/day: 3.0 packs/day for 50.0 years (150.0 ttl pk-yrs)    Types: Cigarettes    Start date: 03/18/1940    Quit date: 03/18/1990    Years since quitting: 33.6    Passive exposure: Past   Smokeless tobacco: Former    Types: Chew    Quit date: 06/18/2000  Vaping Use   Vaping status: Never Used  Substance and Sexual Activity   Alcohol use: No    Alcohol/week: 0.0 standard drinks of alcohol   Drug use: No   Sexual activity: Not Currently    Partners: Female  Other Topics Concern   Not on file  Social History Narrative   Quit smoking in 1993. No significant alcohol abuse. Retired from trucking.    Social Drivers of Corporate investment banker Strain: Low Risk  (05/07/2023)   Received from [facility]   Overall Financial Resource Strain (CARDIA)    Difficulty of Paying Living Expenses: Not hard at all  Food Insecurity: No Food Insecurity (05/07/2023)   [facility] from [facility]   Hunger Vital Sign    Worried About Running Out of Food in the Last Year: Never true    Ran Out of Food in the Last Year: Never true  Transportation Needs: No Transportation Needs (05/07/2023)   Received from [facility] - Transportation    Lack of Transportation (Medical): No    Lack of Transportation (Non-Medical): No  Physical Activity: Insufficiently Active (03/26/2023)   Exercise Vital Sign    Days of Exercise per Week: 3 days    Minutes of Exercise per Session: 30 min  Stress: No Stress Concern Present (03/26/2023)   [employer] of Occupational Health - Occupational Stress Questionnaire    Feeling of Stress : Not at all  Social Connections: Unknown (04/21/2023)   Received from [facility]    Social Network    Social Network: Not on file  Intimate Partner Violence: Unknown (04/21/2023)   Received from [facility]   HITS    Physically Hurt: Not on file    Insult or Talk Down To: Not on file    Threaten Physical Harm: Not on file    Scream or Curse: Not on file    Outpatient Encounter Medications as of 11/08/2023  Medication Sig   acetaminophen -codeine (TYLENOL  #2) 300-15 MG tablet Take 1 tablet by mouth every 6 (six) hours as needed for severe pain (pain score 7-10).   albuterol  (PROVENTIL ) (2.5 MG/3ML) 0.083% nebulizer solution USE 1 VIAL IN  NEBULIZER EVERY 6 HOURS AS NEEDED FOR WHEEZING FOR SHORTNESS OF BREATH   albuterol  (VENTOLIN  HFA) 108 (90 Base) MCG/ACT inhaler Inhale 2 puffs into the lungs every 6 (six) hours as needed for wheezing or shortness of breath.   aspirin EC 81 MG tablet Take 81 mg by mouth at bedtime. Swallow whole.   budeson-glycopyrrolate-formoterol  (BREZTRI  AEROSPHERE) 160-9-4.8 MCG/ACT AERO inhaler Inhale 2 puffs into the lungs in the morning and at bedtime.   cyanocobalamin  (VITAMIN B12) 1000 MCG/ML injection Inject 1,000 mcg as directed every 30 (thirty) days.   guaiFENesin (MUCINEX) 600 MG 12 hr tablet Take 600 mg by mouth daily.   ketorolac  (ACULAR ) 0.5 % ophthalmic solution PLACE 1 DROP INTO THE LEFT EYE 4 TIMES DAILY. (Patient taking differently: Place 1 drop into the left eye daily as needed (seasonal allergy).)   metoprolol  tartrate (LOPRESSOR ) 25 MG tablet Take 1 tablet (25 mg total) by mouth 2 (two) times daily.   omeprazole  (PRILOSEC) 20 MG capsule Take 20 mg by mouth daily as needed (heartburn).   REPATHA  SURECLICK 140 MG/ML SOAJ INJECT 140 MG SUBCUTANEOUSLY ONCE EVERY 14 DAYS   warfarin (COUMADIN ) 5 MG tablet TAKE 1/2 TO 1 TABLET BY MOUTH ONCE DAILY AS DIRECTED BY COUMADIN  CLINIC (Patient taking differently: Take 2.5-5 mg by mouth See admin instructions. TAKE 1/2 TO 1 TABLET BY MOUTH ONCE DAILY AS DIRECTED BY COUMADIN  CLINIC; alternating 2.5 mg  and 5 mg every other day)   Facility-Administered Encounter Medications as of 11/08/2023  Medication   cyanocobalamin  (VITAMIN B12) injection 1,000 mcg    Allergies  Allergen Reactions   Asa [Aspirin]     GI upset   Niacin     hot flashes   Pravastatin      myalgias   Rosuvastatin      myalgias   Simvastatin      Myalgias    Tramadol  Other (See Comments)    hallucinations    Pertinent ROS per HPI, otherwise unremarkable      Objective:  BP 138/88   Pulse 72   Temp (!) 97.1 F (36.2 C)   Ht 5\' 6"  (1.676 m)   Wt 164 lb 3.2 oz (74.5 kg)   SpO2 95%   BMI 26.50 kg/m    Wt Readings from Last 3 Encounters:  11/08/23 164 lb 3.2 oz (74.5 kg)  11/07/23 165 lb (74.8 kg)  10/22/23 165 lb (74.8 kg)    Physical Exam Vitals and nursing note reviewed.  Constitutional:      Appearance: He is ill-appearing (chronically ill).  HENT:     Head: Normocephalic and atraumatic.     Nose: Nose normal.     Mouth/Throat:     Mouth: Mucous membranes are moist.     Pharynx: Oropharynx is clear.  Eyes:     Conjunctiva/sclera: Conjunctivae normal.     Pupils: Pupils are equal, round, and reactive to light.  Cardiovascular:     Rate and Rhythm: Normal rate. Rhythm irregularly irregular.     Heart sounds: Normal heart sounds.  Pulmonary:     Effort: Pulmonary effort is normal.     Breath sounds: Decreased breath sounds present.  Musculoskeletal:     Right lower leg: No edema.     Left lower leg: No edema.     Comments: Brace on right wrist  Skin:    General: Skin is warm and dry.     Capillary Refill: Capillary refill takes less than 2 seconds.  Neurological:     General:  No focal deficit present.     Mental Status: He is alert and oriented to person, place, and time.     Gait: Gait abnormal (in wheelchair).  Psychiatric:        Mood and Affect: Mood normal.        Behavior: Behavior normal.        Thought Content: Thought content normal.        Judgment: Judgment normal.      Results for orders placed or performed during the hospital encounter of 11/07/23  Surgical pcr screen   Collection Time: 11/07/23 11:46 AM   Specimen: Nasal Mucosa; Nasal Swab  Result Value Ref Range   MRSA, PCR NEGATIVE NEGATIVE   Staphylococcus aureus NEGATIVE NEGATIVE  CBC   Collection Time: 11/07/23 11:50 AM  Result Value Ref Range   WBC 7.5 4.0 - 10.5 K/uL   RBC 5.14 4.22 - 5.81 MIL/uL   Hemoglobin 15.4 13.0 - 17.0 g/dL   HCT 27.2 53.6 - 64.4 %   MCV 92.8 80.0 - 100.0 fL   MCH 30.0 26.0 - 34.0 pg   MCHC 32.3 30.0 - 36.0 g/dL   RDW 03.4 74.2 - 59.5 %   Platelets 265 150 - 400 K/uL   nRBC 0.0 0.0 - 0.2 %  Basic metabolic panel   Collection Time: 11/07/23 11:50 AM  Result Value Ref Range   Sodium 141 135 - 145 mmol/L   Potassium 4.3 3.5 - 5.1 mmol/L   Chloride 104 98 - 111 mmol/L   CO2 30 22 - 32 mmol/L   Glucose, Bld 127 (H) 70 - 99 mg/dL   BUN 14 8 - 23 mg/dL   Creatinine, Ser 6.38 0.61 - 1.24 mg/dL   Calcium  9.7 8.9 - 10.3 mg/dL   GFR, Estimated >75 >64 mL/min   Anion gap 7 5 - 15       Pertinent labs & imaging results that were available during my care of the patient were reviewed by me and considered in my medical decision making.  Assessment & Plan:  Lee was seen today for medical management of chronic issues.  Diagnoses and all orders for this visit:  Mixed hyperlipidemia -     Lipid panel  Vitamin D  insufficiency -     Anemia Profile B  Compression fracture of L4 vertebra with delayed healing, subsequent encounter -     Anemia Profile B  Prediabetes -     Lipid panel  Chronic kidney disease (CKD), stage II (mild) -     Anemia Profile B  Benign prostatic hyperplasia with nocturia -     PSA, total and free  Vitamin B 12 deficiency -     Anemia Profile B  COPD GOLD 2 with reversibility  -     Anemia Profile B     Assessment and Plan    Back pain with planned surgery Chronic back pain with scheduled spinal fusion, hardware, and  cement placement. Anticipated overnight hospital stay due to age and medical history. - Proceed with planned spinal fusion surgery with hardware and cement placement.  Hypertension Hypertension with occasional elevated readings. Blood pressure well-controlled during recent pre-operative assessment. Current regimen includes metoprolol  twice daily. Reports occasional leg swelling but no headaches, chest pain, or dyspnea. - Provide a blood pressure monitoring chart for home use. - Instruct to monitor blood pressure at home regularly. - Reassess blood pressure management based on home readings.  Chronic obstructive pulmonary disease (COPD) COPD with no recent  exacerbations. Uses Breztri  twice daily and reports no need for albuterol  rescue inhaler. No recent changes in respiratory status. - Continue Breztri  twice daily with mouth rinsing after use.  Prediabetes Prediabetes with recent HbA1c of 5.9%. No symptoms or complications reported. - Order HbA1c test to monitor glucose control.  Chronic constipation Chronic constipation with bowel movements 1-2 times per week. Currently using stool softeners every other day with limited effect. - Instruct to take stool softeners daily to improve bowel regularity.        Continue all other maintenance medications.  Follow up plan: Return in about 3 months (around 02/08/2024), or if symptoms worsen or fail to improve, for chronic follow up.   Continue healthy lifestyle choices, including diet (rich in fruits, vegetables, and lean proteins, and low in salt and simple carbohydrates) and exercise (at least 30 minutes of moderate physical activity daily).  The above assessment and management plan was discussed with the patient. The patient verbalized understanding of and has agreed to the management plan. Patient is aware to call the clinic if they develop any new symptoms or if symptoms persist or worsen. Patient is aware when to return to the clinic for a  follow-up visit. Patient educated on when it is appropriate to go to the emergency department.   Kattie Parrot, FNP-C [facility] [phone]

## 2023-11-08 NOTE — Telephone Encounter (Signed)
 Patient aware we do not do medical cards.

## 2023-11-09 LAB — ANEMIA PROFILE B
Basophils Absolute: 0 10*3/uL (ref 0.0–0.2)
Basos: 0 %
EOS (ABSOLUTE): 0 10*3/uL (ref 0.0–0.4)
Eos: 0 %
Ferritin: 261 ng/mL (ref 30–400)
Folate: 9.7 ng/mL (ref >3.0)
Hematocrit: 45.7 % (ref 37.5–51.0)
Hemoglobin: 14.8 g/dL (ref 13.0–17.7)
Immature Grans (Abs): 0 10*3/uL (ref 0.0–0.1)
Immature Granulocytes: 0 %
Iron Saturation: 44 % (ref 15–55)
Iron: 130 ug/dL (ref 38–169)
Lymphocytes Absolute: 1.7 10*3/uL (ref 0.7–3.1)
Lymphs: 21 %
MCH: 30.3 pg (ref 26.6–33.0)
MCHC: 32.4 g/dL (ref 31.5–35.7)
MCV: 94 fL (ref 79–97)
Monocytes Absolute: 0.5 10*3/uL (ref 0.1–0.9)
Monocytes: 6 %
Neutrophils Absolute: 5.6 10*3/uL (ref 1.4–7.0)
Neutrophils: 73 %
Platelets: 235 10*3/uL (ref 150–450)
RBC: 4.88 x10E6/uL (ref 4.14–5.80)
RDW: 13.4 % (ref 11.6–15.4)
Retic Ct Pct: 1 % (ref 0.6–2.6)
Total Iron Binding Capacity: 296 ug/dL (ref 250–450)
UIBC: 166 ug/dL (ref 111–343)
Vitamin B-12: >2000 pg/mL — ABNORMAL HIGH (ref 232–1245)
WBC: 7.8 10*3/uL (ref 3.4–10.8)

## 2023-11-09 LAB — LIPID PANEL
Chol/HDL Ratio: 4.4 ratio (ref 0.0–5.0)
Cholesterol, Total: 150 mg/dL (ref 100–199)
HDL: 34 mg/dL — ABNORMAL LOW (ref >39)
LDL Chol Calc (NIH): 87 mg/dL (ref 0–99)
Triglycerides: 164 mg/dL — ABNORMAL HIGH (ref 0–149)
VLDL Cholesterol Cal: 29 mg/dL (ref 5–40)

## 2023-11-09 LAB — PSA, TOTAL AND FREE
PSA, Free Pct: 35.5 %
PSA, Free: 0.39 ng/mL
Prostate Specific Ag, Serum: 1.1 ng/mL (ref 0.0–4.0)

## 2023-11-11 ENCOUNTER — Ambulatory Visit: Payer: Self-pay | Admitting: Family Medicine

## 2023-11-12 ENCOUNTER — Telehealth: Payer: Self-pay | Admitting: Cardiology

## 2023-11-12 DIAGNOSIS — I4891 Unspecified atrial fibrillation: Secondary | ICD-10-CM

## 2023-11-12 MED ORDER — WARFARIN SODIUM 5 MG PO TABS
ORAL_TABLET | ORAL | 1 refills | Status: DC
Start: 2023-11-12 — End: 2023-12-15

## 2023-11-12 NOTE — Telephone Encounter (Signed)
*  STAT* If patient is at the pharmacy, call can be transferred to refill team.   1. Which medications need to be refilled? (please list name of each medication and dose if known) warfarin (COUMADIN ) 5 MG tablet   2. Which pharmacy/location (including street and city if local pharmacy) is medication to be sent to? [facility] [address], [place] - [address]   3. Do they need a 30 day or 90 day supply?   90 day supply  Patient is completely out of medication.

## 2023-11-12 NOTE — Telephone Encounter (Signed)
 Refill request for warfarin:  Last INR was 10/29/23 Next INR due 11/13/23 LOV was 10/22/23  Refill approved.

## 2023-11-12 NOTE — Anesthesia Preprocedure Evaluation (Addendum)
 Anesthesia Evaluation  Patient identified by MRN, date of birth, ID band Patient awake    Reviewed: Allergy & Precautions, NPO status , Patient's Chart, lab work & pertinent test results  Airway Mallampati: II  TM Distance: >3 FB Neck ROM: Full    Dental no notable dental hx. (+) Edentulous Upper, Edentulous Lower   Pulmonary COPD,  COPD inhaler, former smoker   Pulmonary exam normal        Cardiovascular hypertension, Pt. on medications and Pt. on home beta blockers + CAD and + DVT  + dysrhythmias Atrial Fibrillation  Rhythm:Regular Rate:Normal     Neuro/Psych   Anxiety     CVA    GI/Hepatic Neg liver ROS,GERD  Medicated,,  Endo/Other  negative endocrine ROS    Renal/GU   negative genitourinary   Musculoskeletal Lumbar compression fx   Abdominal Normal abdominal exam  (+)   Peds  Hematology Lab Results      Component                Value               Date                      WBC                      7.8                 11/08/2023                HGB                      14.8                11/08/2023                HCT                      45.7                11/08/2023                MCV                      94                  11/08/2023                PLT                      235                 11/08/2023             Lab Results      Component                Value               Date                      NA                       141                 11/07/2023  K                        4.3                 11/07/2023                CO2                      30                  11/07/2023                GLUCOSE                  127 (H)             11/07/2023                BUN                      14                  11/07/2023                CREATININE               1.14                11/07/2023                CALCIUM                   9.7                 11/07/2023                EGFR                      85                  10/03/2023                GFRNONAA                 >60                 11/07/2023              Anesthesia Other Findings   Reproductive/Obstetrics                             Anesthesia Physical Anesthesia Plan  ASA: 3  Anesthesia Plan: General   Post-op Pain Management: Tylenol  PO (pre-op)*   Induction: Intravenous  PONV Risk Score and Plan: 2 and Ondansetron , Dexamethasone  and Treatment may vary due to age or medical condition  Airway Management Planned: Mask and Oral ETT  Additional Equipment: None  Intra-op Plan:   Post-operative Plan: Extubation in OR  Informed Consent: I have reviewed the patients History and Physical, chart, labs and discussed the procedure including the risks, benefits and alternatives for the proposed anesthesia with the patient or authorized representative who has indicated his/her understanding and acceptance.     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 Last seen by Dr. Amanda Jungling 10/22/23 for preop eval. Per note, "- prior to recent back injury was tolerating greater than 4 METs without any significant symptoms - in 2022 when coumadin  was held for inguinal surgery developed extensive left sided DVT. For this reason would recommend lovenox  bridging for upcoming surgery. - ASA was added by neurology to his coumadin  for TIA like symptoms, not from cardiology. In general though would be ok to hold for procedure - recommend proceeding with surgery with lovenox  bridging as listed above."  Lovenox  bridge per coumadin  clinic.  Other pertinent hx includes GERD on PPI, COPD on Breztri  and prn Albuterol .   Preop labs reviewed, unremarkable.  EKG 10/22/23:  Atrial fibrillation. Rate 66. Septal infarct , age undetermined  PFTs 09/18/21: FVC-%Pred-Pre % 65 FEV1-%Pred-Pre % 51 FEV1FVC-%Pred-Pre % 77 TLC % pred % 107 RV % pred % 178 DLCO unc % pred % 68  CTA head/neck 04/20/23(care everywhere): 1. Negative CTA for large vessel occlusion or other emergent  finding.  2. Atheromatous change about the carotid bifurcations with  associated stenosis of up to 70% on the left.  3. 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  4. Intracranial atherosclerotic disease, most notably about the PCAs  with there are moderate multifocal distal P2/P3 stenoses.  5. Aortic Atherosclerosis (ICD10-I70.0).   TTE 04/22/23 (care everywhere); LeftVentricle: Systolic function is low normal. EF: 55-60%. Ejection  fraction measured by 3D is 58%, which is normal.   TricuspidValve: The right ventricular systolic pressure is normal (<36  mmHg).  AorticValve: The aortic valve is tricuspid.The leaflets are  moderately thickened. There is moderate sclerosis.   AorticValve: Mild to moderate aortic valve regurgitation.   MitralValve: There is mild regurgitation.   LeftAtrium: Injection of agitated saline documents no interatrial  shunt.   )        Anesthesia Quick Evaluation

## 2023-11-12 NOTE — Progress Notes (Signed)
 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 Last seen by Dr. Amanda Jungling 10/22/23 for preop eval. Per note, "- prior to recent back injury was tolerating greater than 4 METs without any significant symptoms - in 2022 when coumadin  was held for inguinal surgery developed extensive left sided DVT. For this reason would recommend lovenox bridging for upcoming surgery. - ASA was added by neurology to his coumadin  for TIA like symptoms, not from cardiology. In general though would be ok to hold for procedure - recommend proceeding with surgery with lovenox bridging as listed above."  Lovenox bridge per coumadin  clinic.  Other pertinent hx includes GERD on PPI, COPD on Breztri  and prn Albuterol .   Preop labs reviewed, unremarkable.  EKG 10/22/23: Atrial fibrillation. Rate 66. Septal infarct , age undetermined  PFTs 09/18/21: FVC-%Pred-Pre % 65  FEV1-%Pred-Pre % 51  FEV1FVC-%Pred-Pre % 77  TLC % pred % 107  RV % pred % 178  DLCO unc % pred % 68   CTA head/neck 04/20/23(care everywhere): 1. Negative CTA for large vessel occlusion or other emergent  finding.  2. Atheromatous change about the carotid bifurcations with  associated stenosis of up to 70% on the left.  3. 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  4. Intracranial atherosclerotic disease, most notably about the PCAs  with there are moderate multifocal distal P2/P3 stenoses.  5.  Aortic Atherosclerosis (ICD10-I70.0).   TTE 04/22/23 (care everywhere); Left Ventricle: Systolic function is low normal. EF: 55-60%. Ejection  fraction measured by 3D is 58%, which is normal.    Tricuspid Valve: The right ventricular systolic pressure is normal (<36  mmHg).   Aortic Valve: The aortic valve is tricuspid. The leaflets are  moderately thickened. There is moderate  sclerosis.    Aortic Valve: Mild to moderate aortic valve regurgitation.    Mitral Valve: There is mild regurgitation.    Left Atrium: Injection of agitated saline documents no interatrial  shunt.    Edilia Gordon [facility] Stay [facility]/Anesthesiology Phone [phone] 11/12/2023 2:51 PM

## 2023-11-13 ENCOUNTER — Other Ambulatory Visit: Payer: Self-pay | Admitting: Pharmacist

## 2023-11-13 ENCOUNTER — Ambulatory Visit

## 2023-11-13 DIAGNOSIS — E782 Mixed hyperlipidemia: Secondary | ICD-10-CM

## 2023-11-13 MED ORDER — REPATHA SURECLICK 140 MG/ML ~~LOC~~ SOAJ
140.0000 mg | SUBCUTANEOUS | 3 refills | Status: DC
Start: 2023-11-13 — End: 2023-12-15

## 2023-11-14 ENCOUNTER — Ambulatory Visit: Attending: Cardiology | Admitting: *Deleted

## 2023-11-14 ENCOUNTER — Telehealth: Payer: Self-pay | Admitting: Cardiology

## 2023-11-14 DIAGNOSIS — I4891 Unspecified atrial fibrillation: Secondary | ICD-10-CM

## 2023-11-14 DIAGNOSIS — Z5181 Encounter for therapeutic drug level monitoring: Secondary | ICD-10-CM | POA: Diagnosis not present

## 2023-11-14 LAB — POCT INR: INR: 1.9 — AB (ref 2.0–3.0)

## 2023-11-14 MED ORDER — ENOXAPARIN SODIUM 120 MG/0.8ML IJ SOSY: 120.0000 mg | PREFILLED_SYRINGE | INTRAMUSCULAR | 0 refills | Status: DC

## 2023-11-14 NOTE — Telephone Encounter (Signed)
 Daughter wants to get further instructions regarding patient's Lovenox bridge.

## 2023-11-14 NOTE — Patient Instructions (Signed)
 11/20/23  Back surgery  Labs:  11/07/23:  Scr 1.14  CrCl 49.92  Hgb 15.4  Hct 47.7  Plts 265  Wt 74.5kg  Lovenox 120mg  SQ once daily  # 10 sent to [facility]  5/29  Take last dose of warfarin 5/30  No Lovenox or warfarin 5/31 - 6/2  Lovenox 120mg  SQ @ 7pm. 6/3  No Lovenox 6/4  No lovenox -----surgery------warfarin 1.5 tablets in PM 6/5 - 6/7  Lovenox 120mg  sq @ 7am and warfarin 1 tablet PM 6/8   Lovenox 120mg  sq @ 7am and warfarin 0.5 tablet PM 6/9   Lovenox 120mg  sq @ 7am and warfarin 1 tablet PM 6/10   Lovenox 120mg  sq @ 7am and INR appt at 9:45am

## 2023-11-14 NOTE — Telephone Encounter (Signed)
 Spoke with daughter Sherrlyn Dolores.  Went over Lovenox  bridging instructions that I discussed with pt/son this morning.  All questions were answered and she verbalized understanding.

## 2023-11-20 ENCOUNTER — Inpatient Hospital Stay (HOSPITAL_COMMUNITY)

## 2023-11-20 ENCOUNTER — Inpatient Hospital Stay (HOSPITAL_COMMUNITY)
Admission: RE | Admit: 2023-11-20 | Discharge: 2023-11-21 | DRG: 520 | Disposition: A | Discharge status: Home Health | Attending: Neurosurgery | Admitting: Neurosurgery

## 2023-11-20 ENCOUNTER — Inpatient Hospital Stay (HOSPITAL_COMMUNITY): Payer: Self-pay | Admitting: Physician Assistant

## 2023-11-20 ENCOUNTER — Inpatient Hospital Stay (HOSPITAL_COMMUNITY): Admitting: Anesthesiology

## 2023-11-20 ENCOUNTER — Other Ambulatory Visit: Payer: Self-pay

## 2023-11-20 ENCOUNTER — Inpatient Hospital Stay (HOSPITAL_COMMUNITY)
Admission: RE | Disposition: A | Discharge status: Home Health | Payer: Self-pay | Source: Home / Self Care | Attending: Neurosurgery

## 2023-11-20 DIAGNOSIS — F419 Anxiety disorder, unspecified: Secondary | ICD-10-CM | POA: Diagnosis present

## 2023-11-20 DIAGNOSIS — K219 Gastro-esophageal reflux disease without esophagitis: Secondary | ICD-10-CM | POA: Diagnosis present

## 2023-11-20 DIAGNOSIS — Z86718 Personal history of other venous thrombosis and embolism: Secondary | ICD-10-CM

## 2023-11-20 DIAGNOSIS — Z886 Allergy status to analgesic agent status: Secondary | ICD-10-CM

## 2023-11-20 DIAGNOSIS — Z79899 Other long term (current) drug therapy: Secondary | ICD-10-CM

## 2023-11-20 DIAGNOSIS — Z8673 Personal history of transient ischemic attack (TIA), and cerebral infarction without residual deficits: Secondary | ICD-10-CM

## 2023-11-20 DIAGNOSIS — J449 Chronic obstructive pulmonary disease, unspecified: Secondary | ICD-10-CM | POA: Diagnosis present

## 2023-11-20 DIAGNOSIS — S32049A Unspecified fracture of fourth lumbar vertebra, initial encounter for closed fracture: Principal | ICD-10-CM | POA: Diagnosis present

## 2023-11-20 DIAGNOSIS — R2689 Other abnormalities of gait and mobility: Secondary | ICD-10-CM | POA: Diagnosis present

## 2023-11-20 DIAGNOSIS — I2510 Atherosclerotic heart disease of native coronary artery without angina pectoris: Secondary | ICD-10-CM | POA: Diagnosis present

## 2023-11-20 DIAGNOSIS — S32040A Wedge compression fracture of fourth lumbar vertebra, initial encounter for closed fracture: Secondary | ICD-10-CM | POA: Diagnosis present

## 2023-11-20 DIAGNOSIS — W19XXXA Unspecified fall, initial encounter: Secondary | ICD-10-CM | POA: Diagnosis present

## 2023-11-20 DIAGNOSIS — Z7901 Long term (current) use of anticoagulants: Secondary | ICD-10-CM | POA: Diagnosis not present

## 2023-11-20 DIAGNOSIS — Z885 Allergy status to narcotic agent status: Secondary | ICD-10-CM

## 2023-11-20 DIAGNOSIS — I4891 Unspecified atrial fibrillation: Secondary | ICD-10-CM | POA: Diagnosis present

## 2023-11-20 DIAGNOSIS — R296 Repeated falls: Secondary | ICD-10-CM | POA: Diagnosis present

## 2023-11-20 DIAGNOSIS — I10 Essential (primary) hypertension: Secondary | ICD-10-CM | POA: Diagnosis present

## 2023-11-20 DIAGNOSIS — S32000A Wedge compression fracture of unspecified lumbar vertebra, initial encounter for closed fracture: Principal | ICD-10-CM | POA: Diagnosis present

## 2023-11-20 DIAGNOSIS — Z87891 Personal history of nicotine dependence: Secondary | ICD-10-CM

## 2023-11-20 DIAGNOSIS — Z888 Allergy status to other drugs, medicaments and biological substances status: Secondary | ICD-10-CM | POA: Diagnosis not present

## 2023-11-20 LAB — CBC
HCT: 45.1 % (ref 39.0–52.0)
Hemoglobin: 14.1 g/dL (ref 13.0–17.0)
MCH: 30 pg (ref 26.0–34.0)
MCHC: 31.3 g/dL (ref 30.0–36.0)
MCV: 96 fL (ref 80.0–100.0)
Platelets: 233 10*3/uL (ref 150–400)
RBC: 4.7 MIL/uL (ref 4.22–5.81)
RDW: 12.8 % (ref 11.5–15.5)
WBC: 13.4 10*3/uL — ABNORMAL HIGH (ref 4.0–10.5)
nRBC: 0 % (ref 0.0–0.2)

## 2023-11-20 LAB — CREATININE, SERUM
Creatinine, Ser: 1.1 mg/dL (ref 0.61–1.24)
GFR, Estimated: >60 mL/min (ref >60)

## 2023-11-20 SURGERY — LAMINECTOMY WITH POSTERIOR LATERAL ARTHRODESIS LEVEL 1
Anesthesia: General

## 2023-11-20 MED ORDER — METOPROLOL TARTRATE 25 MG PO TABS
25.0000 mg | ORAL_TABLET | Freq: Once | ORAL | Status: AC
  Administered 2023-11-20: 25 mg via ORAL
  Filled 2023-11-20: qty 2

## 2023-11-20 MED ORDER — ENOXAPARIN SODIUM 120 MG/0.8ML IJ SOSY: 120.0000 mg | PREFILLED_SYRINGE | INTRAMUSCULAR | Status: DC

## 2023-11-20 MED ORDER — ONDANSETRON HCL 4 MG/2ML IJ SOLN
INTRAMUSCULAR | Status: AC
  Filled 2023-11-20: qty 2

## 2023-11-20 MED ORDER — GUAIFENESIN ER 600 MG PO TB12
600.0000 mg | ORAL_TABLET | Freq: Every day | ORAL | Status: DC
  Administered 2023-11-20 – 2023-11-21 (×2): 600 mg via ORAL
  Filled 2023-11-20 (×4): qty 1

## 2023-11-20 MED ORDER — HYDROCODONE-ACETAMINOPHEN 5-325 MG PO TABS
1.0000 | ORAL_TABLET | ORAL | Status: DC | PRN
  Administered 2023-11-20 – 2023-11-21 (×2): 1 via ORAL
  Filled 2023-11-20 (×2): qty 1

## 2023-11-20 MED ORDER — BUPIVACAINE HCL (PF) 0.5 % IJ SOLN
INTRAMUSCULAR | Status: DC | PRN
  Administered 2023-11-20: 20 mL

## 2023-11-20 MED ORDER — KETOROLAC TROMETHAMINE 0.5 % OP SOLN: 1.0000 [drp] | Freq: Every day | OPHTHALMIC | Status: DC | PRN

## 2023-11-20 MED ORDER — LIDOCAINE 2% (20 MG/ML) 5 ML SYRINGE
INTRAMUSCULAR | Status: AC
Start: 2023-11-20 — End: ?
  Filled 2023-11-20: qty 5

## 2023-11-20 MED ORDER — SUGAMMADEX SODIUM 200 MG/2ML IV SOLN
INTRAVENOUS | Status: DC | PRN
  Administered 2023-11-20: 200 mg via INTRAVENOUS

## 2023-11-20 MED ORDER — THROMBIN (RECOMBINANT) 5000 UNITS EX SOLR
CUTANEOUS | Status: DC | PRN
  Administered 2023-11-20: 10 mL via TOPICAL

## 2023-11-20 MED ORDER — ACETAMINOPHEN 500 MG PO TABS
1000.0000 mg | ORAL_TABLET | Freq: Four times a day (QID) | ORAL | Status: AC
Start: 2023-11-20 — End: 2023-11-21
  Administered 2023-11-20 – 2023-11-21 (×3): 1000 mg via ORAL
  Filled 2023-11-20 (×3): qty 2

## 2023-11-20 MED ORDER — DEXAMETHASONE SODIUM PHOSPHATE 10 MG/ML IJ SOLN
INTRAMUSCULAR | Status: AC
  Filled 2023-11-20: qty 1

## 2023-11-20 MED ORDER — SODIUM CHLORIDE 0.9 % IV SOLN
250.0000 mL | INTRAVENOUS | Status: DC
  Administered 2023-11-20: 250 mL via INTRAVENOUS

## 2023-11-20 MED ORDER — HYDROCODONE-ACETAMINOPHEN 7.5-325 MG PO TABS: 2.0000 | ORAL_TABLET | ORAL | Status: DC | PRN

## 2023-11-20 MED ORDER — ACETAMINOPHEN 650 MG RE SUPP: 650.0000 mg | RECTAL | Status: DC | PRN

## 2023-11-20 MED ORDER — DIAZEPAM 5 MG PO TABS: 5.0000 mg | ORAL_TABLET | Freq: Four times a day (QID) | ORAL | Status: DC | PRN

## 2023-11-20 MED ORDER — PHENYLEPHRINE 80 MCG/ML (10ML) SYRINGE FOR IV PUSH (FOR BLOOD PRESSURE SUPPORT)
PREFILLED_SYRINGE | INTRAVENOUS | Status: DC | PRN
  Administered 2023-11-20: 200 ug via INTRAVENOUS
  Administered 2023-11-20 (×3): 160 ug via INTRAVENOUS
  Administered 2023-11-20: 80 ug via INTRAVENOUS
  Administered 2023-11-20: 160 ug via INTRAVENOUS

## 2023-11-20 MED ORDER — ALBUTEROL SULFATE (2.5 MG/3ML) 0.083% IN NEBU
2.5000 mg | INHALATION_SOLUTION | Freq: Four times a day (QID) | RESPIRATORY_TRACT | Status: DC
  Administered 2023-11-20: 2.5 mg via RESPIRATORY_TRACT
  Filled 2023-11-20: qty 3

## 2023-11-20 MED ORDER — CHLORHEXIDINE GLUCONATE 0.12 % MT SOLN
15.0000 mL | Freq: Once | OROMUCOSAL | Status: AC
  Administered 2023-11-20: 15 mL via OROMUCOSAL
  Filled 2023-11-20: qty 15

## 2023-11-20 MED ORDER — PANTOPRAZOLE SODIUM 40 MG PO TBEC: 40.0000 mg | DELAYED_RELEASE_TABLET | Freq: Every day | ORAL | Status: DC

## 2023-11-20 MED ORDER — THROMBIN 5000 UNITS EX KIT
PACK | CUTANEOUS | Status: AC
  Filled 2023-11-20: qty 2

## 2023-11-20 MED ORDER — LABETALOL HCL 5 MG/ML IV SOLN
INTRAVENOUS | Status: DC | PRN
  Administered 2023-11-20: 2.5 mg via INTRAVENOUS
  Administered 2023-11-20 (×2): 5 mg via INTRAVENOUS

## 2023-11-20 MED ORDER — LIDOCAINE 2% (20 MG/ML) 5 ML SYRINGE
INTRAMUSCULAR | Status: DC | PRN
  Administered 2023-11-20: 40 mg via INTRAVENOUS

## 2023-11-20 MED ORDER — HYDROMORPHONE HCL 1 MG/ML IJ SOLN
INTRAMUSCULAR | Status: AC
Start: 2023-11-20 — End: ?
  Filled 2023-11-20: qty 1

## 2023-11-20 MED ORDER — ONDANSETRON HCL 4 MG PO TABS: 4.0000 mg | ORAL_TABLET | Freq: Four times a day (QID) | ORAL | Status: DC | PRN

## 2023-11-20 MED ORDER — ALBUTEROL SULFATE HFA 108 (90 BASE) MCG/ACT IN AERS: 2.0000 | INHALATION_SPRAY | Freq: Four times a day (QID) | RESPIRATORY_TRACT | Status: DC | PRN

## 2023-11-20 MED ORDER — PHENYLEPHRINE 80 MCG/ML (10ML) SYRINGE FOR IV PUSH (FOR BLOOD PRESSURE SUPPORT)
PREFILLED_SYRINGE | INTRAVENOUS | Status: AC
  Filled 2023-11-20: qty 10

## 2023-11-20 MED ORDER — ASPIRIN 81 MG PO TBEC
81.0000 mg | DELAYED_RELEASE_TABLET | Freq: Every day | ORAL | Status: DC
  Administered 2023-11-20: 81 mg via ORAL
  Filled 2023-11-20: qty 1

## 2023-11-20 MED ORDER — LIDOCAINE-EPINEPHRINE 0.5 %-1:200000 IJ SOLN
INTRAMUSCULAR | Status: DC | PRN
  Administered 2023-11-20: 17 mL

## 2023-11-20 MED ORDER — SODIUM CHLORIDE 0.9% FLUSH: 3.0000 mL | INTRAVENOUS | Status: DC | PRN

## 2023-11-20 MED ORDER — CHLORHEXIDINE GLUCONATE CLOTH 2 % EX PADS: 6.0000 | MEDICATED_PAD | Freq: Once | CUTANEOUS | Status: DC

## 2023-11-20 MED ORDER — ONDANSETRON HCL 4 MG/2ML IJ SOLN
INTRAMUSCULAR | Status: DC | PRN
  Administered 2023-11-20: 4 mg via INTRAVENOUS

## 2023-11-20 MED ORDER — POTASSIUM CHLORIDE IN NACL 20-0.9 MEQ/L-% IV SOLN
INTRAVENOUS | Status: DC
Start: 2023-11-20 — End: 2023-11-21
  Filled 2023-11-20: qty 1000

## 2023-11-20 MED ORDER — FENTANYL CITRATE (PF) 250 MCG/5ML IJ SOLN
INTRAMUSCULAR | Status: AC
  Filled 2023-11-20: qty 5

## 2023-11-20 MED ORDER — SODIUM CHLORIDE 0.9% FLUSH
3.0000 mL | Freq: Two times a day (BID) | INTRAVENOUS | Status: DC
  Administered 2023-11-20 (×2): 3 mL via INTRAVENOUS

## 2023-11-20 MED ORDER — HYDROMORPHONE HCL 1 MG/ML IJ SOLN
INTRAMUSCULAR | Status: AC
  Filled 2023-11-20: qty 1

## 2023-11-20 MED ORDER — METOPROLOL TARTRATE 25 MG PO TABS
25.0000 mg | ORAL_TABLET | Freq: Two times a day (BID) | ORAL | Status: DC
  Administered 2023-11-20 – 2023-11-21 (×2): 25 mg via ORAL
  Filled 2023-11-20 (×2): qty 1

## 2023-11-20 MED ORDER — ONDANSETRON HCL 4 MG/2ML IJ SOLN
4.0000 mg | Freq: Four times a day (QID) | INTRAMUSCULAR | Status: DC | PRN
  Administered 2023-11-20: 4 mg via INTRAVENOUS
  Filled 2023-11-20: qty 2

## 2023-11-20 MED ORDER — PROPOFOL 10 MG/ML IV BOLUS
INTRAVENOUS | Status: AC
  Filled 2023-11-20: qty 20

## 2023-11-20 MED ORDER — ROCURONIUM BROMIDE 10 MG/ML (PF) SYRINGE
PREFILLED_SYRINGE | INTRAVENOUS | Status: AC
  Filled 2023-11-20: qty 10

## 2023-11-20 MED ORDER — 0.9 % SODIUM CHLORIDE (POUR BTL) OPTIME
TOPICAL | Status: DC | PRN
  Administered 2023-11-20 (×2): 1000 mL

## 2023-11-20 MED ORDER — ORAL CARE MOUTH RINSE: 15.0000 mL | Freq: Once | OROMUCOSAL | Status: AC

## 2023-11-20 MED ORDER — TAMSULOSIN HCL 0.4 MG PO CAPS
0.4000 mg | ORAL_CAPSULE | Freq: Every day | ORAL | Status: DC
  Administered 2023-11-20 – 2023-11-21 (×2): 0.4 mg via ORAL
  Filled 2023-11-20 (×2): qty 1

## 2023-11-20 MED ORDER — FENTANYL CITRATE (PF) 250 MCG/5ML IJ SOLN
INTRAMUSCULAR | Status: DC | PRN
  Administered 2023-11-20: 100 ug via INTRAVENOUS
  Administered 2023-11-20: 50 ug via INTRAVENOUS

## 2023-11-20 MED ORDER — ALBUTEROL SULFATE (2.5 MG/3ML) 0.083% IN NEBU
2.5000 mg | INHALATION_SOLUTION | Freq: Two times a day (BID) | RESPIRATORY_TRACT | Status: DC
  Filled 2023-11-20: qty 3

## 2023-11-20 MED ORDER — HYDROMORPHONE HCL 1 MG/ML IJ SOLN
0.2500 mg | INTRAMUSCULAR | Status: DC | PRN
  Administered 2023-11-20: 0.25 mg via INTRAVENOUS
  Administered 2023-11-20 (×3): 0.5 mg via INTRAVENOUS

## 2023-11-20 MED ORDER — MENTHOL 3 MG MT LOZG: 1.0000 | LOZENGE | OROMUCOSAL | Status: DC | PRN

## 2023-11-20 MED ORDER — LIDOCAINE-EPINEPHRINE 0.5 %-1:200000 IJ SOLN
INTRAMUSCULAR | Status: AC
  Filled 2023-11-20: qty 50

## 2023-11-20 MED ORDER — BUDESON-GLYCOPYRROL-FORMOTEROL 160-9-4.8 MCG/ACT IN AERO
2.0000 | INHALATION_SPRAY | RESPIRATORY_TRACT | Status: DC
  Administered 2023-11-20 – 2023-11-21 (×2): 2 via RESPIRATORY_TRACT
  Filled 2023-11-20: qty 5.9

## 2023-11-20 MED ORDER — PHENOL 1.4 % MT LIQD: 1.0000 | OROMUCOSAL | Status: DC | PRN

## 2023-11-20 MED ORDER — ACETAMINOPHEN 500 MG PO TABS
1000.0000 mg | ORAL_TABLET | Freq: Once | ORAL | Status: AC
  Administered 2023-11-20: 1000 mg via ORAL
  Filled 2023-11-20: qty 2

## 2023-11-20 MED ORDER — DEXAMETHASONE SODIUM PHOSPHATE 10 MG/ML IJ SOLN
INTRAMUSCULAR | Status: DC | PRN
  Administered 2023-11-20: 5 mg via INTRAVENOUS

## 2023-11-20 MED ORDER — ROCURONIUM BROMIDE 10 MG/ML (PF) SYRINGE
PREFILLED_SYRINGE | INTRAVENOUS | Status: DC | PRN
  Administered 2023-11-20: 50 mg via INTRAVENOUS
  Administered 2023-11-20: 10 mg via INTRAVENOUS
  Administered 2023-11-20: 5 mg via INTRAVENOUS

## 2023-11-20 MED ORDER — ACETAMINOPHEN 325 MG PO TABS: 650.0000 mg | ORAL_TABLET | ORAL | Status: DC | PRN

## 2023-11-20 MED ORDER — LACTATED RINGERS IV SOLN: INTRAVENOUS | Status: DC

## 2023-11-20 MED ORDER — PROPOFOL 10 MG/ML IV BOLUS
INTRAVENOUS | Status: DC | PRN
  Administered 2023-11-20: 100 mg via INTRAVENOUS

## 2023-11-20 MED ORDER — BUPIVACAINE HCL (PF) 0.5 % IJ SOLN
INTRAMUSCULAR | Status: AC
  Filled 2023-11-20: qty 30

## 2023-11-20 MED ORDER — CEFAZOLIN SODIUM-DEXTROSE 2-4 GM/100ML-% IV SOLN
2.0000 g | INTRAVENOUS | Status: AC
  Administered 2023-11-20: 2 g via INTRAVENOUS
  Filled 2023-11-20: qty 100

## 2023-11-20 MED ORDER — DROPERIDOL 2.5 MG/ML IJ SOLN: 0.6250 mg | Freq: Once | INTRAMUSCULAR | Status: DC | PRN

## 2023-11-20 SURGICAL SUPPLY — 58 items
BAG COUNTER SPONGE SURGICOUNT (BAG) ×1 IMPLANT
BENZOIN TINCTURE PRP APPL 2/3 (GAUZE/BANDAGES/DRESSINGS) IMPLANT
BLADE CLIPPER SURG (BLADE) IMPLANT
BUR MATCHSTICK NEURO 3.0 LAGG (BURR) ×1 IMPLANT
BUR PRECISION FLUTE 5.0 (BURR) IMPLANT
CANISTER SUCTION 3000ML PPV (SUCTIONS) ×1 IMPLANT
CEMENT KYPHON C01A KIT/MIXER (Cement) IMPLANT
DERMABOND ADVANCED .7 DNX12 (GAUZE/BANDAGES/DRESSINGS) ×1 IMPLANT
DRAPE C-ARM 42X72 X-RAY (DRAPES) ×2 IMPLANT
DRAPE C-ARMOR (DRAPES) IMPLANT
DRAPE LAPAROTOMY 100X72X124 (DRAPES) ×1 IMPLANT
DRAPE MICROSCOPE SLANT 54X150 (MISCELLANEOUS) ×1 IMPLANT
DRAPE SURG 17X23 STRL (DRAPES) ×1 IMPLANT
DURAPREP 26ML APPLICATOR (WOUND CARE) ×1 IMPLANT
ELECTRODE REM PT RTRN 9FT ADLT (ELECTROSURGICAL) ×1 IMPLANT
GAUZE 4X4 16PLY ~~LOC~~+RFID DBL (SPONGE) IMPLANT
GAUZE SPONGE 4X4 12PLY STRL (GAUZE/BANDAGES/DRESSINGS) IMPLANT
GLOVE BIOGEL PI IND STRL 7.0 (GLOVE) IMPLANT
GLOVE ECLIPSE 6.5 STRL STRAW (GLOVE) ×1 IMPLANT
GLOVE EXAM NITRILE XL STR (GLOVE) IMPLANT
GLOVE SURG SS PI 6.5 STRL IVOR (GLOVE) IMPLANT
GOWN STRL REUS W/ TWL LRG LVL3 (GOWN DISPOSABLE) ×2 IMPLANT
GOWN STRL REUS W/ TWL XL LVL3 (GOWN DISPOSABLE) IMPLANT
GOWN STRL REUS W/TWL 2XL LVL3 (GOWN DISPOSABLE) IMPLANT
GUIDEWIRE NITI 1.4X495 2-PK (WIRE) IMPLANT
KIT BASIN OR (CUSTOM PROCEDURE TRAY) ×1 IMPLANT
KIT TURNOVER KIT B (KITS) ×1 IMPLANT
NDL BIOPSY DD SERENGETI 8G (NEEDLE) IMPLANT
NDL HYPO 25X1 1.5 SAFETY (NEEDLE) ×1 IMPLANT
NDL SPNL 18GX3.5 QUINCKE PK (NEEDLE) IMPLANT
NEEDLE BIOPSY DD SERENGETI 8G (NEEDLE) ×2 IMPLANT
NEEDLE HYPO 25X1 1.5 SAFETY (NEEDLE) ×1 IMPLANT
NEEDLE SPNL 18GX3.5 QUINCKE PK (NEEDLE) IMPLANT
NS IRRIG 1000ML POUR BTL (IV SOLUTION) ×1 IMPLANT
PACK LAMINECTOMY NEURO (CUSTOM PROCEDURE TRAY) ×1 IMPLANT
PACK NDL FENS EVER 10-15G (MISCELLANEOUS) IMPLANT
PACK NEEDLE FENS EVER 10-15G (MISCELLANEOUS) ×4 IMPLANT
PACK PLUNGER EVER SHORT 10G (MISCELLANEOUS) IMPLANT
PAD ARMBOARD POSITIONER FOAM (MISCELLANEOUS) ×3 IMPLANT
PATTIES SURGICAL .5 X.5 (GAUZE/BANDAGES/DRESSINGS) ×1 IMPLANT
PATTIES SURGICAL 1X1 (DISPOSABLE) ×1 IMPLANT
ROD CONT BN 5.5X70 (Rod) IMPLANT
ROD CONT BN EVEREST 5.5X75 (Rod) IMPLANT
SCREW CANN PA EVEREST 5.5X40 (Screw) IMPLANT
SCREW PA FENS EVEREST 5.5X45 (Screw) IMPLANT
SCREW PA FENS EVEREST 6.5X45 (Screw) IMPLANT
SET SCREW VRST (Screw) IMPLANT
SPIKE FLUID TRANSFER (MISCELLANEOUS) ×1 IMPLANT
SPONGE SURGIFOAM ABS GEL SZ50 (HEMOSTASIS) ×1 IMPLANT
SPONGE T-LAP 4X18 ~~LOC~~+RFID (SPONGE) IMPLANT
STRIP CLOSURE SKIN 1/2X4 (GAUZE/BANDAGES/DRESSINGS) IMPLANT
SUT VIC AB 0 CT1 18XCR BRD8 (SUTURE) ×1 IMPLANT
SUT VIC AB 2-0 CT1 18 (SUTURE) ×1 IMPLANT
SUT VIC AB 3-0 SH 8-18 (SUTURE) ×1 IMPLANT
SYR 30ML SLIP (SYRINGE) ×1 IMPLANT
TOWEL GREEN STERILE (TOWEL DISPOSABLE) ×1 IMPLANT
TOWEL GREEN STERILE FF (TOWEL DISPOSABLE) ×1 IMPLANT
WATER STERILE IRR 1000ML POUR (IV SOLUTION) ×1 IMPLANT

## 2023-11-20 NOTE — Anesthesia Procedure Notes (Signed)
 Procedure Name: Intubation Date/Time: 11/20/2023 9:16 AM  Performed by: Basilio Limb, CRNAPre-anesthesia Checklist: Patient identified, Emergency Drugs available, Suction available and Patient being monitored Patient Re-evaluated:Patient Re-evaluated prior to induction Oxygen  Delivery Method: Circle System Utilized Preoxygenation: Pre-oxygenation with 100% oxygen  Induction Type: IV induction Ventilation: Mask ventilation without difficulty and Oral airway inserted - appropriate to patient size Laryngoscope Size: Miller and 3 Grade View: Grade I Tube type: Oral Tube size: 7.5 mm Number of attempts: 1 Airway Equipment and Method: Stylet and Oral airway Placement Confirmation: ETT inserted through vocal cords under direct vision, positive ETCO2 and breath sounds checked- equal and bilateral Secured at: 22 cm Tube secured with: Tape Dental Injury: Teeth and Oropharynx as per pre-operative assessment

## 2023-11-20 NOTE — OR Nursing (Signed)
 Kyphon hv-r bone cement lot [phone] expires 07/18/26 used during procedure

## 2023-11-20 NOTE — Op Note (Signed)
 11/20/2023  4:13 PM  PATIENT:  [name]  83 y.o. male  PRE-OPERATIVE DIAGNOSIS:  Compression fracture of Lumbar four vertebra  POST-OPERATIVE DIAGNOSIS:  Compression fracture of Lumbar four vertebra  PROCEDURE:  Procedure(s): Open reduction internal fixation lumbar four ,percutaneous pedicle screw placement with cement augmentation  L3-L5  SURGEON: Surgeon(s): Audie Bleacher, MD  ASSISTANTS:none  ANESTHESIA:   local and general  EBL:  Total I/O In: 800 [I.V.:800] Out: 30 [Blood:30]  BLOOD ADMINISTERED:none  CELL SAVER GIVEN:not used  COUNT:per nursing  DRAINS: none   SPECIMEN:  No Specimen  DICTATION: GERIK COBERLY was taken to the operating room, intubated, and placed under a general anesthetic without difficulty. He was positioned prone on the [place] table with all pressure points properly padded. His lumbar region was prepped and draped in a sterile manner. Using fluoroscopy I localized the pedicles at L3, the left pedicle at L4, and pedicles at L5. All screws were placed in a similar fashion. I infiltrated lidocaine  into my planned trajectory to the pedicle. I made a small incision in the skin. I with fluoroscopic guidance inserted a Jamshidi needle in the pedicles of L3, left L4, and L5. I placed a Kwire through the needle into the vertebral bodies. I placed cannulated pedicle screws over the wire into the vertebrae. I injected cement into the screws which was seen in the verterbral bodies on fluoroscopy. I placed rods through the screw heads and towers. I secured the rod using locking caps placed through the towers. I used the torque screw driver to tighten the caps. I removed the towers and closed the incisions. Final films on fluoroscopy in the AP and Lateral views looked good. He was rolled onto the stretcher, extubated, and moving all extremities.   PLAN OF CARE: Admit to inpatient   PATIENT DISPOSITION:  PACU - hemodynamically stable.   Delay start of  Pharmacological VTE agent (>24hrs) due to surgical blood loss or risk of bleeding:  no

## 2023-11-20 NOTE — H&P (Signed)
 BP (!) 156/82   Pulse 89   Temp 97.6 F (36.4 C) (Oral)   Resp 18   Ht 5\' 6"  (1.676 m)   Wt 77.1 kg   SpO2 95%   BMI 27.44 kg/m  [name] comes in today secondary to a compression fracture which he sustained at the end of January.  He was seen and evaluated and told that the fracture would heal on its own.  He did have a CT of the abdomen and he should be better.  The pain did not improve, however, and only got worse.  He is now using a walker and has a wheelchair, but he also hurt his right wrist and that is exacerbated with the use of the walker.  He was seen by Dr. Marland Silvas approximately 2 weeks ago as he knew Dr. Marland Silvas from his previous time with him, I believe, and Dr. Marland Silvas ordered another scan that showed that the compression fracture had progressed and he was sent to me for further evaluation.  He is an 83 year old gentleman with history of COPD.  He says the pain is 10/10.  He can sit for 30 minutes.  He can stand not at all and he can walk not at all.  Back pain is described as aching, deep, heavy, knife-like, lancinating, pressure, sharp, shooting, twisting, hurts when standing or sitting.  He fell on his back.  He does have some extremity weakness.  He does have some incontinence of his bowels and difficulty walking.  PAST MEDICAL HISTORY: Significant for lung disease, arrhythmia, gastrointestinal disease.  He does take Coumadin  because of the arrhythmia and because of a DVT he sustained.  He has undergone an appendectomy and herniorrhaphy.  He has fallen twice within the last year.  He fractured his pelvis recently and was told that would heal on its own.  He lives with his wife and children.  He used to smoke, stopped smoking at age 58. He bruises very easily.  He does have back pain.  MEDICATIONS: Currently, he is taking Tylenol  Arthritis, Coumadin  that he lists, Famotidine , and Metoprolol .  ALLERGIES: He has no known drug allergies.  PHYSICAL EXAMINATION: Vital Signs:   Temperature is 97.9, blood pressure 154/83, pulse 86. Pain sitting in the wheelchair is 6/10.  General:  On exam, he is alert, oriented by 4. He answers all questions appropriately.  Memory, language, attention span, and fund of knowledge are normal.  No reflexes elicited at the right knee or right ankle, 2+ at the left knee, 1+ at the left ankle, 2+ at the biceps, triceps, brachioradialis.  Pupils equal, round, and reactive to light.  Speech is clear and fluent.  He has symmetric facial movements.  Hearing intact to voice.  He has normal strength in the upper extremities.  ASSESSMENT AND PLAN: I did not try to have him get up to walk.  I looked at the CTs done 2 weeks ago, on one done in February and there is significant progression of the fracture in that time period, and at this point, I think the fracture is too bad to try to use cement, so I do not think a kyphoplasty could be done.  What I explained to [name], was that I do believe he should have this treated because it is only getting worse and percutaneous pedicle screws could be used and I would certainly put cement through the pedicle screws into the L3 and L5 vertebral bodies.  I would only do 1 above  and 1 below.  I would expect him to be able to get up and be discharged the next day.  But, I do think that his fracture will continue to get worse and that the pain will also continue to get worse.  We spoke at great length, well over 30 minutes, and I believe I answered all his questions. He would also like to just go home, think about it and he will give me a call.  But, I will try to get him on the schedule as quickly as possible, if and when he does call, to pursue the procedure.

## 2023-11-20 NOTE — Anesthesia Postprocedure Evaluation (Signed)
 Anesthesia Post Note  Patient: [name]  Procedure(s) Performed: Open reduction internal fixation lumbar four ,percutaneous pedicle screw placement with cement augmentation     Patient location during evaluation: PACU Anesthesia Type: General Level of consciousness: awake and alert Pain management: pain level controlled Vital Signs Assessment: post-procedure vital signs reviewed and stable Respiratory status: spontaneous breathing, nonlabored ventilation, respiratory function stable and patient connected to nasal cannula oxygen  Cardiovascular status: blood pressure returned to baseline and stable Postop Assessment: no apparent nausea or vomiting Anesthetic complications: no   No notable events documented.  Last Vitals:  Vitals:   11/20/23 1345 11/20/23 1404  BP: (!) 161/73 (!) 172/82  Pulse: 71 69  Resp: 12 20  Temp: 36.7 C 36.4 C  SpO2: 95% 93%    Last Pain:  Vitals:   11/20/23 1345  TempSrc:   PainSc: 3                  Jaylen Claude P Nasiya Pascual

## 2023-11-20 NOTE — Plan of Care (Signed)

## 2023-11-20 NOTE — Transfer of Care (Signed)
 Immediate Anesthesia Transfer of Care Note  Patient: LAWYER WASHABAUGH  Procedure(s) Performed: LAMINECTOMY WITH POSTERIOR LATERAL ARTHRODESIS LEVEL Open reduction internal fixation lumbar four ,percutaneous pedicle screw placement with cement augmentation  Patient Location: PACU  Anesthesia Type:General  Level of Consciousness: awake, alert , and oriented  Airway & Oxygen  Therapy: Patient Spontanous Breathing and Patient connected to face mask oxygen   Post-op Assessment: Report given to RN and Post -op Vital signs reviewed and stable  Post vital signs: Reviewed and stable  Last Vitals:  Vitals Value Taken Time  BP 153/85 11/20/23 1130  Temp 36.8 C 11/20/23 1124  Pulse 77 11/20/23 1135  Resp 25 11/20/23 1135  SpO2 92 % 11/20/23 1135  Vitals shown include unfiled device data.  Last Pain:  Vitals:   11/20/23 1124  TempSrc:   PainSc: 0-No pain      Patients Stated Pain Goal: 3 (11/20/23 4540)  Complications: No notable events documented.

## 2023-11-21 ENCOUNTER — Encounter (HOSPITAL_COMMUNITY): Payer: Self-pay | Admitting: Neurosurgery

## 2023-11-21 MED ORDER — ALBUTEROL SULFATE (2.5 MG/3ML) 0.083% IN NEBU: 2.5000 mg | INHALATION_SOLUTION | RESPIRATORY_TRACT | Status: DC | PRN

## 2023-11-21 MED ORDER — TIZANIDINE HCL 4 MG PO TABS: 4.0000 mg | ORAL_TABLET | Freq: Three times a day (TID) | ORAL | 0 refills | Status: DC | PRN

## 2023-11-21 MED ORDER — HYDROCODONE-ACETAMINOPHEN 5-325 MG PO TABS: 1.0000 | ORAL_TABLET | ORAL | 0 refills | Status: DC | PRN

## 2023-11-21 MED FILL — Thrombin For Soln 5000 Unit: CUTANEOUS | Qty: 2 | Status: AC

## 2023-11-21 NOTE — Progress Notes (Signed)
 Patient awaiting family for discharge home, Patient in no acute distress nor complaints of pain nor discomfort; incision on back is clean, dry and intact; No c/o pain at this time. Room was checked and accounted for all patient's belongings; discharge instructions concerning his medications, incision care, follow up appointment and when to call the doctor as needed were all discussed with patient by RN and he and his family expressed understanding on the instructions given.

## 2023-11-21 NOTE — Progress Notes (Signed)
 Physical Therapy Treatment  Patient Details Name: [name] MRN: [id number] DOB: [birth year] Today's Date: 11/21/2023   History of Present Illness Pt is an 83 y/o male who presents s/p open reduction internal fixation lumbar four, percutaneous pedicle screw placement with cement augmentation L3-L5 on 11/20/2023. PMH significant for Anxiety, CAD, CA, COPD, CVA, EF 55-60%, DVT, a-fib, R knee surgery, BPH s/p prostate resection.    PT Comments  Pt received attempting to ambulate from the room out to the hall where family had the transport chair waiting. Brace was not donned. Multimodal cues throughout for safety and pt was assisted into the transport chair. Increased time required due to difficulty advancing RLE and turning with RW. Brace donned in sitting. Education provided on optimal safety practice, brace application/wearing schedule. Will continue to follow and progress as able per POC.    If plan is discharge home, recommend the following: A little help with walking and/or transfers;A little help with bathing/dressing/bathroom;Assistance with cooking/housework;Help with stairs or ramp for entrance;Assist for transportation   Can travel by private vehicle        Equipment Recommendations  None recommended by PT    Recommendations for Other Services       Precautions / Restrictions Precautions Precautions: Fall;Back Precaution Booklet Issued: Yes (comment) Recall of Precautions/Restrictions: Intact Precaution/Restrictions Comments: Reviewed handout and pt was cued for precautions during functional mobility and ADLs Required Braces or Orthoses: Spinal Brace Spinal Brace: Lumbar corset;Applied in sitting position Restrictions Weight Bearing Restrictions Per Provider Order: No     Mobility  Bed Mobility               General bed mobility comments: Pt was received sitting up in recliner    Transfers Overall transfer level: Needs assistance Equipment used: Rolling walker  (2 wheels) Transfers: Sit to/from Stand Sit to Stand: Min assist           General transfer comment: VC's for hand placement on seated surface for safety for stand>sit. Assist for guiding hips down to the chair.    Ambulation/Gait Ambulation/Gait assistance: Contact guard assist, Min assist Gait Distance (Feet): 15 Feet Assistive device: Rolling walker (2 wheels) Gait Pattern/deviations: Decreased stride length, Decreased weight shift to right, Decreased dorsiflexion - right, Decreased stance time - right, Decreased step length - right, Shuffle Gait velocity: Decreased Gait velocity interpretation: <1.31 ft/sec, indicative of household ambulator   General Gait Details: VC's for improved posture, closer walker proximity, and forward gaze. Assist for balance support, safety, and walker management. Multimodal cues for increased R step length.   Stairs Stairs: Yes Stairs assistance: Min assist Stair Management: Two rails, Step to pattern, Forwards Number of Stairs: 1 (x3) General stair comments: VC's for sequencing and general safety.   Wheelchair Mobility     Tilt Bed    Modified Rankin (Stroke Patients Only)       Balance Overall balance assessment: Needs assistance Sitting-balance support: No upper extremity supported, Feet supported Sitting balance-Leahy Scale: Fair Sitting balance - Comments: Did not do posterior lean with OT today while seated unless he was trying to cross his legs in a "figure 4" posttion Postural control: Posterior lean Standing balance support: No upper extremity supported, Reliant on assistive device for balance, Bilateral upper extremity supported Standing balance-Leahy Scale: Poor                              Communication Communication Communication: No apparent difficulties  Cognition Arousal: Alert Behavior During Therapy: WFL for tasks assessed/performed   PT - Cognitive impairments: No apparent impairments                          Following commands: Intact      Cueing Cueing Techniques: Verbal cues, Gestural cues  Exercises      General Comments        Pertinent Vitals/Pain Pain Assessment Pain Assessment: Faces Faces Pain Scale: Hurts even more Pain Location: back Pain Descriptors / Indicators: Operative site guarding, Sore Pain Intervention(s): Limited activity within patient's tolerance, Monitored during session, Repositioned    Home Living Family/patient expects to be discharged to:: Private residence Living Arrangements: Spouse/significant other Available Help at Discharge: Family;Available 24 hours/day Type of Home: House Home Access: Stairs to enter Entrance Stairs-Rails: Right;Left;Can reach both Entrance Stairs-Number of Steps: 1 down no rail, 2 up 2 rails, 1 into house (hold onto door frame) Alternate Level Stairs-Number of Steps: 13 bilat rails to man cave in basement, has main level bedroom/bathroom or upstairs bedroom with 13 steps Home Layout: Multi-level;Able to live on main level with bedroom/bathroom Home Equipment: Rolling Walker (2 wheels);Cane - single point;Shower seat;BSC/3in1      Prior Function            PT Goals (current goals can now be found in the care plan section) Acute Rehab PT Goals Patient Stated Goal: Home today PT Goal Formulation: With patient/family Time For Goal Achievement: 11/28/23 Potential to Achieve Goals: Good Progress towards PT goals: Progressing toward goals    Frequency    Min 5X/week      PT Plan      Co-evaluation              AM-PAC PT "6 Clicks" Mobility   Outcome Measure  Help needed turning from your back to your side while in a flat bed without using bedrails?: A Little Help needed moving from lying on your back to sitting on the side of a flat bed without using bedrails?: A Little Help needed moving to and from a bed to a chair (including a wheelchair)?: A Little Help needed standing up from a  chair using your arms (e.g., wheelchair or bedside chair)?: A Little Help needed to walk in hospital room?: A Little Help needed climbing 3-5 steps with a railing? : A Little 6 Click Score: 18    End of Session Equipment Utilized During Treatment: Gait belt;Back brace Activity Tolerance: Patient tolerated treatment well Patient left: in chair;with call bell/phone within reach;with family/visitor present Nurse Communication: Mobility status PT Visit Diagnosis: Unsteadiness on feet (R26.81);Pain Pain - part of body:  (back)     Time: 1130-1140 PT Time Calculation (min) (ACUTE ONLY): 10 min  Charges:    $Gait Training: 8-22 mins PT General Charges $$ ACUTE PT VISIT: 1 Visit                     Simone Dubois, PT, DPT Acute Rehabilitation Services Secure Chat Preferred Office: [phone]    Venus Ginsberg 11/21/2023, 11:57 AM

## 2023-11-21 NOTE — Discharge Instructions (Signed)

## 2023-11-21 NOTE — TOC Transition Note (Signed)
 Transition of Care [facility]) - Discharge Note   Patient Details  Name: [name] MRN: [id number] Date of Birth: Oct 27, 1940  [facility] CM/SW Contact:  Jonathan Neighbor, RN Phone Number: 11/21/2023, 11:35 AM   Clinical Narrative:     Pt is discharging home with his family.  Home health arranged with [facility]. Information on the AVS. Needed information faxed to: [phone] Family transporting home.  Final next level of care: Home w Home Health Services Barriers to Discharge: No Barriers Identified   Patient Goals and CMS Choice            Discharge Placement                       Discharge Plan and Services Additional resources added to the After Visit Summary for                            [facility] Arranged: PT HH Agency: [facility] Date [facility] Agency Contacted: 11/21/23      Social Drivers of Health (SDOH) Interventions SDOH Screenings   Food Insecurity: No Food Insecurity (05/07/2023)   Received from [facility]  Housing: Low Risk  (05/07/2023)   Received from [facility]  Transportation Needs: No Transportation Needs (05/07/2023)   Received from [facility]  Utilities: Not At Risk (05/07/2023)   Received from [facility]  Alcohol Screen: Low Risk  (03/26/2023)  Depression (PHQ2-9): Low Risk  (11/08/2023)  Financial Resource Strain: Low Risk  (05/07/2023)   Received from [facility]  Physical Activity: Insufficiently Active (03/26/2023)  Social Connections: Unknown (04/21/2023)   Received from [facility]  Stress: No Stress Concern Present (03/26/2023)  Tobacco Use: Medium Risk (11/08/2023)  Health Literacy: Adequate Health Literacy (03/26/2023)     Readmission Risk Interventions     No data to display

## 2023-11-21 NOTE — Discharge Summary (Signed)
 Physician Discharge Summary  Patient ID: [name] MRN: [id number] DOB/AGE: 1941/02/15 83 y.o.  Admit date: 11/20/2023 Discharge date: 11/21/2023  Admission Diagnoses:Lumbar L4 compression fracture  Discharge Diagnoses: samee Principal Problem:   Lumbar compression fracture [facility])   Discharged Condition: good  Hospital Course: [name] was admitted and taken to the operating room for an uncomplicated lumbar percutaneous ORIF with bilateral screws at L3, L5,and left L4. Post op he has worked well with PT and OT.  Treatments: surgery: Open reduction internal fixation lumbar four ,percutaneous pedicle screw placement with cement augmentation  L3-L5   Discharge Exam: Blood pressure 131/67, pulse 82, temperature 98.4 F (36.9 C), temperature source Oral, resp. rate 17, height 5\' 6"  (1.676 m), weight 77.1 kg, SpO2 96%. General appearance: alert, cooperative, appears stated age, and mild distress  Disposition: Discharge disposition: 01-Home or Self Care      Compression fracture of Lumbar four vertebra  Allergies as of 11/21/2023       Reactions   Asa [aspirin]    GI upset   Niacin    hot flashes   Pravastatin     myalgias   Rosuvastatin     myalgias   Simvastatin     Myalgias   Tramadol  Other (See Comments)   hallucinations        Medication List     PAUSE taking these medications    acetaminophen -codeine 300-15 MG tablet Wait to take this until your doctor or other care provider tells you to start again. Commonly known as: TYLENOL  #2 Take 1 tablet by mouth every 6 (six) hours as needed for severe pain (pain score 7-10).       TAKE these medications    albuterol  108 (90 Base) MCG/ACT inhaler Commonly known as: VENTOLIN  HFA Inhale 2 puffs into the lungs every 6 (six) hours as needed for wheezing or shortness of breath.   albuterol  (2.5 MG/3ML) 0.083% nebulizer solution Commonly known as: PROVENTIL  USE 1 VIAL IN NEBULIZER EVERY 6 HOURS AS NEEDED FOR  WHEEZING FOR SHORTNESS OF BREATH   aspirin EC 81 MG tablet Take 81 mg by mouth at bedtime. Swallow whole.   Breztri  Aerosphere 160-9-4.8 MCG/ACT Aero inhaler Generic drug: budesonide -glycopyrrolate-formoterol  Inhale 2 puffs into the lungs in the morning and at bedtime.   cyanocobalamin  1000 MCG/ML injection Commonly known as: VITAMIN B12 Inject 1,000 mcg as directed every 30 (thirty) days.   enoxaparin  120 MG/0.8ML injection Commonly known as: LOVENOX  Inject 0.8 mLs (120 mg total) into the skin daily.   guaiFENesin 600 MG 12 hr tablet Commonly known as: MUCINEX Take 600 mg by mouth daily.   HYDROcodone-acetaminophen  5-325 MG tablet Commonly known as: NORCO/VICODIN Take 1 tablet by mouth every 4 (four) hours as needed for moderate pain (pain score 4-6).   ketorolac  0.5 % ophthalmic solution Commonly known as: ACULAR  PLACE 1 DROP INTO THE LEFT EYE 4 TIMES DAILY.   metoprolol  tartrate 25 MG tablet Commonly known as: LOPRESSOR  Take 1 tablet (25 mg total) by mouth 2 (two) times daily.   omeprazole  20 MG capsule Commonly known as: PRILOSEC Take 20 mg by mouth daily as needed (heartburn).   Repatha  SureClick 140 MG/ML Soaj Generic drug: Evolocumab  Inject 140 mg into the skin every 14 (fourteen) days.   tiZANidine 4 MG tablet Commonly known as: ZANAFLEX Take 1 tablet (4 mg total) by mouth every 8 (eight) hours as needed for muscle spasms.   warfarin 5 MG tablet Commonly known as: COUMADIN  Take as directed. If you are  unsure how to take this medication, talk to your nurse or doctor. Original instructions: TAKE 1/2 TO 1 TABLET BY MOUTH ONCE DAILY AS DIRECTED BY COUMADIN  CLINIC        Follow-up Information     Audie Bleacher, MD Follow up.   Specialty: Neurosurgery Why: keep your scheduled appointment Contact information: [address] [place] [address] [phone]                 Signed: Audie Bleacher 11/21/2023, 9:56 AM

## 2023-11-21 NOTE — Evaluation (Signed)
 Occupational Therapy Evaluation and Discharge Patient Details Name: [name] MRN: [id number] DOB: May 20, 1941 Today's Date: 11/21/2023   History of Present Illness   Pt is an 83 y/o male who presents s/p open reduction internal fixation lumbar four, percutaneous pedicle screw placement with cement augmentation L3-L5 on 11/20/2023. PMH significant for Anxiety, CAD, CA, COPD, CVA, EF 55-60%, DVT, a-fib, R knee surgery, BPH s/p prostate resection.     Clinical Impressions This 83 yo male admitted and underwent above presents to acute OT with all education completed and handotu provided/reviewed with pt and family. No further OT needs, we will D/C from acute OT.     If plan is discharge home, recommend the following:   A little help with walking and/or transfers;A lot of help with bathing/dressing/bathroom;Assistance with cooking/housework;Assist for transportation;Help with stairs or ramp for entrance     Functional Status Assessment   Patient has had a recent decline in their functional status and demonstrates the ability to make significant improvements in function in a reasonable and predictable amount of time.     Equipment Recommendations   None recommended by OT      Precautions/Restrictions   Precautions Precautions: Fall;Back Precaution Booklet Issued: Yes (comment) Recall of Precautions/Restrictions: Intact Precaution/Restrictions Comments: Reviewed handout and pt was cued for precautions during functional mobility and ADLs Required Braces or Orthoses: Spinal Brace Spinal Brace: Lumbar corset;Applied in sitting position Restrictions Weight Bearing Restrictions Per Provider Order: No     Mobility Bed Mobility Overal bed mobility: Needs Assistance Bed Mobility: Rolling, Sidelying to Sit, Sit to Sidelying Rolling: Min assist Sidelying to sit: Min assist (VCs and A for trunk)     Sit to sidelying: Min assist (VCs and A for legs) General bed mobility  comments: VCs for sequencing of it all    Transfers Overall transfer level: Needs assistance Equipment used: Rolling walker (2 wheels) Transfers: Sit to/from Stand Sit to Stand: Min assist           General transfer comment: Assist for power up to full stand. VC's for hand placement on seated surface for safety. From seat on rollator pt had difficulty pushing up from arm rests and allowed hands on RW to stand from there. Gave pt a walker splint for RUE to hold his hand on better with less grip due to painful thumb joint due to recent fall      Balance Overall balance assessment: Needs assistance Sitting-balance support: No upper extremity supported, Feet supported Sitting balance-Leahy Scale: Good Sitting balance - Comments: Did not do posterior lean with OT today while seated unless he was trying to cross his legs in a "figure 4" posttion   Standing balance support: No upper extremity supported, Reliant on assistive device for balance, Bilateral upper extremity supported Standing balance-Leahy Scale: Poor                             ADL either performed or assessed with clinical judgement   ADL                                         General ADL Comments: Educated and provided handout/reviewed with pt and dtr on use of wet wipes for back peri care, use of 2 cups for brushing teeth, building up sitting tolerance, use of pillows for back and side sleeping, not  long sitting in recliner, and looking forward or up with sit<>stand to keep back more straight.     Vision Patient Visual Report: No change from baseline              Pertinent Vitals/Pain Pain Assessment Pain Assessment: Faces Faces Pain Scale: Hurts even more Pain Location: back Pain Descriptors / Indicators: Operative site guarding, Sore Pain Intervention(s): Limited activity within patient's tolerance, Monitored during session, Repositioned     Extremity/Trunk Assessment Upper  Extremity Assessment Upper Extremity Assessment: Overall WFL for tasks assessed       Communication Communication Communication: No apparent difficulties   Cognition Arousal: Alert Behavior During Therapy: WFL for tasks assessed/performed Cognition: No apparent impairments                               Following commands: Intact                  Home Living Family/patient expects to be discharged to:: Private residence Living Arrangements: Spouse/significant other Available Help at Discharge: Family;Available 24 hours/day Type of Home: House Home Access: Stairs to enter [employer] of Steps: 1 down no rail, 2 up 2 rails, 1 into house (hold onto door frame) Entrance Stairs-Rails: Right;Left;Can reach both Home Layout: Multi-level;Able to live on main level with bedroom/bathroom Alternate Level Stairs-Number of Steps: 13 bilat rails to man cave in basement, has main level bedroom/bathroom or upstairs bedroom with 13 steps   Bathroom Shower/Tub: Producer, television/film/video: Handicapped height     Home Equipment: Agricultural consultant (2 wheels);Cane - single point;Shower seat;BSC/3in1          Prior Functioning/Environment Prior Level of Function : Independent/Modified Independent                    OT Problem List: Decreased strength;Decreased range of motion;Pain        OT Goals(Current goals can be found in the care plan section)   Acute Rehab OT Goals Patient Stated Goal: to go home today         AM-PAC OT "6 Clicks" Daily Activity     Outcome Measure Help from another person eating meals?: None Help from another person taking care of personal grooming?: A Little (setup in sitting) Help from another person toileting, which includes using toliet, bedpan, or urinal?: A Little Help from another person bathing (including washing, rinsing, drying)?: A Lot Help from another person to put on and taking off regular upper body  clothing?: A Little Help from another person to put on and taking off regular lower body clothing?: A Lot 6 Click Score: 17   End of Session Equipment Utilized During Treatment: Gait belt;Rolling walker (2 wheels);Back brace Nurse Communication:  (need for HHOT)  Activity Tolerance: Patient tolerated treatment well Patient left:  (working with PT)  OT Visit Diagnosis: Unsteadiness on feet (R26.81);Other abnormalities of gait and mobility (R26.89);Pain Pain - part of body:  (incisional)                Time: 1610-9604 OT Time Calculation (min): 27 min Charges:  OT General Charges $OT Visit: 1 Visit OT Evaluation $OT Eval Moderate Complexity: 1 Mod OT Treatments $Self Care/Home Management : 8-22 mins  [name] OT Acute Rehabilitation Services Office [phone]    [name] 11/21/2023, 11:06 AM

## 2023-11-21 NOTE — Evaluation (Signed)
 Physical Therapy Evaluation Patient Details Name: [name] MRN: [id number] DOB: 1940/08/10 Today's Date: 11/21/2023  History of Present Illness  Pt is an 83 y/o male who presents s/p open reduction internal fixation lumbar four, percutaneous pedicle screw placement with cement augmentation L3-L5 on 11/20/2023. PMH significant for Anxiety, CAD, CA, COPD, CVA, EF 55-60%, DVT, a-fib, R knee surgery, BPH s/p prostate resection.   Clinical Impression  Pt admitted with above diagnosis. At the time of PT eval, pt was able to demonstrate transfers and ambulation with gross min assist and RW for support. Pt was educated on precautions, brace application/wearing schedule, appropriate activity progression, and car transfer. Pt currently with functional limitations due to the deficits listed below (see PT Problem List). Pt will benefit from skilled PT to increase their independence and safety with mobility to allow discharge to the venue listed below.          If plan is discharge home, recommend the following: A little help with walking and/or transfers;A little help with bathing/dressing/bathroom;Assistance with cooking/housework;Help with stairs or ramp for entrance;Assist for transportation   Can travel by private vehicle        Equipment Recommendations None recommended by PT  Recommendations for Other Services       Functional Status Assessment Patient has had a recent decline in their functional status and demonstrates the ability to make significant improvements in function in a reasonable and predictable amount of time.     Precautions / Restrictions Precautions Precautions: Fall;Back Precaution Booklet Issued: Yes (comment) Recall of Precautions/Restrictions: Intact Precaution/Restrictions Comments: Reviewed handout and pt was cued for precautions during functional mobility. Required Braces or Orthoses: Spinal Brace Spinal Brace: Lumbar corset;Applied in sitting  position Restrictions Weight Bearing Restrictions Per Provider Order: No      Mobility  Bed Mobility               General bed mobility comments: Pt was received sitting up in recliner    Transfers Overall transfer level: Needs assistance Equipment used: Rolling walker (2 wheels), Rollator (4 wheels) Transfers: Sit to/from Stand Sit to Stand: Min assist           General transfer comment: Assist for power up to full stand. VC's for hand placement on seated surface for safety. From seat on rollator pt had difficulty pushing up from arm rests and allowed hands on RW to stand from there.    Ambulation/Gait Ambulation/Gait assistance: Contact guard assist, Min assist Gait Distance (Feet): 50 Feet (total) Assistive device: Rolling walker (2 wheels), Rollator (4 wheels) Gait Pattern/deviations: Decreased stride length, Decreased weight shift to right, Decreased dorsiflexion - right, Decreased stance time - right, Decreased step length - right, Shuffle Gait velocity: Decreased Gait velocity interpretation: <1.31 ft/sec, indicative of household ambulator   General Gait Details: Difficulty advancing RLE due to baseline deficits. Pt able to make corrective changes with cue but not able to maintain. Attempted with rollator per pt/family request however with increased difficulty managing rollator position and maintaining upright posture.  Stairs Stairs: Yes Stairs assistance: Min assist Stair Management: Two rails, Step to pattern, Forwards Number of Stairs: 1 (x3) General stair comments: VC's for sequencing and general safety.  Wheelchair Mobility     Tilt Bed    Modified Rankin (Stroke Patients Only)       Balance Overall balance assessment: Needs assistance Sitting-balance support: Feet supported, No upper extremity supported Sitting balance-Leahy Scale: Fair Sitting balance - Comments: fatigues quickly into posterior lean Postural  control: Posterior  lean Standing balance support: No upper extremity supported, Reliant on assistive device for balance, Bilateral upper extremity supported Standing balance-Leahy Scale: Poor                               Pertinent Vitals/Pain Pain Assessment Pain Assessment: Faces Faces Pain Scale: Hurts even more Pain Location: back Pain Descriptors / Indicators: Operative site guarding, Sore Pain Intervention(s): Limited activity within patient's tolerance, Monitored during session, Repositioned    Home Living Family/patient expects to be discharged to:: Private residence Living Arrangements: Spouse/significant other Available Help at Discharge: Family;Available 24 hours/day Type of Home: House Home Access: Stairs to enter Entrance Stairs-Rails: Right;Left;Can reach both Entrance Stairs-Number of Steps: 1 down no rail, 2 up 2 rails, 1 into house (hold onto door frame) Alternate Level Stairs-Number of Steps: 13 bilat rails to man cave in basement, has main level bedroom/bathroom or upstairs bedroom with 13 steps Home Layout: Multi-level;Able to live on main level with bedroom/bathroom Home Equipment: Rolling Walker (2 wheels);Cane - single point;Shower seat;BSC/3in1      Prior Function Prior Level of Function : Independent/Modified Independent                     Extremity/Trunk Assessment   Upper Extremity Assessment Upper Extremity Assessment: Overall WFL for tasks assessed    Lower Extremity Assessment Lower Extremity Assessment: Generalized weakness;RLE deficits/detail RLE Deficits / Details: Residual deficits consistent with prior stroke    Cervical / Trunk Assessment Cervical / Trunk Assessment: Back Surgery  Communication   Communication Communication: No apparent difficulties    Cognition Arousal: Alert Behavior During Therapy: WFL for tasks assessed/performed   PT - Cognitive impairments: No apparent impairments                          Following commands: Intact       Cueing Cueing Techniques: Verbal cues, Gestural cues     General Comments      Exercises     Assessment/Plan    PT Assessment Patient needs continued PT services  PT Problem List Decreased strength;Decreased activity tolerance;Decreased balance;Decreased mobility;Decreased knowledge of use of DME;Decreased safety awareness;Decreased knowledge of precautions;Pain       PT Treatment Interventions DME instruction;Gait training;Stair training;Functional mobility training;Therapeutic activities;Therapeutic exercise;Balance training;Patient/family education    PT Goals (Current goals can be found in the Care Plan section)  Acute Rehab PT Goals Patient Stated Goal: Home today PT Goal Formulation: With patient/family Time For Goal Achievement: 11/28/23 Potential to Achieve Goals: Good    Frequency Min 5X/week     Co-evaluation               AM-PAC PT "6 Clicks" Mobility  Outcome Measure Help needed turning from your back to your side while in a flat bed without using bedrails?: A Little Help needed moving from lying on your back to sitting on the side of a flat bed without using bedrails?: A Little Help needed moving to and from a bed to a chair (including a wheelchair)?: A Little Help needed standing up from a chair using your arms (e.g., wheelchair or bedside chair)?: A Little Help needed to walk in hospital room?: A Little Help needed climbing 3-5 steps with a railing? : A Little 6 Click Score: 18    End of Session Equipment Utilized During Treatment: Gait belt;Back brace Activity Tolerance: Patient tolerated  treatment well Patient left: in chair;with call bell/phone within reach;with family/visitor present Nurse Communication: Mobility status PT Visit Diagnosis: Unsteadiness on feet (R26.81);Pain Pain - part of body:  (back)    Time: 1610-9604 PT Time Calculation (min) (ACUTE ONLY): 39 min   Charges:   PT Evaluation $PT  Eval Low Complexity: 1 Low PT Treatments $Gait Training: 8-22 mins PT General Charges $$ ACUTE PT VISIT: 1 Visit         Simone Dubois, PT, DPT Acute Rehabilitation Services Secure Chat Preferred Office: [phone]   Venus Ginsberg 11/21/2023, 11:02 AM

## 2023-11-22 ENCOUNTER — Telehealth: Payer: Self-pay | Admitting: *Deleted

## 2023-11-22 NOTE — Transitions of Care (Post Inpatient/ED Visit) (Signed)
 11/22/2023  Name: [name] MRN: [id number] DOB: 25-May-1941  Today's TOC FU Call Status: Today's TOC FU Call Status:: Successful TOC FU Call Completed TOC FU Call Complete Date: 11/22/23 Patient's Name and Date of Birth confirmed.  Transition Care Management Follow-up Telephone Call Date of Discharge: 11/21/23 Discharge Facility: [facility]) Type of Discharge: Inpatient Admission Primary Inpatient Discharge Diagnosis:: Lumbar compression fracture How have you been since you were released from the hospital?: Better (eating, drinking well, no issues with bowel/ bladder, ambulating without difficulty, taking medications as prescribed, has assistance at home) Any questions or concerns?: No  Items Reviewed: Did you receive and understand the discharge instructions provided?: Yes Medications obtained,verified, and reconciled?: Yes (Medications Reviewed) Any new allergies since your discharge?: No Dietary orders reviewed?: Yes Type of Diet Ordered:: heart healthy Do you have support at home?: Yes People in Home [RPT]: spouse Name of Support/Comfort Primary Source: spouse ,   states daughter also assists, has supportive family Reviewed signs/ symptoms of infection, reportable signs/ symptoms Reviewed safety precautions  Medications Reviewed Today: Medications Reviewed Today     Reviewed by Daralyn Earl, RN (Registered Nurse) on 11/22/23 at 1150  Med List Status: <None>   Medication Order Taking? Sig Documenting Provider Last Dose Status Informant  acetaminophen -codeine (TYLENOL  #2) 300-15 MG tablet [id number] No Take 1 tablet by mouth every 6 (six) hours as needed for severe pain (pain score 7-10).  Patient not taking: Reported on 11/22/2023   [provider] Not Taking Active Self  albuterol  (PROVENTIL ) (2.5 MG/3ML) 0.083% nebulizer solution [id number] Yes USE 1 VIAL IN NEBULIZER EVERY 6 HOURS AS NEEDED FOR WHEEZING FOR SHORTNESS OF BREATH Rakes, Georgeann Kindred, FNP Taking  Active Self  albuterol  (VENTOLIN  HFA) 108 (90 Base) MCG/ACT inhaler [id number] Yes Inhale 2 puffs into the lungs every 6 (six) hours as needed for wheezing or shortness of breath. Galvin Jules, FNP Taking Active Self  aspirin EC 81 MG tablet [id number] Yes Take 81 mg by mouth at bedtime. Swallow whole. [provider] Taking Active Self  budeson-glycopyrrolate-formoterol  (BREZTRI  AEROSPHERE) 160-9-4.8 MCG/ACT AERO inhaler [id number] Yes Inhale 2 puffs into the lungs in the morning and at bedtime. Galvin Jules, FNP Taking Active Self  cyanocobalamin  (VITAMIN B12) 1000 MCG/ML injection [id number] Yes Inject 1,000 mcg as directed every 30 (thirty) days. [provider] Taking Active Self  cyanocobalamin  (VITAMIN B12) injection 1,000 mcg [id number]   Galvin Jules, FNP  Active   enoxaparin  (LOVENOX ) 120 MG/0.8ML injection [id number] Yes Inject 0.8 mLs (120 mg total) into the skin daily. Laurann Pollock, MD Taking Active   Evolocumab  (REPATHA  SURECLICK) 140 MG/ML [place] [id number] Yes Inject 140 mg into the skin every 14 (fourteen) days. Laurann Pollock, MD Taking Active   guaiFENesin (MUCINEX) 600 MG 12 hr tablet [id number] No Take 600 mg by mouth daily.  Patient not taking: Reported on 11/22/2023   [provider] Not Taking Active Self  HYDROcodone-acetaminophen  (NORCO/VICODIN) 5-325 MG tablet [id number] Yes Take 1 tablet by mouth every 4 (four) hours as needed for moderate pain (pain score 4-6). Audie Bleacher, MD Taking Active   ketorolac  (ACULAR ) 0.5 % ophthalmic solution [id number] Yes PLACE 1 DROP INTO THE LEFT EYE 4 TIMES DAILY.  Patient taking differently: Place 1 drop into the left eye daily as needed (seasonal allergy).   Lorel Roes, NP Taking Active Self  metoprolol  tartrate (LOPRESSOR ) 25 MG tablet [id number] Yes Take 1 tablet (25 mg total) by mouth  2 (two) times daily. Galvin Jules, FNP Taking Active Self  omeprazole  (PRILOSEC) 20 MG capsule [id number] Yes Take  20 mg by mouth daily as needed (heartburn). [provider] Taking Active Self  tiZANidine (ZANAFLEX) 4 MG tablet [id number] Yes Take 1 tablet (4 mg total) by mouth every 8 (eight) hours as needed for muscle spasms. Audie Bleacher, MD Taking Active   warfarin (COUMADIN ) 5 MG tablet [id number] Yes TAKE 1/2 TO 1 TABLET BY MOUTH ONCE DAILY AS DIRECTED BY COUMADIN  CLINIC [place], Joyceann No, MD Taking Active   Med List Note Zorita Hiss, [place] 09/19/21 1028): Prescription assistance for Repatha .            Home Care and Equipment/Supplies: Were Home Health Services Ordered?: Yes [facility] Has Agency set up a time to come to your home?:  (home health is at patient's house now) First Home Health Visit Date: 11/22/23 Any new equipment or medical supplies ordered?: No  Functional Questionnaire: Do you need assistance with bathing/showering or dressing?: Yes (shower chair, family assists) Do you need assistance with meal preparation?: Yes (family assists) Do you need assistance with eating?: No Do you have difficulty maintaining continence: No Do you need assistance with getting out of bed/getting out of a chair/moving?: No (pt states he has a walker but has not needed it) Do you have difficulty managing or taking your medications?: No  Follow up appointments reviewed: PCP Follow-up appointment confirmed?: No (pt declined for RN CM to scheduled appointment, states he will see surgeon, not interested in making primary care provider appointment at present) MD Provider Line Number:[phone] Given: No Specialist Hospital Follow-up appointment confirmed?: Yes Date of Specialist follow-up appointment?: 12/09/23 Follow-Up Specialty Provider:: neurosurgeon  Audie Bleacher      Coumadin  /  INR check  6/10 @ 945 am Do you need transportation to your follow-up appointment?: No Do you understand care options if your condition(s) worsen?: Yes-patient verbalized  understanding  SDOH Interventions Today    Flowsheet Row Most Recent Value  SDOH Interventions   Food Insecurity Interventions Intervention Not Indicated  Housing Interventions Intervention Not Indicated  Transportation Interventions Intervention Not Indicated  Utilities Interventions Intervention Not Indicated       Cecilie Coffee [facility], BSN RN Care Manager/ Transition of Care [place]/ [facility] [phone]

## 2023-11-26 ENCOUNTER — Ambulatory Visit: Attending: Cardiology | Admitting: *Deleted

## 2023-11-26 DIAGNOSIS — Z5181 Encounter for therapeutic drug level monitoring: Secondary | ICD-10-CM | POA: Diagnosis not present

## 2023-11-26 DIAGNOSIS — I4891 Unspecified atrial fibrillation: Secondary | ICD-10-CM

## 2023-11-26 LAB — POCT INR: INR: 1.2 — AB (ref 2.0–3.0)

## 2023-11-26 MED ORDER — ENOXAPARIN SODIUM 120 MG/0.8ML IJ SOSY: 120.0000 mg | PREFILLED_SYRINGE | INTRAMUSCULAR | 0 refills | Status: DC

## 2023-11-26 NOTE — Patient Instructions (Addendum)
 6/10 - 6/18  Continue Lovenox  120mg  sq at 7am and continue warfarin 1 tablet daily except 1/2 tablet on S,T,Th. Recheck INR on 12/04/23

## 2023-12-03 ENCOUNTER — Inpatient Hospital Stay: Admitting: Family Medicine

## 2023-12-04 ENCOUNTER — Ambulatory Visit: Attending: Cardiology

## 2023-12-04 DIAGNOSIS — Z5181 Encounter for therapeutic drug level monitoring: Secondary | ICD-10-CM | POA: Diagnosis not present

## 2023-12-04 DIAGNOSIS — I4891 Unspecified atrial fibrillation: Secondary | ICD-10-CM

## 2023-12-04 LAB — POCT INR: INR: 3.8 — AB (ref 2.0–3.0)

## 2023-12-04 NOTE — Patient Instructions (Signed)
 Description   Stop the Lovenox  injections.  Skip today's dosage of Warfarin, then resume 1 tablet daily except 1/2 tablet Sundays, Tuesdays and Thursdays.  Recheck in 2 weeks

## 2023-12-07 ENCOUNTER — Other Ambulatory Visit: Payer: Self-pay | Admitting: Internal Medicine

## 2023-12-07 ENCOUNTER — Encounter (HOSPITAL_COMMUNITY): Payer: Self-pay

## 2023-12-07 NOTE — Progress Notes (Signed)
  Transfer Note   [name] FMW:[id number] DOB: 31-Dec-1940 DOA: (Not on file)  PCP: Severa Rock CHRISTELLA, FNP   Dr. Vaughn requesting transfer for services not available (neurosurgery) at their facility [facility])  Chief Complaint: Weakness, acute onset  HPI: [name] is a 84 y.o. male with medical history significant of COPD, CVA, hyperlipidemia, BPH, A-fib on Coumadin  who presents to outpatient facility after 48 hours of acute onset weakness ambulatory dysfunction and falls noted to be hypotensive at intake.  Septic workup underway given leukocytosis of 20 with tachycardia and tachypnea without clear etiology at this time but started on broad-spectrum antibiotics.  Patient recently discharged from our facility after uncomplicated percutaneous ORIF fixation of L4 with bilateral screws at L3, L5, and the left L4 with Dr. Gillie on 11/20/2023.  Given patient's recent procedure here, concern over acute weakness falls and possible infection with negative imaging and workup at outside facility we have requested transfer to our facility for neurosurgery evaluation which is certainly reasonable. Will consult neurosurgery once patient has arrived to our facility.  Notable labs and imaging at outside facility: CT head neck and lumbar spine without contrast -no acute process, previous surgical intervention noted on lumbar spine Chest x-ray without acute findings 2 L nasal cannula required for maintaining oxygen  95%(appears he is on room air at baseline) Leukocytosis 20 with notable shift Hemoglobin 9 Lactic acid 1.2 COVID/flu/RSV swab negative UA currently pending  Sean JAYSON Montclair DO [facility] For contact please use secure messenger on Epic  If 7PM-7AM, please contact night-coverage located on [email or web address] 12/07/2023, 11:51 AM

## 2023-12-08 ENCOUNTER — Inpatient Hospital Stay (HOSPITAL_COMMUNITY)

## 2023-12-08 ENCOUNTER — Other Ambulatory Visit: Payer: Self-pay

## 2023-12-08 ENCOUNTER — Inpatient Hospital Stay (HOSPITAL_COMMUNITY)
Admission: EM | Admit: 2023-12-08 | Discharge: 2023-12-17 | DRG: 871 | Disposition: E | Source: Other Acute Inpatient Hospital | Attending: Internal Medicine | Admitting: Internal Medicine

## 2023-12-08 ENCOUNTER — Encounter (HOSPITAL_COMMUNITY): Payer: Self-pay | Admitting: Internal Medicine

## 2023-12-08 DIAGNOSIS — N400 Enlarged prostate without lower urinary tract symptoms: Secondary | ICD-10-CM | POA: Diagnosis present

## 2023-12-08 DIAGNOSIS — J449 Chronic obstructive pulmonary disease, unspecified: Secondary | ICD-10-CM | POA: Diagnosis not present

## 2023-12-08 DIAGNOSIS — J440 Chronic obstructive pulmonary disease with acute lower respiratory infection: Secondary | ICD-10-CM | POA: Diagnosis present

## 2023-12-08 DIAGNOSIS — Z66 Do not resuscitate: Secondary | ICD-10-CM | POA: Diagnosis present

## 2023-12-08 DIAGNOSIS — R1310 Dysphagia, unspecified: Secondary | ICD-10-CM | POA: Diagnosis present

## 2023-12-08 DIAGNOSIS — S37019A Minor contusion of unspecified kidney, initial encounter: Secondary | ICD-10-CM | POA: Diagnosis not present

## 2023-12-08 DIAGNOSIS — D62 Acute posthemorrhagic anemia: Secondary | ICD-10-CM | POA: Diagnosis present

## 2023-12-08 DIAGNOSIS — Z886 Allergy status to analgesic agent status: Secondary | ICD-10-CM

## 2023-12-08 DIAGNOSIS — I2510 Atherosclerotic heart disease of native coronary artery without angina pectoris: Secondary | ICD-10-CM | POA: Diagnosis present

## 2023-12-08 DIAGNOSIS — I959 Hypotension, unspecified: Secondary | ICD-10-CM | POA: Diagnosis present

## 2023-12-08 DIAGNOSIS — I4891 Unspecified atrial fibrillation: Secondary | ICD-10-CM | POA: Diagnosis not present

## 2023-12-08 DIAGNOSIS — E538 Deficiency of other specified B group vitamins: Secondary | ICD-10-CM | POA: Diagnosis present

## 2023-12-08 DIAGNOSIS — A419 Sepsis, unspecified organism: Secondary | ICD-10-CM | POA: Diagnosis present

## 2023-12-08 DIAGNOSIS — Z7901 Long term (current) use of anticoagulants: Secondary | ICD-10-CM | POA: Diagnosis not present

## 2023-12-08 DIAGNOSIS — Z9079 Acquired absence of other genital organ(s): Secondary | ICD-10-CM

## 2023-12-08 DIAGNOSIS — I10 Essential (primary) hypertension: Secondary | ICD-10-CM | POA: Diagnosis present

## 2023-12-08 DIAGNOSIS — D649 Anemia, unspecified: Secondary | ICD-10-CM | POA: Insufficient documentation

## 2023-12-08 DIAGNOSIS — R6520 Severe sepsis without septic shock: Secondary | ICD-10-CM | POA: Diagnosis present

## 2023-12-08 DIAGNOSIS — J9601 Acute respiratory failure with hypoxia: Secondary | ICD-10-CM | POA: Diagnosis present

## 2023-12-08 DIAGNOSIS — E559 Vitamin D deficiency, unspecified: Secondary | ICD-10-CM | POA: Diagnosis present

## 2023-12-08 DIAGNOSIS — Z7951 Long term (current) use of inhaled steroids: Secondary | ICD-10-CM

## 2023-12-08 DIAGNOSIS — S32040A Wedge compression fracture of fourth lumbar vertebra, initial encounter for closed fracture: Secondary | ICD-10-CM

## 2023-12-08 DIAGNOSIS — Z8249 Family history of ischemic heart disease and other diseases of the circulatory system: Secondary | ICD-10-CM

## 2023-12-08 DIAGNOSIS — R627 Adult failure to thrive: Secondary | ICD-10-CM | POA: Diagnosis present

## 2023-12-08 DIAGNOSIS — S32041A Stable burst fracture of fourth lumbar vertebra, initial encounter for closed fracture: Secondary | ICD-10-CM | POA: Diagnosis present

## 2023-12-08 DIAGNOSIS — Z515 Encounter for palliative care: Secondary | ICD-10-CM | POA: Diagnosis not present

## 2023-12-08 DIAGNOSIS — I4819 Other persistent atrial fibrillation: Secondary | ICD-10-CM | POA: Diagnosis present

## 2023-12-08 DIAGNOSIS — J690 Pneumonitis due to inhalation of food and vomit: Secondary | ICD-10-CM | POA: Diagnosis present

## 2023-12-08 DIAGNOSIS — J189 Pneumonia, unspecified organism: Secondary | ICD-10-CM | POA: Diagnosis present

## 2023-12-08 DIAGNOSIS — N179 Acute kidney failure, unspecified: Secondary | ICD-10-CM | POA: Diagnosis present

## 2023-12-08 DIAGNOSIS — Z7982 Long term (current) use of aspirin: Secondary | ICD-10-CM

## 2023-12-08 DIAGNOSIS — R531 Weakness: Secondary | ICD-10-CM | POA: Diagnosis present

## 2023-12-08 DIAGNOSIS — Z6827 Body mass index (BMI) 27.0-27.9, adult: Secondary | ICD-10-CM

## 2023-12-08 DIAGNOSIS — Z83438 Family history of other disorder of lipoprotein metabolism and other lipidemia: Secondary | ICD-10-CM

## 2023-12-08 DIAGNOSIS — Z885 Allergy status to narcotic agent status: Secondary | ICD-10-CM

## 2023-12-08 DIAGNOSIS — N182 Chronic kidney disease, stage 2 (mild): Secondary | ICD-10-CM | POA: Diagnosis present

## 2023-12-08 DIAGNOSIS — E785 Hyperlipidemia, unspecified: Secondary | ICD-10-CM | POA: Diagnosis present

## 2023-12-08 DIAGNOSIS — S32000A Wedge compression fracture of unspecified lumbar vertebra, initial encounter for closed fracture: Secondary | ICD-10-CM | POA: Diagnosis present

## 2023-12-08 DIAGNOSIS — R262 Difficulty in walking, not elsewhere classified: Secondary | ICD-10-CM | POA: Diagnosis present

## 2023-12-08 DIAGNOSIS — Z888 Allergy status to other drugs, medicaments and biological substances status: Secondary | ICD-10-CM

## 2023-12-08 DIAGNOSIS — S37031A Laceration of right kidney, unspecified degree, initial encounter: Secondary | ICD-10-CM | POA: Diagnosis present

## 2023-12-08 DIAGNOSIS — E782 Mixed hyperlipidemia: Secondary | ICD-10-CM

## 2023-12-08 DIAGNOSIS — Z87891 Personal history of nicotine dependence: Secondary | ICD-10-CM

## 2023-12-08 DIAGNOSIS — R110 Nausea: Secondary | ICD-10-CM | POA: Diagnosis not present

## 2023-12-08 DIAGNOSIS — Z86718 Personal history of other venous thrombosis and embolism: Secondary | ICD-10-CM

## 2023-12-08 DIAGNOSIS — K219 Gastro-esophageal reflux disease without esophagitis: Secondary | ICD-10-CM | POA: Diagnosis present

## 2023-12-08 DIAGNOSIS — Z8673 Personal history of transient ischemic attack (TIA), and cerebral infarction without residual deficits: Secondary | ICD-10-CM

## 2023-12-08 DIAGNOSIS — I129 Hypertensive chronic kidney disease with stage 1 through stage 4 chronic kidney disease, or unspecified chronic kidney disease: Secondary | ICD-10-CM | POA: Diagnosis present

## 2023-12-08 DIAGNOSIS — Z7189 Other specified counseling: Secondary | ICD-10-CM | POA: Diagnosis not present

## 2023-12-08 DIAGNOSIS — G4730 Sleep apnea, unspecified: Secondary | ICD-10-CM | POA: Diagnosis present

## 2023-12-08 DIAGNOSIS — Z85828 Personal history of other malignant neoplasm of skin: Secondary | ICD-10-CM

## 2023-12-08 LAB — CBC
HCT: 30.1 % — ABNORMAL LOW (ref 39.0–52.0)
Hemoglobin: 9.4 g/dL — ABNORMAL LOW (ref 13.0–17.0)
MCH: 29.8 pg (ref 26.0–34.0)
MCHC: 31.2 g/dL (ref 30.0–36.0)
MCV: 95.6 fL (ref 80.0–100.0)
Platelets: 363 10*3/uL (ref 150–400)
RBC: 3.15 MIL/uL — ABNORMAL LOW (ref 4.22–5.81)
RDW: 12.9 % (ref 11.5–15.5)
WBC: 13.5 10*3/uL — ABNORMAL HIGH (ref 4.0–10.5)
nRBC: 0 % (ref 0.0–0.2)

## 2023-12-08 LAB — BASIC METABOLIC PANEL WITH GFR
Anion gap: 10 (ref 5–15)
BUN: 24 mg/dL — ABNORMAL HIGH (ref 8–23)
CO2: 24 mmol/L (ref 22–32)
Calcium: 8.8 mg/dL — ABNORMAL LOW (ref 8.9–10.3)
Chloride: 101 mmol/L (ref 98–111)
Creatinine, Ser: 1.48 mg/dL — ABNORMAL HIGH (ref 0.61–1.24)
GFR, Estimated: 46 mL/min — ABNORMAL LOW (ref >60)
Glucose, Bld: 136 mg/dL — ABNORMAL HIGH (ref 70–99)
Potassium: 3.8 mmol/L (ref 3.5–5.1)
Sodium: 135 mmol/L (ref 135–145)

## 2023-12-08 LAB — PROTIME-INR
INR: 2.4 — ABNORMAL HIGH (ref 0.8–1.2)
Prothrombin Time: 26.4 s — ABNORMAL HIGH (ref 11.4–15.2)

## 2023-12-08 LAB — TYPE AND SCREEN
ABO/RH(D): A NEG
Antibody Screen: NEGATIVE

## 2023-12-08 LAB — MAGNESIUM: Magnesium: 2 mg/dL (ref 1.7–2.4)

## 2023-12-08 MED ORDER — WARFARIN SODIUM 2.5 MG PO TABS
2.5000 mg | ORAL_TABLET | Freq: Once | ORAL | Status: DC
  Filled 2023-12-08: qty 1

## 2023-12-08 MED ORDER — VANCOMYCIN HCL IN DEXTROSE 1-5 GM/200ML-% IV SOLN
1000.0000 mg | INTRAVENOUS | Status: DC
  Filled 2023-12-08: qty 200

## 2023-12-08 MED ORDER — ACETAMINOPHEN 650 MG RE SUPP: 650.0000 mg | Freq: Four times a day (QID) | RECTAL | Status: DC | PRN

## 2023-12-08 MED ORDER — ACETAMINOPHEN 325 MG PO TABS
650.0000 mg | ORAL_TABLET | Freq: Four times a day (QID) | ORAL | Status: DC | PRN
Start: 2023-12-08 — End: 2023-12-15
  Administered 2023-12-11 – 2023-12-13 (×2): 650 mg via ORAL
  Filled 2023-12-08 (×2): qty 2

## 2023-12-08 MED ORDER — IPRATROPIUM BROMIDE 0.02 % IN SOLN
0.5000 mg | Freq: Once | RESPIRATORY_TRACT | Status: AC
  Administered 2023-12-08: 0.5 mg via RESPIRATORY_TRACT
  Filled 2023-12-08: qty 2.5

## 2023-12-08 MED ORDER — IOHEXOL 350 MG/ML SOLN
75.0000 mL | Freq: Once | INTRAVENOUS | Status: AC | PRN
  Administered 2023-12-08: 75 mL via INTRAVENOUS

## 2023-12-08 MED ORDER — ALBUTEROL SULFATE (2.5 MG/3ML) 0.083% IN NEBU
2.5000 mg | INHALATION_SOLUTION | RESPIRATORY_TRACT | Status: DC | PRN
  Administered 2023-12-11: 2.5 mg via RESPIRATORY_TRACT
  Filled 2023-12-08 (×2): qty 3

## 2023-12-08 MED ORDER — SODIUM CHLORIDE 0.9 % IV SOLN
2.0000 g | Freq: Two times a day (BID) | INTRAVENOUS | Status: DC
  Administered 2023-12-08 – 2023-12-13 (×10): 2 g via INTRAVENOUS
  Filled 2023-12-08 (×11): qty 12.5

## 2023-12-08 MED ORDER — ONDANSETRON HCL 4 MG/2ML IJ SOLN
4.0000 mg | Freq: Once | INTRAMUSCULAR | Status: AC
  Administered 2023-12-08: 4 mg via INTRAVENOUS
  Filled 2023-12-08: qty 2

## 2023-12-08 MED ORDER — PANTOPRAZOLE SODIUM 40 MG PO TBEC
40.0000 mg | DELAYED_RELEASE_TABLET | Freq: Every day | ORAL | Status: DC
  Administered 2023-12-09 – 2023-12-11 (×3): 40 mg via ORAL
  Filled 2023-12-08 (×3): qty 1

## 2023-12-08 MED ORDER — METOPROLOL TARTRATE 25 MG PO TABS
25.0000 mg | ORAL_TABLET | Freq: Two times a day (BID) | ORAL | Status: DC
  Administered 2023-12-09 – 2023-12-12 (×7): 25 mg via ORAL
  Filled 2023-12-08 (×7): qty 1

## 2023-12-08 MED ORDER — WARFARIN - PHARMACIST DOSING INPATIENT: Freq: Every day | Status: DC

## 2023-12-08 MED ORDER — POLYETHYLENE GLYCOL 3350 17 G PO PACK
17.0000 g | PACK | Freq: Every day | ORAL | Status: DC | PRN
Start: 2023-12-08 — End: 2023-12-15

## 2023-12-08 MED ORDER — BUDESON-GLYCOPYRROL-FORMOTEROL 160-9-4.8 MCG/ACT IN AERO
2.0000 | INHALATION_SPRAY | Freq: Two times a day (BID) | RESPIRATORY_TRACT | Status: DC
  Administered 2023-12-08 – 2023-12-11 (×6): 2 via RESPIRATORY_TRACT
  Filled 2023-12-08: qty 5.9

## 2023-12-08 MED ORDER — LACTATED RINGERS IV SOLN: INTRAVENOUS | Status: DC

## 2023-12-08 MED ORDER — METOPROLOL TARTRATE 5 MG/5ML IV SOLN
2.5000 mg | Freq: Once | INTRAVENOUS | Status: AC
  Administered 2023-12-08: 2.5 mg via INTRAVENOUS
  Filled 2023-12-08: qty 5

## 2023-12-08 MED ORDER — SODIUM CHLORIDE 0.9% FLUSH
3.0000 mL | Freq: Two times a day (BID) | INTRAVENOUS | Status: DC
  Administered 2023-12-08 – 2023-12-14 (×12): 3 mL via INTRAVENOUS

## 2023-12-08 MED ORDER — LACTATED RINGERS IV BOLUS: 250.0000 mL | Freq: Once | INTRAVENOUS | Status: DC

## 2023-12-08 MED ORDER — GADOBUTROL 1 MMOL/ML IV SOLN
7.0000 mL | Freq: Once | INTRAVENOUS | Status: AC | PRN
  Administered 2023-12-08: 7 mL via INTRAVENOUS

## 2023-12-08 MED ORDER — SODIUM CHLORIDE 0.9 % IV BOLUS
500.0000 mL | Freq: Once | INTRAVENOUS | Status: AC
  Administered 2023-12-08: 500 mL via INTRAVENOUS

## 2023-12-08 MED ORDER — ASPIRIN 81 MG PO TBEC: 81.0000 mg | DELAYED_RELEASE_TABLET | Freq: Every day | ORAL | Status: DC

## 2023-12-08 MED ORDER — HYDROCODONE-ACETAMINOPHEN 5-325 MG PO TABS
1.0000 | ORAL_TABLET | ORAL | Status: DC | PRN
  Administered 2023-12-08 – 2023-12-13 (×8): 1 via ORAL
  Filled 2023-12-08 (×9): qty 1

## 2023-12-08 NOTE — Consult Note (Addendum)
 I have been asked to see the patient by Dr. Marsa Bair, for evaluation and management of right perinephric hematoma.  History of present illness: 83 year old male with history of hypertension, HLD, GERD, CKD 2, coronary artery disease, BPH, atrial fibrillation, COPD, lumbar fracture, DVT 2022, and recent TIA in November who initially presented to the ER for concerns of sepsis CT done of the chest demonstrates a right perinephric hematoma is also visualized on the MRI of the spine.  Patient additionally presented to OSH where hemoglobin had fallen from 14 to 9 he also noted to be tachycardic but appears to be in A-fib RVR at this time.  Patient was initially hypotensive but responded to fluids immediately.  Hemoglobin also remained stable at 9.  Per discussion with patient he had a fall about 2 days ago where he landed on his back which he thinks he fell on his right side.  He now is having diffuse pain.  He denies any nausea vomiting fevers or chills.  He denies any gross hematuria denies any difficulties with urination.  Patient is on warfarin for atrial fibrillation and DVT  Review of CT chest shows a right perinephric hematoma study was noncontrasted unable to see blush.  MRI demonstrates this also.  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  Review of systems: A 12 point comprehensive review of systems was obtained and is negative unless otherwise stated in the history of present illness.  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   Meibomian gland dysfunction (MGD) of both eyes 05/08/2020   Cicatricial lagophthalmos of left upper eyelid 03/22/2020   Salzmann's nodular dystrophy of left eye 03/22/2020   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 03/22/2020   Vitamin D  insufficiency    Prediabetes 03/31/2018   BPH (benign prostatic hyperplasia) 02/07/2017   Chronic kidney disease (CKD), 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   Vertigo 02/07/2017   Chronic anticoagulation 02/27/2013   COPD GOLD 2 with reversibility  01/21/2013   Pulmonary nodules 12/19/2012   CAD (coronary artery disease)    HLD (hyperlipidemia) 11/01/2009   ATRIAL FIBRILLATION, CHRONIC 11/01/2009   GERD 11/01/2009    No current facility-administered medications on file prior to encounter.   Current Outpatient Medications on File Prior to Encounter  Medication Sig Dispense Refill   [Paused] acetaminophen -codeine (TYLENOL  #2) 300-15 MG tablet Take 1 tablet by mouth every 6 (six) hours as needed for severe pain (pain score 7-10). (Patient not taking: Reported on 11/22/2023)     albuterol  (PROVENTIL ) (2.5 MG/3ML) 0.083% nebulizer solution USE 1 VIAL IN NEBULIZER EVERY 6 HOURS AS NEEDED FOR WHEEZING FOR SHORTNESS OF BREATH 180 mL 11   albuterol  (VENTOLIN  HFA) 108 (90 Base) MCG/ACT inhaler Inhale 2 puffs into the lungs every 6 (six) hours as needed for wheezing or shortness of breath. 8 g 11   aspirin  EC 81 MG tablet  Take 81 mg by mouth at bedtime. Swallow whole.     budeson-glycopyrrolate -formoterol  (BREZTRI  AEROSPHERE) 160-9-4.8 MCG/ACT AERO inhaler Inhale 2 puffs into the lungs in the morning and at bedtime. 10.7 g 11   cyanocobalamin  (VITAMIN B12) 1000 MCG/ML injection Inject 1,000 mcg as directed every 30 (thirty) days.     enoxaparin  (LOVENOX ) 120 MG/0.8ML injection Inject 0.8 mLs (120 mg total)  into the skin daily. 8 mL 0   Evolocumab  (REPATHA  SURECLICK) 140 MG/ML SOAJ Inject 140 mg into the skin every 14 (fourteen) days. 6 mL 3   guaiFENesin  (MUCINEX ) 600 MG 12 hr tablet Take 600 mg by mouth daily. (Patient not taking: Reported on 11/22/2023)     HYDROcodone -acetaminophen  (NORCO/VICODIN) 5-325 MG tablet Take 1 tablet by mouth every 4 (four) hours as needed for moderate pain (pain score 4-6). 30 tablet 0   ketorolac  (ACULAR ) 0.5 % ophthalmic solution PLACE 1 DROP INTO THE LEFT EYE 4 TIMES DAILY. (Patient taking differently: Place 1 drop into the left eye daily as needed (seasonal allergy).) 25 mL 0   metoprolol  tartrate (LOPRESSOR ) 25 MG tablet Take 1 tablet (25 mg total) by mouth 2 (two) times daily. 180 tablet 1   omeprazole  (PRILOSEC) 20 MG capsule Take 20 mg by mouth daily as needed (heartburn).     tiZANidine  (ZANAFLEX ) 4 MG tablet Take 1 tablet (4 mg total) by mouth every 8 (eight) hours as needed for muscle spasms. 60 tablet 0   warfarin (COUMADIN ) 5 MG tablet TAKE 1/2 TO 1 TABLET BY MOUTH ONCE DAILY AS DIRECTED BY COUMADIN  CLINIC 90 tablet 1    Past Medical History:  Diagnosis Date   Anxiety disorder    Benign prostatic hypertrophy    Bilateral cataracts    CAD (coronary artery disease)    Catheterization 2009, mild CAD   Cancer (HCC)    skin ca on arms   Carotid artery disease (HCC)    Doppler, September, 2011, less than 50% bilateral   COPD (chronic obstructive pulmonary disease) (HCC)    CVA (cerebral infarction)    Hospitalization.  MRI...    Left anterior cerebral artery territory, September, 2011... Coumadin  started   DYSLIPIDEMIA 11/01/2009   Qualifier: Diagnosis of  By: Micky, MD, [facility], [place]    Ejection fraction    EF 60%   Ejection fraction    EF 55-60%, echo, September, 2011   Gastroesophageal reflux disease    History of deep venous thrombosis (DVT) of distal vein of left lower extremity 08/05/2020   History of left inguinal hernia     Hyperlipidemia    Persistent atrial fibrillation (HCC)        Pneumonia    x several   Ptosis of eyelid, left    Surgery in the past   Stroke (HCC) 02/2010   Sweating abnormality    Episodes of sweating appeared to be related to a delayed reaction to niacin   Vitamin D  insufficiency    Walker as ambulation aid     Past Surgical History:  Procedure Laterality Date   APPENDECTOMY     CATARACT EXTRACTION, BILATERAL     COLONOSCOPY     INGUINAL HERNIA REPAIR Left 07/02/2019   INGUINAL HERNIA REPAIR Left 07/01/2020   Procedure: LEFT INGUINAL HERNIA REPAIR WITH MESH;  Surgeon: Kallie Manuelita BROCKS, MD;  Location: [facility];  Service: General;  Laterality: Left;   KNEE SURGERY Right 2019   torn ligament?    LAMINECTOMY WITH POSTERIOR LATERAL ARTHRODESIS LEVEL 1  N/A 11/20/2023   Procedure: Open reduction internal fixation lumbar four ,percutaneous pedicle screw placement with cement augmentation;  Surgeon: Gillie Duncans, MD;  Location: [facility] OR;  Service: Neurosurgery;  Laterality: N/A;   TRANSURETHRAL RESECTION OF PROSTATE N/A 08/19/2018   Procedure: TRANSURETHRAL RESECTION OF THE PROSTATE (TURP) OF PROSTATE ABCESS;  Surgeon: Alvaro Hummer, MD;  Location: [facility];  Service: Urology;  Laterality: N/A;    Social History   Tobacco Use   Smoking status: Former    Current packs/day: 0.00    Average packs/day: 3.0 packs/day for 50.0 years (150.0 ttl pk-yrs)    Types: Cigarettes    Start date: 03/18/1940    Quit date: 03/18/1990    Years since quitting: 33.7    Passive exposure: Past   Smokeless tobacco: Former    Types: Chew    Quit date: 06/18/2000  Vaping Use   Vaping status: Never Used  Substance Use Topics   Alcohol use: No    Alcohol/week: 0.0 standard drinks of alcohol   Drug use: No    Family History  Problem Relation Age of Onset   Heart attack Mother    Other Other        Insignificant for premature CAD   Heart disease Sister    Hyperlipidemia Sister    Kidney disease Son     Heart attack Paternal Grandfather     PE: Vitals:   12/08/23 1522 12/08/23 1640 12/08/23 1726 12/08/23 2015  BP: (!) 150/91 (!) 160/71 (!) 160/90 128/83  Pulse: (!) 101   (!) 124  Resp: 20 20 20    Temp: 98.8 F (37.1 C) 98.9 F (37.2 C) 98.9 F (37.2 C) 98.1 F (36.7 C)  TempSrc: Axillary Axillary  Oral  SpO2: 92% 94% 100% 99%  Weight: 76.9 kg     Height: 5' 6 (1.676 m)      Patient appears to be in no acute distress  patient is alert and oriented x3 Respiratory: Normal work of breathing on nasal cannula Cardiovascular: Tachycardic  No results for input(s): WBC, HGB, HCT in the last 72 hours. No results for input(s): NA, K, CL, CO2, GLUCOSE, BUN, CREATININE, CALCIUM  in the last 72 hours. No results for input(s): LABPT, INR in the last 72 hours. No results for input(s): LABURIN in the last 72 hours. Results for orders placed or performed during the hospital encounter of 11/07/23  Surgical pcr screen     Status: None   Collection Time: 11/07/23 11:46 AM   Specimen: Nasal Mucosa; Nasal Swab  Result Value Ref Range Status   MRSA, PCR NEGATIVE NEGATIVE Final   Staphylococcus aureus NEGATIVE NEGATIVE Final    Comment: (NOTE) The Xpert SA Assay (FDA approved for NASAL specimens in patients 69 years of age and older), is one component of a comprehensive surveillance program. It is not intended to diagnose infection nor to guide or monitor treatment. Performed at [facility], [address], [place], [place] [address]     Imaging: CT chest demonstrates a perinephric hematoma surrounding the right kidney.  MRI also reflects this. CT from September 24, 2023 demonstrates a 1 cm lower pole AML.  Imp: 83 year old male with perinephric hematoma likely traumatic after fall likely resultant from traumatic injury rupturing the AML at the inferior pole of his right kidney in the setting of anticoagulation with warfarin.  This current injury was  complicated by COPD as well his atrial fibrillation and recent back surgery.  Recommendations: Perinephric hematoma - Stat  CBC Chem-7 - Stat CT abdomen pelvis with contrast - Hold warfarin - Hemoglobin stable continue to trend H&H's every 8 hours - keep NPO for next 24 hours  - Transfuse as needed - Recommend bedrest - If patient becomes unstable recommend stat IR consult for right kidney embolization.  - no plan urologic intervention   Atrial fibrillation - Currently in A-fib RVR - Pending hemoglobin address as needed  History of BPH: - Denies issues with urination currently - No intervention needed   Thank you for involving me in this patient's care, I will continue to follow along.Please page with any further questions or concerns. Steffan JAYSON Pea

## 2023-12-08 NOTE — Progress Notes (Signed)
 Brief Progress note:  Abnormal CT R Perinephric Hematoma Small Pleural effusions Anemia Anticoagulation > CT Chest showed hyperdense small effusion possibly hemorrhagic. Mor concerning was right sided perinephric findings concerning for hematoma. > Recent Hgb at [facility] stable at 9 yesterday and this AM. > Warfarin had been reordered but not yet given, will be D/C'd > MR lumbar spine showed renal findings as well and again rec CT Ab/pelvis w/ contrast. > Spoke with gen surg who recommend urology consultation with this presentation. > Spoke with urology who agreed with holding warfarin, trending labs, and CT. If isolated to kidney and Hgb stable no need for Warfarin reversal. Recommended bed rest as well. > Updated overnight covering provider about need to follow labs and CT, urology will look out for CT result as well. - STAT CT abd/pelvis w/ contrast - STAT CBC, PT/INR, Type and screen - Discontinue Warfarin, and warfarin per pharmacy consult - Bed rest - NPO pending CT results - IVF bolus around the time of CT for renal protection with improving AKI.

## 2023-12-08 NOTE — Plan of Care (Signed)
  Problem: Nutrition: Goal: Adequate nutrition will be maintained Outcome: Progressing   Problem: Coping: Goal: Level of anxiety will decrease Outcome: Progressing   Problem: Elimination: Goal: Will not experience complications related to bowel motility Outcome: Progressing Goal: Will not experience complications related to urinary retention Outcome: Progressing   Problem: Safety: Goal: Ability to remain free from injury will improve Outcome: Progressing   Problem: Education: Goal: Knowledge of General Education information will improve Description: Including pain rating scale, medication(s)/side effects and non-pharmacologic comfort measures Outcome: Not Progressing   Problem: Health Behavior/Discharge Planning: Goal: Ability to manage health-related needs will improve Outcome: Not Progressing   Problem: Clinical Measurements: Goal: Ability to maintain clinical measurements within normal limits will improve Outcome: Not Progressing Goal: Will remain free from infection Outcome: Not Progressing Goal: Diagnostic test results will improve Outcome: Not Progressing Goal: Respiratory complications will improve Outcome: Not Progressing Goal: Cardiovascular complication will be avoided Outcome: Not Progressing   Problem: Activity: Goal: Risk for activity intolerance will decrease Outcome: Not Progressing   Problem: Pain Managment: Goal: General experience of comfort will improve and/or be controlled Outcome: Not Progressing   Problem: Skin Integrity: Goal: Risk for impaired skin integrity will decrease Outcome: Not Progressing

## 2023-12-08 NOTE — Progress Notes (Signed)
 PHARMACY - ANTICOAGULATION CONSULT NOTE  Pharmacy Consult for Warfarin Indication: atrial fibrillation  Allergies  Allergen Reactions   Asa [Aspirin ]     GI upset   Niacin     hot flashes   Pravastatin      myalgias   Rosuvastatin      myalgias   Simvastatin      Myalgias    Tramadol  Other (See Comments)    hallucinations    Patient Measurements: Height: 5' 6 (167.6 cm) Weight: 76.9 kg (169 lb 8.5 oz) IBW/kg (Calculated) : 63.8 HEPARIN DW (KG): 76.9  Vital Signs: Temp: 98.9 F (37.2 C) (06/22 1640) Temp Source: Axillary (06/22 1640) BP: 160/71 (06/22 1640) Pulse Rate: 101 (06/22 1522)  Labs: No results for input(s): HGB, HCT, PLT, APTT, LABPROT, INR, HEPARINUNFRC, HEPRLOWMOCWT, CREATININE, CKTOTAL, CKMB, TROPONINIHS in the last 72 hours.  Estimated Creatinine Clearance: 47.9 mL/min (by C-G formula based on SCr of 1.1 mg/dL).   Medical History: Past Medical History:  Diagnosis Date   Anxiety disorder    Benign prostatic hypertrophy    Bilateral cataracts    CAD (coronary artery disease)    Catheterization 2009, mild CAD   Cancer (HCC)    skin ca on arms   Carotid artery disease (HCC)    Doppler, September, 2011, less than 50% bilateral   COPD (chronic obstructive pulmonary disease) (HCC)    CVA (cerebral infarction)    Hospitalization.  MRI...    Left anterior cerebral artery territory, September, 2011... Coumadin  started   DYSLIPIDEMIA 11/01/2009   Qualifier: Diagnosis of  By: Micky, MD, [facility], [place]    Ejection fraction    EF 60%   Ejection fraction    EF 55-60%, echo, September, 2011   Gastroesophageal reflux disease    History of deep venous thrombosis (DVT) of distal vein of left lower extremity 08/05/2020   History of left inguinal hernia    Hyperlipidemia    Persistent atrial fibrillation (HCC)        Pneumonia    x several   Ptosis of eyelid, left    Surgery in the past   Stroke (HCC) 02/2010   Sweating  abnormality    Episodes of sweating appeared to be related to a delayed reaction to niacin   Vitamin D  insufficiency    Walker as ambulation aid     Medications:  Scheduled:   aspirin  EC  81 mg Oral QHS   budesonide -glycopyrrolate -formoterol   2 puff Inhalation BID   metoprolol  tartrate  25 mg Oral BID   [START ON 12/09/2023] pantoprazole   40 mg Oral Daily   sodium chloride  flush  3 mL Intravenous Q12H   warfarin  2.5 mg Oral Once   [START ON 12/09/2023] Warfarin - Pharmacist Dosing Inpatient   Does not apply q1600   Infusions:   ceFEPime (MAXIPIME) IV     lactated ringers  100 mL/hr at 12/08/23 1654   [START ON 12/09/2023] vancomycin     PRN: acetaminophen  **OR** acetaminophen , albuterol , HYDROcodone -acetaminophen , polyethylene glycol  Assessment: 83 yo male on chronic warfarin therapy for AF.  Per [facility] clinic note, patient's warfarin dose is 1 tablet (5mg ) daily except 1/2 tablet Sundays, Tuesdays and Thursdays.   INR at [facility] today was 2.0  Goal of Therapy:  INR 2-3   Plan:  Warfarin 2.5mg  PO x 1 tonight Daily PT/INR  Rocky Slade, PharmD, BCPS 12/08/2023,5:19 PM

## 2023-12-08 NOTE — H&P (Addendum)
 History and Physical   [name] FMW:[id number] DOB: May 24, 1941 DOA: 12/08/2023  PCP: [name] CHRISTELLA, FNP   Patient coming from: [facility]  Chief Complaint: Weakness  HPI/Course: [name] is a 83 y.o. male with medical history significant of hypertension, hyperlipidemia, GERD, CKD 2, CAD, BPH, atrial fibrillation, COPD, lumbar fracture presenting from outside hospital after presenting there with weakness and concern for sepsis.  Patient initially presented yesterday, 6/21, with 2 days of increasing weakness and difficulty walking with some falls.  Also reportedly had some decreased p.o. intake and had slid out of his bed 2 days prior and was down for unknown period of time.  Patient has had recent neurosurgical intervention with ORIF from L3-L5 due to lumbar fracture.  This was on 6/4.  Patient was going to come back to [facility] for follow-up but was found to have hypotension and was on oxygen  so was transported to local ED instead.  There patient was found to have hypotension which responded to IV fluids, tachycardia, oxygen  requirement.  Lab workup there included CMP with sodium 135, BUN 31, creatinine 1.82 from baseline of 1.1, glucose 112, calcium  8.4, protein 5.6, AST 85.  CBC with leukocytosis to 20.6, hemoglobin of 9 down from 14.  PT and INR elevated at 7.2 and 1.7 with PTT of 34.7 in the setting of warfarin use.  Procalcitonin 1.24, respiratory panel for flu COVID and RSV negative.  Lactic acid 1.2.  Troponin flat at 51, 55.  Urinalysis normal with urine culture no growth x 1 day.  Blood culture no growth x 1 day.  CPK elevated at 1199.  Ammonia normal.  VBG today with pH 7.32 and normal pCO2.  Magnesium normal.  Chest x-ray showed no acute abnormality.  CT head showed no acute normality and stable chronic changes.  CT C-spine was motion limited but showed no acute abnormality.  CT L-spine showed prior lumbar fracture status post fixation but no acute  abnormality.  Patient received 2.2 L IV fluids, vancomycin, ceftriaxone , DuoNebs, morphine for pain, IV metoprolol  for tachycardia, Ativan.  Blood pressure has improved, remains mildly tachycardic and on oxygen .Had some hallucinations but has had this in the past with morphine.  Labs improved with creatinine improving from 1.82-1.45 still not at baseline of 1.1.  CBC showed WBC improved from 2214 and hemoglobin stable at 9.  Patient continued to receive antibiotics while awaiting bed here.  Patient denies fevers, chills, chest pain, shortness breath, abdominal pain, constipation, diarrhea, nausea, vomiting.  Review of Systems: As per HPI otherwise all other systems reviewed and are negative.  Past Medical History:  Diagnosis Date   Anxiety disorder    Benign prostatic hypertrophy    Bilateral cataracts    CAD (coronary artery disease)    Catheterization 2009, mild CAD   Cancer (HCC)    skin ca on arms   Carotid artery disease (HCC)    Doppler, September, 2011, less than 50% bilateral   COPD (chronic obstructive pulmonary disease) (HCC)    CVA (cerebral infarction)    Hospitalization.  MRI...    Left anterior cerebral artery territory, September, 2011... Coumadin  started   DYSLIPIDEMIA 11/01/2009   Qualifier: Diagnosis of  By: Micky, MD, CODY Reyes Lenis    Ejection fraction    EF 60%   Ejection fraction    EF 55-60%, echo, September, 2011   Gastroesophageal reflux disease    History of deep venous thrombosis (DVT) of distal vein of left lower extremity 08/05/2020  History of left inguinal hernia    Hyperlipidemia    Persistent atrial fibrillation (HCC)        Pneumonia    x several   Ptosis of eyelid, left    Surgery in the past   Stroke (HCC) 02/2010   Sweating abnormality    Episodes of sweating appeared to be related to a delayed reaction to niacin   Vitamin D  insufficiency    Walker as ambulation aid     Past Surgical History:  Procedure Laterality Date    APPENDECTOMY     CATARACT EXTRACTION, BILATERAL     COLONOSCOPY     INGUINAL HERNIA REPAIR Left 07/02/2019   INGUINAL HERNIA REPAIR Left 07/01/2020   Procedure: LEFT INGUINAL HERNIA REPAIR WITH MESH;  Surgeon: Kallie Manuelita BROCKS, MD;  Location: [facility];  Service: General;  Laterality: Left;   KNEE SURGERY Right 2019   torn ligament?    LAMINECTOMY WITH POSTERIOR LATERAL ARTHRODESIS LEVEL 1 N/A 11/20/2023   Procedure: Open reduction internal fixation lumbar four ,percutaneous pedicle screw placement with cement augmentation;  Surgeon: Gillie Duncans, MD;  Location: [facility] OR;  Service: Neurosurgery;  Laterality: N/A;   TRANSURETHRAL RESECTION OF PROSTATE N/A 08/19/2018   Procedure: TRANSURETHRAL RESECTION OF THE PROSTATE (TURP) OF PROSTATE ABCESS;  Surgeon: Alvaro Hummer, MD;  Location: [facility];  Service: Urology;  Laterality: N/A;    Social History  reports that he quit smoking about 33 years ago. His smoking use included cigarettes. He started smoking about 83 years ago. He has a 150 pack-year smoking history. He has been exposed to tobacco smoke. He quit smokeless tobacco use about 23 years ago.  His smokeless tobacco use included chew. He reports that he does not drink alcohol and does not use drugs.  Allergies  Allergen Reactions   Asa [Aspirin ]     GI upset   Niacin     hot flashes   Pravastatin      myalgias   Rosuvastatin      myalgias   Simvastatin      Myalgias    Tramadol  Other (See Comments)    hallucinations    Family History  Problem Relation Age of Onset   Heart attack Mother    Other Other        Insignificant for premature CAD   Heart disease Sister    Hyperlipidemia Sister    Kidney disease Son    Heart attack Paternal Grandfather   Reviewed on admission  Prior to Admission medications   Medication Sig Start Date End Date Taking? Authorizing Provider  acetaminophen -codeine (TYLENOL  #2) 300-15 MG tablet Take 1 tablet by mouth every 6 (six) hours as needed for  severe pain (pain score 7-10). Patient not taking: Reported on 11/22/2023 10/09/23   [provider]  albuterol  (PROVENTIL ) (2.5 MG/3ML) 0.083% nebulizer solution USE 1 VIAL IN NEBULIZER EVERY 6 HOURS AS NEEDED FOR WHEEZING FOR SHORTNESS OF BREATH 07/05/22   [name] HERO, FNP  albuterol  (VENTOLIN  HFA) 108 (90 Base) MCG/ACT inhaler Inhale 2 puffs into the lungs every 6 (six) hours as needed for wheezing or shortness of breath. 07/05/22   [name] HERO, FNP  aspirin  EC 81 MG tablet Take 81 mg by mouth at bedtime. Swallow whole.    [provider]  budeson-glycopyrrolate -formoterol  (BREZTRI  AEROSPHERE) 160-9-4.8 MCG/ACT AERO inhaler Inhale 2 puffs into the lungs in the morning and at bedtime. 10/22/23 03/22/24  [name] HERO, FNP  cyanocobalamin  (VITAMIN B12) 1000 MCG/ML injection Inject 1,000 mcg  as directed every 30 (thirty) days.    [provider]  enoxaparin  (LOVENOX ) 120 MG/0.8ML injection Inject 0.8 mLs (120 mg total) into the skin daily. 11/26/23   Alvan Dorn FALCON, MD  Evolocumab  (REPATHA  SURECLICK) 140 MG/ML SOAJ Inject 140 mg into the skin every 14 (fourteen) days. 11/13/23   Alvan Dorn FALCON, MD  guaiFENesin  (MUCINEX ) 600 MG 12 hr tablet Take 600 mg by mouth daily. Patient not taking: Reported on 11/22/2023    [provider]  HYDROcodone -acetaminophen  (NORCO/VICODIN) 5-325 MG tablet Take 1 tablet by mouth every 4 (four) hours as needed for moderate pain (pain score 4-6). 11/21/23   Gillie Duncans, MD  ketorolac  (ACULAR ) 0.5 % ophthalmic solution PLACE 1 DROP INTO THE LEFT EYE 4 TIMES DAILY. Patient taking differently: Place 1 drop into the left eye daily as needed (seasonal allergy). 03/23/22   Ijaola, Onyeje M, NP  metoprolol  tartrate (LOPRESSOR ) 25 MG tablet Take 1 tablet (25 mg total) by mouth 2 (two) times daily. 10/03/23   [name] HERO, FNP  omeprazole  (PRILOSEC) 20 MG capsule Take 20 mg by mouth daily as needed (heartburn).    [provider]   tiZANidine  (ZANAFLEX ) 4 MG tablet Take 1 tablet (4 mg total) by mouth every 8 (eight) hours as needed for muscle spasms. 11/21/23   Cabbell, Kyle, MD  warfarin (COUMADIN ) 5 MG tablet TAKE 1/2 TO 1 TABLET BY MOUTH ONCE DAILY AS DIRECTED BY COUMADIN  CLINIC 11/12/23   Alvan Dorn FALCON, MD    Physical Exam: Vitals:   12/08/23 1522  BP: (!) 150/91  Pulse: (!) 101  Resp: 20  Temp: 98.8 F (37.1 C)  TempSrc: Axillary  SpO2: 92%  Weight: 76.9 kg  Height: 5' 6 (1.676 m)    Physical Exam Constitutional:      General: He is not in acute distress.    Appearance: Normal appearance. He is ill-appearing.  HENT:     Head: Normocephalic and atraumatic.     Mouth/Throat:     Mouth: Mucous membranes are moist.     Pharynx: Oropharynx is clear.   Eyes:     Extraocular Movements: Extraocular movements intact.     Pupils: Pupils are equal, round, and reactive to light.    Cardiovascular:     Rate and Rhythm: Tachycardia present. Rhythm irregular.     Pulses: Normal pulses.     Heart sounds: Normal heart sounds.  Pulmonary:     Effort: No respiratory distress.     Breath sounds: Normal breath sounds.     Comments: Increased work of breathing. Abdominal:     General: Bowel sounds are normal. There is no distension.     Palpations: Abdomen is soft.     Tenderness: There is no abdominal tenderness.   Musculoskeletal:        General: No swelling or deformity.   Skin:    General: Skin is warm and dry.   Neurological:     General: No focal deficit present.     Motor: Weakness (Globally Weak) present.     Comments: Alert and appropriate, able to follow commands.  Answer some questions.   Labs on Admission: I have personally reviewed following labs and imaging studies  CBC: No results for input(s): WBC, NEUTROABS, HGB, HCT, MCV, PLT in the last 168 hours.  Basic Metabolic Panel: No results for input(s): NA, K, CL, CO2, GLUCOSE, BUN, CREATININE, CALCIUM ,  MG, PHOS in the last 168 hours.  GFR: Estimated Creatinine Clearance: 47.9  mL/min (by C-G formula based on SCr of 1.1 mg/dL).  Liver Function Tests: No results for input(s): AST, ALT, ALKPHOS, BILITOT, PROT, ALBUMIN in the last 168 hours.  Urine analysis:    Component Value Date/Time   COLORURINE YELLOW 09/25/2019 1600   APPEARANCEUR Clear 09/03/2023 1203   LABSPEC 1.027 09/25/2019 1600   PHURINE 5.0 09/25/2019 1600   GLUCOSEU Negative 09/03/2023 1203   HGBUR NEGATIVE 09/25/2019 1600   BILIRUBINUR Negative 09/03/2023 1203   KETONESUR NEGATIVE 09/25/2019 1600   PROTEINUR 1+ (A) 09/03/2023 1203   PROTEINUR NEGATIVE 09/25/2019 1600   UROBILINOGEN 0.2 09/25/2019 1052   NITRITE Negative 09/03/2023 1203   NITRITE NEGATIVE 09/25/2019 1600   LEUKOCYTESUR Negative 09/03/2023 1203   LEUKOCYTESUR NEGATIVE 09/25/2019 1600    Radiological Exams on Admission: No results found.  EKG: Not yet performed here. Will check.  Assessment/Plan Principal Problem:   Sepsis (HCC) Active Problems:   HLD (hyperlipidemia)   ATRIAL FIBRILLATION, CHRONIC   GERD   CAD (coronary artery disease)   COPD GOLD 2 with reversibility    BPH (benign prostatic hyperplasia)   Primary hypertension   Lumbar compression fracture (HCC)   Acute renal failure superimposed on stage 2 chronic kidney disease (HCC)   Anemia   Acute respiratory failure with hypoxia (HCC)   Sepsis Weakness Acute Respiratory Failure with hypoxia Hypotension (resolved) > As per HPI patient presented to outside hospital with ongoing weakness and falls in the setting of recent ORIF of L3-L5 for L4 compression fracture. > Was sent to outside ED after was found to be hypotensive and was worked up there for sepsis while awaiting transfer to our facility after being accepted for neurosurgical evaluation. > As above workup there showed WBC: 20.6, 14.4.  Initial hypotensive has resolved with IV fluids. > No source of  infection or abnormality on chest x-ray, CT head, CT C-spine, CT L-spine, urinalysis. > Urine culture and blood cultures there were no growth x 1 day at the time of transfer.  Lactic acid was normal. > Has improved with vancomycin and ceftazidime there. - Has been accepted to telemetry here, vital signs currently stable. - Continue with vancomycin, add cefepime - Will need to call to follow-up urine culture and blood cultures at outside facility as patient has received multiple rounds of antibiotics so far with improvement in white count. - Consult neurosurgery for recommendations of further imaging to evaluate for possible surgical etiology of this infection. - Get CT chest with nondiagnostic chest x-ray in the setting of respiratory failure with hypoxia and infection/sepsis of unknown source -Trend fever curve and WBC - Continue IV fluids - Supportive care ADDENDUM > Spoke with on-call Neuro Surgery. - MR L Spine w/wo - Will be on their list to see tomorrow AM (6/22)  AKI on CKD 2 > Creatinine elevated to 1.8 on initial presentation now improved to 1.4 with 2 L IV fluids.  This remains elevated from baseline at 1.1. - Continue IV fluids - Trend renal function and electrolytes  Anemia > Hemoglobin noted to be 9 which is down from most recent check of 14.  Unclear if there is some bleeding during surgery or if this could represent hematoma, though nothing was noted on CT. - Continue to trend hemoglobin - Is on warfarin without drop in hemoglobin at outside facility.  Swallowing difficulty > Failed 1 bedside swallow at outside facility but had been swallowing better after he improved overnight.  No formal repeat. - Bedside swallow - Diet  after passes bedside swallow  Lumbar fracture status post ORIF > Ongoing increased pain and weakness as above.  Site looks normal yesterday eating per family, will need assistance in turning patient to evaluate myself.  Neurosurgery will likely  evaluate, if not we will ensure this is looked at. - Neurosurgical consultation as above - As needed pain medication - PT eval and treat. ADDENDUM > Spoke with on-call Neuro Surgery. - MR L Spine w/wo - Will be on their list to see tomorrow AM (6/22)  Hypertension - Resume metoprolol  with improvement in blood pressure  Hyperlipidemia - On Repatha  outpatient  GERD - Continue home PPI  CAD - Continue ASA, warfarin, metoprolol  - On Repatha  outpatient  Atrial fibrillation - Continue metoprolol  - Continue warfarin  COPD ?Exacerbation.  No obvious wheezing on exam but is requiring increased oxygen .  Will get a CT to further clarify. - Continue home Breztri  and albuterol  - Flutter valve, respiratory physiotherapy. - Atrovent now  DVT prophylaxis: Warfarin Code Status:   Full Family Communication:  Updated at bedside  Disposition Plan:   Patient is from:  [facility]  Anticipated DC to:  Home  Anticipated DC date:  2 to 5 days  Anticipated DC barriers: None  Consults called:  Neurosurgery Admission status:  Inpatient, telemetry  Severity of Illness: The appropriate patient status for this patient is INPATIENT. Inpatient status is judged to be reasonable and necessary in order to provide the required intensity of service to ensure the patient's safety. The patient's presenting symptoms, physical exam findings, and initial radiographic and laboratory data in the context of their chronic comorbidities is felt to place them at high risk for further clinical deterioration. Furthermore, it is not anticipated that the patient will be medically stable for discharge from the hospital within 2 midnights of admission.   * I certify that at the point of admission it is my clinical judgment that the patient will require inpatient hospital care spanning beyond 2 midnights from the point of admission due to high intensity of service, high risk for further deterioration and  high frequency of surveillance required.DEWAINE Marsa KATHEE Seena MD [facility]  How to contact the [facility] Attending or Consulting provider 7A - 7P or covering provider during after hours 7P -7A, for this patient?   Check the care team in [employer] and look for a) attending/consulting [facility] provider listed and b) the [facility] team listed Log into [email or web address] and use [place]'s universal password to access. If you do not have the password, please contact the hospital operator. Locate the [facility] provider you are looking for under [facility] and page to a number that you can be directly reached. If you still have difficulty reaching the provider, please page the [facility] (Director on Call) for the Hospitalists listed on amion for assistance.  12/08/2023, 4:36 PM

## 2023-12-08 NOTE — Progress Notes (Signed)
 Pharmacy Antibiotic Note  [name] is a 83 y.o. male admitted on 12/08/2023 with sepsis.  Pharmacy has been consulted for vancomycin and cefepime dosing.  Patient has been received ceftazidime 2g q12h and vancomycin 1.5g q24h since 6/21 at [facility] in [place] - last doses this morning around 10AM.  SCr 1.82 > 1.45, CrCl ~40 ml/min  Plan: Cefepime 2g IV q12h Vancomycin 1g IV q24h starting tomorrow Check vancomycin levels at steady state, goal AUC 400-550 Follow up renal function, cultures as available, clinical progress, length of tx  Height: 5' 6 (167.6 cm) Weight: 76.9 kg (169 lb 8.5 oz) IBW/kg (Calculated) : 63.8  Temp (24hrs), Avg:98.9 F (37.2 C), Min:98.8 F (37.1 C), Max:98.9 F (37.2 C)  No results for input(s): WBC, CREATININE, LATICACIDVEN, VANCOTROUGH, VANCOPEAK, VANCORANDOM, GENTTROUGH, GENTPEAK, GENTRANDOM, TOBRATROUGH, TOBRAPEAK, TOBRARND, AMIKACINPEAK, AMIKACINTROU, AMIKACIN in the last 168 hours.  Estimated Creatinine Clearance: 47.9 mL/min (by C-G formula based on SCr of 1.1 mg/dL).    Allergies  Allergen Reactions   Asa [Aspirin ]     GI upset   Niacin     hot flashes   Pravastatin      myalgias   Rosuvastatin      myalgias   Simvastatin      Myalgias    Tramadol  Other (See Comments)    hallucinations    Antimicrobials this admission: Ceftazidime 6/21 >> 6/22 Vancomycin 6/21 >> Cefepime 6/22 >>  Dose adjustments this admission:  Microbiology results: 6/21 BCx: ngtd 6/21 UCx: ngtd  Thank you for allowing pharmacy to be a part of this patient's care.  Rocky Slade, PharmD, BCPS 12/08/2023 4:50 PM

## 2023-12-09 ENCOUNTER — Encounter (HOSPITAL_COMMUNITY): Payer: Self-pay | Admitting: Internal Medicine

## 2023-12-09 DIAGNOSIS — I4891 Unspecified atrial fibrillation: Secondary | ICD-10-CM | POA: Diagnosis not present

## 2023-12-09 DIAGNOSIS — N179 Acute kidney failure, unspecified: Secondary | ICD-10-CM | POA: Diagnosis not present

## 2023-12-09 DIAGNOSIS — A419 Sepsis, unspecified organism: Secondary | ICD-10-CM | POA: Diagnosis not present

## 2023-12-09 DIAGNOSIS — S37019A Minor contusion of unspecified kidney, initial encounter: Secondary | ICD-10-CM | POA: Diagnosis not present

## 2023-12-09 DIAGNOSIS — D62 Acute posthemorrhagic anemia: Secondary | ICD-10-CM

## 2023-12-09 LAB — CBC
HCT: 31.2 % — ABNORMAL LOW (ref 39.0–52.0)
Hemoglobin: 9.7 g/dL — ABNORMAL LOW (ref 13.0–17.0)
MCH: 29.8 pg (ref 26.0–34.0)
MCHC: 31.1 g/dL (ref 30.0–36.0)
MCV: 95.7 fL (ref 80.0–100.0)
Platelets: 429 10*3/uL — ABNORMAL HIGH (ref 150–400)
RBC: 3.26 MIL/uL — ABNORMAL LOW (ref 4.22–5.81)
RDW: 12.9 % (ref 11.5–15.5)
WBC: 15.1 10*3/uL — ABNORMAL HIGH (ref 4.0–10.5)
nRBC: 0 % (ref 0.0–0.2)

## 2023-12-09 LAB — COMPREHENSIVE METABOLIC PANEL WITH GFR
ALT: 35 U/L (ref 0–44)
AST: 67 U/L — ABNORMAL HIGH (ref 15–41)
Albumin: 2.2 g/dL — ABNORMAL LOW (ref 3.5–5.0)
Alkaline Phosphatase: 60 U/L (ref 38–126)
Anion gap: 8 (ref 5–15)
BUN: 25 mg/dL — ABNORMAL HIGH (ref 8–23)
CO2: 22 mmol/L (ref 22–32)
Calcium: 8.4 mg/dL — ABNORMAL LOW (ref 8.9–10.3)
Chloride: 105 mmol/L (ref 98–111)
Creatinine, Ser: 1.46 mg/dL — ABNORMAL HIGH (ref 0.61–1.24)
GFR, Estimated: 47 mL/min — ABNORMAL LOW (ref >60)
Glucose, Bld: 145 mg/dL — ABNORMAL HIGH (ref 70–99)
Potassium: 4 mmol/L (ref 3.5–5.1)
Sodium: 135 mmol/L (ref 135–145)
Total Bilirubin: 1.1 mg/dL (ref 0.0–1.2)
Total Protein: 6 g/dL — ABNORMAL LOW (ref 6.5–8.1)

## 2023-12-09 LAB — HEMOGLOBIN AND HEMATOCRIT, BLOOD
HCT: 31.5 % — ABNORMAL LOW (ref 39.0–52.0)
Hemoglobin: 9.9 g/dL — ABNORMAL LOW (ref 13.0–17.0)

## 2023-12-09 MED ORDER — LACTATED RINGERS IV SOLN: INTRAVENOUS | Status: DC

## 2023-12-09 MED ORDER — ALUM & MAG HYDROXIDE-SIMETH 200-200-20 MG/5ML PO SUSP
30.0000 mL | ORAL | Status: DC | PRN
  Administered 2023-12-09 – 2023-12-14 (×3): 30 mL via ORAL
  Filled 2023-12-09 (×4): qty 30

## 2023-12-09 MED ORDER — LABETALOL HCL 5 MG/ML IV SOLN
20.0000 mg | INTRAVENOUS | Status: DC | PRN
  Administered 2023-12-09 – 2023-12-11 (×5): 20 mg via INTRAVENOUS
  Filled 2023-12-09 (×6): qty 4

## 2023-12-09 MED ORDER — IPRATROPIUM-ALBUTEROL 0.5-2.5 (3) MG/3ML IN SOLN
3.0000 mL | Freq: Once | RESPIRATORY_TRACT | Status: AC
  Administered 2023-12-09: 3 mL via RESPIRATORY_TRACT
  Filled 2023-12-09: qty 3

## 2023-12-09 NOTE — Progress Notes (Signed)
 [facility] PROGRESS NOTE   [name] FMW:[id number] DOB: 06-Apr-1941 DOA: 12/08/2023  PCP: Severa Rock CHRISTELLA, FNP  Brief History: 83 y.o. male with medical history significant of hypertension, hyperlipidemia, GERD, CKD 2, CAD, BPH, atrial fibrillation, COPD, lumbar fracture presenting from outside hospital after presenting there with weakness and concern for sepsis.  He recently underwent lumbar surgery for compression fractures.  This was done on 11/20/2023. No source of infection was identified.  Patient did have a fall recently as well.  He was subsequently transferred to [facility] for further evaluation.  Imaging studies raise concern for perinephric hematoma.    Consultants: Urology.  Neurosurgery  Procedures: None    Subjective/Interval History: Patient complains of some pain in the right abdomen but better than before.  Some shortness of breath but denies any chest pain.  No nausea vomiting.    Assessment/Plan:  Perinephric hematoma/right kidney laceration This is secondary to recent fall.  Large hematoma identified on imaging studies. Urology was consulted.  Patient's warfarin was discontinued. Hemoglobin has been stable.  Hemodynamically he is stable.  He was tachycardic but that seems to have improved. If patient has hemodynamic compromise or significant drop in hemoglobin we will need IR consult for consideration of embolization.  Concern for sepsis/acute respiratory failure with hypoxia/hypotension It appears that most of the symptoms might have been due to acute blood loss from his perinephric hematoma.  No clear source of infection identified as yet.  Patient was empirically started on vancomycin and cefepime. Follow-up blood culture report.  Will plan on de-escalating antibiotics in the next 24 hours.  Recent lumbar surgery No infection identified on examination or on imaging studies.  Neurosurgery to evaluate patient today. L4 burst fracture  identified on MRI.  Acute kidney injury on chronic kidney disease stage II Increasing creatinine probably due to perinephric hematoma.  Monitor urine output.  Avoid nephrotoxic agents.  Acute blood loss anemia Hemoglobin dropped to 9 from 14 recently.  Likely due to perinephric hematoma.  Chronic atrial fibrillation On metoprolol . Has been on warfarin for anticoagulation.  Did not want to switch to DOAC per patient's daughter Anticoagulation currently on hold due to bleeding.  Difficulties with swallowing Apparently failed swallow evaluation in the outside facility. SLP evaluation is pending.  Essential hypertension Metoprolol  will be continued.  Hyperlipidemia On Repatha  as outpatient.  Coronary artery disease Stable.  Aspirin  is on hold.  COPD Noted to be slightly dyspneic this morning.  Will give nebulizer treatment.  Saturations noted to be in the early 90s on oxygen .  Does not typically use oxygen  at home. No acute findings noted on CT chest.  DVT Prophylaxis: Warfarin on hold.  SCDs Code Status: Full code Family Communication: Discussed with daughter Disposition Plan: Has not been doing well at home in terms of mobility per daughter.  PT and OT eval.  Status is: Inpatient Remains inpatient appropriate because: Perinephric hematoma, acute blood loss anemia, ambulatory dysfunction      Medications: Scheduled:  budesonide -glycopyrrolate -formoterol   2 puff Inhalation BID   metoprolol  tartrate  25 mg Oral BID   pantoprazole   40 mg Oral Daily   sodium chloride  flush  3 mL Intravenous Q12H   Continuous:  ceFEPime (MAXIPIME) IV 2 g (12/08/23 2156)   lactated ringers      lactated ringers  100 mL/hr at 12/08/23 1800   vancomycin     PRN:acetaminophen  **OR** acetaminophen , albuterol , alum & mag hydroxide-simeth, HYDROcodone -acetaminophen , labetalol , polyethylene glycol  Antibiotics: Anti-infectives (From admission, onward)  Start     Dose/Rate Route Frequency  Ordered Stop   12/09/23 1000  vancomycin (VANCOCIN) IVPB 1000 mg/200 mL premix        1,000 mg 200 mL/hr over 60 Minutes Intravenous Every 24 hours 12/08/23 1647     12/08/23 2200  ceFEPIme (MAXIPIME) 2 g in sodium chloride  0.9 % 100 mL IVPB        2 g 200 mL/hr over 30 Minutes Intravenous Every 12 hours 12/08/23 1646         Objective:  Vital Signs  Vitals:   12/09/23 0600 12/09/23 0807 12/09/23 0825 12/09/23 0906  BP: (!) 174/87 (!) 167/106 (!) 167/106   Pulse:  (!) 108 (!) 108 (!) 108  Resp:  20 20 20   Temp:  98.1 F (36.7 C) 98.1 F (36.7 C)   TempSrc:  Oral Axillary   SpO2:  93% 93% 94%  Weight:      Height:        Intake/Output Summary (Last 24 hours) at 12/09/2023 1017 Last data filed at 12/09/2023 0406 Gross per 24 hour  Intake 1102.45 ml  Output --  Net 1102.45 ml   Filed Weights   12/08/23 1522  Weight: 76.9 kg    General appearance: Awake alert.  In no distress Resp: Mildly tachypneic without use of accessory muscles.  Coarse breath sounds bilaterally.  Scattered wheezing.  No rhonchi.  No definite crackles. Cardio: S1-S2 is normal regular.  No S3-S4.  No rubs murmurs or bruit GI: Abdomen is soft.  Nontender nondistended.  Bowel sounds are present normal.  No masses organomegaly Extremities: No edema.  Physical deconditioning noted Neurologic: Alert and oriented x3.  No focal neurological deficits.    Lab Results:  Data Reviewed: I have personally reviewed following labs and reports of the imaging studies  CBC: Recent Labs  Lab 12/08/23 2030 12/09/23 0437  WBC 13.5* 15.1*  HGB 9.4* 9.7*  HCT 30.1* 31.2*  MCV 95.6 95.7  PLT 363 429*    Basic Metabolic Panel: Recent Labs  Lab 12/08/23 2027 12/08/23 2030 12/09/23 0437  NA  --  135 135  K  --  3.8 4.0  CL  --  101 105  CO2  --  24 22  GLUCOSE  --  136* 145*  BUN  --  24* 25*  CREATININE  --  1.48* 1.46*  CALCIUM   --  8.8* 8.4*  MG 2.0  --   --     GFR: Estimated Creatinine  Clearance: 36.1 mL/min (A) (by C-G formula based on SCr of 1.46 mg/dL (H)).  Liver Function Tests: Recent Labs  Lab 12/09/23 0437  AST 67*  ALT 35  ALKPHOS 60  BILITOT 1.1  PROT 6.0*  ALBUMIN 2.2*    Coagulation Profile: Recent Labs  Lab 12/04/23 0932 12/08/23 2030  INR 3.8* 2.4*    Radiology Studies: CT ABDOMEN PELVIS W CONTRAST Result Date: 12/08/2023 CLINICAL DATA:  Abdominal pain.  Perinephric hematoma on chest CT. EXAM: CT ABDOMEN AND PELVIS WITH CONTRAST TECHNIQUE: Multidetector CT imaging of the abdomen and pelvis was performed using the standard protocol following bolus administration of intravenous contrast. RADIATION DOSE REDUCTION: This exam was performed according to the departmental dose-optimization program which includes automated exposure control, adjustment of the mA and/or kV according to patient size and/or use of iterative reconstruction technique. CONTRAST:  75mL OMNIPAQUE  IOHEXOL  350 MG/ML SOLN COMPARISON:  Chest CT earlier today.  Abdominal CT 09/24/2023. FINDINGS: Lower chest: Small bilateral pleural effusions  stable since earlier chest CT. Dependent atelectasis in the right lower lobe. Coronary artery and aortic atherosclerosis. Hepatobiliary: No focal hepatic abnormality. Gallbladder unremarkable. Pancreas: No focal abnormality or ductal dilatation. Spleen: No focal abnormality.  Normal size. Adrenals/Urinary Tract: Adrenal glands and left kidney unremarkable. Heterogeneous fluid collection noted surrounding the right kidney compatible with perinephric hematoma. Area of non enhancement in the mid to lower pole of the right kidney concerning for renal laceration. No active extravasation of contrast seen. No visible urinary extravasation. Urinary bladder unremarkable. Stomach/Bowel: Sigmoid diverticulosis. No active diverticulitis. Stomach and small bowel decompressed. No bowel obstruction. Vascular/Lymphatic: Aortoiliac atherosclerosis. No evidence of aneurysm or  adenopathy. Reproductive: No visible focal abnormality. Other: Trace free fluid in the pelvis.  No free air. Musculoskeletal: Postoperative and degenerative changes in the lumbar spine. Compression fracture noted at L4 as seen on earlier lumbar spine MRI. IMPRESSION: Area of low-density and non enhancement in the mid to lower pole of the right kidney concerning for renal laceration. Associated large right perinephric hematoma which extends beyond the perirenal fascia into the retroperitoneum. Small bilateral pleural effusions. Coronary artery disease, aortic atherosclerosis. L4 compression fracture as described on earlier lumbar spine MRI. Sigmoid diverticulosis. Electronically Signed   By: Franky Crease M.D.   On: 12/08/2023 23:24   MR LUMBAR SPINE W WO CONTRAST Result Date: 12/08/2023 CLINICAL DATA:  Low back pain with infection suspected EXAM: MRI LUMBAR SPINE WITHOUT AND WITH CONTRAST TECHNIQUE: Multiplanar and multiecho pulse sequences of the lumbar spine were obtained without and with intravenous contrast. CONTRAST:  7mL GADAVIST  GADOBUTROL  1 MMOL/ML IV SOLN COMPARISON:  05/19/2021 lumbar spine MRI and 09/24/2023 CT abdomen pelvis FINDINGS: Segmentation:  Standard. Alignment:  Grade 1 retrolisthesis at L2-3 Vertebrae: Incomplete burst fracture of L4 with 50% height loss and 2 mm retropulsion. Mild edema throughout the bone marrow. There is posterior fusion at L3-L5 with bilateral L3 and L5 transpedicular screws at the left L4 transpedicular screw. There is no evidence of discitis-osteomyelitis. Conus medullaris and cauda equina: Conus extends to the L1 level. Conus and cauda equina appear normal. Paraspinal and other soft tissues: Enlarged appearance of the right kidney with abnormal surrounding signal is new compared to prior study. Disc levels: L1-L2: Small disc bulge. No spinal canal stenosis. No neural foraminal stenosis. L2-L3: Disc space narrowing with mild endplate spurring. No spinal canal stenosis.  No neural foraminal stenosis. L3-L4: Posterior instrumented fusion. Small central protrusion. Right lateral recess narrowing without central spinal canal stenosis. No neural foraminal stenosis. L4-L5: Small disc bulge and endplate spurring. No spinal canal stenosis. No neural foraminal stenosis. L5-S1: Disc space narrowing with mild endplate spurring. No spinal canal stenosis. No neural foraminal stenosis. Visualized sacrum: Normal. IMPRESSION: 1. No evidence of discitis-osteomyelitis. 2. Incomplete burst fracture of L4 with 50% height loss and 2 mm retropulsion. Mild edema throughout the bone marrow suggests that this fracture is subacute. 3. Posterior instrumented fusion at L3-L5 with right lateral recess narrowing at L3-L4. 4. Enlarged appearance of the right kidney with abnormal surrounding signal is new compared to prior study. Recommend further evaluation with CT of the abdomen and pelvis with contrast. Electronically Signed   By: Franky Stanford M.D.   On: 12/08/2023 19:41   CT CHEST WO CONTRAST Addendum Date: 12/08/2023 ADDENDUM REPORT: 12/08/2023 19:14 ADDENDUM: Critical Value/emergent results were called by telephone at the time of interpretation on 12/08/2023 at 7:11 pm to provider ALEXANDER MELVIN , who verbally acknowledged these results. Electronically Signed   By: Leita  Waddell M.D.   On: 12/08/2023 19:14   Result Date: 12/08/2023 CLINICAL DATA:  Respiratory illness, nondiagnostic x-ray. EXAM: CT CHEST WITHOUT CONTRAST TECHNIQUE: Multidetector CT imaging of the chest was performed following the standard protocol without IV contrast. RADIATION DOSE REDUCTION: This exam was performed according to the departmental dose-optimization program which includes automated exposure control, adjustment of the mA and/or kV according to patient size and/or use of iterative reconstruction technique. COMPARISON:  04/30/2023, 09/24/2023. FINDINGS: Cardiovascular: The heart is normal in size and there is a trace  pericardial effusion. Three-vessel coronary artery calcifications are noted. There is atherosclerotic calcification aorta without evidence of aneurysm. The pulmonary trunk is normal in caliber. Mediastinum/Nodes: A nonspecific prominent lymph node is present in the [place] off a GIA low fat measuring 1 cm. No axillary lymphadenopathy. Evaluation of the hila is limited due to lack of IV contrast. The trachea and esophagus are within normal limits. Lungs/Pleura: Centrilobular emphysematous changes are present in the lungs. Slightly hyperdense small pleural effusions are noted bilaterally, greater on the right than on the left. Atelectasis is noted bilaterally. A parenchymal band is noted in the posterior aspect of the left upper lobe and the previous area of nodularity is not well-defined, however measures 1.5 x 0.7 cm. No pneumothorax is seen. Examination is limited due to respiratory motion artifact. Upper Abdomen: The right kidney is enlarged with partially visualized surrounding hyperdense region which is new from the previous exam, query perinephric hematoma. Musculoskeletal: Degenerative changes are present in the thoracic spine. No acute fracture is seen. IMPRESSION: 1. Small hyperdense pleural effusions bilaterally, query small hemothorax versus infection. 2. Scattered atelectasis bilaterally. 3. Nodular region in the posterior aspect of the left upper lobe, not well delineated due to atelectasis and respiratory motion artifact and may be decreased in size. Non emergent repeat evaluation is recommended. 4. Enlargement of the right kidney with peripheral hyperdense material partially visualized on exam, concerning for perinephric hematoma. Dedicated CT with contrast is recommended for further evaluation. 5. Emphysema. 6. Coronary artery calcifications. 7. Aortic atherosclerosis. Electronically Signed: By: Leita Waddell M.D. On: 12/08/2023 18:54       LOS: 1 day   Joette Pebbles  [facility] Pager  on [email or web address]  12/09/2023, 10:17 AM

## 2023-12-09 NOTE — Plan of Care (Signed)
 Has been talkative today and has slept off and on throughout the day.  Daughter was worried that he might be up most of the night and even try to go home because he gets confused and was hallucinating last night and has been asking for his hat and keys to go home today.  Has been redirected often.    Problem: Clinical Measurements: Goal: Will remain free from infection Outcome: Progressing   Problem: Clinical Measurements: Goal: Diagnostic test results will improve Outcome: Progressing   Problem: Clinical Measurements: Goal: Respiratory complications will improve Outcome: Progressing   Problem: Clinical Measurements: Goal: Cardiovascular complication will be avoided Outcome: Progressing   Problem: Nutrition: Goal: Adequate nutrition will be maintained Outcome: Progressing   Problem: Coping: Goal: Level of anxiety will decrease Outcome: Progressing   Problem: Pain Managment: Goal: General experience of comfort will improve and/or be controlled Outcome: Progressing

## 2023-12-09 NOTE — Progress Notes (Signed)
 SLP Cancellation Note  Patient Details Name: [name] MRN: [id number] DOB: [birth year]   Cancelled treatment:       Reason Eval/Treat Not Completed: SLP screened, pt passed the swallow screen. RN reports no concerns with pt's swallowing. No needs identified, will sign off.   Damien Blumenthal, M.A., CCC-SLP Speech Language Pathology, Acute Rehabilitation Services  Secure Chat preferred [phone]  12/09/2023, 11:02 AM

## 2023-12-09 NOTE — Plan of Care (Signed)
  Problem: Education: Goal: Knowledge of General Education information will improve Description: Including pain rating scale, medication(s)/side effects and non-pharmacologic comfort measures Outcome: Progressing   Problem: Health Behavior/Discharge Planning: Goal: Ability to manage health-related needs will improve Outcome: Progressing   Problem: Clinical Measurements: Goal: Ability to maintain clinical measurements within normal limits will improve Outcome: Progressing Goal: Will remain free from infection Outcome: Progressing Goal: Diagnostic test results will improve Outcome: Progressing Goal: Respiratory complications will improve Outcome: Progressing Goal: Cardiovascular complication will be avoided Outcome: Progressing   Problem: Activity: Goal: Risk for activity intolerance will decrease Outcome: Progressing   Problem: Nutrition: Goal: Adequate nutrition will be maintained Outcome: Progressing   Problem: Coping: Goal: Level of anxiety will decrease Outcome: Progressing   Problem: Elimination: Goal: Will not experience complications related to bowel motility Outcome: Progressing Goal: Will not experience complications related to urinary retention Outcome: Progressing   Problem: Pain Managment: Goal: General experience of comfort will improve and/or be controlled Outcome: Progressing   Problem: Safety: Goal: Ability to remain free from injury will improve Outcome: Progressing   Problem: Skin Integrity: Goal: Risk for impaired skin integrity will decrease Outcome: Progressing   Problem: Fluid Volume: Goal: Hemodynamic stability will improve Outcome: Progressing   Problem: Clinical Measurements: Goal: Diagnostic test results will improve Outcome: Progressing Goal: Signs and symptoms of infection will decrease Outcome: Progressing   Problem: Respiratory: Goal: Ability to maintain adequate ventilation will improve Outcome: Progressing   Problem:  Clinical Measurements: Goal: Signs and symptoms of infection will decrease Outcome: Progressing   Problem: Respiratory: Goal: Ability to maintain adequate ventilation will improve Outcome: Progressing

## 2023-12-09 NOTE — Progress Notes (Signed)
 Subjective: Denies pain, HR improved, has some reflux and resultant nausea from this, says it is because he has not eaten.   Objective: Vital signs in last 24 hours: Temp:  [98 F (36.7 C)-98.9 F (37.2 C)] 98 F (36.7 C) (06/23 0415) Pulse Rate:  [101-127] 127 (06/23 0415) Resp:  [18-20] 18 (06/22 2329) BP: (128-174)/(71-115) 174/87 (06/23 0600) SpO2:  [92 %-100 %] 99 % (06/23 0415) Weight:  [76.9 kg] 76.9 kg (06/22 1522)  Intake/Output from previous day: 06/22 0701 - 06/23 0700 In: 1102.5 [I.V.:1000; IV Piggyback:102.5] Out: -  Intake/Output this shift: Total I/O In: 992.5 [I.V.:890; IV Piggyback:102.5] Out: -   Physical Exam:  General: Alert and oriented CV: RRR Lungs: Clear Abdomen: Soft GU: no foley in place   Lab Results: Recent Labs    12/08/23 2030 12/09/23 0437  HGB 9.4* 9.7*  HCT 30.1* 31.2*   BMET Recent Labs    12/08/23 2030 12/09/23 0437  NA 135 135  K 3.8 4.0  CL 101 105  CO2 24 22  GLUCOSE 136* 145*  BUN 24* 25*  CREATININE 1.48* 1.46*  CALCIUM  8.8* 8.4*     Studies/Results: CT ABDOMEN PELVIS W CONTRAST Result Date: 12/08/2023 CLINICAL DATA:  Abdominal pain.  Perinephric hematoma on chest CT. EXAM: CT ABDOMEN AND PELVIS WITH CONTRAST TECHNIQUE: Multidetector CT imaging of the abdomen and pelvis was performed using the standard protocol following bolus administration of intravenous contrast. RADIATION DOSE REDUCTION: This exam was performed according to the departmental dose-optimization program which includes automated exposure control, adjustment of the mA and/or kV according to patient size and/or use of iterative reconstruction technique. CONTRAST:  75mL OMNIPAQUE  IOHEXOL  350 MG/ML SOLN COMPARISON:  Chest CT earlier today.  Abdominal CT 09/24/2023. FINDINGS: Lower chest: Small bilateral pleural effusions stable since earlier chest CT. Dependent atelectasis in the right lower lobe. Coronary artery and aortic atherosclerosis. Hepatobiliary:  No focal hepatic abnormality. Gallbladder unremarkable. Pancreas: No focal abnormality or ductal dilatation. Spleen: No focal abnormality.  Normal size. Adrenals/Urinary Tract: Adrenal glands and left kidney unremarkable. Heterogeneous fluid collection noted surrounding the right kidney compatible with perinephric hematoma. Area of non enhancement in the mid to lower pole of the right kidney concerning for renal laceration. No active extravasation of contrast seen. No visible urinary extravasation. Urinary bladder unremarkable. Stomach/Bowel: Sigmoid diverticulosis. No active diverticulitis. Stomach and small bowel decompressed. No bowel obstruction. Vascular/Lymphatic: Aortoiliac atherosclerosis. No evidence of aneurysm or adenopathy. Reproductive: No visible focal abnormality. Other: Trace free fluid in the pelvis.  No free air. Musculoskeletal: Postoperative and degenerative changes in the lumbar spine. Compression fracture noted at L4 as seen on earlier lumbar spine MRI. IMPRESSION: Area of low-density and non enhancement in the mid to lower pole of the right kidney concerning for renal laceration. Associated large right perinephric hematoma which extends beyond the perirenal fascia into the retroperitoneum. Small bilateral pleural effusions. Coronary artery disease, aortic atherosclerosis. L4 compression fracture as described on earlier lumbar spine MRI. Sigmoid diverticulosis. Electronically Signed   By: Franky Crease M.D.   On: 12/08/2023 23:24   MR LUMBAR SPINE W WO CONTRAST Result Date: 12/08/2023 CLINICAL DATA:  Low back pain with infection suspected EXAM: MRI LUMBAR SPINE WITHOUT AND WITH CONTRAST TECHNIQUE: Multiplanar and multiecho pulse sequences of the lumbar spine were obtained without and with intravenous contrast. CONTRAST:  7mL GADAVIST  GADOBUTROL  1 MMOL/ML IV SOLN COMPARISON:  05/19/2021 lumbar spine MRI and 09/24/2023 CT abdomen pelvis FINDINGS: Segmentation:  Standard. Alignment:  Grade 1  retrolisthesis at L2-3 Vertebrae: Incomplete burst fracture of L4 with 50% height loss and 2 mm retropulsion. Mild edema throughout the bone marrow. There is posterior fusion at L3-L5 with bilateral L3 and L5 transpedicular screws at the left L4 transpedicular screw. There is no evidence of discitis-osteomyelitis. Conus medullaris and cauda equina: Conus extends to the L1 level. Conus and cauda equina appear normal. Paraspinal and other soft tissues: Enlarged appearance of the right kidney with abnormal surrounding signal is new compared to prior study. Disc levels: L1-L2: Small disc bulge. No spinal canal stenosis. No neural foraminal stenosis. L2-L3: Disc space narrowing with mild endplate spurring. No spinal canal stenosis. No neural foraminal stenosis. L3-L4: Posterior instrumented fusion. Small central protrusion. Right lateral recess narrowing without central spinal canal stenosis. No neural foraminal stenosis. L4-L5: Small disc bulge and endplate spurring. No spinal canal stenosis. No neural foraminal stenosis. L5-S1: Disc space narrowing with mild endplate spurring. No spinal canal stenosis. No neural foraminal stenosis. Visualized sacrum: Normal. IMPRESSION: 1. No evidence of discitis-osteomyelitis. 2. Incomplete burst fracture of L4 with 50% height loss and 2 mm retropulsion. Mild edema throughout the bone marrow suggests that this fracture is subacute. 3. Posterior instrumented fusion at L3-L5 with right lateral recess narrowing at L3-L4. 4. Enlarged appearance of the right kidney with abnormal surrounding signal is new compared to prior study. Recommend further evaluation with CT of the abdomen and pelvis with contrast. Electronically Signed   By: Franky Stanford M.D.   On: 12/08/2023 19:41   CT CHEST WO CONTRAST Addendum Date: 12/08/2023 ADDENDUM REPORT: 12/08/2023 19:14 ADDENDUM: Critical Value/emergent results were called by telephone at the time of interpretation on 12/08/2023 at 7:11 pm to provider  ALEXANDER MELVIN , who verbally acknowledged these results. Electronically Signed   By: Leita Birmingham M.D.   On: 12/08/2023 19:14   Result Date: 12/08/2023 CLINICAL DATA:  Respiratory illness, nondiagnostic x-ray. EXAM: CT CHEST WITHOUT CONTRAST TECHNIQUE: Multidetector CT imaging of the chest was performed following the standard protocol without IV contrast. RADIATION DOSE REDUCTION: This exam was performed according to the departmental dose-optimization program which includes automated exposure control, adjustment of the mA and/or kV according to patient size and/or use of iterative reconstruction technique. COMPARISON:  04/30/2023, 09/24/2023. FINDINGS: Cardiovascular: The heart is normal in size and there is a trace pericardial effusion. Three-vessel coronary artery calcifications are noted. There is atherosclerotic calcification aorta without evidence of aneurysm. The pulmonary trunk is normal in caliber. Mediastinum/Nodes: A nonspecific prominent lymph node is present in the [place] off a GIA low fat measuring 1 cm. No axillary lymphadenopathy. Evaluation of the hila is limited due to lack of IV contrast. The trachea and esophagus are within normal limits. Lungs/Pleura: Centrilobular emphysematous changes are present in the lungs. Slightly hyperdense small pleural effusions are noted bilaterally, greater on the right than on the left. Atelectasis is noted bilaterally. A parenchymal band is noted in the posterior aspect of the left upper lobe and the previous area of nodularity is not well-defined, however measures 1.5 x 0.7 cm. No pneumothorax is seen. Examination is limited due to respiratory motion artifact. Upper Abdomen: The right kidney is enlarged with partially visualized surrounding hyperdense region which is new from the previous exam, query perinephric hematoma. Musculoskeletal: Degenerative changes are present in the thoracic spine. No acute fracture is seen. IMPRESSION: 1. Small hyperdense pleural  effusions bilaterally, query small hemothorax versus infection. 2. Scattered atelectasis bilaterally. 3. Nodular region in the posterior aspect of the left upper lobe,  not well delineated due to atelectasis and respiratory motion artifact and may be decreased in size. Non emergent repeat evaluation is recommended. 4. Enlargement of the right kidney with peripheral hyperdense material partially visualized on exam, concerning for perinephric hematoma. Dedicated CT with contrast is recommended for further evaluation. 5. Emphysema. 6. Coronary artery calcifications. 7. Aortic atherosclerosis. Electronically Signed: By: Leita Birmingham M.D. 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  This current injury was complicated by COPD as well his atrial fibrillation and recent back surgery.   Recommendations: Perinephric hematoma - H&H has been stable (30 and 31)  - continue to hold warfarin (INR 2.4)  - Hemoglobin stable continue to trend H&H's every 8 hours - if next H&H is stable patient may start diet - Transfuse as needed - Recommend bedrest for 48 hours  - If patient becomes unstable recommend stat IR consult for right kidney embolization.  - no plan urologic intervention    Atrial fibrillation - Currently in A-fib RVR - Pending hemoglobin address as needed   History of BPH: - Denies issues with urination currently - No intervention needed  GERD: - start PPI for symptoms    LOS: 1 day   Jackey Pea MD 12/09/2023, 6:51 AM [facility]

## 2023-12-10 ENCOUNTER — Inpatient Hospital Stay (HOSPITAL_COMMUNITY)

## 2023-12-10 DIAGNOSIS — I4891 Unspecified atrial fibrillation: Secondary | ICD-10-CM | POA: Diagnosis not present

## 2023-12-10 DIAGNOSIS — N179 Acute kidney failure, unspecified: Secondary | ICD-10-CM | POA: Diagnosis not present

## 2023-12-10 DIAGNOSIS — S37019A Minor contusion of unspecified kidney, initial encounter: Secondary | ICD-10-CM | POA: Diagnosis not present

## 2023-12-10 DIAGNOSIS — A419 Sepsis, unspecified organism: Secondary | ICD-10-CM | POA: Diagnosis not present

## 2023-12-10 LAB — BASIC METABOLIC PANEL WITH GFR
Anion gap: 11 (ref 5–15)
BUN: 25 mg/dL — ABNORMAL HIGH (ref 8–23)
CO2: 21 mmol/L — ABNORMAL LOW (ref 22–32)
Calcium: 8.7 mg/dL — ABNORMAL LOW (ref 8.9–10.3)
Chloride: 104 mmol/L (ref 98–111)
Creatinine, Ser: 1.29 mg/dL — ABNORMAL HIGH (ref 0.61–1.24)
GFR, Estimated: 54 mL/min — ABNORMAL LOW (ref >60)
Glucose, Bld: 153 mg/dL — ABNORMAL HIGH (ref 70–99)
Potassium: 4.3 mmol/L (ref 3.5–5.1)
Sodium: 136 mmol/L (ref 135–145)

## 2023-12-10 LAB — CBC
HCT: 30.9 % — ABNORMAL LOW (ref 39.0–52.0)
Hemoglobin: 9.7 g/dL — ABNORMAL LOW (ref 13.0–17.0)
MCH: 30 pg (ref 26.0–34.0)
MCHC: 31.4 g/dL (ref 30.0–36.0)
MCV: 95.7 fL (ref 80.0–100.0)
Platelets: 459 10*3/uL — ABNORMAL HIGH (ref 150–400)
RBC: 3.23 MIL/uL — ABNORMAL LOW (ref 4.22–5.81)
RDW: 12.7 % (ref 11.5–15.5)
WBC: 17.6 10*3/uL — ABNORMAL HIGH (ref 4.0–10.5)
nRBC: 0.1 % (ref 0.0–0.2)

## 2023-12-10 MED ORDER — ONDANSETRON HCL 4 MG/2ML IJ SOLN
4.0000 mg | Freq: Four times a day (QID) | INTRAMUSCULAR | Status: DC | PRN
  Administered 2023-12-10: 4 mg via INTRAVENOUS
  Filled 2023-12-10: qty 2

## 2023-12-10 MED ORDER — TIZANIDINE HCL 4 MG PO TABS
4.0000 mg | ORAL_TABLET | Freq: Three times a day (TID) | ORAL | Status: DC | PRN
  Administered 2023-12-10 – 2023-12-12 (×2): 4 mg via ORAL
  Filled 2023-12-10 (×2): qty 1

## 2023-12-10 MED ORDER — FUROSEMIDE 10 MG/ML IJ SOLN
40.0000 mg | Freq: Once | INTRAMUSCULAR | Status: AC
  Administered 2023-12-10: 40 mg via INTRAVENOUS
  Filled 2023-12-10: qty 4

## 2023-12-10 MED ORDER — DM-GUAIFENESIN ER 30-600 MG PO TB12
2.0000 | ORAL_TABLET | Freq: Every day | ORAL | Status: DC
  Administered 2023-12-10 – 2023-12-11 (×2): 2 via ORAL
  Filled 2023-12-10 (×2): qty 2

## 2023-12-10 MED ORDER — IPRATROPIUM-ALBUTEROL 0.5-2.5 (3) MG/3ML IN SOLN
3.0000 mL | Freq: Four times a day (QID) | RESPIRATORY_TRACT | Status: DC
  Administered 2023-12-10 – 2023-12-12 (×9): 3 mL via RESPIRATORY_TRACT
  Filled 2023-12-10 (×9): qty 3

## 2023-12-10 MED ORDER — LORAZEPAM 2 MG/ML IJ SOLN
0.5000 mg | Freq: Once | INTRAMUSCULAR | Status: AC
  Administered 2023-12-10: 0.5 mg via INTRAVENOUS
  Filled 2023-12-10: qty 1

## 2023-12-10 NOTE — Plan of Care (Signed)
 Family has been at bedside most of the day.  Spoke with primary MD and urologist and got all questions answered.  IV fluids were stopped and he was given IV lasix , as he was sounding moist and had increased fluid.  Received Lorazepam because had some agitation and anxiety.  Resting comfortably at this time.  Problem: Coping: Goal: Level of anxiety will decrease Outcome: Progressing   Problem: Nutrition: Goal: Adequate nutrition will be maintained Outcome: Progressing   Problem: Pain Managment: Goal: General experience of comfort will improve and/or be controlled Outcome: Progressing   Problem: Safety: Goal: Ability to remain free from injury will improve Outcome: Progressing   Problem: Skin Integrity: Goal: Risk for impaired skin integrity will decrease Outcome: Progressing

## 2023-12-10 NOTE — Progress Notes (Signed)
 PT Cancellation Note  Patient Details Name: [name] MRN: [id number] DOB: [birth year]   Cancelled Treatment:    Reason Eval/Treat Not Completed: Active bedrest order  [name], PT, DPT Acute [facility] Services Office [phone]    [name] 12/10/2023, 7:59 AM

## 2023-12-10 NOTE — Progress Notes (Signed)
 Subjective: Labs remain stable, he now feels like he is having trouble urinating, complains of new upper GI pain.   Objective: Vital signs in last 24 hours: Temp:  [97.4 F (36.3 C)-98.7 F (37.1 C)] 97.9 F (36.6 C) (06/24 0813) Pulse Rate:  [80-111] 100 (06/24 0813) Resp:  [15-20] 18 (06/24 0813) BP: (143-189)/(74-100) 186/78 (06/24 0813) SpO2:  [94 %-99 %] 95 % (06/24 0813)  Intake/Output from previous day: 06/23 0701 - 06/24 0700 In: 1405.5 [P.O.:240; I.V.:965.5; IV Piggyback:200] Out: 1400 [Urine:1400] Intake/Output this shift: Total I/O In: -  Out: 850 [Urine:850]  Physical Exam:  General: Alert and oriented CV: RRR Lungs: Clear Abdomen: mildly distended, ND, midl tenderness, no flank pain of back pain  GU: urine collection bag over penis, some urine in collection tubing is clear yellow   Lab Results: Recent Labs    12/09/23 0437 12/09/23 1221 12/10/23 0434  HGB 9.7* 9.9* 9.7*  HCT 31.2* 31.5* 30.9*   BMET Recent Labs    12/09/23 0437 12/10/23 0434  NA 135 136  K 4.0 4.3  CL 105 104  CO2 22 21*  GLUCOSE 145* 153*  BUN 25* 25*  CREATININE 1.46* 1.29*  CALCIUM  8.4* 8.7*     Studies/Results: CT ABDOMEN PELVIS W CONTRAST Result Date: 12/08/2023 CLINICAL DATA:  Abdominal pain.  Perinephric hematoma on chest CT. EXAM: CT ABDOMEN AND PELVIS WITH CONTRAST TECHNIQUE: Multidetector CT imaging of the abdomen and pelvis was performed using the standard protocol following bolus administration of intravenous contrast. RADIATION DOSE REDUCTION: This exam was performed according to the departmental dose-optimization program which includes automated exposure control, adjustment of the mA and/or kV according to patient size and/or use of iterative reconstruction technique. CONTRAST:  75mL OMNIPAQUE  IOHEXOL  350 MG/ML SOLN COMPARISON:  Chest CT earlier today.  Abdominal CT 09/24/2023. FINDINGS: Lower chest: Small bilateral pleural effusions stable since earlier chest CT.  Dependent atelectasis in the right lower lobe. Coronary artery and aortic atherosclerosis. Hepatobiliary: No focal hepatic abnormality. Gallbladder unremarkable. Pancreas: No focal abnormality or ductal dilatation. Spleen: No focal abnormality.  Normal size. Adrenals/Urinary Tract: Adrenal glands and left kidney unremarkable. Heterogeneous fluid collection noted surrounding the right kidney compatible with perinephric hematoma. Area of non enhancement in the mid to lower pole of the right kidney concerning for renal laceration. No active extravasation of contrast seen. No visible urinary extravasation. Urinary bladder unremarkable. Stomach/Bowel: Sigmoid diverticulosis. No active diverticulitis. Stomach and small bowel decompressed. No bowel obstruction. Vascular/Lymphatic: Aortoiliac atherosclerosis. No evidence of aneurysm or adenopathy. Reproductive: No visible focal abnormality. Other: Trace free fluid in the pelvis.  No free air. Musculoskeletal: Postoperative and degenerative changes in the lumbar spine. Compression fracture noted at L4 as seen on earlier lumbar spine MRI. IMPRESSION: Area of low-density and non enhancement in the mid to lower pole of the right kidney concerning for renal laceration. Associated large right perinephric hematoma which extends beyond the perirenal fascia into the retroperitoneum. Small bilateral pleural effusions. Coronary artery disease, aortic atherosclerosis. L4 compression fracture as described on earlier lumbar spine MRI. Sigmoid diverticulosis. Electronically Signed   By: Franky Crease M.D.   On: 12/08/2023 23:24   MR LUMBAR SPINE W WO CONTRAST Result Date: 12/08/2023 CLINICAL DATA:  Low back pain with infection suspected EXAM: MRI LUMBAR SPINE WITHOUT AND WITH CONTRAST TECHNIQUE: Multiplanar and multiecho pulse sequences of the lumbar spine were obtained without and with intravenous contrast. CONTRAST:  7mL GADAVIST  GADOBUTROL  1 MMOL/ML IV SOLN COMPARISON:  05/19/2021  lumbar spine MRI  and 09/24/2023 CT abdomen pelvis FINDINGS: Segmentation:  Standard. Alignment:  Grade 1 retrolisthesis at L2-3 Vertebrae: Incomplete burst fracture of L4 with 50% height loss and 2 mm retropulsion. Mild edema throughout the bone marrow. There is posterior fusion at L3-L5 with bilateral L3 and L5 transpedicular screws at the left L4 transpedicular screw. There is no evidence of discitis-osteomyelitis. Conus medullaris and cauda equina: Conus extends to the L1 level. Conus and cauda equina appear normal. Paraspinal and other soft tissues: Enlarged appearance of the right kidney with abnormal surrounding signal is new compared to prior study. Disc levels: L1-L2: Small disc bulge. No spinal canal stenosis. No neural foraminal stenosis. L2-L3: Disc space narrowing with mild endplate spurring. No spinal canal stenosis. No neural foraminal stenosis. L3-L4: Posterior instrumented fusion. Small central protrusion. Right lateral recess narrowing without central spinal canal stenosis. No neural foraminal stenosis. L4-L5: Small disc bulge and endplate spurring. No spinal canal stenosis. No neural foraminal stenosis. L5-S1: Disc space narrowing with mild endplate spurring. No spinal canal stenosis. No neural foraminal stenosis. Visualized sacrum: Normal. IMPRESSION: 1. No evidence of discitis-osteomyelitis. 2. Incomplete burst fracture of L4 with 50% height loss and 2 mm retropulsion. Mild edema throughout the bone marrow suggests that this fracture is subacute. 3. Posterior instrumented fusion at L3-L5 with right lateral recess narrowing at L3-L4. 4. Enlarged appearance of the right kidney with abnormal surrounding signal is new compared to prior study. Recommend further evaluation with CT of the abdomen and pelvis with contrast. Electronically Signed   By: Franky Stanford M.D.   On: 12/08/2023 19:41   CT CHEST WO CONTRAST Addendum Date: 12/08/2023 ADDENDUM REPORT: 12/08/2023 19:14 ADDENDUM: Critical  Value/emergent results were called by telephone at the time of interpretation on 12/08/2023 at 7:11 pm to provider ALEXANDER MELVIN , who verbally acknowledged these results. Electronically Signed   By: Leita Birmingham M.D.   On: 12/08/2023 19:14   Result Date: 12/08/2023 CLINICAL DATA:  Respiratory illness, nondiagnostic x-ray. EXAM: CT CHEST WITHOUT CONTRAST TECHNIQUE: Multidetector CT imaging of the chest was performed following the standard protocol without IV contrast. RADIATION DOSE REDUCTION: This exam was performed according to the departmental dose-optimization program which includes automated exposure control, adjustment of the mA and/or kV according to patient size and/or use of iterative reconstruction technique. COMPARISON:  04/30/2023, 09/24/2023. FINDINGS: Cardiovascular: The heart is normal in size and there is a trace pericardial effusion. Three-vessel coronary artery calcifications are noted. There is atherosclerotic calcification aorta without evidence of aneurysm. The pulmonary trunk is normal in caliber. Mediastinum/Nodes: A nonspecific prominent lymph node is present in the [place] off a GIA low fat measuring 1 cm. No axillary lymphadenopathy. Evaluation of the hila is limited due to lack of IV contrast. The trachea and esophagus are within normal limits. Lungs/Pleura: Centrilobular emphysematous changes are present in the lungs. Slightly hyperdense small pleural effusions are noted bilaterally, greater on the right than on the left. Atelectasis is noted bilaterally. A parenchymal band is noted in the posterior aspect of the left upper lobe and the previous area of nodularity is not well-defined, however measures 1.5 x 0.7 cm. No pneumothorax is seen. Examination is limited due to respiratory motion artifact. Upper Abdomen: The right kidney is enlarged with partially visualized surrounding hyperdense region which is new from the previous exam, query perinephric hematoma. Musculoskeletal:  Degenerative changes are present in the thoracic spine. No acute fracture is seen. IMPRESSION: 1. Small hyperdense pleural effusions bilaterally, query small hemothorax versus infection. 2. Scattered atelectasis  bilaterally. 3. Nodular region in the posterior aspect of the left upper lobe, not well delineated due to atelectasis and respiratory motion artifact and may be decreased in size. Non emergent repeat evaluation is recommended. 4. Enlargement of the right kidney with peripheral hyperdense material partially visualized on exam, concerning for perinephric hematoma. Dedicated CT with contrast is recommended for further evaluation. 5. Emphysema. 6. Coronary artery calcifications. 7. Aortic atherosclerosis. Electronically Signed: By: Leita Birmingham M.D. 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  This current injury was complicated by COPD as well his atrial fibrillation and recent back surgery.   Recommendations: Perinephric hematoma - H&H stable - continue to trend daily - if H&H stable tomorrow may restart anticoagulation  - Transfuse as needed - can DC bed rest - If patient becomes unstable recommend stat IR consult for right kidney embolization.  - no planned urologic intervention  - urology will set up f/u in 3 months for repeat scan to evaluate resolution of hematoma and status of AML  Increase WBC: - if WBC continues to increase over the next several days consider rescan A/P to evaluated developing abscess     Atrial fibrillation - Currently in A-fib RVR - Pending hemoglobin address as needed   History of BPH: - Denies issues with urination currently - now having sensation of needing to urinate.  - bladder scan ordered.    GERD: - start PPI for symptoms    Pneumonia; - per Primary team   Urology to follow peripherally.    LOS: 2 days   Jackey Pea MD 12/10/2023, 8:33 AM [facility]

## 2023-12-10 NOTE — TOC Initial Note (Addendum)
 Transition of Care [facility]) - Initial/Assessment Note    Patient Details  Name: [name] MRN: [id number] Date of Birth: 06/06/1941  Transition of Care [facility]) CM/SW Contact:    Andrez JULIANNA George, RN Phone Number: 12/10/2023, 2:35 PM  Clinical Narrative:                  Pt is sleeping at time of CM visit. CM spoke with patient's wife and daughter at the bedside. Wife and patient are together most of the time. Pt was using wheelchair mainly for transportation. Could walk short distances.  Pts children managed his medications and provided needed transportation. Awaiting therapy evals. Family hoping for rehab prior to return home. TOC following.   Expected Discharge Plan:  (TBD) Barriers to Discharge: Continued Medical Work up   Patient Goals and CMS Choice            Expected Discharge Plan and Services       Living arrangements for the past 2 months: Single Family Home                                      Prior Living Arrangements/Services Living arrangements for the past 2 months: Single Family Home Lives with:: Spouse Patient language and need for interpreter reviewed:: Yes            Current home services: DME (walker/ wheelchair/ shower seat) Criminal Activity/Legal Involvement Pertinent to Current Situation/Hospitalization: No - Comment as needed  Activities of Daily Living   ADL Screening (condition at time of admission) Independently performs ADLs?: No Does the patient have a NEW difficulty with bathing/dressing/toileting/self-feeding that is expected to last >3 days?: Yes (Initiates electronic notice to provider for possible OT consult) Does the patient have a NEW difficulty with getting in/out of bed, walking, or climbing stairs that is expected to last >3 days?: Yes (Initiates electronic notice to provider for possible PT consult) Does the patient have a NEW difficulty with communication that is expected to last >3 days?: No Is the patient deaf  or have difficulty hearing?: Yes Does the patient have difficulty seeing, even when wearing glasses/contacts?: No Does the patient have difficulty concentrating, remembering, or making decisions?: Yes  Permission Sought/Granted                  Emotional Assessment Appearance:: Appears stated age         Psych Involvement: No (comment)  Admission diagnosis:  Sepsis (HCC) [A41.9] Hypotension [I95.9] Pain in back [M54.9] Patient Active Problem List   Diagnosis Date Noted   Sepsis (HCC) 12/08/2023   Acute renal failure superimposed on stage 2 chronic kidney disease (HCC) 12/08/2023   Anemia 12/08/2023   Acute respiratory failure with hypoxia (HCC) 12/08/2023   Lumbar compression fracture (HCC) 08/08/2023   Vitamin B 12 deficiency 07/23/2023   Primary hypertension 04/21/2023   TIA (transient ischemic attack) 04/21/2023   Leg swelling 08/31/2022   Upper airway cough syndrome 04/13/2022   Chronic fatigue 09/19/2021   Solitary pulmonary nodule on lung CT 09/05/2021   Shuffling gait 08/05/2020   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   Meibomian gland dysfunction (MGD) of both eyes 05/08/2020   Cicatricial lagophthalmos of left upper eyelid 03/22/2020   Salzmann's nodular dystrophy of left eye 03/22/2020   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 03/22/2020   Vitamin D  insufficiency    Prediabetes 03/31/2018   BPH (benign  prostatic hyperplasia) 02/07/2017   Chronic kidney disease (CKD), 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   Vertigo 02/07/2017   Chronic anticoagulation 02/27/2013   COPD GOLD 2 with reversibility  01/21/2013   Pulmonary nodules 12/19/2012   CAD (coronary artery disease)    HLD (hyperlipidemia) 11/01/2009   ATRIAL FIBRILLATION, CHRONIC 11/01/2009   GERD 11/01/2009   PCP:  Severa Rock HERO, FNP Pharmacy:   [facility] - [place], [place] - [address] [place] [address] Phone: [phone] Fax:  [phone]  [facility] - [place], [place] - [address] [place] [address] Phone: [phone] Fax: [phone]  [facility] [address] [place] - [address] [place] [address] Phone: [phone] Fax: [phone]     Social Drivers of Health (SDOH) Social History: SDOH Screenings   Food Insecurity: No Food Insecurity (12/08/2023)  Housing: Low Risk  (12/08/2023)  Transportation Needs: No Transportation Needs (12/08/2023)  Utilities: Not At Risk (12/08/2023)  Alcohol Screen: Low Risk  (03/26/2023)  Depression (PHQ2-9): Low Risk  (11/22/2023)  Financial Resource Strain: Low Risk  (05/07/2023)   Received from [facility]  Physical Activity: Insufficiently Active (03/26/2023)  Social Connections: Moderately Integrated (12/08/2023)  Stress: No Stress Concern Present (03/26/2023)  Tobacco Use: Medium Risk (12/09/2023)  Health Literacy: Adequate Health Literacy (03/26/2023)   SDOH Interventions:     Readmission Risk Interventions     No data to display

## 2023-12-10 NOTE — Progress Notes (Addendum)
 [facility] PROGRESS NOTE   GRAM SIEDLECKI FMW:[id number] DOB: 09/19/1940 DOA: 12/08/2023  PCP: Severa Rock CHRISTELLA, FNP  Brief History: 83 y.o. male with medical history significant of hypertension, hyperlipidemia, GERD, CKD 2, CAD, BPH, atrial fibrillation, COPD, lumbar fracture presenting from outside hospital after presenting there with weakness and concern for sepsis.  He recently underwent lumbar surgery for compression fractures.  This was done on 11/20/2023. No source of infection was identified.  Patient did have a fall recently as well.  He was subsequently transferred to [facility] for further evaluation.  Imaging studies raise concern for perinephric hematoma.    Consultants: Urology.    Procedures: None    Subjective/Interval History: Patient complains of nausea and difficulty urinating this morning.  Denies any shortness of breath per se.  No chest pain.    Assessment/Plan:  Perinephric hematoma/right kidney laceration This is secondary to recent fall.  Large hematoma identified on imaging studies. Urology was consulted.  Patient's warfarin was discontinued. Hemodynamically he remained stable.  Hemoglobin has been stable. If patient has hemodynamic compromise or significant drop in hemoglobin we will need IR consult for consideration of embolization. Urology continues to follow. Bladder scan. Complains of nausea and so will be prescribed antiemetics.  Concern for sepsis/acute respiratory failure with hypoxia/hypotension It appears that most of the symptoms might have been due to acute blood loss from his perinephric hematoma.  No clear source of infection identified as yet.   Patient was empirically started on vancomycin and cefepime.  Currently just on cefepime. WBC noted to be elevated this morning.  He however is afebrile.   Follow-up blood culture report.   Continue cefepime for now.  Recheck WBC in the morning.  Recent lumbar surgery No infection  identified on examination or on imaging studies.  Neurosurgery was notified at admission.  Pending eval. L4 burst fracture identified on MRI. Patient denies any back pain currently.  Acute kidney injury on chronic kidney disease stage II Increasing creatinine probably due to perinephric hematoma.   Patient was hydrated with improvement in renal function noted today.  Monitor urine output.  Bladder scans.    Acute blood loss anemia Hemoglobin dropped to 9 from 14 recently.  Likely due to perinephric hematoma. Stable over the last 24 hours.  Chronic atrial fibrillation On metoprolol . Has been on warfarin for anticoagulation.  Did not want to switch to DOAC per patient's daughter Anticoagulation currently on hold due to bleeding.  Difficulties with swallowing Apparently failed swallow evaluation in the outside facility. Subsequently passed swallow eval here in the hospital.  Essential hypertension Metoprolol  will be continued.  Blood pressure noted to be elevated.  Can use as needed agents.  Hyperlipidemia On Repatha  as outpatient.  Coronary artery disease Stable.  Aspirin  is on hold.  COPD Saturations in the mid 90s on room air.  Continue with the nebulizer treatments.  Scattered wheezing appreciated on examination.   Does not typically use oxygen  at home. No acute findings noted on CT chest. CXR was done today which suggests pulm edema. IVF stopped. Lasix  x 1.  DVT Prophylaxis: Warfarin on hold.  SCDs Code Status: Full code Family Communication: Discussed with patient.  No family at bedside this morning. Disposition Plan: Has not been doing well at home in terms of mobility per daughter.  PT and OT eval.      Medications: Scheduled:  budesonide -glycopyrrolate -formoterol   2 puff Inhalation BID   ipratropium-albuterol   3 mL Nebulization QID   metoprolol   tartrate  25 mg Oral BID   pantoprazole   40 mg Oral Daily   sodium chloride  flush  3 mL Intravenous Q12H    Continuous:  ceFEPime (MAXIPIME) IV 2 g (12/09/23 2206)   lactated ringers      lactated ringers  75 mL/hr at 12/09/23 1846   PRN:acetaminophen  **OR** acetaminophen , albuterol , alum & mag hydroxide-simeth, HYDROcodone -acetaminophen , labetalol , ondansetron  (ZOFRAN ) IV, polyethylene glycol  Antibiotics: Anti-infectives (From admission, onward)    Start     Dose/Rate Route Frequency Ordered Stop   12/09/23 1000  vancomycin (VANCOCIN) IVPB 1000 mg/200 mL premix  Status:  Discontinued        1,000 mg 200 mL/hr over 60 Minutes Intravenous Every 24 hours 12/08/23 1647 12/09/23 1044   12/08/23 2200  ceFEPIme (MAXIPIME) 2 g in sodium chloride  0.9 % 100 mL IVPB        2 g 200 mL/hr over 30 Minutes Intravenous Every 12 hours 12/08/23 1646         Objective:  Vital Signs  Vitals:   12/10/23 0428 12/10/23 0600 12/10/23 0813 12/10/23 0843  BP: (!) 182/74 (!) 154/77 (!) 186/78   Pulse: 98  100   Resp: 18  18   Temp: 97.7 F (36.5 C)  97.9 F (36.6 C)   TempSrc: Oral  Oral   SpO2: 96%  95% 95%  Weight:      Height:        Intake/Output Summary (Last 24 hours) at 12/10/2023 0907 Last data filed at 12/10/2023 0827 Gross per 24 hour  Intake 1405.49 ml  Output 2250 ml  Net -844.51 ml   Filed Weights   12/08/23 1522  Weight: 76.9 kg    General appearance: Awake alert.  In no distress Resp: Tachypneic without use of accessory muscles.  Coarse breath sounds with scattered wheezing.  No definite crackles. Cardio: S1-S2 is normal regular.  No S3-S4.  No rubs murmurs or bruit GI: Abdomen is soft.  Mildly distended without any tenderness.  Bowel sounds present. Extremities: No edema.  Full range of motion of lower extremities. Neurologic: Alert and oriented x3.  No focal neurological deficits.     Lab Results:  Data Reviewed: I have personally reviewed following labs and reports of the imaging studies  CBC: Recent Labs  Lab 12/08/23 2030 12/09/23 0437 12/09/23 1221  12/10/23 0434  WBC 13.5* 15.1*  --  17.6*  HGB 9.4* 9.7* 9.9* 9.7*  HCT 30.1* 31.2* 31.5* 30.9*  MCV 95.6 95.7  --  95.7  PLT 363 429*  --  459*    Basic Metabolic Panel: Recent Labs  Lab 12/08/23 2027 12/08/23 2030 12/09/23 0437 12/10/23 0434  NA  --  135 135 136  K  --  3.8 4.0 4.3  CL  --  101 105 104  CO2  --  24 22 21*  GLUCOSE  --  136* 145* 153*  BUN  --  24* 25* 25*  CREATININE  --  1.48* 1.46* 1.29*  CALCIUM   --  8.8* 8.4* 8.7*  MG 2.0  --   --   --     GFR: Estimated Creatinine Clearance: 40.9 mL/min (A) (by C-G formula based on SCr of 1.29 mg/dL (H)).  Liver Function Tests: Recent Labs  Lab 12/09/23 0437  AST 67*  ALT 35  ALKPHOS 60  BILITOT 1.1  PROT 6.0*  ALBUMIN 2.2*    Coagulation Profile: Recent Labs  Lab 12/04/23 0932 12/08/23 2030  INR 3.8* 2.4*  Radiology Studies: CT ABDOMEN PELVIS W CONTRAST Result Date: 12/08/2023 CLINICAL DATA:  Abdominal pain.  Perinephric hematoma on chest CT. EXAM: CT ABDOMEN AND PELVIS WITH CONTRAST TECHNIQUE: Multidetector CT imaging of the abdomen and pelvis was performed using the standard protocol following bolus administration of intravenous contrast. RADIATION DOSE REDUCTION: This exam was performed according to the departmental dose-optimization program which includes automated exposure control, adjustment of the mA and/or kV according to patient size and/or use of iterative reconstruction technique. CONTRAST:  75mL OMNIPAQUE  IOHEXOL  350 MG/ML SOLN COMPARISON:  Chest CT earlier today.  Abdominal CT 09/24/2023. FINDINGS: Lower chest: Small bilateral pleural effusions stable since earlier chest CT. Dependent atelectasis in the right lower lobe. Coronary artery and aortic atherosclerosis. Hepatobiliary: No focal hepatic abnormality. Gallbladder unremarkable. Pancreas: No focal abnormality or ductal dilatation. Spleen: No focal abnormality.  Normal size. Adrenals/Urinary Tract: Adrenal glands and left kidney  unremarkable. Heterogeneous fluid collection noted surrounding the right kidney compatible with perinephric hematoma. Area of non enhancement in the mid to lower pole of the right kidney concerning for renal laceration. No active extravasation of contrast seen. No visible urinary extravasation. Urinary bladder unremarkable. Stomach/Bowel: Sigmoid diverticulosis. No active diverticulitis. Stomach and small bowel decompressed. No bowel obstruction. Vascular/Lymphatic: Aortoiliac atherosclerosis. No evidence of aneurysm or adenopathy. Reproductive: No visible focal abnormality. Other: Trace free fluid in the pelvis.  No free air. Musculoskeletal: Postoperative and degenerative changes in the lumbar spine. Compression fracture noted at L4 as seen on earlier lumbar spine MRI. IMPRESSION: Area of low-density and non enhancement in the mid to lower pole of the right kidney concerning for renal laceration. Associated large right perinephric hematoma which extends beyond the perirenal fascia into the retroperitoneum. Small bilateral pleural effusions. Coronary artery disease, aortic atherosclerosis. L4 compression fracture as described on earlier lumbar spine MRI. Sigmoid diverticulosis. Electronically Signed   By: Franky Crease M.D.   On: 12/08/2023 23:24   MR LUMBAR SPINE W WO CONTRAST Result Date: 12/08/2023 CLINICAL DATA:  Low back pain with infection suspected EXAM: MRI LUMBAR SPINE WITHOUT AND WITH CONTRAST TECHNIQUE: Multiplanar and multiecho pulse sequences of the lumbar spine were obtained without and with intravenous contrast. CONTRAST:  7mL GADAVIST  GADOBUTROL  1 MMOL/ML IV SOLN COMPARISON:  05/19/2021 lumbar spine MRI and 09/24/2023 CT abdomen pelvis FINDINGS: Segmentation:  Standard. Alignment:  Grade 1 retrolisthesis at L2-3 Vertebrae: Incomplete burst fracture of L4 with 50% height loss and 2 mm retropulsion. Mild edema throughout the bone marrow. There is posterior fusion at L3-L5 with bilateral L3 and L5  transpedicular screws at the left L4 transpedicular screw. There is no evidence of discitis-osteomyelitis. Conus medullaris and cauda equina: Conus extends to the L1 level. Conus and cauda equina appear normal. Paraspinal and other soft tissues: Enlarged appearance of the right kidney with abnormal surrounding signal is new compared to prior study. Disc levels: L1-L2: Small disc bulge. No spinal canal stenosis. No neural foraminal stenosis. L2-L3: Disc space narrowing with mild endplate spurring. No spinal canal stenosis. No neural foraminal stenosis. L3-L4: Posterior instrumented fusion. Small central protrusion. Right lateral recess narrowing without central spinal canal stenosis. No neural foraminal stenosis. L4-L5: Small disc bulge and endplate spurring. No spinal canal stenosis. No neural foraminal stenosis. L5-S1: Disc space narrowing with mild endplate spurring. No spinal canal stenosis. No neural foraminal stenosis. Visualized sacrum: Normal. IMPRESSION: 1. No evidence of discitis-osteomyelitis. 2. Incomplete burst fracture of L4 with 50% height loss and 2 mm retropulsion. Mild edema throughout the bone marrow suggests that  this fracture is subacute. 3. Posterior instrumented fusion at L3-L5 with right lateral recess narrowing at L3-L4. 4. Enlarged appearance of the right kidney with abnormal surrounding signal is new compared to prior study. Recommend further evaluation with CT of the abdomen and pelvis with contrast. Electronically Signed   By: Franky Stanford M.D.   On: 12/08/2023 19:41   CT CHEST WO CONTRAST Addendum Date: 12/08/2023 ADDENDUM REPORT: 12/08/2023 19:14 ADDENDUM: Critical Value/emergent results were called by telephone at the time of interpretation on 12/08/2023 at 7:11 pm to provider ALEXANDER MELVIN , who verbally acknowledged these results. Electronically Signed   By: Leita Birmingham M.D.   On: 12/08/2023 19:14   Result Date: 12/08/2023 CLINICAL DATA:  Respiratory illness, nondiagnostic  x-ray. EXAM: CT CHEST WITHOUT CONTRAST TECHNIQUE: Multidetector CT imaging of the chest was performed following the standard protocol without IV contrast. RADIATION DOSE REDUCTION: This exam was performed according to the departmental dose-optimization program which includes automated exposure control, adjustment of the mA and/or kV according to patient size and/or use of iterative reconstruction technique. COMPARISON:  04/30/2023, 09/24/2023. FINDINGS: Cardiovascular: The heart is normal in size and there is a trace pericardial effusion. Three-vessel coronary artery calcifications are noted. There is atherosclerotic calcification aorta without evidence of aneurysm. The pulmonary trunk is normal in caliber. Mediastinum/Nodes: A nonspecific prominent lymph node is present in the [place] off a GIA low fat measuring 1 cm. No axillary lymphadenopathy. Evaluation of the hila is limited due to lack of IV contrast. The trachea and esophagus are within normal limits. Lungs/Pleura: Centrilobular emphysematous changes are present in the lungs. Slightly hyperdense small pleural effusions are noted bilaterally, greater on the right than on the left. Atelectasis is noted bilaterally. A parenchymal band is noted in the posterior aspect of the left upper lobe and the previous area of nodularity is not well-defined, however measures 1.5 x 0.7 cm. No pneumothorax is seen. Examination is limited due to respiratory motion artifact. Upper Abdomen: The right kidney is enlarged with partially visualized surrounding hyperdense region which is new from the previous exam, query perinephric hematoma. Musculoskeletal: Degenerative changes are present in the thoracic spine. No acute fracture is seen. IMPRESSION: 1. Small hyperdense pleural effusions bilaterally, query small hemothorax versus infection. 2. Scattered atelectasis bilaterally. 3. Nodular region in the posterior aspect of the left upper lobe, not well delineated due to atelectasis  and respiratory motion artifact and may be decreased in size. Non emergent repeat evaluation is recommended. 4. Enlargement of the right kidney with peripheral hyperdense material partially visualized on exam, concerning for perinephric hematoma. Dedicated CT with contrast is recommended for further evaluation. 5. Emphysema. 6. Coronary artery calcifications. 7. Aortic atherosclerosis. Electronically Signed: By: Leita Birmingham M.D. On: 12/08/2023 18:54       LOS: 2 days   Orly Quimby Verdene  [facility] Pager on [email or web address]  12/10/2023, 9:07 AM

## 2023-12-10 NOTE — Progress Notes (Signed)
 PT Cancellation Note  Patient Details Name: [name] MRN: [id number] DOB: [birth year]   Cancelled Treatment:    Reason Eval/Treat Not Completed: Other (comment) Bedrest discontinued; however, discussed with RN and pt just given Ativan due to agitation.   Aleck [name], PT, DPT Acute Rehabilitation Services Office [phone]    [name] 12/10/2023, 2:11 PM

## 2023-12-10 NOTE — Progress Notes (Signed)
 Need to void sensation. PVR checked, PVR 0. NO indication for catheter drainage.

## 2023-12-11 ENCOUNTER — Other Ambulatory Visit (HOSPITAL_COMMUNITY): Payer: Self-pay

## 2023-12-11 ENCOUNTER — Telehealth (HOSPITAL_COMMUNITY): Payer: Self-pay | Admitting: Pharmacy Technician

## 2023-12-11 DIAGNOSIS — A419 Sepsis, unspecified organism: Secondary | ICD-10-CM | POA: Diagnosis not present

## 2023-12-11 DIAGNOSIS — J9601 Acute respiratory failure with hypoxia: Secondary | ICD-10-CM | POA: Diagnosis not present

## 2023-12-11 DIAGNOSIS — R6520 Severe sepsis without septic shock: Secondary | ICD-10-CM | POA: Diagnosis not present

## 2023-12-11 LAB — URINALYSIS, ROUTINE W REFLEX MICROSCOPIC
Bilirubin Urine: NEGATIVE
Glucose, UA: NEGATIVE mg/dL
Ketones, ur: NEGATIVE mg/dL
Leukocytes,Ua: NEGATIVE
Nitrite: NEGATIVE
Protein, ur: NEGATIVE mg/dL
Specific Gravity, Urine: 1.008 (ref 1.005–1.030)
pH: 5 (ref 5.0–8.0)

## 2023-12-11 LAB — CBC
HCT: 29.8 % — ABNORMAL LOW (ref 39.0–52.0)
Hemoglobin: 9.5 g/dL — ABNORMAL LOW (ref 13.0–17.0)
MCH: 29.9 pg (ref 26.0–34.0)
MCHC: 31.9 g/dL (ref 30.0–36.0)
MCV: 93.7 fL (ref 80.0–100.0)
Platelets: 457 10*3/uL — ABNORMAL HIGH (ref 150–400)
RBC: 3.18 MIL/uL — ABNORMAL LOW (ref 4.22–5.81)
RDW: 12.8 % (ref 11.5–15.5)
WBC: 20.5 10*3/uL — ABNORMAL HIGH (ref 4.0–10.5)
nRBC: 0.1 % (ref 0.0–0.2)

## 2023-12-11 LAB — BASIC METABOLIC PANEL WITH GFR
Anion gap: 11 (ref 5–15)
BUN: 29 mg/dL — ABNORMAL HIGH (ref 8–23)
CO2: 24 mmol/L (ref 22–32)
Calcium: 8.8 mg/dL — ABNORMAL LOW (ref 8.9–10.3)
Chloride: 99 mmol/L (ref 98–111)
Creatinine, Ser: 1.44 mg/dL — ABNORMAL HIGH (ref 0.61–1.24)
GFR, Estimated: 48 mL/min — ABNORMAL LOW (ref >60)
Glucose, Bld: 171 mg/dL — ABNORMAL HIGH (ref 70–99)
Potassium: 3.6 mmol/L (ref 3.5–5.1)
Sodium: 134 mmol/L — ABNORMAL LOW (ref 135–145)

## 2023-12-11 MED ORDER — BUDESONIDE 0.25 MG/2ML IN SUSP
0.2500 mg | Freq: Two times a day (BID) | RESPIRATORY_TRACT | Status: DC
  Administered 2023-12-11 – 2023-12-13 (×5): 0.25 mg via RESPIRATORY_TRACT
  Filled 2023-12-11 (×6): qty 2

## 2023-12-11 MED ORDER — PANTOPRAZOLE SODIUM 40 MG IV SOLR
40.0000 mg | Freq: Two times a day (BID) | INTRAVENOUS | Status: DC
  Administered 2023-12-11 – 2023-12-13 (×4): 40 mg via INTRAVENOUS
  Filled 2023-12-11 (×6): qty 10

## 2023-12-11 MED ORDER — PHENOL 1.4 % MT LIQD
1.0000 | OROMUCOSAL | Status: DC | PRN
  Filled 2023-12-11: qty 177

## 2023-12-11 MED ORDER — FUROSEMIDE 10 MG/ML IJ SOLN
20.0000 mg | Freq: Two times a day (BID) | INTRAMUSCULAR | Status: DC
  Administered 2023-12-11 – 2023-12-12 (×2): 20 mg via INTRAVENOUS
  Filled 2023-12-11 (×2): qty 4

## 2023-12-11 MED ORDER — GUAIFENESIN 100 MG/5ML PO LIQD
15.0000 mL | Freq: Three times a day (TID) | ORAL | Status: DC
  Administered 2023-12-11 – 2023-12-13 (×6): 15 mL via ORAL
  Filled 2023-12-11 (×8): qty 15

## 2023-12-11 MED ORDER — ARFORMOTEROL TARTRATE 15 MCG/2ML IN NEBU
15.0000 ug | INHALATION_SOLUTION | Freq: Two times a day (BID) | RESPIRATORY_TRACT | Status: DC
  Administered 2023-12-11 – 2023-12-13 (×5): 15 ug via RESPIRATORY_TRACT
  Filled 2023-12-11 (×6): qty 2

## 2023-12-11 MED ORDER — LINEZOLID 600 MG/300ML IV SOLN
600.0000 mg | Freq: Two times a day (BID) | INTRAVENOUS | Status: DC
  Administered 2023-12-11 – 2023-12-12 (×3): 600 mg via INTRAVENOUS
  Filled 2023-12-11 (×5): qty 300

## 2023-12-11 MED ORDER — QUETIAPINE FUMARATE 25 MG PO TABS
25.0000 mg | ORAL_TABLET | Freq: Every day | ORAL | Status: DC
  Administered 2023-12-11: 25 mg via ORAL
  Filled 2023-12-11 (×2): qty 1

## 2023-12-11 MED ORDER — FUROSEMIDE 10 MG/ML IJ SOLN
40.0000 mg | Freq: Once | INTRAMUSCULAR | Status: AC
  Administered 2023-12-11: 40 mg via INTRAVENOUS
  Filled 2023-12-11: qty 4

## 2023-12-11 MED ORDER — SODIUM CHLORIDE 3 % IN NEBU
4.0000 mL | INHALATION_SOLUTION | Freq: Every day | RESPIRATORY_TRACT | Status: DC
  Administered 2023-12-12 – 2023-12-13 (×2): 4 mL via RESPIRATORY_TRACT
  Filled 2023-12-11 (×4): qty 4

## 2023-12-11 MED ORDER — PHENOL 1.4 % MT LIQD
1.0000 | Freq: Three times a day (TID) | OROMUCOSAL | Status: DC
  Administered 2023-12-11 – 2023-12-12 (×5): 1 via OROMUCOSAL

## 2023-12-11 MED ORDER — MORPHINE SULFATE (PF) 2 MG/ML IV SOLN: 1.0000 mg | INTRAVENOUS | Status: DC | PRN

## 2023-12-11 MED ORDER — METRONIDAZOLE 500 MG/100ML IV SOLN
500.0000 mg | Freq: Two times a day (BID) | INTRAVENOUS | Status: DC
  Administered 2023-12-11 – 2023-12-13 (×5): 500 mg via INTRAVENOUS
  Filled 2023-12-11 (×5): qty 100

## 2023-12-11 NOTE — Progress Notes (Signed)
 PT Cancellation Note  Patient Details Name: DELNO BLAISDELL MRN: [id number] DOB: 11/29/1940   Cancelled Treatment:    Reason Eval/Treat Not Completed: Other (comment) (Pt receiving respiratory treatment. Acute PT to re-attempt as schedule allows.)  Orman Matsumura W, PT, DPT Secure Chat Preferred  Rehab Office [phone]  Kate BRAVO Wendolyn 12/11/2023, 11:50 AM

## 2023-12-11 NOTE — Care Management Important Message (Signed)
 Important Message  Patient Details  Name: [name] MRN: [id number] Date of Birth: [birth year]   Important Message Given:  Yes - Medicare IM     Claretta Deed 12/11/2023, 3:56 PM

## 2023-12-11 NOTE — Progress Notes (Signed)
  Subjective: NAEON. Pt resting in bed but is not communicating, reviewed case and plan with family.   Objective: Vital signs in last 24 hours: Temp:  [97.7 F (36.5 C)-98.6 F (37 C)] 97.8 F (36.6 C) (06/25 1153) Pulse Rate:  [81-111] 97 (06/25 1153) Resp:  [15-25] 20 (06/25 0820) BP: (98-149)/(53-125) 147/87 (06/25 1153) SpO2:  [93 %-100 %] 99 % (06/25 1153)  Intake/Output from previous day: 06/24 0701 - 06/25 0700 In: -  Out: 2550 [Urine:2550] Intake/Output this shift: Total I/O In: 200 [IV Piggyback:200] Out: -   Physical Exam:  General: Alert and oriented CV: RRR Lungs: Clear Abdomen: mildly distended, ND, midl tenderness, no flank pain of back pain    Lab Results: Recent Labs    12/09/23 1221 12/10/23 0434 12/11/23 0432  HGB 9.9* 9.7* 9.5*  HCT 31.5* 30.9* 29.8*   BMET Recent Labs    12/10/23 0434 12/11/23 0432  NA 136 134*  K 4.3 3.6  CL 104 99  CO2 21* 24  GLUCOSE 153* 171*  BUN 25* 29*  CREATININE 1.29* 1.44*  CALCIUM  8.7* 8.8*     Studies/Results: DG Abd Portable 1V Result Date: 12/10/2023 CLINICAL DATA:  [id number] Abdominal pain [id number] EXAM: PORTABLE ABDOMEN - 1 VIEW COMPARISON:  December 08, 2023 FINDINGS: Nonobstructive bowel gas pattern. No pneumoperitoneum. No organomegaly or radiopaque calculi. No acute fracture or destructive lesion.Lower lumbar fusion hardware. Bilateral hip osteoarthritis. IMPRESSION: Nonobstructive bowel gas pattern. Electronically Signed   By: Rogelia Myers M.D.   On: 12/10/2023 15:57   DG CHEST PORT 1 VIEW Result Date: 12/10/2023 CLINICAL DATA:  Dyspnea and abdominal pain. EXAM: PORTABLE CHEST 1 VIEW COMPARISON:  12/17/2022.  CT 12/08/2023. FINDINGS: The Chin overlies the apices. Midline trachea. Mild cardiomegaly. Atherosclerosis in the transverse aorta. Small layering right pleural effusion. No pneumothorax. Pulmonary interstitial prominence and indistinctness is mild, accentuated by AP portable technique. Bibasilar  airspace disease. IMPRESSION: Cardiomegaly with pulmonary interstitial thickening, favoring mild interstitial edema. Small layering right pleural effusion with bibasilar Airspace disease, likely atelectasis. Aortic Atherosclerosis (ICD10-I70.0). Electronically Signed   By: Rockey Kilts M.D.   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  This current injury was complicated by COPD as well his atrial fibrillation and recent back surgery.   Recommendations: Perinephric hematoma - H&H stable - continue to trend daily - H&H stable. May restart anticoagulation  - Transfuse as needed - can DC bed rest - If patient becomes unstable recommend stat IR consult for right kidney embolization.  - no planned urologic intervention  - urology will set up f/u in 3 months for repeat scan to evaluate resolution of hematoma and status of AML  Increase WBC: - if WBC continues to increase, consider rescan A/P to evaluated developing abscess     Atrial fibrillation - Currently in A-fib RVR - Pending hemoglobin address as needed   History of BPH: - 2550 UOP   GERD: - start PPI for symptoms   Pneumonia; - per Primary team   Urology to follow peripherally.    LOS: 3 days   Jackey Pea MD 12/11/2023, 11:58 AM [facility]

## 2023-12-11 NOTE — Plan of Care (Signed)
 0430 Still has wheezes on, re informed RT, Duoneb given, keep on O2 suppport 4lpm.  Problem: Education: Goal: Knowledge of General Education information will improve Description: Including pain rating scale, medication(s)/side effects and non-pharmacologic comfort measures Outcome: Progressing   Problem: Health Behavior/Discharge Planning: Goal: Ability to manage health-related needs will improve Outcome: Progressing   Problem: Clinical Measurements: Goal: Will remain free from infection Outcome: Progressing   Problem: Clinical Measurements: Goal: Diagnostic test results will improve Outcome: Progressing   Problem: Clinical Measurements: Goal: Respiratory complications will improve Outcome: Progressing   Problem: Activity: Goal: Risk for activity intolerance will decrease Outcome: Progressing   Problem: Nutrition: Goal: Adequate nutrition will be maintained Outcome: Progressing   Problem: Coping: Goal: Level of anxiety will decrease Outcome: Progressing   Problem: Pain Managment: Goal: General experience of comfort will improve and/or be controlled Outcome: Progressing   Problem: Safety: Goal: Ability to remain free from injury will improve Outcome: Progressing   Problem: Skin Integrity: Goal: Risk for impaired skin integrity will decrease Outcome: Progressing

## 2023-12-11 NOTE — Evaluation (Signed)
 Clinical/Bedside Swallow Evaluation Patient Details  Name: [name] MRN: [id number] Date of Birth: 26-Nov-1940  Today's Date: 12/11/2023 Time: SLP Start Time (ACUTE ONLY): 1543 SLP Stop Time (ACUTE ONLY): 1600 SLP Time Calculation (min) (ACUTE ONLY): 17 min  Past Medical History:  Past Medical History:  Diagnosis Date   Anxiety disorder    Benign prostatic hypertrophy    Bilateral cataracts    CAD (coronary artery disease)    Catheterization 2009, mild CAD   Cancer (HCC)    skin ca on arms   Carotid artery disease (HCC)    Doppler, September, 2011, less than 50% bilateral   COPD (chronic obstructive pulmonary disease) (HCC)    CVA (cerebral infarction)    Hospitalization.  MRI...    Left anterior cerebral artery territory, September, 2011... Coumadin  started   DYSLIPIDEMIA 11/01/2009   Qualifier: Diagnosis of  By: Micky, MD, [facility], Reyes Lenis    Ejection fraction    EF 60%   Ejection fraction    EF 55-60%, echo, September, 2011   Gastroesophageal reflux disease    History of deep venous thrombosis (DVT) of distal vein of left lower extremity 08/05/2020   History of left inguinal hernia    Hyperlipidemia    Persistent atrial fibrillation (HCC)        Pneumonia    x several   Ptosis of eyelid, left    Surgery in the past   Stroke (HCC) 02/2010   Sweating abnormality    Episodes of sweating appeared to be related to a delayed reaction to niacin   Vitamin D  insufficiency    Walker as ambulation aid    Past Surgical History:  Past Surgical History:  Procedure Laterality Date   APPENDECTOMY     CATARACT EXTRACTION, BILATERAL     COLONOSCOPY     INGUINAL HERNIA REPAIR Left 07/02/2019   INGUINAL HERNIA REPAIR Left 07/01/2020   Procedure: LEFT INGUINAL HERNIA REPAIR WITH MESH;  Surgeon: Kallie Manuelita BROCKS, MD;  Location: [facility];  Service: General;  Laterality: Left;   KNEE SURGERY Right 2019   torn ligament?    LAMINECTOMY WITH POSTERIOR LATERAL ARTHRODESIS LEVEL  1 N/A 11/20/2023   Procedure: Open reduction internal fixation lumbar four ,percutaneous pedicle screw placement with cement augmentation;  Surgeon: Gillie Duncans, MD;  Location: [facility] OR;  Service: Neurosurgery;  Laterality: N/A;   TRANSURETHRAL RESECTION OF PROSTATE N/A 08/19/2018   Procedure: TRANSURETHRAL RESECTION OF THE PROSTATE (TURP) OF PROSTATE ABCESS;  Surgeon: Alvaro Hummer, MD;  Location: [facility];  Service: Urology;  Laterality: N/A;   HPI:  83 y.o. male presents to [facility] 12/08/23 from outside hospital due to weakness, difficulty walking, and fall with unknown down time. Admitted with sepsis. CT chest showed R perinephric hematoma. CXR suggestive pulmonary edema. PHMx: lung disease, arrhythmia, DVT, h/o 2 falls (within the last year), ORIF L3-L5 (11/20/23), GERD    Assessment / Plan / Recommendation  Clinical Impression  Pt and his family endorse a burning sensation in his throat that has persisted since recent need for large bore NT suctioning, which when coupled with his reports of frequent reflux, is expected to be uncomfortable. He was agreeable to a limited evaluation without s/s of dysphagia throughout trials of thin liquids and solids. He coughs intermittently before and after all trials but did not cough after completion of the 3 oz water  test. Advised pt and his family to continue choosing softer solids from the menu but otherwise do not feel that SLP  f/u is necessary at this time. Will sign off. SLP Visit Diagnosis: Dysphagia, unspecified (R13.10)    Aspiration Risk  Mild aspiration risk    Diet Recommendation Regular;Thin liquid    Liquid Administration via: Cup;Straw Medication Administration: Whole meds with puree Supervision: Staff to assist with self feeding;Full supervision/cueing for compensatory strategies Compensations: Minimize environmental distractions;Slow rate;Small sips/bites Postural Changes: Seated upright at 90 degrees;Remain upright for at least 30 minutes after  po intake    Other  Recommendations Oral Care Recommendations: Oral care BID     Assistance Recommended at Discharge    Functional Status Assessment Patient has not had a recent decline in their functional status  Frequency and Duration            Prognosis Prognosis for improved oropharyngeal function: Good Barriers to Reach Goals: Time post onset      Swallow Study   General HPI: 83 y.o. male presents to [facility] 12/08/23 from outside hospital due to weakness, difficulty walking, and fall with unknown down time. Admitted with sepsis. CT chest showed R perinephric hematoma. CXR suggestive pulmonary edema. PHMx: lung disease, arrhythmia, DVT, h/o 2 falls (within the last year), ORIF L3-L5 (11/20/23), GERD Type of Study: Bedside Swallow Evaluation Previous Swallow Assessment: none in chart Diet Prior to this Study: Regular;Thin liquids (Level 0) Temperature Spikes Noted: No Respiratory Status: Nasal cannula History of Recent Intubation: No Behavior/Cognition: Alert;Cooperative Oral Cavity Assessment: Within Functional Limits Oral Care Completed by SLP: No Oral Cavity - Dentition: Adequate natural dentition Vision: Functional for self-feeding Self-Feeding Abilities: Able to feed self Patient Positioning: Upright in bed Baseline Vocal Quality: Normal Volitional Cough: Congested Volitional Swallow: Able to elicit    Oral/Motor/Sensory Function Overall Oral Motor/Sensory Function: Within functional limits   Ice Chips Ice chips: Not tested   Thin Liquid Thin Liquid: Within functional limits Presentation: Straw    Nectar Thick Nectar Thick Liquid: Not tested   Honey Thick Honey Thick Liquid: Not tested   Puree Puree: Not tested   Solid     Solid: Within functional limits Presentation: Carollynn Damien Blumenthal, M.A., CCC-SLP Speech Language Pathology, Acute Rehabilitation Services  Secure Chat preferred [phone]  12/11/2023,4:10 PM

## 2023-12-11 NOTE — Progress Notes (Signed)
 Pt shows shortness of breath, wheezing, vital signs checked, informed RT, given with albuterol , increased O2 to 4 lpm. Md Mansay made aware, to give Duoneb if still wheezing, Labetolol were also given as ordered.

## 2023-12-11 NOTE — Telephone Encounter (Signed)

## 2023-12-11 NOTE — Progress Notes (Addendum)
 PROGRESS NOTE    [name]  FMW:[id number] DOB: [birth year] DOA: 12/08/2023 PCP: Severa Rock CHRISTELLA, FNP   Brief Narrative: 83 year old with past medical history significant for hypertension, hyperlipidemia, GERD, CKD 2, sleep apnea, BPH, A-fib, COPD, lumbar fracture presents from outside hospital after presenting there with weakness and concern for sepsis.  He recently underwent lumbar surgery for compression fractures.  This was done on 6//2025.  No source of infection was identified patient recently had a fall recently.  He was subsequently transferred to [facility] for further evaluation.  Imaging study raise concern for perinephric hematoma.   Assessment & Plan:   Principal Problem:   Sepsis (HCC) Active Problems:   HLD (hyperlipidemia)   ATRIAL FIBRILLATION, CHRONIC   GERD   CAD (coronary artery disease)   COPD GOLD 2 with reversibility    BPH (benign prostatic hyperplasia)   Primary hypertension   Lumbar compression fracture (HCC)   Acute renal failure superimposed on stage 2 chronic kidney disease (HCC)   Anemia   Acute respiratory failure with hypoxia (HCC)   1-Perinephric hematoma/right kidney laceration: - This is secondary to recent fall.  Large hematoma identified on imaging study. - Urology was consulted.  Patient warfarin was discontinued. - Has remained stable. - If patient has hemodynamic compromise or significant drop in hemoglobin will need IR consult for consideration of embolization. -hb has remain stable.  -WBC continue to increase, plan to repeat CT tomorrow. Added flagyl, will add Linezolid as well.  Hold coumadin  until checking CT tomorrow.     Concern for sepsis/Acute respiratory failure with hypoxia hypotension: -Appears that most of his symptoms may have been due to acute blood close from perinephric hematoma.  No clear source for infection. Patient was empirically started on vancomycin and cefepime.  Currently just on cefepime. Worsening  leukocytosis, could be aspiration, vs perinephric abscess. Plan to check ct tomorrow. Will focus on Dyspnea today. Broaden antibiotics.  Develops Worsening dyspnea overnight. Currently on 4 L oxyge. Continue with Respiratory toilet. Will repeat IV lasix  this am and tonight.  Schedule Borvana, Pulmicort . Guifenesin.  Hypertonic saline.  PRB BIPAP. Would use BIPAP last resource, due to his secretions.   COPD Change Beztri to Duone, pulmicort  and brovana.   Recent lumbar surgery: No infection identified on examination or on imaging studies.  Neurosurgery was notified at admission  L4 burst fracture identified on MRI.  Per Dr Gillie, he saw patient 6/24, nothing to add  AKI on CKD stage II In setting perinephric hematoma.  Bladder scan.  Monitor on lasix .  Cr at 1.4   Acute Blood close anemia: In setting perinephric hematoma.  Hb stable today.   Chronic A-fib -Continue with Metoprolol  Holding coumadin  due to perinephric hematoma.   Dysphagia: Pass swallow eval here.  IV PPI BID>  Speech to follow up on patient.   Hypertension -continue with metoprolol .   Hyperlipidemia: Repahta out patient.  PAD Stable. Aspirin  is on hold.           Estimated body mass index is 27.36 kg/m as calculated from the following:   Height as of this encounter: 5' 6 (1.676 m).   Weight as of this encounter: 76.9 kg.   DVT prophylaxis: SCD Code Status: DNR, discussed with family.  Family Communication:family at bedside Disposition Plan:  Status is: Inpatient Remains inpatient appropriate because: management of Resp failure, perinephric abscess.     Consultants:  Urology   Procedures:    Antimicrobials:    Subjective: Events  from last night notice. He was having worsening Dyspnea and agitation.  He continue to have SOB, tachypnea. Denies abdominal pain, report back pain. Has been complaining of reflux.  Discussed with family and patient , he wouldn't want to be intubated  or resuscitated.    Objective: Vitals:   12/11/23 0120 12/11/23 0241 12/11/23 0321 12/11/23 0438  BP: (!) 149/99 98/60 107/60   Pulse: (!) 111 90 95   Resp: (!) 25 (!) 24 20   Temp: 98.6 F (37 C) 97.9 F (36.6 C) 98.1 F (36.7 C)   TempSrc: Oral Oral Oral   SpO2: 94% 97% 98% 98%  Weight:      Height:        Intake/Output Summary (Last 24 hours) at 12/11/2023 0720 Last data filed at 12/11/2023 0300 Gross per 24 hour  Intake --  Output 2550 ml  Net -2550 ml   Filed Weights   12/08/23 1522  Weight: 76.9 kg    Examination:  General exam: Appears calm and comfortable  Respiratory system: Tachypnea, BL ronchus.  Cardiovascular system: S1 & S2 heard, RRR. No JVD, murmurs, rubs, gallops or clicks. No pedal edema. Gastrointestinal system: Abdomen is nondistended, soft and nontender. No organomegaly or masses felt. Normal bowel sounds heard. Central nervous system: Alert and oriented. No focal neurological deficits. Extremities: Symmetric 5 x 5 power.   Data Reviewed: I have personally reviewed following labs and imaging studies  CBC: Recent Labs  Lab 12/08/23 2030 12/09/23 0437 12/09/23 1221 12/10/23 0434 12/11/23 0432  WBC 13.5* 15.1*  --  17.6* 20.5*  HGB 9.4* 9.7* 9.9* 9.7* 9.5*  HCT 30.1* 31.2* 31.5* 30.9* 29.8*  MCV 95.6 95.7  --  95.7 93.7  PLT 363 429*  --  459* 457*   Basic Metabolic Panel: Recent Labs  Lab 12/08/23 2027 12/08/23 2030 12/09/23 0437 12/10/23 0434 12/11/23 0432  NA  --  135 135 136 134*  K  --  3.8 4.0 4.3 3.6  CL  --  101 105 104 99  CO2  --  24 22 21* 24  GLUCOSE  --  136* 145* 153* 171*  BUN  --  24* 25* 25* 29*  CREATININE  --  1.48* 1.46* 1.29* 1.44*  CALCIUM   --  8.8* 8.4* 8.7* 8.8*  MG 2.0  --   --   --   --    GFR: Estimated Creatinine Clearance: 36.6 mL/min (A) (by C-G formula based on SCr of 1.44 mg/dL (H)). Liver Function Tests: Recent Labs  Lab 12/09/23 0437  AST 67*  ALT 35  ALKPHOS 60  BILITOT 1.1  PROT  6.0*  ALBUMIN 2.2*   No results for input(s): LIPASE, AMYLASE in the last 168 hours. No results for input(s): AMMONIA in the last 168 hours. Coagulation Profile: Recent Labs  Lab 12/04/23 0932 12/08/23 2030  INR 3.8* 2.4*   Cardiac Enzymes: No results for input(s): CKTOTAL, CKMB, CKMBINDEX, TROPONINI in the last 168 hours. BNP (last 3 results) No results for input(s): PROBNP in the last 8760 hours. HbA1C: No results for input(s): HGBA1C in the last 72 hours. CBG: No results for input(s): GLUCAP in the last 168 hours. Lipid Profile: No results for input(s): CHOL, HDL, LDLCALC, TRIG, CHOLHDL, LDLDIRECT in the last 72 hours. Thyroid  Function Tests: No results for input(s): TSH, T4TOTAL, FREET4, T3FREE, THYROIDAB in the last 72 hours. Anemia Panel: No results for input(s): VITAMINB12, FOLATE, FERRITIN, TIBC, IRON, RETICCTPCT in the last 72 hours. Sepsis Labs: No  results for input(s): PROCALCITON, LATICACIDVEN in the last 168 hours.  No results found for this or any previous visit (from the past 240 hours).       Radiology Studies: DG Abd Portable 1V Result Date: 12/10/2023 CLINICAL DATA:  [id number] Abdominal pain [id number] EXAM: PORTABLE ABDOMEN - 1 VIEW COMPARISON:  December 08, 2023 FINDINGS: Nonobstructive bowel gas pattern. No pneumoperitoneum. No organomegaly or radiopaque calculi. No acute fracture or destructive lesion.Lower lumbar fusion hardware. Bilateral hip osteoarthritis. IMPRESSION: Nonobstructive bowel gas pattern. Electronically Signed   By: Rogelia Myers M.D.   On: 12/10/2023 15:57   DG CHEST PORT 1 VIEW Result Date: 12/10/2023 CLINICAL DATA:  Dyspnea and abdominal pain. EXAM: PORTABLE CHEST 1 VIEW COMPARISON:  12/17/2022.  CT 12/08/2023. FINDINGS: The Chin overlies the apices. Midline trachea. Mild cardiomegaly. Atherosclerosis in the transverse aorta. Small layering right pleural effusion. No pneumothorax.  Pulmonary interstitial prominence and indistinctness is mild, accentuated by AP portable technique. Bibasilar airspace disease. IMPRESSION: Cardiomegaly with pulmonary interstitial thickening, favoring mild interstitial edema. Small layering right pleural effusion with bibasilar Airspace disease, likely atelectasis. Aortic Atherosclerosis (ICD10-I70.0). Electronically Signed   By: Rockey Kilts M.D.   On: 12/10/2023 13:04        Scheduled Meds:  budesonide -glycopyrrolate -formoterol   2 puff Inhalation BID   dextromethorphan-guaiFENesin   2 tablet Oral Daily   ipratropium-albuterol   3 mL Nebulization QID   metoprolol  tartrate  25 mg Oral BID   pantoprazole   40 mg Oral Daily   sodium chloride  flush  3 mL Intravenous Q12H   Continuous Infusions:  ceFEPime (MAXIPIME) IV 2 g (12/10/23 2158)   lactated ringers        LOS: 3 days    Time spent: 35 minutes    Zavannah Deblois A Khan Chura, MD [facility]   If 7PM-7AM, please contact night-coverage [email or web address]  12/11/2023, 7:20 AM

## 2023-12-11 NOTE — Evaluation (Signed)
 Physical Therapy Evaluation Patient Details Name: [name] MRN: [id number] DOB: 1941/03/10 Today's Date: 12/11/2023  History of Present Illness  83 y.o. male presents to [facility] 12/08/23 from outside hospital due to weakness, difficulty walking, and fall with unknown down time. Admitted with sepsis. CT chest showed R perinephric hematoma. CXR suggestive pulmonary edema. PHMx: lung disease, arrhythmia, DVT, h/o 2 falls (within the last year), ORIF L3-L5 (11/20/23)   Clinical Impression  Pt in bed upon arrival with family present and agreeable to PT eval. Prior to admit on 11/20/23, pt was independent for mobility with no AD. After d/c from the hospital, pt used a RW for mobility with increased weakness as time progressed. Recent fall with pt then needing assistance for all mobility. In today's session, pt required MaxA to roll and ModAx2 for sidelying<>sit transfer. Pt had a heavy R lateral and posterior lean requiring ModA to MaxA to maintain balance. Unable to fully assess LE strength/ROM as pt had difficulty following commands. Recommending post-acute rehab <3hrs to work towards independence with mobility. Pt would benefit from acute skilled PT with current functional limitations listed below (see PT Problem List). Acute PT to follow.         If plan is discharge home, recommend the following: A lot of help with walking and/or transfers;A lot of help with bathing/dressing/bathroom;Assistance with cooking/housework;Assist for transportation;Help with stairs or ramp for entrance;Direct supervision/assist for financial management;Direct supervision/assist for medications management   Can travel by private vehicle   No    Equipment Recommendations None recommended by PT     Functional Status Assessment Patient has had a recent decline in their functional status and demonstrates the ability to make significant improvements in function in a reasonable and predictable amount of time.      Precautions / Restrictions Precautions Precautions: Fall;Back Recall of Precautions/Restrictions: Impaired Precaution/Restrictions Comments: Able to follow throughout session despite pt being confused Required Braces or Orthoses: Spinal Brace Spinal Brace: Lumbar corset (to be applied in sitting, not present in the hospital. Family will bring for next session) Restrictions Weight Bearing Restrictions Per Provider Order: No      Mobility  Bed Mobility Overal bed mobility: Needs Assistance Bed Mobility: Rolling, Sidelying to Sit, Sit to Sidelying Rolling: Max assist, +2 for safety/equipment Sidelying to sit: Mod assist, +2 for physical assistance, +2 for safety/equipment     Sit to sidelying: Mod assist, +2 for physical assistance, +2 for safety/equipment General bed mobility comments: MaxA to roll using bed pad. ModAx2 to bring LE's off EOB and raise trunk. ModAx2  to return to supine    Transfers    General transfer comment: Deferred 2/2 safety and need for LSO for OOB mobility      Balance Overall balance assessment: Needs assistance, Mild deficits observed, not formally tested, History of Falls Sitting-balance support: Feet supported, Bilateral upper extremity supported Sitting balance-Leahy Scale: Poor Sitting balance - Comments: ModA to MaxA for R lateral and posterior lean. Able to sit for ~10 minutes Postural control: Right lateral lean, Posterior lean        Pertinent Vitals/Pain Pain Assessment Pain Assessment: Faces Faces Pain Scale: Hurts even more Pain Location: back, R leg Pain Descriptors / Indicators: Aching, Discomfort, Crying Pain Intervention(s): Limited activity within patient's tolerance, Monitored during session, Repositioned    Home Living Family/patient expects to be discharged to:: Private residence Living Arrangements: Spouse/significant other Available Help at Discharge: Family;Available 24 hours/day (supervision 24/7, physical assist  intermittently) Type of Home: House Home  Access: Stairs to enter Entrance Stairs-Rails: Right;Left;Can reach both Entrance Stairs-Number of Steps: 1 down no rail, 2 up 2 rails, 1 into house (hold onto door frame) Alternate Level Stairs-Number of Steps: 13 bilat rails to man cave in basement, has main level bedroom/bathroom or upstairs bedroom with 13 steps Home Layout: Multi-level;Able to live on main level with bedroom/bathroom Home Equipment: Rolling Walker (2 wheels);Cane - single point;Shower seat;BSC/3in1      Prior Function Prior Level of Function : Independent/Modified Independent    Mobility Comments: ModI prior to last admit 6/4. Has been having increased weakness since return home needing assistance with bed mobility, transfers and gait. Used RW for mobility.       Extremity/Trunk Assessment   Upper Extremity Assessment Upper Extremity Assessment: Defer to OT evaluation    Lower Extremity Assessment Lower Extremity Assessment: Difficult to assess due to impaired cognition (Able to follow commands to extend R LE, however, not able to follow commands for L LE movement.) RLE Deficits / Details: residual deficits from prior CVA    Cervical / Trunk Assessment Cervical / Trunk Assessment: Back Surgery  Communication   Communication Communication: No apparent difficulties    Cognition Arousal: Alert Behavior During Therapy: Anxious, Restless   PT - Cognitive impairments: Awareness, Memory, Attention, Problem solving, Safety/Judgement    PT - Cognition Comments: would repetitively ask questions about when he was leaving and what the plan was. Restless/anxious with high pain levels and wanting to get out of the bed Following commands: Impaired Following commands impaired: Follows one step commands inconsistently, Follows one step commands with increased time     Cueing Cueing Techniques: Verbal cues, Tactile cues     General Comments General comments (skin integrity,  edema, etc.): Family present during session and supportive.     PT Assessment Patient needs continued PT services  PT Problem List Decreased strength;Decreased activity tolerance;Decreased balance;Decreased mobility;Decreased knowledge of use of DME;Decreased safety awareness;Decreased knowledge of precautions;Pain;Decreased cognition       PT Treatment Interventions DME instruction;Gait training;Stair training;Functional mobility training;Therapeutic activities;Therapeutic exercise;Balance training;Patient/family education    PT Goals (Current goals can be found in the Care Plan section)  Acute Rehab PT Goals Patient Stated Goal: to get more therapy PT Goal Formulation: With patient/family Time For Goal Achievement: 12/25/23 Potential to Achieve Goals: Good    Frequency Min 2X/week        AM-PAC PT 6 Clicks Mobility  Outcome Measure Help needed turning from your back to your side while in a flat bed without using bedrails?: A Lot Help needed moving from lying on your back to sitting on the side of a flat bed without using bedrails?: Total Help needed moving to and from a bed to a chair (including a wheelchair)?: Total Help needed standing up from a chair using your arms (e.g., wheelchair or bedside chair)?: Total Help needed to walk in hospital room?: Total Help needed climbing 3-5 steps with a railing? : Total 6 Click Score: 7    End of Session   Activity Tolerance: Patient limited by pain Patient left: in bed;with call bell/phone within reach;with bed alarm set;with nursing/sitter in room;with family/visitor present Nurse Communication: Mobility status (RN present at end of session) PT Visit Diagnosis: Unsteadiness on feet (R26.81);Pain;Other abnormalities of gait and mobility (R26.89);Muscle weakness (generalized) (M62.81);History of falling (Z91.81) Pain - part of body:  (back)    Time: 8480-8454 PT Time Calculation (min) (ACUTE ONLY): 26 min   Charges:   PT  Evaluation $  PT Eval Low Complexity: 1 Low PT Treatments $Therapeutic Activity: 8-22 mins PT General Charges $$ ACUTE PT VISIT: 1 Visit       Kate ORN, PT, DPT Secure Chat Preferred  Rehab Office [phone]   Kate BRAVO Wendolyn 12/11/2023, 5:09 PM

## 2023-12-12 ENCOUNTER — Inpatient Hospital Stay (HOSPITAL_COMMUNITY)

## 2023-12-12 DIAGNOSIS — J9601 Acute respiratory failure with hypoxia: Secondary | ICD-10-CM | POA: Diagnosis not present

## 2023-12-12 DIAGNOSIS — R6520 Severe sepsis without septic shock: Secondary | ICD-10-CM | POA: Diagnosis not present

## 2023-12-12 DIAGNOSIS — A419 Sepsis, unspecified organism: Secondary | ICD-10-CM | POA: Diagnosis not present

## 2023-12-12 LAB — CBC
HCT: 29.5 % — ABNORMAL LOW (ref 39.0–52.0)
Hemoglobin: 9.3 g/dL — ABNORMAL LOW (ref 13.0–17.0)
MCH: 29.4 pg (ref 26.0–34.0)
MCHC: 31.5 g/dL (ref 30.0–36.0)
MCV: 93.4 fL (ref 80.0–100.0)
Platelets: 409 10*3/uL — ABNORMAL HIGH (ref 150–400)
RBC: 3.16 MIL/uL — ABNORMAL LOW (ref 4.22–5.81)
RDW: 12.7 % (ref 11.5–15.5)
WBC: 17.8 10*3/uL — ABNORMAL HIGH (ref 4.0–10.5)
nRBC: 0.1 % (ref 0.0–0.2)

## 2023-12-12 LAB — BASIC METABOLIC PANEL WITH GFR
Anion gap: 10 (ref 5–15)
BUN: 27 mg/dL — ABNORMAL HIGH (ref 8–23)
CO2: 27 mmol/L (ref 22–32)
Calcium: 8.4 mg/dL — ABNORMAL LOW (ref 8.9–10.3)
Chloride: 95 mmol/L — ABNORMAL LOW (ref 98–111)
Creatinine, Ser: 1.45 mg/dL — ABNORMAL HIGH (ref 0.61–1.24)
GFR, Estimated: 47 mL/min — ABNORMAL LOW (ref >60)
Glucose, Bld: 113 mg/dL — ABNORMAL HIGH (ref 70–99)
Potassium: 3.3 mmol/L — ABNORMAL LOW (ref 3.5–5.1)
Sodium: 132 mmol/L — ABNORMAL LOW (ref 135–145)

## 2023-12-12 LAB — CULTURE, BLOOD (ROUTINE X 2): Culture: NO GROWTH

## 2023-12-12 MED ORDER — LIDOCAINE 5 % EX PTCH
1.0000 | MEDICATED_PATCH | CUTANEOUS | Status: DC
  Administered 2023-12-12: 1 via TRANSDERMAL
  Filled 2023-12-12 (×3): qty 1

## 2023-12-12 MED ORDER — IOHEXOL 350 MG/ML SOLN
60.0000 mL | Freq: Once | INTRAVENOUS | Status: AC | PRN
  Administered 2023-12-12: 60 mL via INTRAVENOUS

## 2023-12-12 MED ORDER — POTASSIUM CHLORIDE 20 MEQ PO PACK
40.0000 meq | PACK | Freq: Two times a day (BID) | ORAL | Status: DC
  Administered 2023-12-12: 40 meq via ORAL
  Filled 2023-12-12 (×2): qty 2

## 2023-12-12 MED ORDER — SODIUM CHLORIDE 0.9 % IV BOLUS
250.0000 mL | INTRAVENOUS | Status: AC
  Administered 2023-12-12: 250 mL via INTRAVENOUS

## 2023-12-12 MED ORDER — MIDODRINE HCL 5 MG PO TABS
10.0000 mg | ORAL_TABLET | ORAL | Status: AC
  Filled 2023-12-12: qty 2

## 2023-12-12 NOTE — [place] FL2 (Signed)
 [place]  MEDICAID [id number] LEVEL OF CARE FORM     IDENTIFICATION  Patient Name: [name] Birthdate: 02/19/1941 Sex: male Admission Date (Current Location): 12/08/2023  [facility] and [place]:   ([place])   Facility and Address:  [facility], [address], [place], [place] [address]      Provider Number: [id number]  Attending Physician Name and Address:  Madelyne Owen LABOR, MD  Relative Name and Phone Number:       Current Level of Care: Hospital Recommended Level of Care: Skilled Nursing Facility Prior Approval Number:    Date Approved/Denied:   PASRR Number: Manual review  Discharge Plan: SNF    Current Diagnoses: Patient Active Problem List   Diagnosis Date Noted   Sepsis (HCC) 12/08/2023   Acute renal failure superimposed on stage 2 chronic kidney disease (HCC) 12/08/2023   Anemia 12/08/2023   Acute respiratory failure with hypoxia (HCC) 12/08/2023   Lumbar compression fracture (HCC) 08/08/2023   Vitamin B 12 deficiency 07/23/2023   Primary hypertension 04/21/2023   TIA (transient ischemic attack) 04/21/2023   Leg swelling 08/31/2022   Upper airway cough syndrome 04/13/2022   Chronic fatigue 09/19/2021   Solitary pulmonary nodule on lung CT 09/05/2021   Shuffling gait 08/05/2020   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   Meibomian gland dysfunction (MGD) of both eyes 05/08/2020   Cicatricial lagophthalmos of left upper eyelid 03/22/2020   Salzmann's nodular dystrophy of left eye 03/22/2020   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 03/22/2020   Vitamin D  insufficiency    Prediabetes 03/31/2018   BPH (benign prostatic hyperplasia) 02/07/2017   Chronic kidney disease (CKD), 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   Vertigo 02/07/2017   Chronic anticoagulation 02/27/2013   COPD GOLD 2 with reversibility  01/21/2013   Pulmonary nodules 12/19/2012   CAD (coronary artery disease)    HLD (hyperlipidemia) 11/01/2009   ATRIAL FIBRILLATION, CHRONIC  11/01/2009   GERD 11/01/2009    Orientation RESPIRATION BLADDER Height & Weight     Self, Place  Normal Incontinent Weight: 169 lb 8.5 oz (76.9 kg) Height:  5' 6 (167.6 cm)  BEHAVIORAL SYMPTOMS/MOOD NEUROLOGICAL BOWEL NUTRITION STATUS      Incontinent Diet (Regular)  AMBULATORY STATUS COMMUNICATION OF NEEDS Skin   Extensive Assist Verbally Normal                       Personal Care Assistance Level of Assistance  Bathing, Feeding, Dressing Bathing Assistance: Maximum assistance Feeding assistance: Maximum assistance Dressing Assistance: Maximum assistance     Functional Limitations Info  Sight, Hearing Sight Info: Impaired Hearing Info: Impaired      SPECIAL CARE FACTORS FREQUENCY  PT (By licensed PT), OT (By licensed OT)     PT Frequency: 5x/wk OT Frequency: 5x/wk            Contractures Contractures Info: Not present    Additional Factors Info  Code Status, Allergies Code Status Info: DNR Allergies Info: Asa (Aspirin ), Niacin, Pravastatin , Rosuvastatin , Simvastatin , Tramadol            Current Medications (12/12/2023):  This is the current hospital active medication list Current Facility-Administered Medications  Medication Dose Route Frequency Provider Last Rate Last Admin   acetaminophen  (TYLENOL ) tablet 650 mg  650 mg Oral Q6H PRN Seena Marsa NOVAK, MD   650 mg at 12/11/23 0056   Or   acetaminophen  (TYLENOL ) suppository 650 mg  650 mg Rectal Q6H PRN Seena Marsa NOVAK, MD  albuterol  (PROVENTIL ) (2.5 MG/3ML) 0.083% nebulizer solution 2.5 mg  2.5 mg Nebulization Q4H PRN Seena Marsa NOVAK, MD   2.5 mg at 12/11/23 0137   alum & mag hydroxide-simeth (MAALOX/MYLANTA) 200-200-20 MG/5ML suspension 30 mL  30 mL Oral Q4H PRN Mansy, Jan A, MD   30 mL at 12/11/23 0948   arformoterol (BROVANA) nebulizer solution 15 mcg  15 mcg Nebulization BID Regalado, Belkys A, MD   15 mcg at 12/12/23 0843   budesonide  (PULMICORT ) nebulizer solution 0.25 mg  0.25 mg  Nebulization BID Regalado, Belkys A, MD   0.25 mg at 12/12/23 0846   ceFEPIme (MAXIPIME) 2 g in sodium chloride  0.9 % 100 mL IVPB  2 g Intravenous Q12H Billy Rocky SAUNDERS, RPH 200 mL/hr at 12/12/23 0959 2 g at 12/12/23 0959   guaiFENesin  (ROBITUSSIN) 100 MG/5ML liquid 15 mL  15 mL Oral TID Regalado, Belkys A, MD   15 mL at 12/12/23 0946   HYDROcodone -acetaminophen  (NORCO/VICODIN) 5-325 MG per tablet 1 tablet  1 tablet Oral Q4H PRN Melvin, Alexander B, MD   1 tablet at 12/12/23 1243   labetalol  (NORMODYNE ) injection 20 mg  20 mg Intravenous Q3H PRN Mansy, Jan A, MD   20 mg at 12/11/23 0200   lidocaine  (LIDODERM ) 5 % 1 patch  1 patch Transdermal Q24H Regalado, Belkys A, MD   1 patch at 12/12/23 1131   linezolid (ZYVOX) IVPB 600 mg  600 mg Intravenous Q12H Regalado, Belkys A, MD 300 mL/hr at 12/12/23 1133 600 mg at 12/12/23 1133   metoprolol  tartrate (LOPRESSOR ) tablet 25 mg  25 mg Oral BID Melvin, Alexander B, MD   25 mg at 12/12/23 0946   metroNIDAZOLE (FLAGYL) IVPB 500 mg  500 mg Intravenous Q12H Regalado, Belkys A, MD 100 mL/hr at 12/12/23 0856 500 mg at 12/12/23 0856   morphine (PF) 2 MG/ML injection 1 mg  1 mg Intravenous Q4H PRN Regalado, Belkys A, MD       ondansetron  (ZOFRAN ) injection 4 mg  4 mg Intravenous Q6H PRN Krishnan, Gokul, MD   4 mg at 12/10/23 9073   pantoprazole  (PROTONIX ) injection 40 mg  40 mg Intravenous Q12H Regalado, Belkys A, MD   40 mg at 12/12/23 0946   phenol (CHLORASEPTIC) mouth spray 1 spray  1 spray Mouth/Throat PRN Regalado, Belkys A, MD       phenol (CHLORASEPTIC) mouth spray 1 spray  1 spray Mouth/Throat TID Regalado, Belkys A, MD   1 spray at 12/12/23 1036   polyethylene glycol (MIRALAX  / GLYCOLAX ) packet 17 g  17 g Oral Daily PRN Seena Marsa NOVAK, MD       potassium chloride  (KLOR-CON ) packet 40 mEq  40 mEq Oral BID Regalado, Belkys A, MD   40 mEq at 12/12/23 0946   QUEtiapine (SEROQUEL) tablet 25 mg  25 mg Oral QHS Regalado, Belkys A, MD   25 mg at 12/11/23 2150    sodium chloride  flush (NS) 0.9 % injection 3 mL  3 mL Intravenous Q12H Seena Marsa B, MD   3 mL at 12/12/23 1036   sodium chloride  HYPERTONIC 3 % nebulizer solution 4 mL  4 mL Nebulization Daily Regalado, Belkys A, MD   4 mL at 12/12/23 0839   tiZANidine  (ZANAFLEX ) tablet 4 mg  4 mg Oral Q8H PRN Krishnan, Gokul, MD   4 mg at 12/10/23 1057     Discharge Medications: Please see discharge summary for a list of discharge medications.  Relevant Imaging Results:  Relevant Lab Results:  Additional Information SS#: [id number]  Almarie CHRISTELLA Goodie, LCSW

## 2023-12-12 NOTE — Evaluation (Signed)
 Occupational Therapy Evaluation Patient Details Name: [name] MRN: [id number] DOB: 1940-10-20 Today's Date: 12/12/2023   History of Present Illness   83 y.o. male [facility] 12/08/23 from outside hospital due to weakness, difficulty walking, and fall with unknown down time. Admitted with sepsis. CT chest showed R perinephric hematoma. CXR suggestive pulmonary edema. PHMx: lung disease, arrhythmia, DVT, h/o 2 falls (within the last year), ORIF L3-L5 (11/20/23)     Clinical Impressions PTA patient needing assist for Adls and mobility since back surgery (11/20/23), prior to surgery reports independent.  Admitted for above and presents with problem list below, including back pain, weakness, impaired balance, impaired cognition and decreased activity tolerance.  He requires max-total assist +2 for bed mobility, mod-max assist to maintain sitting balance at EOB statically, and mod to total assist for all ADLs.  Based on performance today, believe patient will best benefit from continued OT services acutely and after dc at inpatient setting with <3hrs/day to optimize independence, safety with ADLs and mobility.     If plan is discharge home, recommend the following:   Two people to help with walking and/or transfers;Two people to help with bathing/dressing/bathroom;Assistance with cooking/housework;Assistance with feeding;Direct supervision/assist for medications management;Direct supervision/assist for financial management;Assist for transportation;Help with stairs or ramp for entrance;Supervision due to cognitive status     Functional Status Assessment   Patient has had a recent decline in their functional status and demonstrates the ability to make significant improvements in function in a reasonable and predictable amount of time.     Equipment Recommendations   Other (comment) (defer)     Recommendations for Other Services         Precautions/Restrictions    Precautions Precautions: Fall;Back Precaution Booklet Issued: Yes (comment) Recall of Precautions/Restrictions: Impaired Required Braces or Orthoses: Spinal Brace Spinal Brace: Lumbar corset;Applied in sitting position Restrictions Weight Bearing Restrictions Per Provider Order: No     Mobility Bed Mobility Overal bed mobility: Needs Assistance Bed Mobility: Rolling, Sidelying to Sit, Sit to Sidelying Rolling: Max assist, +2 for physical assistance Sidelying to sit: Max assist, +2 for physical assistance     Sit to sidelying: Total assist, +2 for physical assistance General bed mobility comments: pt attempted to assist with RUE, but once on side requires support to release grasp due to resisting upright position    Transfers Overall transfer level: Needs assistance Equipment used: 2 person hand held assist Transfers: Sit to/from Stand Sit to Stand: Total assist, +2 physical assistance           General transfer comment: attempted stand from EOB x 2, total assist for minimal ascend of hips from EOB      Balance Overall balance assessment: Needs assistance, Mild deficits observed, not formally tested, History of Falls Sitting-balance support: Feet supported, Bilateral upper extremity supported Sitting balance-Leahy Scale: Poor Sitting balance - Comments: ModA to MaxA for L lateral and posterior lean. Postural control: Left lateral lean, Posterior lean   Standing balance-Leahy Scale: Zero                             ADL either performed or assessed with clinical judgement   ADL Overall ADL's : Needs assistance/impaired     Grooming: Maximal assistance;Sitting           Upper Body Dressing : Total assistance;Sitting   Lower Body Dressing: Total assistance;+2 for physical assistance;Bed level     Toilet Transfer Details (  indicate cue type and reason): deferred         Functional mobility during ADLs: +2 for physical assistance;Maximal  assistance;Total assistance       Vision Patient Visual Report: No change from baseline Vision Assessment?: No apparent visual deficits Additional Comments: did not assess formally     Perception         Praxis         Pertinent Vitals/Pain Pain Assessment Pain Assessment: Faces Faces Pain Scale: Hurts even more Pain Location: back Pain Descriptors / Indicators: Aching, Discomfort Pain Intervention(s): Limited activity within patient's tolerance, Monitored during session, Repositioned     Extremity/Trunk Assessment Upper Extremity Assessment Upper Extremity Assessment: Generalized weakness;RUE deficits/detail;LUE deficits/detail RUE Deficits / Details: grossly 3-/5, cannot take resistance.  able to hold cup but difficulty bring to mouth RUE Sensation: WNL RUE Coordination: decreased fine motor;decreased gross motor LUE Deficits / Details: edema in UE (from IV per family), weaker than R UE (2/5) difficulty maintaining grasp functionally but able to squeeze 3/5 LUE Sensation: decreased proprioception LUE Coordination: decreased fine motor;decreased gross motor   Lower Extremity Assessment Lower Extremity Assessment: Defer to PT evaluation   Cervical / Trunk Assessment Cervical / Trunk Assessment: Back Surgery   Communication Communication Communication: No apparent difficulties   Cognition Arousal: Alert Behavior During Therapy: Flat affect Cognition: Cognition impaired   Orientation impairments: Situation (time NT) Awareness: Online awareness impaired, Intellectual awareness intact Memory impairment (select all impairments): Short-term memory, Working [employer] fast streamer, Non-declarative long-term memory, Geneticist, molecular long-term memory Attention impairment (select first level of impairment): Focused attention Executive functioning impairment (select all impairments): Organization, Initiation, Sequencing, Reasoning, Problem solving OT - Cognition Comments: pt oriented to self,  slow processing and requires increased time for all activities.  Demonstrates difficulty with simple tasks, sequencing multiple step ADLs (bringing cup to mouth and drinking from a straw)                 Following commands: Impaired Following commands impaired: Follows one step commands inconsistently, Follows one step commands with increased time     Cueing  General Comments   Cueing Techniques: Verbal cues;Tactile cues  Family present during session and supportive.   Exercises     Shoulder Instructions      Home Living Family/patient expects to be discharged to:: Private residence Living Arrangements: Spouse/significant other Available Help at Discharge: Family;Available 24 hours/day (supervision 24/7) Type of Home: House Home Access: Stairs to enter [employer] of Steps: 1 down no rail, 2 up 2 rails, 1 into house (hold onto door frame) Entrance Stairs-Rails: Right;Left;Can reach both Home Layout: Multi-level;Able to live on main level with bedroom/bathroom Alternate Level Stairs-Number of Steps: 13 bilat rails to man cave in basement, has main level bedroom/bathroom or upstairs bedroom with 13 steps   Bathroom Shower/Tub: Producer, television/film/video: Handicapped height     Home Equipment: Agricultural consultant (2 wheels);Cane - single point;Shower seat;BSC/3in1          Prior Functioning/Environment Prior Level of Function : Independent/Modified Independent             Mobility Comments: ModI prior to last admit 6/4. Has been having increased weakness since return home needing assistance with bed mobility, transfers and gait. Used RW for mobility. ADLs Comments: indepedent ADls prior to back surgery, needing assist since    OT Problem List: Decreased strength;Decreased range of motion;Pain;Decreased activity tolerance;Impaired balance (sitting and/or standing);Decreased coordination;Decreased cognition;Decreased safety awareness;Decreased knowledge  of use of DME  or AE;Decreased knowledge of precautions   OT Treatment/Interventions: Self-care/ADL training;Therapeutic exercise;DME and/or AE instruction;Therapeutic activities;Patient/family education;Balance training;Energy conservation;Cognitive remediation/compensation      OT Goals(Current goals can be found in the care plan section)   Acute Rehab OT Goals Patient Stated Goal: none stated OT Goal Formulation: Patient unable to participate in goal setting Time For Goal Achievement: 12/26/23 Potential to Achieve Goals: Fair   OT Frequency:  Min 2X/week    Co-evaluation              AM-PAC OT 6 Clicks Daily Activity     Outcome Measure Help from another person eating meals?: A Lot Help from another person taking care of personal grooming?: A Lot Help from another person toileting, which includes using toliet, bedpan, or urinal?: Total Help from another person bathing (including washing, rinsing, drying)?: Total Help from another person to put on and taking off regular upper body clothing?: Total Help from another person to put on and taking off regular lower body clothing?: Total 6 Click Score: 8   End of Session Equipment Utilized During Treatment: Gait belt;Back brace Nurse Communication: Mobility status;Precautions;Need for lift equipment  Activity Tolerance: Patient limited by pain;Patient limited by fatigue Patient left: in bed;with call bell/phone within reach;with bed alarm set;with family/visitor present;with SCD's reapplied  OT Visit Diagnosis: Unsteadiness on feet (R26.81);Other abnormalities of gait and mobility (R26.89);Pain;Muscle weakness (generalized) (M62.81);Other symptoms and signs involving cognitive function Pain - part of body:  (back)                Time: 8875-8843 OT Time Calculation (min): 32 min Charges:  OT General Charges $OT Visit: 1 Visit OT Evaluation $OT Eval Moderate Complexity: 1 Mod  Etta NOVAK, OT Acute Rehabilitation  Services Office [phone] Secure Chat Preferred    Etta GORMAN Hope 12/12/2023, 1:18 PM

## 2023-12-12 NOTE — Progress Notes (Signed)
  Subjective: NAEON. Pt was slightly more verbal but is still decompensated  Objective: Vital signs in last 24 hours: Temp:  [97.8 F (36.6 C)-98.7 F (37.1 C)] 98.7 F (37.1 C) (06/26 0758) Pulse Rate:  [91-104] 101 (06/26 0758) Resp:  [16-20] 16 (06/26 0758) BP: (112-156)/(53-100) 130/67 (06/26 0758) SpO2:  [97 %-100 %] 98 % (06/26 0846)  Intake/Output from previous day: 06/25 0701 - 06/26 0700 In: 740 [P.O.:540; IV Piggyback:200] Out: 2250 [Urine:2250] Intake/Output this shift: Total I/O In: -  Out: 900 [Urine:900]  Physical Exam:  General: Alert and oriented CV: no cyanosis Lungs: Clear Abdomen: mildly distended, ND, midl tenderness, no flank pain of back pain  GU: Purewick in place. Clear yellow urine   Lab Results: Recent Labs    12/10/23 0434 12/11/23 0432 12/12/23 0439  HGB 9.7* 9.5* 9.3*  HCT 30.9* 29.8* 29.5*   BMET Recent Labs    12/11/23 0432 12/12/23 0439  NA 134* 132*  K 3.6 3.3*  CL 99 95*  CO2 24 27  GLUCOSE 171* 113*  BUN 29* 27*  CREATININE 1.44* 1.45*  CALCIUM  8.8* 8.4*     Studies/Results: No results found.   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  This current injury was complicated by COPD as well his atrial fibrillation and recent back surgery.   Recommendations: Perinephric hematoma - H&H stable - minor daily downtrend. Attributable to acute illness and blood draws. Volume not concerning for ongoing bleeding, but will await pending CT.  - H&H stable. May restart anticoagulation  - Transfuse as needed - can DC bed rest - If patient becomes unstable recommend stat IR consult for right kidney embolization.  - no planned urologic intervention  - urology will set up f/u in 3 months for repeat scan to evaluate resolution of hematoma  and status of AML  Increase WBC: - small interval improvement in leukocytosis. Still markedly elevated. Primary team has ordered CT A/P   History of BPH: - 2550 UOP   GERD: - start PPI for symptoms   Pneumonia; - per Primary team - audible ronchi  Urology to follow peripherally.    LOS: 4 days   Jackey Pea MD 12/12/2023, 12:01 PM [facility]

## 2023-12-12 NOTE — Progress Notes (Signed)
 SLP Cancellation Note  Patient Details Name: ZYAIR RUSSI MRN: [id number] DOB: 03/21/1941   Cancelled treatment:       Reason Eval/Treat Not Completed: Discussed with MD, who reports no change in function that warrants a re-evaluation. Pt was assessed previous date with functional appearing performance and was advised to continue ordering softer solids due to reported burning throughout his pharynx. Ongoing SLP f/u is not needed, will sign off but please re-consult if acute needs arise.    Damien Blumenthal, M.A., CCC-SLP Speech Language Pathology, Acute Rehabilitation Services  Secure Chat preferred [phone]  12/12/2023, 10:34 AM

## 2023-12-12 NOTE — Progress Notes (Signed)
 2006 Patient with low BP 62/43 automatic, 68/44 manual HR 78. MD notified new orders received STAT midodrine 10 mg and 250 ml NS bolus, metoprolol  held. Bolus given, unable to give po midodrine as patient became very lethargic. Provider notified and arrived quickly at the bedside. BP improving following bolus.  2242 BP 118/62 HR 90. Patient is opening his eyes briefly and following simple commands, but is drowsy and falls back asleep. Will continue to monitor.

## 2023-12-12 NOTE — Progress Notes (Addendum)
 Hypotensive with MAP of 51.  P.o. Lopressor  held.  Stat midodrine 10 mg x 1 and NS bolus 250 cc x 1 ordered.  Will continue to closely monitor blood pressures and treat as indicated.  No charge note.

## 2023-12-12 NOTE — TOC Progression Note (Signed)
 Transition of Care [facility]) - Progression Note    Patient Details  Name: [name] MRN: [id number] Date of Birth: 05-20-1941  [facility] CM/SW Contact  Almarie CHRISTELLA Goodie, [place] Phone Number: 12/12/2023, 2:11 PM  Clinical Narrative:   CSW spoke with patient's spouse, Orlean and mertha Pin, about recommendation for SNF placement. Patient's family in agreement, they would not be able to take him home in his current condition. Patient lives in [place]  but all of his doctors are in [place], so the family would actually prefer a SNF in the [place] area so that it is closer for any follow up appointments. Referral completed and faxed out, will provide bed offers.    Expected Discharge Plan: Skilled Nursing Facility Barriers to Discharge: Continued Medical Work up, English as a second language teacher  Expected Discharge Plan and Services     Post Acute Care Choice: Skilled Nursing Facility Living arrangements for the past 2 months: Single Family Home                                       Social Determinants of Health (SDOH) Interventions SDOH Screenings   Food Insecurity: No Food Insecurity (12/08/2023)  Housing: Low Risk  (12/08/2023)  Transportation Needs: No Transportation Needs (12/08/2023)  Utilities: Not At Risk (12/08/2023)  Alcohol Screen: Low Risk  (03/26/2023)  Depression (PHQ2-9): Low Risk  (11/22/2023)  Financial Resource Strain: Low Risk  (05/07/2023)   Received from [facility]  Physical Activity: Insufficiently Active (03/26/2023)  Social Connections: Moderately Integrated (12/08/2023)  Stress: No Stress Concern Present (03/26/2023)  Tobacco Use: Medium Risk (12/09/2023)  Health Literacy: Adequate Health Literacy (03/26/2023)    Readmission Risk Interventions     No data to display

## 2023-12-12 NOTE — Progress Notes (Signed)
 Physical Therapy Treatment Patient Details Name: [name] MRN: [id number] DOB: 09-Oct-1940 Today's Date: 12/12/2023   History of Present Illness 83 y.o. male presents to [facility] 12/08/23 from outside hospital due to weakness, difficulty walking, and fall with unknown down time. Admitted with sepsis. CT chest showed R perinephric hematoma. CXR suggestive pulmonary edema. PHMx: lung disease, arrhythmia, DVT, h/o 2 falls (within the last year), ORIF L3-L5 (11/20/23)    PT Comments  Pt in bed upon arrival and agreeable to PT session. Pt continues to require MaxAx2 to TotalAx2 for bed mobility. Worked on dynamic seated balance with ModA/MaxA needed to support left lateral and posterior lean. When corrected to midline, pt reported an increase in back pain. Attempted sit to stand transfer with TotalAx2 and use of bed pad, however, pt only able to slightly clear bottom. Continue to recommend <3hrs post acute rehab. Acute PT to follow.    If plan is discharge home, recommend the following: A lot of help with walking and/or transfers;A lot of help with bathing/dressing/bathroom;Assistance with cooking/housework;Assist for transportation;Help with stairs or ramp for entrance;Direct supervision/assist for financial management;Direct supervision/assist for medications management   Can travel by private vehicle     No  Equipment Recommendations  None recommended by PT       Precautions / Restrictions Precautions Precautions: Fall;Back Precaution Booklet Issued: Yes (comment) Recall of Precautions/Restrictions: Impaired Required Braces or Orthoses: Spinal Brace Spinal Brace: Lumbar corset;Applied in sitting position Restrictions Weight Bearing Restrictions Per Provider Order: No     Mobility  Bed Mobility Overal bed mobility: Needs Assistance Bed Mobility: Rolling, Sidelying to Sit, Sit to Sidelying Rolling: Max assist, +2 for physical assistance Sidelying to sit: Max assist, +2 for physical  assistance     Sit to sidelying: Total assist, +2 for physical assistance General bed mobility comments: pt attempted to assist with RUE, but once on side requires support to release grasp due to resisting upright position    Transfers Overall transfer level: Needs assistance Equipment used: 2 person hand held assist Transfers: Sit to/from Stand Sit to Stand: Total assist, +2 physical assistance      General transfer comment: attempted stand from EOB x 2, total assist with use of bed pads for minimal ascend of hips from EOB    Ambulation/Gait    General Gait Details: Unable this date     Balance Overall balance assessment: Needs assistance, Mild deficits observed, not formally tested, History of Falls Sitting-balance support: Feet supported, Bilateral upper extremity supported Sitting balance-Leahy Scale: Poor Sitting balance - Comments: ModA to MaxA for L lateral and posterior lean. Postural control: Left lateral lean, Posterior lean   Standing balance-Leahy Scale: Zero       Communication Communication Communication: No apparent difficulties  Cognition Arousal: Alert Behavior During Therapy: Flat affect      Following commands: Impaired Following commands impaired: Follows one step commands inconsistently, Follows one step commands with increased time    Cueing Cueing Techniques: Verbal cues, Tactile cues  Exercises      General Comments General comments (skin integrity, edema, etc.): Family present and supportive      Pertinent Vitals/Pain Pain Assessment Pain Assessment: Faces Faces Pain Scale: Hurts even more Pain Location: back Pain Descriptors / Indicators: Aching, Discomfort Pain Intervention(s): Limited activity within patient's tolerance, Monitored during session, Repositioned    Home Living Family/patient expects to be discharged to:: Private residence Living Arrangements: Spouse/significant other Available Help at Discharge: Family;Available 24  hours/day (supervision 24/7) Type of  Home: House Home Access: Stairs to enter Entrance Stairs-Rails: Right;Left;Can reach both Entrance Stairs-Number of Steps: 1 down no rail, 2 up 2 rails, 1 into house (hold onto door frame) Alternate Level Stairs-Number of Steps: 13 bilat rails to man cave in basement, has main level bedroom/bathroom or upstairs bedroom with 13 steps Home Layout: Multi-level;Able to live on main level with bedroom/bathroom Home Equipment: Rolling Walker (2 wheels);Cane - single point;Shower seat;BSC/3in1      Prior Function            PT Goals (current goals can now be found in the care plan section) Acute Rehab PT Goals PT Goal Formulation: With patient/family Time For Goal Achievement: 12/25/23 Potential to Achieve Goals: Good Progress towards PT goals: Progressing toward goals    Frequency    Min 2X/week      PT Plan      Co-evaluation   Reason for Co-Treatment: For patient/therapist safety;To address functional/ADL transfers PT goals addressed during session: Proper use of DME;Balance;Mobility/safety with mobility;Strengthening/ROM OT goals addressed during session: ADL's and self-care;Strengthening/ROM      AM-PAC PT 6 Clicks Mobility   Outcome Measure  Help needed turning from your back to your side while in a flat bed without using bedrails?: Total Help needed moving from lying on your back to sitting on the side of a flat bed without using bedrails?: Total Help needed moving to and from a bed to a chair (including a wheelchair)?: Total Help needed standing up from a chair using your arms (e.g., wheelchair or bedside chair)?: Total Help needed to walk in hospital room?: Total Help needed climbing 3-5 steps with a railing? : Total 6 Click Score: 6    End of Session Equipment Utilized During Treatment: Back brace;Oxygen  Activity Tolerance: Patient limited by pain Patient left: in bed;with call bell/phone within reach;with bed alarm  set;with family/visitor present Nurse Communication: Mobility status PT Visit Diagnosis: Unsteadiness on feet (R26.81);Pain;Other abnormalities of gait and mobility (R26.89);Muscle weakness (generalized) (M62.81);History of falling (Z91.81) Pain - part of body:  (back)     Time: 8875-8843 PT Time Calculation (min) (ACUTE ONLY): 32 min  Charges:    $Therapeutic Activity: 8-22 mins PT General Charges $$ ACUTE PT VISIT: 1 Visit                     Kate ORN, PT, DPT Secure Chat Preferred  Rehab Office [phone]    Kate BRAVO [name] 12/12/2023, 1:23 PM

## 2023-12-12 NOTE — Plan of Care (Signed)
 Pt had been restless after the Neb but he remain calm and comfortable after giving Seroquel. O2 support decreased to 3lpm by RT.  Pt is asleep and not on respiratory distress as of this time.   Problem: Health Behavior/Discharge Planning: Goal: Ability to manage health-related needs will improve Outcome: Progressing   Problem: Clinical Measurements: Goal: Will remain free from infection Outcome: Progressing   Problem: Clinical Measurements: Goal: Respiratory complications will improve Outcome: Progressing   Problem: Activity: Goal: Risk for activity intolerance will decrease Outcome: Progressing   Problem: Nutrition: Goal: Adequate nutrition will be maintained Outcome: Progressing   Problem: Coping: Goal: Level of anxiety will decrease Outcome: Progressing   Problem: Elimination: Goal: Will not experience complications related to bowel motility Outcome: Progressing   Problem: Pain Managment: Goal: General experience of comfort will improve and/or be controlled Outcome: Progressing   Problem: Safety: Goal: Ability to remain free from injury will improve Outcome: Progressing   Problem: Skin Integrity: Goal: Risk for impaired skin integrity will decrease Outcome: Progressing

## 2023-12-12 NOTE — Plan of Care (Signed)
 Will go into the room and pt will say he cannot breath and will be sitting up in the bed calmly talking with family and when I go to assess him, he wants me to wait until he has finished talking with family but will be satting in the high 90's and is on O2.    Problem: Clinical Measurements: Goal: Respiratory complications will improve Outcome: Progressing   Problem: Clinical Measurements: Goal: Cardiovascular complication will be avoided Outcome: Progressing   Problem: Activity: Goal: Risk for activity intolerance will decrease Outcome: Progressing   Problem: Clinical Measurements: Goal: Will remain free from infection Outcome: Progressing   Problem: Nutrition: Goal: Adequate nutrition will be maintained Outcome: Progressing   Problem: Coping: Goal: Level of anxiety will decrease Outcome: Progressing

## 2023-12-12 NOTE — Progress Notes (Signed)
 PROGRESS NOTE    [name]  FMW:[id number] DOB: 1940-08-17 DOA: 12/08/2023 PCP: Severa Rock CHRISTELLA, FNP   Brief Narrative: 83 year old with past medical history significant for hypertension, hyperlipidemia, GERD, CKD 2, sleep apnea, BPH, A-fib, COPD, lumbar fracture presents from outside hospital after presenting there with weakness and concern for sepsis.  He recently underwent lumbar surgery for compression fractures.  This was done on 6//2025.  No source of infection was identified patient recently had a fall recently.  He was subsequently transferred to [facility] for further evaluation.  Imaging study raise concern for perinephric hematoma.   Assessment & Plan:   Principal Problem:   Sepsis (HCC) Active Problems:   HLD (hyperlipidemia)   ATRIAL FIBRILLATION, CHRONIC   GERD   CAD (coronary artery disease)   COPD GOLD 2 with reversibility    BPH (benign prostatic hyperplasia)   Primary hypertension   Lumbar compression fracture (HCC)   Acute renal failure superimposed on stage 2 chronic kidney disease (HCC)   Anemia   Acute respiratory failure with hypoxia (HCC)   1-Perinephric hematoma/right kidney laceration: - This is secondary to recent fall.  Large hematoma identified on imaging study. - Urology was consulted.  Patient warfarin was discontinued. - Has remained stable. - If patient has hemodynamic compromise or significant drop in hemoglobin will need IR consult for consideration of embolization. -hb has remain stable.  -WBC continue to increase, plan to repeat CT today.  -Continue with  flagyl, Linezolid and Cefepime.  Hold coumadin  until checking CT today     Concern for sepsis/Acute respiratory failure with hypoxia hypotension: -Appears that most of his symptoms may have been due to acute blood close from perinephric hematoma.  No clear source for infection. -Patient was empirically started on vancomycin and cefepime.   -Worsening leukocytosis, could be  aspiration, vs perinephric abscess. Plan to check ct today. -continue with IV antibiotics. -6/25: Develops Worsening dyspnea overnight. Currently on 4 L oxyge. Continue with Respiratory toilet.  -Schedule Borvana, Pulmicort . Guifenesin.  -Hypertonic saline nebulizer.   Plan to hold further lasix  , plan to get CT scan with contrast.  Appears less tachypnea. Continue with respiratory toilet.   COPD Continue with Duo-neb, pulmicort  and brovana.   Recent lumbar surgery: No infection identified on examination or on imaging studies.  Neurosurgery was notified at admission  L4 burst fracture identified on MRI.  Per Dr Gillie, he saw patient 6/24, nothing to add Will order lidocaine  patch.   AKI on CKD stage II In setting perinephric hematoma.  Bladder scan.  Monitor on lasix .  Cr at 1.4 stable.   Acute Blood close anemia: In setting perinephric hematoma.  Hb stable  Chronic A-fib -Continue with Metoprolol  Holding coumadin  due to perinephric hematoma.   Dysphagia: Pass swallow eval here.  IV PPI BID>  Speech to follow up on patient.   Hypertension -continue with metoprolol .   Hyperlipidemia: Repahta out patient.  PAD Stable. Aspirin  is on hold.           Estimated body mass index is 27.36 kg/m as calculated from the following:   Height as of this encounter: 5' 6 (1.676 m).   Weight as of this encounter: 76.9 kg.   DVT prophylaxis: SCD Code Status: DNR, discussed with family.  Family Communication: family at bedside Disposition Plan:  Status is: Inpatient Remains inpatient appropriate because: management of Resp failure, perinephric abscess.     Consultants:  Urology   Procedures:    Antimicrobials:  Subjective: He had good night sleep, Seroquel help last night.  Still having back pain.  Appears to be breathing better.  Discussed with family about getting CT scan, there is some risk for worsening renal function.   Objective: Vitals:    12/12/23 0758 12/12/23 0839 12/12/23 0843 12/12/23 0846  BP: 130/67     Pulse: (!) 101     Resp: 16     Temp: 98.7 F (37.1 C)     TempSrc: Oral     SpO2: 100% 98% 98% 98%  Weight:      Height:        Intake/Output Summary (Last 24 hours) at 12/12/2023 1344 Last data filed at 12/12/2023 1133 Gross per 24 hour  Intake 60 ml  Output 3150 ml  Net -3090 ml   Filed Weights   12/08/23 1522  Weight: 76.9 kg    Examination:  General exam: NAD Respiratory system: Appears more comfortable. Less ronchus Cardiovascular system: S 1, S 2 RRR Gastrointestinal system: BS present, soft, nt Central nervous system: Alert Extremities:no edema   Data Reviewed: I have personally reviewed following labs and imaging studies  CBC: Recent Labs  Lab 12/08/23 2030 12/09/23 0437 12/09/23 1221 12/10/23 0434 12/11/23 0432 12/12/23 0439  WBC 13.5* 15.1*  --  17.6* 20.5* 17.8*  HGB 9.4* 9.7* 9.9* 9.7* 9.5* 9.3*  HCT 30.1* 31.2* 31.5* 30.9* 29.8* 29.5*  MCV 95.6 95.7  --  95.7 93.7 93.4  PLT 363 429*  --  459* 457* 409*   Basic Metabolic Panel: Recent Labs  Lab 12/08/23 2027 12/08/23 2030 12/09/23 0437 12/10/23 0434 12/11/23 0432 12/12/23 0439  NA  --  135 135 136 134* 132*  K  --  3.8 4.0 4.3 3.6 3.3*  CL  --  101 105 104 99 95*  CO2  --  24 22 21* 24 27  GLUCOSE  --  136* 145* 153* 171* 113*  BUN  --  24* 25* 25* 29* 27*  CREATININE  --  1.48* 1.46* 1.29* 1.44* 1.45*  CALCIUM   --  8.8* 8.4* 8.7* 8.8* 8.4*  MG 2.0  --   --   --   --   --    GFR: Estimated Creatinine Clearance: 36.4 mL/min (A) (by C-G formula based on SCr of 1.45 mg/dL (H)). Liver Function Tests: Recent Labs  Lab 12/09/23 0437  AST 67*  ALT 35  ALKPHOS 60  BILITOT 1.1  PROT 6.0*  ALBUMIN 2.2*   No results for input(s): LIPASE, AMYLASE in the last 168 hours. No results for input(s): AMMONIA in the last 168 hours. Coagulation Profile: Recent Labs  Lab 12/08/23 2030  INR 2.4*   Cardiac  Enzymes: No results for input(s): CKTOTAL, CKMB, CKMBINDEX, TROPONINI in the last 168 hours. BNP (last 3 results) No results for input(s): PROBNP in the last 8760 hours. HbA1C: No results for input(s): HGBA1C in the last 72 hours. CBG: No results for input(s): GLUCAP in the last 168 hours. Lipid Profile: No results for input(s): CHOL, HDL, LDLCALC, TRIG, CHOLHDL, LDLDIRECT in the last 72 hours. Thyroid  Function Tests: No results for input(s): TSH, T4TOTAL, FREET4, T3FREE, THYROIDAB in the last 72 hours. Anemia Panel: No results for input(s): VITAMINB12, FOLATE, FERRITIN, TIBC, IRON, RETICCTPCT in the last 72 hours. Sepsis Labs: No results for input(s): PROCALCITON, LATICACIDVEN in the last 168 hours.  Recent Results (from the past 240 hours)  Culture, blood (Routine X 2) w Reflex to ID Panel  Status: None (Preliminary result)   Collection Time: 12/11/23  7:49 AM   Specimen: BLOOD LEFT ARM  Result Value Ref Range Status   Specimen Description BLOOD LEFT ARM  Final   Special Requests   Final    BOTTLES DRAWN AEROBIC AND ANAEROBIC Blood Culture adequate volume   Culture   Final    NO GROWTH < 24 HOURS Performed at [facility], [address] [place], [place] [address]    Report Status PENDING  Incomplete  Culture, blood (Routine X 2) w Reflex to ID Panel     Status: None (Preliminary result)   Collection Time: 12/11/23  7:56 AM   Specimen: BLOOD RIGHT ARM  Result Value Ref Range Status   Specimen Description BLOOD RIGHT ARM  Final   Special Requests   Final    BOTTLES DRAWN AEROBIC AND ANAEROBIC Blood Culture adequate volume   Culture   Final    NO GROWTH 1 DAY Performed at [facility], [address] [place], [place] [address]    Report Status PENDING  Incomplete         Radiology Studies: No results found.       Scheduled Meds:  arformoterol  15 mcg Nebulization BID   budesonide  (PULMICORT )  nebulizer solution  0.25 mg Nebulization BID   guaiFENesin   15 mL Oral TID   lidocaine   1 patch Transdermal Q24H   metoprolol  tartrate  25 mg Oral BID   pantoprazole  (PROTONIX ) IV  40 mg Intravenous Q12H   phenol  1 spray Mouth/Throat TID   potassium chloride   40 mEq Oral BID   QUEtiapine  25 mg Oral QHS   sodium chloride  flush  3 mL Intravenous Q12H   sodium chloride  HYPERTONIC  4 mL Nebulization Daily   Continuous Infusions:  ceFEPime (MAXIPIME) IV 2 g (12/12/23 0959)   linezolid (ZYVOX) IV 600 mg (12/12/23 1133)   metronidazole 500 mg (12/12/23 0856)     LOS: 4 days    Time spent: 35 minutes    Cuyler Vandyken A Damario Gillie, MD [facility]   If 7PM-7AM, please contact night-coverage [email or web address]  12/12/2023, 1:44 PM

## 2023-12-13 DIAGNOSIS — J9601 Acute respiratory failure with hypoxia: Secondary | ICD-10-CM | POA: Diagnosis not present

## 2023-12-13 DIAGNOSIS — Z7189 Other specified counseling: Secondary | ICD-10-CM

## 2023-12-13 DIAGNOSIS — Z515 Encounter for palliative care: Secondary | ICD-10-CM

## 2023-12-13 DIAGNOSIS — J449 Chronic obstructive pulmonary disease, unspecified: Secondary | ICD-10-CM | POA: Diagnosis not present

## 2023-12-13 DIAGNOSIS — R6520 Severe sepsis without septic shock: Secondary | ICD-10-CM | POA: Diagnosis not present

## 2023-12-13 DIAGNOSIS — A419 Sepsis, unspecified organism: Secondary | ICD-10-CM | POA: Diagnosis not present

## 2023-12-13 LAB — BASIC METABOLIC PANEL WITH GFR
Anion gap: 12 (ref 5–15)
BUN: 28 mg/dL — ABNORMAL HIGH (ref 8–23)
CO2: 28 mmol/L (ref 22–32)
Calcium: 8.7 mg/dL — ABNORMAL LOW (ref 8.9–10.3)
Chloride: 95 mmol/L — ABNORMAL LOW (ref 98–111)
Creatinine, Ser: 1.5 mg/dL — ABNORMAL HIGH (ref 0.61–1.24)
GFR, Estimated: 45 mL/min — ABNORMAL LOW (ref >60)
Glucose, Bld: 120 mg/dL — ABNORMAL HIGH (ref 70–99)
Potassium: 3.5 mmol/L (ref 3.5–5.1)
Sodium: 135 mmol/L (ref 135–145)

## 2023-12-13 LAB — CBC
HCT: 31.7 % — ABNORMAL LOW (ref 39.0–52.0)
Hemoglobin: 10.2 g/dL — ABNORMAL LOW (ref 13.0–17.0)
MCH: 30.1 pg (ref 26.0–34.0)
MCHC: 32.2 g/dL (ref 30.0–36.0)
MCV: 93.5 fL (ref 80.0–100.0)
Platelets: 470 10*3/uL — ABNORMAL HIGH (ref 150–400)
RBC: 3.39 MIL/uL — ABNORMAL LOW (ref 4.22–5.81)
RDW: 12.8 % (ref 11.5–15.5)
WBC: 18.4 10*3/uL — ABNORMAL HIGH (ref 4.0–10.5)
nRBC: 0 % (ref 0.0–0.2)

## 2023-12-13 MED ORDER — SCOPOLAMINE 1 MG/3DAYS TD PT72
1.0000 | MEDICATED_PATCH | TRANSDERMAL | Status: DC
  Administered 2023-12-13: 1.5 mg via TRANSDERMAL
  Filled 2023-12-13: qty 1

## 2023-12-13 MED ORDER — ONDANSETRON HCL 4 MG/2ML IJ SOLN: 4.0000 mg | Freq: Four times a day (QID) | INTRAMUSCULAR | Status: DC | PRN

## 2023-12-13 MED ORDER — POLYVINYL ALCOHOL 1.4 % OP SOLN: 1.0000 [drp] | Freq: Four times a day (QID) | OPHTHALMIC | Status: DC | PRN

## 2023-12-13 MED ORDER — ONDANSETRON 4 MG PO TBDP: 4.0000 mg | ORAL_TABLET | Freq: Four times a day (QID) | ORAL | Status: DC | PRN

## 2023-12-13 MED ORDER — OXYCODONE HCL 5 MG PO TABS: 5.0000 mg | ORAL_TABLET | ORAL | Status: DC | PRN

## 2023-12-13 MED ORDER — HALOPERIDOL LACTATE 2 MG/ML PO CONC: 2.0000 mg | Freq: Four times a day (QID) | ORAL | Status: DC | PRN

## 2023-12-13 MED ORDER — HALOPERIDOL 1 MG PO TABS
2.0000 mg | ORAL_TABLET | Freq: Four times a day (QID) | ORAL | Status: DC | PRN
Start: 2023-12-13 — End: 2023-12-15

## 2023-12-13 MED ORDER — LORAZEPAM 2 MG/ML IJ SOLN
1.0000 mg | INTRAMUSCULAR | Status: DC | PRN
  Filled 2023-12-13: qty 1

## 2023-12-13 MED ORDER — DIPHENHYDRAMINE HCL 50 MG/ML IJ SOLN: 25.0000 mg | INTRAMUSCULAR | Status: DC | PRN

## 2023-12-13 MED ORDER — BIOTENE DRY MOUTH MT LIQD: 15.0000 mL | Freq: Two times a day (BID) | OROMUCOSAL | Status: DC

## 2023-12-13 MED ORDER — LORAZEPAM 1 MG PO TABS: 1.0000 mg | ORAL_TABLET | ORAL | Status: DC | PRN

## 2023-12-13 MED ORDER — HALOPERIDOL LACTATE 5 MG/ML IJ SOLN: 2.0000 mg | INTRAMUSCULAR | Status: DC | PRN

## 2023-12-13 MED ORDER — GLYCOPYRROLATE 0.2 MG/ML IJ SOLN
0.2000 mg | INTRAMUSCULAR | Status: DC | PRN
  Administered 2023-12-14 (×2): 0.2 mg via INTRAVENOUS
  Filled 2023-12-13 (×2): qty 1

## 2023-12-13 MED ORDER — GLYCOPYRROLATE 0.2 MG/ML IJ SOLN
0.2000 mg | INTRAMUSCULAR | Status: DC | PRN
Start: 2023-12-13 — End: 2023-12-15

## 2023-12-13 MED ORDER — GLYCOPYRROLATE 1 MG PO TABS
1.0000 mg | ORAL_TABLET | ORAL | Status: DC | PRN
Start: 2023-12-13 — End: 2023-12-15

## 2023-12-13 MED ORDER — LORAZEPAM 2 MG/ML PO CONC: 1.0000 mg | ORAL | Status: DC | PRN

## 2023-12-13 MED ORDER — HYDROMORPHONE HCL 1 MG/ML IJ SOLN
0.5000 mg | INTRAMUSCULAR | Status: DC | PRN
  Administered 2023-12-13: 1 mg via INTRAVENOUS
  Administered 2023-12-14: 2 mg via INTRAVENOUS
  Administered 2023-12-14: 1 mg via INTRAVENOUS
  Administered 2023-12-14 (×2): 2 mg via INTRAVENOUS
  Administered 2023-12-14: 0.5 mg via INTRAVENOUS
  Administered 2023-12-14: 2 mg via INTRAVENOUS
  Administered 2023-12-14: 0.5 mg via INTRAVENOUS
  Filled 2023-12-13 (×2): qty 1
  Filled 2023-12-13: qty 2
  Filled 2023-12-13: qty 1
  Filled 2023-12-13: qty 2
  Filled 2023-12-13 (×2): qty 1
  Filled 2023-12-13: qty 2
  Filled 2023-12-13 (×2): qty 1

## 2023-12-13 NOTE — TOC Transition Note (Signed)
 Transition of Care [facility]) - Discharge Note   Patient Details  Name: [name] MRN: [id number] Date of Birth: [birth year]  Transition of Care [facility]) CM/SW Contact:  Almarie CHRISTELLA [place], [place] Phone Number: 12/13/2023, 3:27 PM   Clinical Narrative:   CSW updated by RN that patient's family has elected to transition to comfort care with hospital death expected at this time. No further TOC Needs at this time. Please reconsult if needs arise.    Final next level of care:  (hospital death expected) Barriers to Discharge: Barriers Resolved   Patient Goals and CMS Choice Patient states their goals for this hospitalization and ongoing recovery are:: patient unable to participate in goal setting, not fully oriented CMS Medicare.gov Compare Post Acute Care list provided to:: Patient Represenative (must comment) Choice offered to / list presented to : Spouse, Adult Children [place] ownership interest in [facility].provided to:: Spouse    Discharge Placement                       Discharge Plan and Services Additional resources added to the After Visit Summary for       Post Acute Care Choice: Skilled Nursing Facility                               Social Drivers of Health (SDOH) Interventions SDOH Screenings   Food Insecurity: No Food Insecurity (12/08/2023)  Housing: Low Risk  (12/08/2023)  Transportation Needs: No Transportation Needs (12/08/2023)  Utilities: Not At Risk (12/08/2023)  Alcohol Screen: Low Risk  (03/26/2023)  Depression (PHQ2-9): Low Risk  (11/22/2023)  Financial Resource Strain: Low Risk  (05/07/2023)   Received from [facility]  Physical Activity: Insufficiently Active (03/26/2023)  Social Connections: Moderately Integrated (12/08/2023)  Stress: No Stress Concern Present (03/26/2023)  Tobacco Use: Medium Risk (12/09/2023)  Health Literacy: Adequate Health Literacy (03/26/2023)     Readmission Risk Interventions     No data to display

## 2023-12-13 NOTE — Consult Note (Signed)
 Palliative Care Consult Note                                  Date: 12/13/2023   Patient Name: [name]  DOB: 1941-02-14  MRN: [id number]  Age / Sex: 83 y.o., male  PCP: Severa Rock CHRISTELLA, FNP Referring Physician: Madelyne Owen LABOR, MD  Reason for Consultation: Establishing goals of care  HPI/Patient Profile: 83 y.o. male  with past medical history of hypertension, hyperlipidemia, GERD, CKD 2, sleep apnea, BPH, A-fib, COPD, lumbar fracture (ORIF L3-L5 on 11/20/23) admitted on 12/08/2023 from outside hospital with weakness and concern for sepsis. He had also had a recent fall.  CT chest showed R perinephric hematoma. CXR suggestive pulmonary edema.    Past Medical History:  Diagnosis Date   Anxiety disorder    Benign prostatic hypertrophy    Bilateral cataracts    CAD (coronary artery disease)    Catheterization 2009, mild CAD   Cancer (HCC)    skin ca on arms   Carotid artery disease (HCC)    Doppler, September, 2011, less than 50% bilateral   COPD (chronic obstructive pulmonary disease) (HCC)    CVA (cerebral infarction)    Hospitalization.  MRI...    Left anterior cerebral artery territory, September, 2011... Coumadin  started   DYSLIPIDEMIA 11/01/2009   Qualifier: Diagnosis of  By: Micky, MD, [facility], Reyes Lenis    Ejection fraction    EF 60%   Ejection fraction    EF 55-60%, echo, September, 2011   Gastroesophageal reflux disease    History of deep venous thrombosis (DVT) of distal vein of left lower extremity 08/05/2020   History of left inguinal hernia    Hyperlipidemia    Persistent atrial fibrillation (HCC)        Pneumonia    x several   Ptosis of eyelid, left    Surgery in the past   Stroke (HCC) 02/2010   Sweating abnormality    Episodes of sweating appeared to be related to a delayed reaction to niacin   Vitamin D  insufficiency    Walker as ambulation aid     Subjective:   I have reviewed medical  records including EPIC notes, labs and imaging, received update from primary team, assessed the patient and then met with the patient and his family to discuss diagnosis prognosis, GOC, EOL wishes, disposition and options.  I introduced Palliative Medicine as specialized medical care for people living with serious illness. It focuses on providing relief from symptoms and stress of a serious illness. The goal is to improve quality of life for both the patient and the family.  Today's Discussion: Patient sitting up in bed with family at bedside (wife Orlean, daughters Randine and Madelin, son Camellia, and granddaughter Alan) and on the phone (daughter Arland and son Wolm). We discussed the patient's chronic conditions and acute hospitalization. We discuss the patient's perinephritic hematoma, respiratory failure, and overall failure to thrive. The patient and his family share they believe they are ready to make him comfortable.  The patient has been married to his wife Orlean for 57 years. They have five children. 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 In retirement he has enjoyed tinkering in the garage and working on his tractor. The patient's family shared that the patient had a general decrease in functional status since January. Over this  time he was using a walker and eventually a wheelchair because he was unable to continue moving his legs. His appetite gradually decreased.  A discussion was had today regarding advanced directives. Confirmed DNR status. We discussed the difference between an aggressive medical intervention path and a comfort care path. The patient and family would like to transition to a comfort focussed path. We discussed that the patient would no longer receive aggressive medical interventions such as continuous vital signs, lab work, antibiotics, or medications not focused on comfort. We discussed gradually weaning oxygen . All care  would focus on how the patient is looking and feeling. This would include management of any symptoms that may cause discomfort, pain, shortness of breath, cough, nausea, agitation, anxiety, and/or secretions etc. Symptoms would be managed with medications and other non-pharmacological interventions. Family verbalized understanding and appreciation. They would prefer to keep the patient in the hospital for end of life care.  Discussed the importance of continued conversation with family and the medical providers regarding overall plan of care and treatment options, ensuring decisions are within the context of the patient's values and GOCs.  Questions and concerns were addressed. Hard Choices booklet left for review. The family was encouraged to call with questions or concerns. PMT will continue to support holistically.  Review of Systems  Constitutional:  Positive for activity change, appetite change and fatigue.  Respiratory:  Positive for cough.   Musculoskeletal:  Positive for back pain.    Objective:   Primary Diagnoses: Present on Admission:  Primary hypertension  Lumbar compression fracture (HCC)  HLD (hyperlipidemia)  GERD  COPD GOLD 2 with reversibility   CAD (coronary artery disease)  BPH (benign prostatic hyperplasia)  ATRIAL FIBRILLATION, CHRONIC  Sepsis (HCC)   Physical Exam Vitals reviewed.  Constitutional:      General: He is not in acute distress.    Appearance: He is ill-appearing.  HENT:     Head: Normocephalic and atraumatic.     Vital Signs:  BP (!) 160/81 (BP Location: Left Arm)   Pulse (!) 103   Temp 99.2 F (37.3 C) (Oral)   Resp (!) 22   Ht 5' 6 (1.676 m)   Wt 76.9 kg   SpO2 98%   BMI 27.36 kg/m   Palliative Assessment/Data: 30%    Advanced Care Planning:   Existing Vynca/ACP Documentation: None  Primary Decision Maker: PATIENT  Code Status/Advance Care Planning: DNR   Assessment & Plan:   SUMMARY OF RECOMMENDATIONS    DNR Transitioned to full comfort care Continued PMT support  Symptom Management:  Dilaudid  PRN for pain/air hunger/comfort Robinul  PRN for excessive secretions Ativan  PRN for agitation/anxiety Zofran  PRN for nausea Liquifilm tears PRN for dry eyes Haldol PRN for agitation/anxiety May have comfort feeding Comfort cart for family Unrestricted visitations in the setting of EOL (per policy) No escalation of oxygen   Discussed with: bedside nurses and Dr. Madelyne  Time Total: 75 minutes    Thank you for allowing us  to participate in the care of TRYGVE THAL PMT will continue to support holistically.   Signed by: Stephane Palin, NP Palliative Medicine Team  Team Phone # [phone] (Nights/Weekends)  12/13/2023, 11:06 AM

## 2023-12-13 NOTE — Progress Notes (Signed)
 PROGRESS NOTE    [name]  FMW:[id number] DOB: 1940-09-21 DOA: 12/08/2023 PCP: Severa Rock CHRISTELLA, FNP   Brief Narrative: 83 year old with past medical history significant for hypertension, hyperlipidemia, GERD, CKD 2, sleep apnea, BPH, A-fib, COPD, lumbar fracture presents from outside hospital after presenting there with weakness and concern for sepsis.  He recently underwent lumbar surgery for compression fractures.  This was done on 6//2025.  No source of infection was identified patient recently had a fall recently.  He was subsequently transferred to [facility] for further evaluation.  Imaging study raise concern for perinephric hematoma.   Assessment & Plan:   Principal Problem:   Sepsis (HCC) Active Problems:   HLD (hyperlipidemia)   ATRIAL FIBRILLATION, CHRONIC   GERD   CAD (coronary artery disease)   COPD GOLD 2 with reversibility    BPH (benign prostatic hyperplasia)   Primary hypertension   Lumbar compression fracture (HCC)   Acute renal failure superimposed on stage 2 chronic kidney disease (HCC)   Anemia   Acute respiratory failure with hypoxia (HCC)   1-Perinephric hematoma/right kidney laceration: - This is secondary to recent fall.  Large hematoma identified on imaging study. - Urology was consulted.  Patient warfarin was discontinued. - Has remained stable. - If patient has hemodynamic compromise or significant drop in hemoglobin will need IR consult for consideration of embolization. -hb has remain stable.  -CT scan negative for abscess, hematoma stable.  -Continue with  flagyl , Linezolid  and Cefepime .  He has transition to comfort care. Plan to hold coumadin .   Concern for sepsis/Acute respiratory failure with hypoxia hypotension: -Appears that most of his symptoms may have been due to acute blood close from perinephric hematoma.  No clear source for infection. -Patient was empirically started on vancomycin  and cefepime .   -Worsening leukocytosis, could  be aspiration, vs perinephric abscess. Plan to check ct today. -continue with IV antibiotics. -6/25: Develops Worsening dyspnea overnight. Currently on 4 L oxyge. Continue with Respiratory toilet.  -Schedule Borvana, Pulmicort . Guifenesin.  -Hypertonic saline nebulizer.   Still requiring 4 L oxygen . Congested. Hold lasix  due to worsening cr.  Patient and family wants to focus on comfort care.    COPD Continue with Duo-neb, pulmicort  and brovana .   Recent lumbar surgery: No infection identified on examination or on imaging studies.  Neurosurgery was notified at admission  L4 burst fracture identified on MRI.  Per Dr Gillie, he saw patient 6/24, nothing to add Started  lidocaine  patch.   AKI on CKD stage II In setting perinephric hematoma.  Bladder scan.  Monitor on lasix .  Cr at 1.4 stable.   Acute Blood close anemia: In setting perinephric hematoma.  Hb stable  Chronic A-fib -Continue with Metoprolol  Holding coumadin  due to perinephric hematoma.   Dysphagia: Pass swallow eval here.  IV PPI BID>   Hypertension -continue with metoprolol .   Hyperlipidemia: Repahta out patient.  PAD Stable. Aspirin  is on hold.    Hypotension;  BP drop last night, events notice. He received IV fluids.  After events from last night, family and patient would like to focus on comfort measure.  Palliative have been consulted/  Suspect hypotension could have been related to Zanaflex , opioids. .        Estimated body mass index is 27.36 kg/m as calculated from the following:   Height as of this encounter: 5' 6 (1.676 m).   Weight as of this encounter: 76.9 kg.   DVT prophylaxis: SCD Code Status: DNR, discussed with  family.  Family Communication: family at bedside Disposition Plan:  Status is: Inpatient Remains inpatient appropriate because: management of Resp failure, perinephric abscess.     Consultants:  Urology   Procedures:    Antimicrobials:    Subjective: He  is not feeling well, he is having back pain. Events from last night notice.  Family wanted to know about prognosis, explain is difficult to determine, but if his breathing doesn't get better, and he continue to struggle with secretions, could be days.   Objective: Vitals:   12/12/23 2105 12/12/23 2110 12/12/23 2242 12/13/23 0604  BP: (!) 90/40 (!) 101/45 118/60 (!) 146/71  Pulse: 84  90 (!) 103  Resp:   16 16  Temp:   97.8 F (36.6 C) 98.4 F (36.9 C)  TempSrc:   Oral Oral  SpO2: 100% 100% 100% 98%  Weight:      Height:        Intake/Output Summary (Last 24 hours) at 12/13/2023 0721 Last data filed at 12/12/2023 2006 Gross per 24 hour  Intake 1200 ml  Output 2200 ml  Net -1000 ml   Filed Weights   12/08/23 1522  Weight: 76.9 kg    Examination:  General exam: NAD Respiratory system: BL ronchus.  Cardiovascular system: S 1, S 2 RRR Gastrointestinal system: BS present , soft, nt Central nervous system: alert Extremities:no edema   Data Reviewed: I have personally reviewed following labs and imaging studies  CBC: Recent Labs  Lab 12/09/23 0437 12/09/23 1221 12/10/23 0434 12/11/23 0432 12/12/23 0439 12/13/23 0448  WBC 15.1*  --  17.6* 20.5* 17.8* 18.4*  HGB 9.7* 9.9* 9.7* 9.5* 9.3* 10.2*  HCT 31.2* 31.5* 30.9* 29.8* 29.5* 31.7*  MCV 95.7  --  95.7 93.7 93.4 93.5  PLT 429*  --  459* 457* 409* 470*   Basic Metabolic Panel: Recent Labs  Lab 12/08/23 2027 12/08/23 2030 12/09/23 0437 12/10/23 0434 12/11/23 0432 12/12/23 0439 12/13/23 0448  NA  --    < > 135 136 134* 132* 135  K  --    < > 4.0 4.3 3.6 3.3* 3.5  CL  --    < > 105 104 99 95* 95*  CO2  --    < > 22 21* 24 27 28   GLUCOSE  --    < > 145* 153* 171* 113* 120*  BUN  --    < > 25* 25* 29* 27* 28*  CREATININE  --    < > 1.46* 1.29* 1.44* 1.45* 1.50*  CALCIUM   --    < > 8.4* 8.7* 8.8* 8.4* 8.7*  MG 2.0  --   --   --   --   --   --    < > = values in this interval not displayed.   GFR: Estimated  Creatinine Clearance: 35.1 mL/min (A) (by C-G formula based on SCr of 1.5 mg/dL (H)). Liver Function Tests: Recent Labs  Lab 12/09/23 0437  AST 67*  ALT 35  ALKPHOS 60  BILITOT 1.1  PROT 6.0*  ALBUMIN 2.2*   No results for input(s): LIPASE, AMYLASE in the last 168 hours. No results for input(s): AMMONIA in the last 168 hours. Coagulation Profile: Recent Labs  Lab 12/08/23 2030  INR 2.4*   Cardiac Enzymes: No results for input(s): CKTOTAL, CKMB, CKMBINDEX, TROPONINI in the last 168 hours. BNP (last 3 results) No results for input(s): PROBNP in the last 8760 hours. HbA1C: No results for input(s): HGBA1C in the  last 72 hours. CBG: No results for input(s): GLUCAP in the last 168 hours. Lipid Profile: No results for input(s): CHOL, HDL, LDLCALC, TRIG, CHOLHDL, LDLDIRECT in the last 72 hours. Thyroid  Function Tests: No results for input(s): TSH, T4TOTAL, FREET4, T3FREE, THYROIDAB in the last 72 hours. Anemia Panel: No results for input(s): VITAMINB12, FOLATE, FERRITIN, TIBC, IRON, RETICCTPCT in the last 72 hours. Sepsis Labs: No results for input(s): PROCALCITON, LATICACIDVEN in the last 168 hours.  Recent Results (from the past 240 hours)  Culture, blood (Routine X 2) w Reflex to ID Panel     Status: None (Preliminary result)   Collection Time: 12/11/23  7:49 AM   Specimen: BLOOD LEFT ARM  Result Value Ref Range Status   Specimen Description BLOOD LEFT ARM  Final   Special Requests   Final    BOTTLES DRAWN AEROBIC AND ANAEROBIC Blood Culture adequate volume   Culture   Final    NO GROWTH < 24 HOURS Performed at [facility], [address], [place], [place] [address]    Report Status PENDING  Incomplete  Culture, blood (Routine X 2) w Reflex to ID Panel     Status: None (Preliminary result)   Collection Time: 12/11/23  7:56 AM   Specimen: BLOOD RIGHT ARM  Result Value Ref Range Status   Specimen  Description BLOOD RIGHT ARM  Final   Special Requests   Final    BOTTLES DRAWN AEROBIC AND ANAEROBIC Blood Culture adequate volume   Culture   Final    NO GROWTH 1 DAY Performed at [facility], [address] [place], [place] [address]    Report Status PENDING  Incomplete         Radiology Studies: CT ABDOMEN PELVIS W CONTRAST Result Date: 12/12/2023 CLINICAL DATA:  Abdominal abscess. EXAM: CT ABDOMEN AND PELVIS WITH CONTRAST TECHNIQUE: Multidetector CT imaging of the abdomen and pelvis was performed using the standard protocol following bolus administration of intravenous contrast. RADIATION DOSE REDUCTION: This exam was performed according to the departmental dose-optimization program which includes automated exposure control, adjustment of the mA and/or kV according to patient size and/or use of iterative reconstruction technique. CONTRAST:  60mL OMNIPAQUE  IOHEXOL  350 MG/ML SOLN COMPARISON:  CT abdomen and pelvis 12/08/2023. FINDINGS: Lower chest: There are small bilateral pleural effusions, right greater than left. Right pleural effusion has increased. There is atelectasis in the lung bases. Hepatobiliary: Mild hepatic cysts measuring 11 mm is unchanged. Gallbladder and bile ducts are within normal limits. Pancreas: Unremarkable. No pancreatic ductal dilatation or surrounding inflammatory changes. Spleen: Normal in size without focal abnormality. Adrenals/Urinary Tract: The left kidney, bilateral adrenal glands and bladder are within normal limits. Again seen is a large mid and inferior pole right perinephric hematoma measuring up to 5 cm in thickness, unchanged from the prior examination. Low-attenuation non enhancement in the mid and lower pole the right kidney are concerning for laceration which also appears unchanged. Stranding and hemorrhage extend inferiorly from the level of the right kidney down the right retroperitoneum to the level the pelvic inlet, also unchanged.  Stomach/Bowel: Stomach is within normal limits. No evidence of bowel wall thickening, distention, or inflammatory changes. The appendix is not seen. There is sigmoid colon diverticulosis. Vascular/Lymphatic: Aorta and IVC are normal in size. There are atherosclerotic calcifications of the aorta. No enlarged lymph nodes are identified. Reproductive: Patient is status post TURP. Prostate gland is enlarged. Other: Presacral edema is unchanged. Body wall edema is unchanged. There  is no ascites. There is no focal abdominal wall hernia. No enhancing fluid collection or free air identified. Musculoskeletal: L3-L5 posterior fusion hardware appears unchanged. Compression deformity of L4 is unchanged. IMPRESSION: 1. Stable large right perinephric hematoma with right retroperitoneal hemorrhage. 2. Stable right renal laceration. 3. Stable presacral and body wall edema. 4. Bilateral pleural effusions, increasing on the right. Aortic Atherosclerosis (ICD10-I70.0). Electronically Signed   By: Greig Pique M.D.   On: 12/12/2023 19:35         Scheduled Meds:  arformoterol   15 mcg Nebulization BID   budesonide  (PULMICORT ) nebulizer solution  0.25 mg Nebulization BID   guaiFENesin   15 mL Oral TID   lidocaine   1 patch Transdermal Q24H   midodrine   10 mg Oral STAT   pantoprazole  (PROTONIX ) IV  40 mg Intravenous Q12H   phenol  1 spray Mouth/Throat TID   potassium chloride   40 mEq Oral BID   QUEtiapine   25 mg Oral QHS   sodium chloride  flush  3 mL Intravenous Q12H   sodium chloride  HYPERTONIC  4 mL Nebulization Daily   Continuous Infusions:  ceFEPime  (MAXIPIME ) IV 2 g (12/12/23 2255)   linezolid  (ZYVOX ) IV 600 mg (12/12/23 2254)   metronidazole  500 mg (12/12/23 2113)     LOS: 5 days    Time spent: 35 minutes    Kinzlee Selvy A Chaz Mcglasson, MD [facility]   If 7PM-7AM, please contact night-coverage [email or web address]  12/13/2023, 7:21 AM

## 2023-12-13 NOTE — TOC PASRR Note (Signed)
 30 Day PASRR Note   Patient Details  Name: [name] Date of Birth: [birth year]   Transition of Care [facility]) CM/SW Contact:    Almarie CHRISTELLA Goodie, LCSW Phone Number: 12/13/2023, 10:17 AM  To Whom It May Concern:  Please be advised that this patient will require a short-term nursing home stay - anticipated 30 days or less for rehabilitation and strengthening.   The plan is for return home.

## 2023-12-13 NOTE — Plan of Care (Signed)
 Family decided to change him to Comfort Care.  Resting comfortably. Receiving prn pain meds and sitting up in the bed talking with family.  Tearful at times, when he sees his wife crying.  Pt and family declined chaplain services.    Problem: Coping: Goal: Level of anxiety will decrease Outcome: Progressing   Problem: Activity: Goal: Risk for activity intolerance will decrease Outcome: Progressing   Problem: Safety: Goal: Ability to remain free from injury will improve Outcome: Progressing   Problem: Skin Integrity: Goal: Risk for impaired skin integrity will decrease Outcome: Progressing

## 2023-12-14 DIAGNOSIS — J9601 Acute respiratory failure with hypoxia: Secondary | ICD-10-CM | POA: Diagnosis not present

## 2023-12-14 DIAGNOSIS — A419 Sepsis, unspecified organism: Secondary | ICD-10-CM | POA: Diagnosis not present

## 2023-12-14 DIAGNOSIS — Z7189 Other specified counseling: Secondary | ICD-10-CM | POA: Diagnosis not present

## 2023-12-14 DIAGNOSIS — Z515 Encounter for palliative care: Secondary | ICD-10-CM | POA: Diagnosis not present

## 2023-12-14 DIAGNOSIS — R6520 Severe sepsis without septic shock: Secondary | ICD-10-CM | POA: Diagnosis not present

## 2023-12-14 MED ORDER — QUETIAPINE 12.5 MG HALF TABLET
25.0000 mg | ORAL_TABLET | Freq: Every day | ORAL | Status: DC
  Filled 2023-12-14: qty 2

## 2023-12-14 MED ORDER — HYDROMORPHONE BOLUS VIA INFUSION: 1.0000 mg | INTRAVENOUS | Status: DC | PRN

## 2023-12-14 MED ORDER — HYDROMORPHONE HCL-NACL 50-0.9 MG/50ML-% IV SOLN
0.0000 mg/h | INTRAVENOUS | Status: DC
  Administered 2023-12-14: 1 mg/h via INTRAVENOUS
  Filled 2023-12-14: qty 50

## 2023-12-14 NOTE — Progress Notes (Signed)
 PROGRESS NOTE    [name]  FMW:[id number] DOB: 1941/03/22 DOA: 12/08/2023 PCP: Severa Rock CHRISTELLA, FNP   Brief Narrative: 83 year old with past medical history significant for hypertension, hyperlipidemia, GERD, CKD 2, sleep apnea, BPH, A-fib, COPD, lumbar fracture presents from outside hospital after presenting there with weakness and concern for sepsis.  He recently underwent lumbar surgery for compression fractures.  This was done on 6//2025.  No source of infection was identified patient recently had a fall recently.  He was subsequently transferred to [facility] for further evaluation.  Imaging study raise concern for perinephric hematoma.   Assessment & Plan:   Principal Problem:   Sepsis (HCC) Active Problems:   HLD (hyperlipidemia)   ATRIAL FIBRILLATION, CHRONIC   GERD   CAD (coronary artery disease)   COPD GOLD 2 with reversibility    BPH (benign prostatic hyperplasia)   Primary hypertension   Lumbar compression fracture (HCC)   Acute renal failure superimposed on stage 2 chronic kidney disease (HCC)   Anemia   Acute respiratory failure with hypoxia (HCC)   1-Perinephric hematoma/right kidney laceration: - This is secondary to recent fall.  Large hematoma identified on imaging study. - Urology was consulted.  Patient warfarin was discontinued. - Has remained stable. - If patient has hemodynamic compromise or significant drop in hemoglobin will need IR consult for consideration of embolization. -hb has remain stable.  -CT scan negative for abscess, hematoma stable.  -Continue with  flagyl , Linezolid  and Cefepime .  He has transition to comfort care.   Concern for sepsis/Acute respiratory failure with hypoxia hypotension: -Appears that most of his symptoms may have been due to acute blood close from perinephric hematoma.  No clear source for infection. -Patient was empirically started on vancomycin  and cefepime .   -Worsening leukocytosis, could be aspiration, vs  perinephric abscess. Plan to check ct today. -continue with IV antibiotics. -6/25: Develops Worsening dyspnea overnight. Currently on 4 L oxyge. Continue with Respiratory toilet.  -Schedule Borvana, Pulmicort . Guifenesin.  -Hypertonic saline nebulizer.   On 4 L oxygen . Congested. Hold lasix  due to worsening cr.  Patient and family wants to focus on comfort care.    COPD Continue with Duo-neb, pulmicort  and brovana .   Recent lumbar surgery: No infection identified on examination or on imaging studies.  Neurosurgery was notified at admission  L4 burst fracture identified on MRI.  Per Dr Gillie, he saw patient 6/24, nothing to add Started  lidocaine  patch.   AKI on CKD stage II In setting perinephric hematoma.  Bladder scan.  Monitor on lasix .  Cr at 1.4 stable.   Acute Blood close anemia: In setting perinephric hematoma.  Hb stable  Chronic A-fib -Continue with Metoprolol  Holding coumadin  due to perinephric hematoma.   Dysphagia: Pass swallow eval here.  IV PPI BID>   Hypertension -continue with metoprolol .   Hyperlipidemia: Repahta out patient.  PAD Stable. Aspirin  is on hold.    Hypotension;  BP drop last night, events notice. He received IV fluids.  After events from last night, family and patient would like to focus on comfort measure.  Palliative have been consulted/  Suspect hypotension could have been related to Zanaflex , opioids. .        Estimated body mass index is 27.36 kg/m as calculated from the following:   Height as of this encounter: 5' 6 (1.676 m).   Weight as of this encounter: 76.9 kg.   DVT prophylaxis: SCD Code Status: DNR, discussed with family.  Family Communication: family  at bedside Disposition Plan:  Status is: Inpatient Remains inpatient appropriate because: management of Resp failure, perinephric abscess.     Consultants:  Urology   Procedures:    Antimicrobials:    Subjective: Alert, sounds very congested.   Family at bedside, patient gets frustrated after he gets pain med or sedative if he is not able to speak. Family will further discuss with palliative about medication titration. They are waiting for patient's spouse to get to hospital.   Objective: Vitals:   12/13/23 0837 12/13/23 1125 12/13/23 1943 12/14/23 1203  BP:  (!) 129/57 (!) 144/85 (!) 154/62  Pulse:  92 97 (!) 102  Resp:  20 16 17   Temp:  (!) 97.5 F (36.4 C) 98 F (36.7 C) 98.2 F (36.8 C)  TempSrc:  Oral Oral Axillary  SpO2: 98% 98% 97% 97%  Weight:      Height:        Intake/Output Summary (Last 24 hours) at 12/14/2023 1357 Last data filed at 12/14/2023 1024 Gross per 24 hour  Intake --  Output 1125 ml  Net -1125 ml   Filed Weights   12/08/23 1522  Weight: 76.9 kg    Examination:  General exam: NAD Respiratory system: BL ronchus.  Gastrointestinal system: BS present, soft,  Central nervous system: Alert.  Extremities: No edema   Data Reviewed: I have personally reviewed following labs and imaging studies  CBC: Recent Labs  Lab 12/09/23 0437 12/09/23 1221 12/10/23 0434 12/11/23 0432 12/12/23 0439 12/13/23 0448  WBC 15.1*  --  17.6* 20.5* 17.8* 18.4*  HGB 9.7* 9.9* 9.7* 9.5* 9.3* 10.2*  HCT 31.2* 31.5* 30.9* 29.8* 29.5* 31.7*  MCV 95.7  --  95.7 93.7 93.4 93.5  PLT 429*  --  459* 457* 409* 470*   Basic Metabolic Panel: Recent Labs  Lab 12/08/23 2027 12/08/23 2030 12/09/23 0437 12/10/23 0434 12/11/23 0432 12/12/23 0439 12/13/23 0448  NA  --    < > 135 136 134* 132* 135  K  --    < > 4.0 4.3 3.6 3.3* 3.5  CL  --    < > 105 104 99 95* 95*  CO2  --    < > 22 21* 24 27 28   GLUCOSE  --    < > 145* 153* 171* 113* 120*  BUN  --    < > 25* 25* 29* 27* 28*  CREATININE  --    < > 1.46* 1.29* 1.44* 1.45* 1.50*  CALCIUM   --    < > 8.4* 8.7* 8.8* 8.4* 8.7*  MG 2.0  --   --   --   --   --   --    < > = values in this interval not displayed.   GFR: Estimated Creatinine Clearance: 35.1 mL/min  (A) (by C-G formula based on SCr of 1.5 mg/dL (H)). Liver Function Tests: Recent Labs  Lab 12/09/23 0437  AST 67*  ALT 35  ALKPHOS 60  BILITOT 1.1  PROT 6.0*  ALBUMIN 2.2*   No results for input(s): LIPASE, AMYLASE in the last 168 hours. No results for input(s): AMMONIA in the last 168 hours. Coagulation Profile: Recent Labs  Lab 12/08/23 2030  INR 2.4*   Cardiac Enzymes: No results for input(s): CKTOTAL, CKMB, CKMBINDEX, TROPONINI in the last 168 hours. BNP (last 3 results) No results for input(s): PROBNP in the last 8760 hours. HbA1C: No results for input(s): HGBA1C in the last 72 hours. CBG: No results for input(s):  GLUCAP in the last 168 hours. Lipid Profile: No results for input(s): CHOL, HDL, LDLCALC, TRIG, CHOLHDL, LDLDIRECT in the last 72 hours. Thyroid  Function Tests: No results for input(s): TSH, T4TOTAL, FREET4, T3FREE, THYROIDAB in the last 72 hours. Anemia Panel: No results for input(s): VITAMINB12, FOLATE, FERRITIN, TIBC, IRON, RETICCTPCT in the last 72 hours. Sepsis Labs: No results for input(s): PROCALCITON, LATICACIDVEN in the last 168 hours.  Recent Results (from the past 240 hours)  Culture, blood (Routine X 2) w Reflex to ID Panel     Status: None (Preliminary result)   Collection Time: 12/11/23  7:49 AM   Specimen: BLOOD LEFT ARM  Result Value Ref Range Status   Specimen Description BLOOD LEFT ARM  Final   Special Requests   Final    BOTTLES DRAWN AEROBIC AND ANAEROBIC Blood Culture adequate volume   Culture   Final    NO GROWTH 3 DAYS Performed at [facility], [address] [place], [place] [address]    Report Status PENDING  Incomplete  Culture, blood (Routine X 2) w Reflex to ID Panel     Status: None (Preliminary result)   Collection Time: 12/11/23  7:56 AM   Specimen: BLOOD RIGHT ARM  Result Value Ref Range Status   Specimen Description BLOOD RIGHT ARM  Final   Special  Requests   Final    BOTTLES DRAWN AEROBIC AND ANAEROBIC Blood Culture adequate volume   Culture   Final    NO GROWTH 3 DAYS Performed at [facility], [address] [place], [place] [address]    Report Status PENDING  Incomplete         Radiology Studies: CT ABDOMEN PELVIS W CONTRAST Result Date: 12/12/2023 CLINICAL DATA:  Abdominal abscess. EXAM: CT ABDOMEN AND PELVIS WITH CONTRAST TECHNIQUE: Multidetector CT imaging of the abdomen and pelvis was performed using the standard protocol following bolus administration of intravenous contrast. RADIATION DOSE REDUCTION: This exam was performed according to the departmental dose-optimization program which includes automated exposure control, adjustment of the mA and/or kV according to patient size and/or use of iterative reconstruction technique. CONTRAST:  60mL OMNIPAQUE  IOHEXOL  350 MG/ML SOLN COMPARISON:  CT abdomen and pelvis 12/08/2023. FINDINGS: Lower chest: There are small bilateral pleural effusions, right greater than left. Right pleural effusion has increased. There is atelectasis in the lung bases. Hepatobiliary: Mild hepatic cysts measuring 11 mm is unchanged. Gallbladder and bile ducts are within normal limits. Pancreas: Unremarkable. No pancreatic ductal dilatation or surrounding inflammatory changes. Spleen: Normal in size without focal abnormality. Adrenals/Urinary Tract: The left kidney, bilateral adrenal glands and bladder are within normal limits. Again seen is a large mid and inferior pole right perinephric hematoma measuring up to 5 cm in thickness, unchanged from the prior examination. Low-attenuation non enhancement in the mid and lower pole the right kidney are concerning for laceration which also appears unchanged. Stranding and hemorrhage extend inferiorly from the level of the right kidney down the right retroperitoneum to the level the pelvic inlet, also unchanged. Stomach/Bowel: Stomach is within normal limits. No  evidence of bowel wall thickening, distention, or inflammatory changes. The appendix is not seen. There is sigmoid colon diverticulosis. Vascular/Lymphatic: Aorta and IVC are normal in size. There are atherosclerotic calcifications of the aorta. No enlarged lymph nodes are identified. Reproductive: Patient is status post TURP. Prostate gland is enlarged. Other: Presacral edema is unchanged. Body wall edema is unchanged. There is no ascites. There is no focal abdominal wall  hernia. No enhancing fluid collection or free air identified. Musculoskeletal: L3-L5 posterior fusion hardware appears unchanged. Compression deformity of L4 is unchanged. IMPRESSION: 1. Stable large right perinephric hematoma with right retroperitoneal hemorrhage. 2. Stable right renal laceration. 3. Stable presacral and body wall edema. 4. Bilateral pleural effusions, increasing on the right. Aortic Atherosclerosis (ICD10-I70.0). Electronically Signed   By: Greig Pique M.D.   On: 12/12/2023 19:35         Scheduled Meds:  lidocaine   1 patch Transdermal Q24H   QUEtiapine   25 mg Oral QHS   scopolamine  1 patch Transdermal Q72H   sodium chloride  flush  3 mL Intravenous Q12H   Continuous Infusions:     LOS: 6 days    Time spent: 35 minutes    Laszlo Ellerby A Nyajah Hyson, MD [facility]   If 7PM-7AM, please contact night-coverage [email or web address]  12/14/2023, 1:57 PM

## 2023-12-14 NOTE — Progress Notes (Signed)
 Daily Progress Note   Patient Name: [name]       Date: 12/14/2023 DOB: 04/10/1941  Age: 83 y.o. MRN#: [id number] Attending Physician: Madelyne Owen LABOR, MD Primary Care Physician: Severa Rock CHRISTELLA, FNP Admit Date: 12/08/2023  Reason for Consultation/Follow-up: Establishing goals of care  Length of Stay: 6  Current Medications: Scheduled Meds:   lidocaine   1 patch Transdermal Q24H   scopolamine  1 patch Transdermal Q72H   sodium chloride  flush  3 mL Intravenous Q12H    Continuous Infusions:   PRN Meds: acetaminophen  **OR** acetaminophen , albuterol , alum & mag hydroxide-simeth, artificial tears, diphenhydrAMINE, glycopyrrolate  **OR** glycopyrrolate  **OR** glycopyrrolate , haloperidol **OR** haloperidol **OR** haloperidol lactate, HYDROmorphone  (DILAUDID ) injection, LORazepam  **OR** [DISCONTINUED] LORazepam  **OR** LORazepam , ondansetron  **OR** ondansetron  (ZOFRAN ) IV, oxyCODONE , phenol, polyethylene glycol  Physical Exam Constitutional:      General: He is sleeping. He is not in acute distress.    Appearance: He is ill-appearing.     Interventions: Nasal cannula in place.  HENT:     Head: Normocephalic and atraumatic.  Pulmonary:     Effort: Pulmonary effort is normal.             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   Primary hypertension 04/21/2023   TIA (transient ischemic attack) 04/21/2023   Leg swelling 08/31/2022   Upper airway cough syndrome 04/13/2022   Chronic fatigue 09/19/2021   Solitary pulmonary nodule on lung CT 09/05/2021   Shuffling gait 08/05/2020   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   Meibomian gland dysfunction (MGD) of both eyes 05/08/2020  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 male  with past medical history of hypertension, hyperlipidemia, GERD, CKD 2, sleep apnea, BPH, A-fib, COPD, lumbar fracture (ORIF L3-L5 on 11/20/23) admitted on 12/08/2023 from outside hospital with weakness and concern for sepsis. He had also had a recent fall. CT chest showed R perinephric hematoma. CXR suggestive pulmonary edema.   Today's Discussion: Reviewed chart. Patient sleeping. He appears comfortable and in NAD. Patient's oxygen  has been weaned down from four liters. Breathing is even and unlabored. Wife, daughter, and son at bedside. Patient required prn dilaudid  twice in the last 24 hours. Family shared patient became frustrated yesterday after his first dose of dilaudid  because he began having trouble communicating with his family. The second dose he  received was a smaller dose and this worked better for him. They shared he remained awake most of the night. Encouraged family to request prn medications for sleep if this happens again tonight.   Encouraged family to call PMT with questions or concerns. PMT will continue to support.  Recommendations/Plan: Full comfort care Family prefers hospital death Continued PMT support    Code Status:    Code Status Orders  (From admission, onward)           Start     Ordered   12/13/23 1057  Do not attempt resuscitation (DNR) - Comfort care  Continuous       Question Answer Comment  If patient has no pulse and is not breathing Do Not Attempt Resuscitation   In Pre-Arrest Conditions (Patient Is Breathing and Has a Pulse) Provide comfort measures. Relieve any mechanical airway obstruction. Avoid transfer unless required for comfort.   Consent: Discussion documented in EHR or advanced directives reviewed      12/13/23 1104        Extensive chart review has been completed prior to seeing the patient including labs, vital signs, imaging, progress/consult notes, orders, medications, and available advance directive documents.  Care plan was discussed with bedside RN  Time spent: 25 minutes  Thank you for allowing the Palliative Medicine Team to assist in the care of this patient.   Stephane CHRISTELLA Palin, NP  Please contact Palliative Medicine Team phone at [phone] for questions and concerns.

## 2023-12-14 NOTE — Plan of Care (Signed)
  Problem: Clinical Measurements: Goal: Ability to maintain clinical measurements within normal limits will improve Outcome: Progressing   Problem: Clinical Measurements: Goal: Will remain free from infection Outcome: Progressing   Problem: Clinical Measurements: Goal: Respiratory complications will improve Outcome: Progressing   Problem: Clinical Measurements: Goal: Cardiovascular complication will be avoided Outcome: Progressing   Problem: Activity: Goal: Risk for activity intolerance will decrease Outcome: Progressing   Problem: Nutrition: Goal: Adequate nutrition will be maintained Outcome: Progressing   Problem: Pain Managment: Goal: General experience of comfort will improve and/or be controlled Outcome: Progressing   Problem: Safety: Goal: Ability to remain free from injury will improve Outcome: Progressing   Problem: Skin Integrity: Goal: Risk for impaired skin integrity will decrease Outcome: Progressing

## 2023-12-14 NOTE — Progress Notes (Signed)
     Patient Name: [name]           DOB: [birth year]  MRN: [id number]      Admission Date: 12/08/2023  Attending Provider: Madelyne Owen LABOR, MD  Primary Diagnosis: Sepsis (HCC)   Level of care: Telemetry Medical   OVERNIGHT PROGRESS REPORT  RN reports pt has been needing a higher dose of dilaudid  more frequently due to signs of discomfort.  Patient transitioned to Dilaudid  gtt. Family is agreeable to this per RN communication.    Tatayana Beshears, DNP, ACNPC- AG [facility] [place]

## 2023-12-14 NOTE — Progress Notes (Signed)
 Pt is awake, alert when received and up to this time, not in any respiratory distress.

## 2023-12-14 NOTE — Plan of Care (Signed)
  Problem: Pain Managment: Goal: General experience of comfort will improve and/or be controlled Outcome: Progressing  Patient is comfort care with a focus on pain management.

## 2023-12-15 DIAGNOSIS — J9601 Acute respiratory failure with hypoxia: Secondary | ICD-10-CM | POA: Diagnosis not present

## 2023-12-15 DIAGNOSIS — A419 Sepsis, unspecified organism: Secondary | ICD-10-CM | POA: Diagnosis not present

## 2023-12-15 DIAGNOSIS — R6520 Severe sepsis without septic shock: Secondary | ICD-10-CM | POA: Diagnosis not present

## 2023-12-16 LAB — CULTURE, BLOOD (ROUTINE X 2)
Culture: NO GROWTH
Special Requests: ADEQUATE
Special Requests: ADEQUATE

## 2023-12-17 NOTE — Progress Notes (Addendum)
 Received Pt in the bed with family present. Pt receiving pain medication every 30 minutes. Talked to [facility] NP night hospitalist inquired about dilaudid  drip. Discussed with family who stated it was discussed with palliative care. Family agreed and  requesting higher dose to decrease discomfort. Orders placed by provider.

## 2023-12-17 NOTE — Progress Notes (Signed)
 Pt passed away at 529 peacefully with family present. Pt was on dilaudid  Intravenous 7.5 wasted.

## 2023-12-17 NOTE — Death Summary Note (Signed)
 DEATH SUMMARY   Patient Details  Name: [name] MRN: [id number] DOB: 13-Jul-1940 ERE:Mjxzd, Rock CHRISTELLA, FNP Admission/Discharge Information   Admit Date:  12-31-23  Date of Death: Date of Death: Jan 07, 2024  Time of Death: Time of Death: 0529  Length of Stay: 7   Principle Cause of death: Respiratory failure, Aspiration PNA.   Hospital Diagnoses: Principal Problem:   Sepsis (HCC) Active Problems:   HLD (hyperlipidemia)   ATRIAL FIBRILLATION, CHRONIC   GERD   CAD (coronary artery disease)   COPD GOLD 2 with reversibility    BPH (benign prostatic hyperplasia)   Primary hypertension   Lumbar compression fracture (HCC)   Acute renal failure superimposed on stage 2 chronic kidney disease (HCC)   Anemia   Acute respiratory failure with hypoxia [facility])   Hospital Course: 83 year old with past medical history significant for hypertension, hyperlipidemia, GERD, CKD 2, sleep apnea, BPH, A-fib, COPD, lumbar fracture presents from outside hospital after presenting there with weakness and concern for sepsis.  He recently underwent lumbar surgery for compression fractures.  This was done on 6//2025.  No source of infection was identified patient recently had a fall recently.  He was subsequently transferred to [facility] for further evaluation.  Imaging study raise concern for perinephric hematoma.  Patient develops respiratory failure, placed on Oxygen , he had difficulty managing secretions. He was treated aggressively, fail to progress. Patient and family wishes to proceed with full comfort care. He was started on Dilaudid  gtt.      Assessment and Plan: 1-Perinephric hematoma/right kidney laceration: - This is secondary to recent fall.  Large hematoma identified on imaging study. - Urology was consulted.  Patient warfarin was discontinued. - Has remained stable. - If patient has hemodynamic compromise or significant drop in hemoglobin will need IR consult for consideration of  embolization. -hb has remain stable.  -CT scan negative for abscess, hematoma stable.  -Continue with  flagyl , Linezolid  and Cefepime .  He has transition to comfort care.    Concern for sepsis/ Aspiration PNA Acute respiratory failure with hypoxia hypotension: -Appears that most of his symptoms may have been due to acute blood close from perinephric hematoma. -Patient was empirically started on vancomycin  and cefepime .   -Worsening leukocytosis, could be aspiration -continue with IV antibiotics. -6/25: Develops Worsening dyspnea overnight. Currently on 4 L oxyge. Continue with Respiratory toilet.  -Schedule Borvana, Pulmicort . Guifenesin.  -Hypertonic saline nebulizer.   On 4 L oxygen . Congested. Hold lasix  due to worsening cr.  Patient and family wants to focus on comfort care.   -Transition to full comfort care.   COPD Continue with Duo-neb, pulmicort  and brovana .    Recent lumbar surgery: No infection identified on examination or on imaging studies.  Neurosurgery was notified at admission  L4 burst fracture identified on MRI.  Per Dr Gillie, he saw patient 6/24, nothing to add Started  lidocaine  patch.    AKI on CKD stage II In setting perinephric hematoma.  Bladder scan.  Monitor on lasix .  Cr at 1.4 stable.    Acute Blood close anemia: In setting perinephric hematoma.  Hb stable   Chronic A-fib -Continue with Metoprolol  Holding coumadin  due to perinephric hematoma.    Dysphagia: Pass swallow eval here.  IV PPI BID>    Hypertension -continue with metoprolol .    Hyperlipidemia: Repahta out patient.  PAD Stable. Aspirin  is on hold.      Hypotension;  BP drop last night, events notice. He received IV fluids.  After  events from last night, family and patient would like to focus on comfort measure.  Palliative have been consulted/  Suspect hypotension could have been related to Zanaflex , opioids. .                   Consultations: Urology,  palliative  The results of significant diagnostics from this hospitalization (including imaging, microbiology, ancillary and laboratory) are listed below for reference.   Significant Diagnostic Studies: CT ABDOMEN PELVIS W CONTRAST Result Date: 12/12/2023 CLINICAL DATA:  Abdominal abscess. EXAM: CT ABDOMEN AND PELVIS WITH CONTRAST TECHNIQUE: Multidetector CT imaging of the abdomen and pelvis was performed using the standard protocol following bolus administration of intravenous contrast. RADIATION DOSE REDUCTION: This exam was performed according to the departmental dose-optimization program which includes automated exposure control, adjustment of the mA and/or kV according to patient size and/or use of iterative reconstruction technique. CONTRAST:  60mL OMNIPAQUE  IOHEXOL  350 MG/ML SOLN COMPARISON:  CT abdomen and pelvis 12/08/2023. FINDINGS: Lower chest: There are small bilateral pleural effusions, right greater than left. Right pleural effusion has increased. There is atelectasis in the lung bases. Hepatobiliary: Mild hepatic cysts measuring 11 mm is unchanged. Gallbladder and bile ducts are within normal limits. Pancreas: Unremarkable. No pancreatic ductal dilatation or surrounding inflammatory changes. Spleen: Normal in size without focal abnormality. Adrenals/Urinary Tract: The left kidney, bilateral adrenal glands and bladder are within normal limits. Again seen is a large mid and inferior pole right perinephric hematoma measuring up to 5 cm in thickness, unchanged from the prior examination. Low-attenuation non enhancement in the mid and lower pole the right kidney are concerning for laceration which also appears unchanged. Stranding and hemorrhage extend inferiorly from the level of the right kidney down the right retroperitoneum to the level the pelvic inlet, also unchanged. Stomach/Bowel: Stomach is within normal limits. No evidence of bowel wall thickening, distention, or inflammatory changes. The  appendix is not seen. There is sigmoid colon diverticulosis. Vascular/Lymphatic: Aorta and IVC are normal in size. There are atherosclerotic calcifications of the aorta. No enlarged lymph nodes are identified. Reproductive: Patient is status post TURP. Prostate gland is enlarged. Other: Presacral edema is unchanged. Body wall edema is unchanged. There is no ascites. There is no focal abdominal wall hernia. No enhancing fluid collection or free air identified. Musculoskeletal: L3-L5 posterior fusion hardware appears unchanged. Compression deformity of L4 is unchanged. IMPRESSION: 1. Stable large right perinephric hematoma with right retroperitoneal hemorrhage. 2. Stable right renal laceration. 3. Stable presacral and body wall edema. 4. Bilateral pleural effusions, increasing on the right. Aortic Atherosclerosis (ICD10-I70.0). Electronically Signed   By: Greig Pique M.D.   On: 12/12/2023 19:35   DG Abd Portable 1V Result Date: 12/10/2023 CLINICAL DATA:  [id number] Abdominal pain [id number] EXAM: PORTABLE ABDOMEN - 1 VIEW COMPARISON:  December 08, 2023 FINDINGS: Nonobstructive bowel gas pattern. No pneumoperitoneum. No organomegaly or radiopaque calculi. No acute fracture or destructive lesion.Lower lumbar fusion hardware. Bilateral hip osteoarthritis. IMPRESSION: Nonobstructive bowel gas pattern. Electronically Signed   By: Rogelia Myers M.D.   On: 12/10/2023 15:57   DG CHEST PORT 1 VIEW Result Date: 12/10/2023 CLINICAL DATA:  Dyspnea and abdominal pain. EXAM: PORTABLE CHEST 1 VIEW COMPARISON:  12/17/2022.  CT 12/08/2023. FINDINGS: The Chin overlies the apices. Midline trachea. Mild cardiomegaly. Atherosclerosis in the transverse aorta. Small layering right pleural effusion. No pneumothorax. Pulmonary interstitial prominence and indistinctness is mild, accentuated by AP portable technique. Bibasilar airspace disease. IMPRESSION: Cardiomegaly with pulmonary interstitial thickening, favoring mild interstitial  edema.  Small layering right pleural effusion with bibasilar Airspace disease, likely atelectasis. Aortic Atherosclerosis (ICD10-I70.0). Electronically Signed   By: Rockey Kilts M.D.   On: 12/10/2023 13:04   CT ABDOMEN PELVIS W CONTRAST Result Date: 12/08/2023 CLINICAL DATA:  Abdominal pain.  Perinephric hematoma on chest CT. EXAM: CT ABDOMEN AND PELVIS WITH CONTRAST TECHNIQUE: Multidetector CT imaging of the abdomen and pelvis was performed using the standard protocol following bolus administration of intravenous contrast. RADIATION DOSE REDUCTION: This exam was performed according to the departmental dose-optimization program which includes automated exposure control, adjustment of the mA and/or kV according to patient size and/or use of iterative reconstruction technique. CONTRAST:  75mL OMNIPAQUE  IOHEXOL  350 MG/ML SOLN COMPARISON:  Chest CT earlier today.  Abdominal CT 09/24/2023. FINDINGS: Lower chest: Small bilateral pleural effusions stable since earlier chest CT. Dependent atelectasis in the right lower lobe. Coronary artery and aortic atherosclerosis. Hepatobiliary: No focal hepatic abnormality. Gallbladder unremarkable. Pancreas: No focal abnormality or ductal dilatation. Spleen: No focal abnormality.  Normal size. Adrenals/Urinary Tract: Adrenal glands and left kidney unremarkable. Heterogeneous fluid collection noted surrounding the right kidney compatible with perinephric hematoma. Area of non enhancement in the mid to lower pole of the right kidney concerning for renal laceration. No active extravasation of contrast seen. No visible urinary extravasation. Urinary bladder unremarkable. Stomach/Bowel: Sigmoid diverticulosis. No active diverticulitis. Stomach and small bowel decompressed. No bowel obstruction. Vascular/Lymphatic: Aortoiliac atherosclerosis. No evidence of aneurysm or adenopathy. Reproductive: No visible focal abnormality. Other: Trace free fluid in the pelvis.  No free air. Musculoskeletal:  Postoperative and degenerative changes in the lumbar spine. Compression fracture noted at L4 as seen on earlier lumbar spine MRI. IMPRESSION: Area of low-density and non enhancement in the mid to lower pole of the right kidney concerning for renal laceration. Associated large right perinephric hematoma which extends beyond the perirenal fascia into the retroperitoneum. Small bilateral pleural effusions. Coronary artery disease, aortic atherosclerosis. L4 compression fracture as described on earlier lumbar spine MRI. Sigmoid diverticulosis. Electronically Signed   By: Franky Crease M.D.   On: 12/08/2023 23:24   MR LUMBAR SPINE W WO CONTRAST Result Date: 12/08/2023 CLINICAL DATA:  Low back pain with infection suspected EXAM: MRI LUMBAR SPINE WITHOUT AND WITH CONTRAST TECHNIQUE: Multiplanar and multiecho pulse sequences of the lumbar spine were obtained without and with intravenous contrast. CONTRAST:  7mL GADAVIST  GADOBUTROL  1 MMOL/ML IV SOLN COMPARISON:  05/19/2021 lumbar spine MRI and 09/24/2023 CT abdomen pelvis FINDINGS: Segmentation:  Standard. Alignment:  Grade 1 retrolisthesis at L2-3 Vertebrae: Incomplete burst fracture of L4 with 50% height loss and 2 mm retropulsion. Mild edema throughout the bone marrow. There is posterior fusion at L3-L5 with bilateral L3 and L5 transpedicular screws at the left L4 transpedicular screw. There is no evidence of discitis-osteomyelitis. Conus medullaris and cauda equina: Conus extends to the L1 level. Conus and cauda equina appear normal. Paraspinal and other soft tissues: Enlarged appearance of the right kidney with abnormal surrounding signal is new compared to prior study. Disc levels: L1-L2: Small disc bulge. No spinal canal stenosis. No neural foraminal stenosis. L2-L3: Disc space narrowing with mild endplate spurring. No spinal canal stenosis. No neural foraminal stenosis. L3-L4: Posterior instrumented fusion. Small central protrusion. Right lateral recess narrowing  without central spinal canal stenosis. No neural foraminal stenosis. L4-L5: Small disc bulge and endplate spurring. No spinal canal stenosis. No neural foraminal stenosis. L5-S1: Disc space narrowing with mild endplate spurring. No spinal canal stenosis. No neural foraminal stenosis. Visualized  sacrum: Normal. IMPRESSION: 1. No evidence of discitis-osteomyelitis. 2. Incomplete burst fracture of L4 with 50% height loss and 2 mm retropulsion. Mild edema throughout the bone marrow suggests that this fracture is subacute. 3. Posterior instrumented fusion at L3-L5 with right lateral recess narrowing at L3-L4. 4. Enlarged appearance of the right kidney with abnormal surrounding signal is new compared to prior study. Recommend further evaluation with CT of the abdomen and pelvis with contrast. Electronically Signed   By: Franky Stanford M.D.   On: 12/08/2023 19:41   CT CHEST WO CONTRAST Addendum Date: 12/08/2023 ADDENDUM REPORT: 12/08/2023 19:14 ADDENDUM: Critical Value/emergent results were called by telephone at the time of interpretation on 12/08/2023 at 7:11 pm to provider ALEXANDER MELVIN , who verbally acknowledged these results. Electronically Signed   By: Leita Birmingham M.D.   On: 12/08/2023 19:14   Result Date: 12/08/2023 CLINICAL DATA:  Respiratory illness, nondiagnostic x-ray. EXAM: CT CHEST WITHOUT CONTRAST TECHNIQUE: Multidetector CT imaging of the chest was performed following the standard protocol without IV contrast. RADIATION DOSE REDUCTION: This exam was performed according to the departmental dose-optimization program which includes automated exposure control, adjustment of the mA and/or kV according to patient size and/or use of iterative reconstruction technique. COMPARISON:  04/30/2023, 09/24/2023. FINDINGS: Cardiovascular: The heart is normal in size and there is a trace pericardial effusion. Three-vessel coronary artery calcifications are noted. There is atherosclerotic calcification aorta without  evidence of aneurysm. The pulmonary trunk is normal in caliber. Mediastinum/Nodes: A nonspecific prominent lymph node is present in the [place] off a GIA low fat measuring 1 cm. No axillary lymphadenopathy. Evaluation of the hila is limited due to lack of IV contrast. The trachea and esophagus are within normal limits. Lungs/Pleura: Centrilobular emphysematous changes are present in the lungs. Slightly hyperdense small pleural effusions are noted bilaterally, greater on the right than on the left. Atelectasis is noted bilaterally. A parenchymal band is noted in the posterior aspect of the left upper lobe and the previous area of nodularity is not well-defined, however measures 1.5 x 0.7 cm. No pneumothorax is seen. Examination is limited due to respiratory motion artifact. Upper Abdomen: The right kidney is enlarged with partially visualized surrounding hyperdense region which is new from the previous exam, query perinephric hematoma. Musculoskeletal: Degenerative changes are present in the thoracic spine. No acute fracture is seen. IMPRESSION: 1. Small hyperdense pleural effusions bilaterally, query small hemothorax versus infection. 2. Scattered atelectasis bilaterally. 3. Nodular region in the posterior aspect of the left upper lobe, not well delineated due to atelectasis and respiratory motion artifact and may be decreased in size. Non emergent repeat evaluation is recommended. 4. Enlargement of the right kidney with peripheral hyperdense material partially visualized on exam, concerning for perinephric hematoma. Dedicated CT with contrast is recommended for further evaluation. 5. Emphysema. 6. Coronary artery calcifications. 7. Aortic atherosclerosis. Electronically Signed: By: Leita Birmingham M.D. On: 12/08/2023 18:54   DG Lumbar Spine 2-3 Views Result Date: 11/20/2023 CLINICAL DATA:  Elective surgery. EXAM: LUMBAR SPINE - 2-3 VIEW COMPARISON:  CT 08/02/2023 FINDINGS: Two fluoroscopic spot views of the lumbar  spine submitted from the operating room. Posterior rod and pedicle screw fixation, L3 through L5 on the left and L3 and L5 on the right. Fluoroscopy time 1 minutes 51 seconds. Dose 73.52 mGy. IMPRESSION: Intraoperative fluoroscopy during lumbar fusion. Electronically Signed   By: Andrea Gasman M.D.   On: 11/20/2023 12:24   DG C-Arm 1-60 Min-No Report Result Date: 11/20/2023 Fluoroscopy was utilized  by the requesting physician.  No radiographic interpretation.   DG C-Arm 1-60 Min-No Report Result Date: 11/20/2023 Fluoroscopy was utilized by the requesting physician.  No radiographic interpretation.    Microbiology: Recent Results (from the past 240 hours)  Culture, blood (Routine X 2) w Reflex to ID Panel     Status: None (Preliminary result)   Collection Time: 12/11/23  7:49 AM   Specimen: BLOOD LEFT ARM  Result Value Ref Range Status   Specimen Description BLOOD LEFT ARM  Final   Special Requests   Final    BOTTLES DRAWN AEROBIC AND ANAEROBIC Blood Culture adequate volume   Culture   Final    NO GROWTH 3 DAYS Performed at [facility], [address] [place], [place] [address]    Report Status PENDING  Incomplete  Culture, blood (Routine X 2) w Reflex to ID Panel     Status: None (Preliminary result)   Collection Time: 12/11/23  7:56 AM   Specimen: BLOOD RIGHT ARM  Result Value Ref Range Status   Specimen Description BLOOD RIGHT ARM  Final   Special Requests   Final    BOTTLES DRAWN AEROBIC AND ANAEROBIC Blood Culture adequate volume   Culture   Final    NO GROWTH 3 DAYS Performed at [facility], [address], [place], [place] [address]    Report Status PENDING  Incomplete    Time spent: 45 minutes  Signed: Owen DELENA Lore, MD 12/19/23

## 2023-12-17 NOTE — Progress Notes (Signed)
 Nurse was called into room by family member assessed .Pt was not breathing.auscultated for heart beat no heart beat present. Called Charge Nurse to confirm Pt 's death. Pt's death confirmed by second Nurse. Patients time of death 76 am Family present Patient pronounced by Gailen Rubin RN and Darice Berber LPN.TCT honor bridge Pt will not be a candidate for tissue donation. called placed to  ME Dick Pinal who stated Pt will be an ME case.

## 2023-12-17 NOTE — Progress Notes (Signed)
    Patient Name: [name]           DOB: 1941/04/26  MRN: [id number]       OVERNIGHT EVENT    Notified by RN that patient has expired at 0529 .  2 RN verified. Patient was comfort care.   Family is at bedside.     Amiliana Foutz, DNP, ACNPC- AG [facility] [place]

## 2023-12-17 DEATH — deceased

## 2023-12-18 ENCOUNTER — Encounter

## 2024-02-12 ENCOUNTER — Ambulatory Visit: Admitting: Family Medicine
